# Patient Record
Sex: Male | Born: 1960 | Race: Black or African American | Hispanic: No | Marital: Single | Smoking: Current every day smoker
Health system: Southern US, Community
[De-identification: ages and names within clinical notes are randomized; demographics above are authoritative.]

## PROBLEM LIST (undated history)

## (undated) ENCOUNTER — Emergency Department (HOSPITAL_COMMUNITY): Discharge status: Absent without leave | Payer: Medicare Other

## (undated) DIAGNOSIS — S32009K Unspecified fracture of unspecified lumbar vertebra, subsequent encounter for fracture with nonunion: Secondary | ICD-10-CM

## (undated) DIAGNOSIS — E041 Nontoxic single thyroid nodule: Secondary | ICD-10-CM

## (undated) DIAGNOSIS — Z9889 Other specified postprocedural states: Secondary | ICD-10-CM

## (undated) DIAGNOSIS — M1990 Unspecified osteoarthritis, unspecified site: Secondary | ICD-10-CM

## (undated) DIAGNOSIS — E0590 Thyrotoxicosis, unspecified without thyrotoxic crisis or storm: Secondary | ICD-10-CM

## (undated) DIAGNOSIS — R112 Nausea with vomiting, unspecified: Secondary | ICD-10-CM

## (undated) DIAGNOSIS — M5412 Radiculopathy, cervical region: Secondary | ICD-10-CM

## (undated) DIAGNOSIS — F1911 Other psychoactive substance abuse, in remission: Secondary | ICD-10-CM

## (undated) DIAGNOSIS — M5416 Radiculopathy, lumbar region: Secondary | ICD-10-CM

## (undated) DIAGNOSIS — M4316 Spondylolisthesis, lumbar region: Secondary | ICD-10-CM

## (undated) DIAGNOSIS — H4050X0 Glaucoma secondary to other eye disorders, unspecified eye, stage unspecified: Secondary | ICD-10-CM

## (undated) DIAGNOSIS — F32A Depression, unspecified: Secondary | ICD-10-CM

## (undated) DIAGNOSIS — N410 Acute prostatitis: Secondary | ICD-10-CM

## (undated) DIAGNOSIS — I639 Cerebral infarction, unspecified: Secondary | ICD-10-CM

## (undated) DIAGNOSIS — F4321 Adjustment disorder with depressed mood: Secondary | ICD-10-CM

## (undated) DIAGNOSIS — I959 Hypotension, unspecified: Secondary | ICD-10-CM

## (undated) DIAGNOSIS — M169 Osteoarthritis of hip, unspecified: Secondary | ICD-10-CM

## (undated) DIAGNOSIS — C61 Malignant neoplasm of prostate: Secondary | ICD-10-CM

## (undated) DIAGNOSIS — M4802 Spinal stenosis, cervical region: Secondary | ICD-10-CM

## (undated) DIAGNOSIS — H548 Legal blindness, as defined in USA: Secondary | ICD-10-CM

## (undated) DIAGNOSIS — Z973 Presence of spectacles and contact lenses: Secondary | ICD-10-CM

## (undated) DIAGNOSIS — M1710 Unilateral primary osteoarthritis, unspecified knee: Secondary | ICD-10-CM

## (undated) DIAGNOSIS — H269 Unspecified cataract: Secondary | ICD-10-CM

## (undated) DIAGNOSIS — E785 Hyperlipidemia, unspecified: Secondary | ICD-10-CM

## (undated) DIAGNOSIS — I10 Essential (primary) hypertension: Secondary | ICD-10-CM

## (undated) DIAGNOSIS — H409 Unspecified glaucoma: Secondary | ICD-10-CM

## (undated) DIAGNOSIS — K219 Gastro-esophageal reflux disease without esophagitis: Secondary | ICD-10-CM

## (undated) DIAGNOSIS — I700 Atherosclerosis of aorta: Secondary | ICD-10-CM

## (undated) DIAGNOSIS — G8929 Other chronic pain: Secondary | ICD-10-CM

## (undated) DIAGNOSIS — Z972 Presence of dental prosthetic device (complete) (partial): Secondary | ICD-10-CM

## (undated) DIAGNOSIS — J302 Other seasonal allergic rhinitis: Secondary | ICD-10-CM

## (undated) HISTORY — DX: Spinal stenosis, cervical region: M48.02

## (undated) HISTORY — DX: Cerebral infarction, unspecified: I63.9

## (undated) HISTORY — DX: Osteoarthritis of hip, unspecified: M16.9

## (undated) HISTORY — DX: Nontoxic single thyroid nodule: E04.1

## (undated) HISTORY — DX: Hypotension, unspecified: I95.9

## (undated) HISTORY — DX: Unspecified fracture of unspecified lumbar vertebra, subsequent encounter for fracture with nonunion: S32.009K

## (undated) HISTORY — DX: Thyrotoxicosis, unspecified without thyrotoxic crisis or storm: E05.90

## (undated) HISTORY — DX: Hyperlipidemia, unspecified: E78.5

## (undated) HISTORY — DX: Adjustment disorder with depressed mood: F43.21

## (undated) HISTORY — DX: Unilateral primary osteoarthritis, unspecified knee: M17.10

## (undated) HISTORY — DX: Radiculopathy, cervical region: M54.12

## (undated) HISTORY — DX: Essential (primary) hypertension: I10

## (undated) HISTORY — PX: HAND SURGERY: SHX662

## (undated) HISTORY — DX: Unspecified cataract: H26.9

## (undated) HISTORY — DX: Acute prostatitis: N41.0

## (undated) HISTORY — PX: POSTERIOR LUMBAR FUSION: SHX6036

## (undated) HISTORY — DX: Glaucoma secondary to other eye disorders, unspecified eye, stage unspecified: H40.50X0

## (undated) HISTORY — PX: COLONOSCOPY: SHX174

## (undated) HISTORY — DX: Other psychoactive substance abuse, in remission: F19.11

---

## 1999-06-19 ENCOUNTER — Emergency Department (HOSPITAL_COMMUNITY): Admission: EM | Admit: 1999-06-19 | Discharge: 1999-06-19 | Payer: Self-pay | Admitting: Emergency Medicine

## 1999-11-23 ENCOUNTER — Emergency Department (HOSPITAL_COMMUNITY): Admission: EM | Admit: 1999-11-23 | Discharge: 1999-11-23 | Payer: Self-pay | Admitting: Emergency Medicine

## 1999-11-23 ENCOUNTER — Encounter: Payer: Self-pay | Admitting: Emergency Medicine

## 2000-08-16 ENCOUNTER — Emergency Department (HOSPITAL_COMMUNITY): Admission: EM | Admit: 2000-08-16 | Discharge: 2000-08-16 | Payer: Self-pay | Admitting: Emergency Medicine

## 2000-08-19 ENCOUNTER — Emergency Department (HOSPITAL_COMMUNITY): Admission: EM | Admit: 2000-08-19 | Discharge: 2000-08-19 | Payer: Self-pay | Admitting: Emergency Medicine

## 2003-07-03 ENCOUNTER — Encounter: Payer: Self-pay | Admitting: Emergency Medicine

## 2003-07-03 ENCOUNTER — Emergency Department (HOSPITAL_COMMUNITY): Admission: EM | Admit: 2003-07-03 | Discharge: 2003-07-03 | Payer: Self-pay | Admitting: Emergency Medicine

## 2003-07-05 ENCOUNTER — Encounter: Payer: Self-pay | Admitting: Otolaryngology

## 2003-07-05 ENCOUNTER — Inpatient Hospital Stay (HOSPITAL_COMMUNITY): Admission: AD | Admit: 2003-07-05 | Discharge: 2003-07-11 | Payer: Self-pay | Admitting: Otolaryngology

## 2003-07-07 ENCOUNTER — Encounter: Payer: Self-pay | Admitting: Otolaryngology

## 2003-07-08 ENCOUNTER — Encounter (INDEPENDENT_AMBULATORY_CARE_PROVIDER_SITE_OTHER): Payer: Self-pay | Admitting: Specialist

## 2003-07-08 ENCOUNTER — Encounter: Payer: Self-pay | Admitting: Otolaryngology

## 2003-07-11 ENCOUNTER — Encounter: Payer: Self-pay | Admitting: Otolaryngology

## 2006-09-13 HISTORY — PX: OTHER SURGICAL HISTORY: SHX169

## 2006-10-10 ENCOUNTER — Ambulatory Visit: Payer: Self-pay | Admitting: Internal Medicine

## 2006-10-10 ENCOUNTER — Inpatient Hospital Stay (HOSPITAL_COMMUNITY): Admission: EM | Admit: 2006-10-10 | Discharge: 2006-10-14 | Payer: Self-pay | Admitting: Pediatrics

## 2006-12-18 ENCOUNTER — Emergency Department (HOSPITAL_COMMUNITY): Admission: EM | Admit: 2006-12-18 | Discharge: 2006-12-18 | Payer: Self-pay | Admitting: Emergency Medicine

## 2007-08-03 ENCOUNTER — Ambulatory Visit: Payer: Self-pay | Admitting: Internal Medicine

## 2007-08-20 ENCOUNTER — Ambulatory Visit: Payer: Self-pay | Admitting: Internal Medicine

## 2007-08-20 ENCOUNTER — Encounter (INDEPENDENT_AMBULATORY_CARE_PROVIDER_SITE_OTHER): Payer: Self-pay | Admitting: Nurse Practitioner

## 2007-08-20 ENCOUNTER — Encounter: Payer: Self-pay | Admitting: Internal Medicine

## 2007-08-20 LAB — CONVERTED CEMR LAB
ALT: 9 units/L (ref 0–53)
AST: 12 units/L (ref 0–37)
BUN: 12 mg/dL (ref 6–23)
Basophils Absolute: 0 10*3/uL (ref 0.0–0.1)
Basophils Relative: 0 % (ref 0–1)
Calcium: 9.7 mg/dL (ref 8.4–10.5)
Creatinine, Ser: 0.99 mg/dL (ref 0.40–1.50)
Eosinophils Relative: 4 % (ref 0–5)
HCT: 47.5 % (ref 39.0–52.0)
Hemoglobin: 15.4 g/dL (ref 13.0–17.0)
MCHC: 32.4 g/dL (ref 30.0–36.0)
MCV: 97.9 fL (ref 78.0–100.0)
Monocytes Absolute: 0.6 10*3/uL (ref 0.2–0.7)
Monocytes Relative: 11 % (ref 3–11)
RBC: 4.85 M/uL (ref 4.22–5.81)
RDW: 12.9 % (ref 11.5–14.0)
TSH: 0.279 microintl units/mL — ABNORMAL LOW (ref 0.350–5.50)
Total Bilirubin: 0.5 mg/dL (ref 0.3–1.2)

## 2007-08-21 ENCOUNTER — Ambulatory Visit: Payer: Self-pay | Admitting: *Deleted

## 2007-08-21 ENCOUNTER — Ambulatory Visit (HOSPITAL_COMMUNITY): Admission: RE | Admit: 2007-08-21 | Discharge: 2007-08-21 | Payer: Self-pay | Admitting: Nurse Practitioner

## 2007-08-21 ENCOUNTER — Encounter: Payer: Self-pay | Admitting: Internal Medicine

## 2007-08-24 ENCOUNTER — Encounter (INDEPENDENT_AMBULATORY_CARE_PROVIDER_SITE_OTHER): Payer: Self-pay | Admitting: Nurse Practitioner

## 2007-08-24 LAB — CONVERTED CEMR LAB
Free Thyroxine Index: 2.7 (ref 1.0–3.9)
T3 Uptake Ratio: 49.5 % — ABNORMAL HIGH (ref 22.5–37.0)
T4, Total: 5.5 ug/dL (ref 5.0–12.5)

## 2007-09-14 DIAGNOSIS — F4321 Adjustment disorder with depressed mood: Secondary | ICD-10-CM

## 2007-09-14 HISTORY — DX: Adjustment disorder with depressed mood: F43.21

## 2007-11-12 ENCOUNTER — Encounter: Payer: Self-pay | Admitting: Internal Medicine

## 2007-11-12 ENCOUNTER — Ambulatory Visit: Payer: Self-pay | Admitting: Family Medicine

## 2007-11-12 ENCOUNTER — Encounter (INDEPENDENT_AMBULATORY_CARE_PROVIDER_SITE_OTHER): Payer: Self-pay | Admitting: Nurse Practitioner

## 2008-01-12 ENCOUNTER — Encounter (INDEPENDENT_AMBULATORY_CARE_PROVIDER_SITE_OTHER): Payer: Self-pay | Admitting: Nurse Practitioner

## 2008-01-12 ENCOUNTER — Encounter: Payer: Self-pay | Admitting: Internal Medicine

## 2008-01-12 ENCOUNTER — Ambulatory Visit: Payer: Self-pay | Admitting: Family Medicine

## 2008-01-12 LAB — CONVERTED CEMR LAB
ALT: 14 units/L (ref 0–53)
AST: 15 units/L (ref 0–37)
Albumin: 4.5 g/dL (ref 3.5–5.2)
Alkaline Phosphatase: 102 units/L (ref 39–117)
BUN: 13 mg/dL (ref 6–23)
Basophils Absolute: 0 10*3/uL (ref 0.0–0.1)
Basophils Relative: 0 % (ref 0–1)
Calcium: 9.1 mg/dL (ref 8.4–10.5)
Chloride: 104 meq/L (ref 96–112)
Eosinophils Absolute: 0.4 10*3/uL (ref 0.0–0.7)
MCHC: 32.8 g/dL (ref 30.0–36.0)
MCV: 96.6 fL (ref 78.0–100.0)
Monocytes Relative: 11 % (ref 3–12)
Neutro Abs: 3.5 10*3/uL (ref 1.7–7.7)
Neutrophils Relative %: 55 % (ref 43–77)
Platelets: 302 10*3/uL (ref 150–400)
Potassium: 5 meq/L (ref 3.5–5.3)
RBC: 4.76 M/uL (ref 4.22–5.81)
Sodium: 142 meq/L (ref 135–145)
Total Protein: 7.6 g/dL (ref 6.0–8.3)
WBC: 6.3 10*3/uL (ref 4.0–10.5)

## 2008-03-15 ENCOUNTER — Encounter (INDEPENDENT_AMBULATORY_CARE_PROVIDER_SITE_OTHER): Payer: Self-pay | Admitting: Nurse Practitioner

## 2008-03-15 ENCOUNTER — Ambulatory Visit: Payer: Self-pay | Admitting: Internal Medicine

## 2008-05-26 ENCOUNTER — Ambulatory Visit: Payer: Self-pay | Admitting: Internal Medicine

## 2008-08-30 ENCOUNTER — Ambulatory Visit: Payer: Self-pay | Admitting: Internal Medicine

## 2008-08-30 DIAGNOSIS — F4321 Adjustment disorder with depressed mood: Secondary | ICD-10-CM | POA: Insufficient documentation

## 2008-08-30 DIAGNOSIS — M5412 Radiculopathy, cervical region: Secondary | ICD-10-CM | POA: Insufficient documentation

## 2008-08-30 DIAGNOSIS — H4050X0 Glaucoma secondary to other eye disorders, unspecified eye, stage unspecified: Secondary | ICD-10-CM

## 2008-08-30 DIAGNOSIS — I10 Essential (primary) hypertension: Secondary | ICD-10-CM

## 2008-08-30 HISTORY — DX: Radiculopathy, cervical region: M54.12

## 2008-08-30 HISTORY — DX: Essential (primary) hypertension: I10

## 2008-08-30 HISTORY — DX: Glaucoma secondary to other eye disorders, unspecified eye, stage unspecified: H40.50X0

## 2008-08-30 LAB — CONVERTED CEMR LAB
Alkaline Phosphatase: 91 units/L (ref 39–117)
Basophils Absolute: 0 10*3/uL (ref 0.0–0.1)
Bilirubin Urine: NEGATIVE
Bilirubin, Direct: 0.1 mg/dL (ref 0.0–0.3)
CO2: 32 meq/L (ref 19–32)
Calcium: 9.3 mg/dL (ref 8.4–10.5)
GFR calc Af Amer: 103 mL/min
HDL: 44.3 mg/dL (ref >39.0)
Hemoglobin, Urine: NEGATIVE
Hemoglobin: 14.2 g/dL (ref 13.0–17.0)
Ketones, ur: NEGATIVE mg/dL
LDL Cholesterol: 97 mg/dL (ref 0–99)
Leukocytes, UA: NEGATIVE
Lymphocytes Relative: 37.1 % (ref 12.0–46.0)
MCHC: 33.4 g/dL (ref 30.0–36.0)
Monocytes Absolute: 0.6 10*3/uL (ref 0.1–1.0)
Neutro Abs: 2.3 10*3/uL (ref 1.4–7.7)
Nitrite: NEGATIVE
Platelets: 304 10*3/uL (ref 150–400)
Potassium: 4.3 meq/L (ref 3.5–5.1)
RDW: 14.1 % (ref 11.5–14.6)
Sodium: 141 meq/L (ref 135–145)
TSH: 0.55 microintl units/mL (ref 0.35–5.50)
Total Bilirubin: 0.5 mg/dL (ref 0.3–1.2)
Total CHOL/HDL Ratio: 3.6
Triglycerides: 84 mg/dL (ref 0–149)
VLDL: 17 mg/dL (ref 0–40)
pH: 5.5 (ref 5.0–8.0)

## 2008-10-14 DIAGNOSIS — Z87438 Personal history of other diseases of male genital organs: Secondary | ICD-10-CM

## 2008-10-14 HISTORY — DX: Personal history of other diseases of male genital organs: Z87.438

## 2009-01-30 ENCOUNTER — Ambulatory Visit: Payer: Self-pay | Admitting: Internal Medicine

## 2009-01-30 DIAGNOSIS — M25569 Pain in unspecified knee: Secondary | ICD-10-CM | POA: Insufficient documentation

## 2009-01-30 DIAGNOSIS — K1370 Unspecified lesions of oral mucosa: Secondary | ICD-10-CM

## 2009-05-07 ENCOUNTER — Emergency Department (HOSPITAL_COMMUNITY): Admission: EM | Admit: 2009-05-07 | Discharge: 2009-05-07 | Payer: Self-pay | Admitting: Emergency Medicine

## 2009-05-09 ENCOUNTER — Ambulatory Visit: Payer: Self-pay | Admitting: Internal Medicine

## 2009-05-09 DIAGNOSIS — IMO0002 Reserved for concepts with insufficient information to code with codable children: Secondary | ICD-10-CM | POA: Insufficient documentation

## 2009-05-09 DIAGNOSIS — M25559 Pain in unspecified hip: Secondary | ICD-10-CM

## 2009-05-12 ENCOUNTER — Telehealth: Payer: Self-pay | Admitting: Internal Medicine

## 2009-05-15 ENCOUNTER — Encounter: Admission: RE | Admit: 2009-05-15 | Discharge: 2009-05-15 | Payer: Self-pay | Admitting: Internal Medicine

## 2009-05-16 ENCOUNTER — Telehealth: Payer: Self-pay | Admitting: Internal Medicine

## 2009-05-17 ENCOUNTER — Telehealth: Payer: Self-pay | Admitting: Internal Medicine

## 2009-05-23 ENCOUNTER — Telehealth: Payer: Self-pay | Admitting: Internal Medicine

## 2009-05-23 ENCOUNTER — Ambulatory Visit: Payer: Self-pay | Admitting: Internal Medicine

## 2009-05-23 DIAGNOSIS — N410 Acute prostatitis: Secondary | ICD-10-CM

## 2009-05-23 HISTORY — DX: Acute prostatitis: N41.0

## 2009-05-23 LAB — CONVERTED CEMR LAB
Nitrite: NEGATIVE
Specific Gravity, Urine: 1.01 (ref 1.000–1.030)
pH: 8 (ref 5.0–8.0)

## 2009-05-24 ENCOUNTER — Encounter: Payer: Self-pay | Admitting: Internal Medicine

## 2009-06-07 ENCOUNTER — Encounter: Admission: RE | Admit: 2009-06-07 | Discharge: 2009-09-05 | Payer: Self-pay | Admitting: Orthopaedic Surgery

## 2009-07-31 ENCOUNTER — Ambulatory Visit: Payer: Self-pay | Admitting: Internal Medicine

## 2009-07-31 DIAGNOSIS — M549 Dorsalgia, unspecified: Secondary | ICD-10-CM | POA: Insufficient documentation

## 2009-08-01 ENCOUNTER — Encounter (INDEPENDENT_AMBULATORY_CARE_PROVIDER_SITE_OTHER): Payer: Self-pay | Admitting: *Deleted

## 2009-08-01 ENCOUNTER — Telehealth: Payer: Self-pay | Admitting: Internal Medicine

## 2009-08-04 ENCOUNTER — Encounter (INDEPENDENT_AMBULATORY_CARE_PROVIDER_SITE_OTHER): Payer: Self-pay | Admitting: *Deleted

## 2009-08-07 ENCOUNTER — Encounter: Admission: RE | Admit: 2009-08-07 | Discharge: 2009-08-07 | Payer: Self-pay | Admitting: Internal Medicine

## 2009-08-08 ENCOUNTER — Encounter: Payer: Self-pay | Admitting: Internal Medicine

## 2009-08-08 DIAGNOSIS — M4802 Spinal stenosis, cervical region: Secondary | ICD-10-CM

## 2009-08-08 HISTORY — DX: Spinal stenosis, cervical region: M48.02

## 2009-10-14 DIAGNOSIS — Z8639 Personal history of other endocrine, nutritional and metabolic disease: Secondary | ICD-10-CM

## 2009-10-14 HISTORY — DX: Personal history of other endocrine, nutritional and metabolic disease: Z86.39

## 2009-10-31 ENCOUNTER — Ambulatory Visit: Payer: Self-pay | Admitting: Internal Medicine

## 2009-10-31 DIAGNOSIS — I959 Hypotension, unspecified: Secondary | ICD-10-CM

## 2009-10-31 HISTORY — DX: Hypotension, unspecified: I95.9

## 2009-11-01 ENCOUNTER — Telehealth: Payer: Self-pay | Admitting: Internal Medicine

## 2009-11-01 LAB — CONVERTED CEMR LAB
Basophils Relative: 0.2 % (ref 0.0–3.0)
CO2: 31 meq/L (ref 19–32)
Calcium: 9.7 mg/dL (ref 8.4–10.5)
Creatinine, Ser: 1.4 mg/dL (ref 0.4–1.5)
Eosinophils Absolute: 0.3 10*3/uL (ref 0.0–0.7)
GFR calc non Af Amer: 69.06 mL/min (ref >60)
Lymphocytes Relative: 32.7 % (ref 12.0–46.0)
MCHC: 32.7 g/dL (ref 30.0–36.0)
Neutrophils Relative %: 53.8 % (ref 43.0–77.0)
RBC: 4.41 M/uL (ref 4.22–5.81)
WBC: 6.7 10*3/uL (ref 4.5–10.5)

## 2009-11-16 ENCOUNTER — Telehealth: Payer: Self-pay | Admitting: Internal Medicine

## 2009-11-17 ENCOUNTER — Telehealth: Payer: Self-pay | Admitting: Internal Medicine

## 2009-11-21 ENCOUNTER — Telehealth (INDEPENDENT_AMBULATORY_CARE_PROVIDER_SITE_OTHER): Payer: Self-pay | Admitting: *Deleted

## 2010-01-23 ENCOUNTER — Encounter: Payer: Self-pay | Admitting: Internal Medicine

## 2010-02-09 ENCOUNTER — Telehealth: Payer: Self-pay | Admitting: Internal Medicine

## 2010-02-09 ENCOUNTER — Encounter (INDEPENDENT_AMBULATORY_CARE_PROVIDER_SITE_OTHER): Payer: Self-pay | Admitting: *Deleted

## 2010-02-09 ENCOUNTER — Ambulatory Visit: Payer: Self-pay | Admitting: Internal Medicine

## 2010-02-09 LAB — CONVERTED CEMR LAB
ALT: 11 units/L (ref 0–53)
Albumin: 3.7 g/dL (ref 3.5–5.2)
BUN: 10 mg/dL (ref 6–23)
Basophils Relative: 0.6 % (ref 0.0–3.0)
CO2: 31 meq/L (ref 19–32)
Chloride: 103 meq/L (ref 96–112)
Cholesterol: 128 mg/dL (ref 0–200)
Creatinine, Ser: 1 mg/dL (ref 0.4–1.5)
Eosinophils Absolute: 0.3 10*3/uL (ref 0.0–0.7)
Glucose, Bld: 82 mg/dL (ref 70–99)
Hemoglobin, Urine: NEGATIVE
Hemoglobin: 13.4 g/dL (ref 13.0–17.0)
Lymphocytes Relative: 22.1 % (ref 12.0–46.0)
MCHC: 32.9 g/dL (ref 30.0–36.0)
Monocytes Relative: 11 % (ref 3.0–12.0)
Neutro Abs: 3.6 10*3/uL (ref 1.4–7.7)
Nitrite: NEGATIVE
RBC: 4.38 M/uL (ref 4.22–5.81)
Total Protein, Urine: NEGATIVE mg/dL
Total Protein: 7.1 g/dL (ref 6.0–8.3)
Triglycerides: 59 mg/dL (ref 0.0–149.0)
pH: 7 (ref 5.0–8.0)

## 2010-02-13 ENCOUNTER — Ambulatory Visit: Payer: Self-pay | Admitting: Internal Medicine

## 2010-02-13 ENCOUNTER — Encounter (INDEPENDENT_AMBULATORY_CARE_PROVIDER_SITE_OTHER): Payer: Self-pay | Admitting: *Deleted

## 2010-02-13 DIAGNOSIS — E0590 Thyrotoxicosis, unspecified without thyrotoxic crisis or storm: Secondary | ICD-10-CM | POA: Insufficient documentation

## 2010-02-13 HISTORY — DX: Thyrotoxicosis, unspecified without thyrotoxic crisis or storm: E05.90

## 2010-02-20 ENCOUNTER — Telehealth (INDEPENDENT_AMBULATORY_CARE_PROVIDER_SITE_OTHER): Payer: Self-pay | Admitting: *Deleted

## 2010-02-26 ENCOUNTER — Encounter (INDEPENDENT_AMBULATORY_CARE_PROVIDER_SITE_OTHER): Payer: Self-pay | Admitting: *Deleted

## 2010-03-01 ENCOUNTER — Encounter (INDEPENDENT_AMBULATORY_CARE_PROVIDER_SITE_OTHER): Payer: Self-pay | Admitting: *Deleted

## 2010-03-02 ENCOUNTER — Ambulatory Visit: Payer: Self-pay | Admitting: Endocrinology

## 2010-03-02 DIAGNOSIS — E041 Nontoxic single thyroid nodule: Secondary | ICD-10-CM

## 2010-03-02 HISTORY — DX: Nontoxic single thyroid nodule: E04.1

## 2010-03-05 ENCOUNTER — Ambulatory Visit: Payer: Self-pay | Admitting: Gastroenterology

## 2010-03-14 ENCOUNTER — Ambulatory Visit: Payer: Self-pay | Admitting: Gastroenterology

## 2010-03-15 ENCOUNTER — Encounter: Payer: Self-pay | Admitting: Gastroenterology

## 2010-05-21 ENCOUNTER — Telehealth (INDEPENDENT_AMBULATORY_CARE_PROVIDER_SITE_OTHER): Payer: Self-pay | Admitting: *Deleted

## 2010-05-21 ENCOUNTER — Ambulatory Visit: Payer: Self-pay | Admitting: Internal Medicine

## 2010-05-30 ENCOUNTER — Encounter (HOSPITAL_COMMUNITY): Admission: RE | Admit: 2010-05-30 | Discharge: 2010-07-11 | Payer: Self-pay | Admitting: Endocrinology

## 2010-06-20 ENCOUNTER — Telehealth: Payer: Self-pay | Admitting: Endocrinology

## 2010-06-20 ENCOUNTER — Encounter (INDEPENDENT_AMBULATORY_CARE_PROVIDER_SITE_OTHER): Payer: Self-pay | Admitting: *Deleted

## 2010-06-20 ENCOUNTER — Ambulatory Visit (HOSPITAL_COMMUNITY): Admission: RE | Admit: 2010-06-20 | Discharge: 2010-06-20 | Payer: Self-pay | Admitting: Endocrinology

## 2010-07-03 ENCOUNTER — Ambulatory Visit: Payer: Self-pay | Admitting: Endocrinology

## 2010-07-03 DIAGNOSIS — R109 Unspecified abdominal pain: Secondary | ICD-10-CM | POA: Insufficient documentation

## 2010-07-03 LAB — CONVERTED CEMR LAB
ALT: 11 units/L (ref 0–53)
Amylase: 99 units/L (ref 27–131)
BUN: 16 mg/dL (ref 6–23)
Basophils Absolute: 0 10*3/uL (ref 0.0–0.1)
Bilirubin, Direct: 0.1 mg/dL (ref 0.0–0.3)
Chloride: 98 meq/L (ref 96–112)
Creatinine, Ser: 1.1 mg/dL (ref 0.4–1.5)
Eosinophils Absolute: 0.4 10*3/uL (ref 0.0–0.7)
Eosinophils Relative: 5.8 % — ABNORMAL HIGH (ref 0.0–5.0)
Glucose, Bld: 82 mg/dL (ref 70–99)
MCV: 91.7 fL (ref 78.0–100.0)
Monocytes Absolute: 0.9 10*3/uL (ref 0.1–1.0)
Neutrophils Relative %: 57.2 % (ref 43.0–77.0)
Platelets: 417 10*3/uL — ABNORMAL HIGH (ref 150.0–400.0)
RDW: 14.9 % — ABNORMAL HIGH (ref 11.5–14.6)
TSH: 0.1 microintl units/mL — ABNORMAL LOW (ref 0.35–5.50)
Total Bilirubin: 0.4 mg/dL (ref 0.3–1.2)
WBC: 6.9 10*3/uL (ref 4.5–10.5)

## 2010-07-17 ENCOUNTER — Telehealth: Payer: Self-pay | Admitting: Internal Medicine

## 2010-08-02 ENCOUNTER — Telehealth: Payer: Self-pay | Admitting: Endocrinology

## 2010-08-02 ENCOUNTER — Ambulatory Visit: Payer: Self-pay | Admitting: Endocrinology

## 2010-08-02 LAB — CONVERTED CEMR LAB
Free T4: 1.25 ng/dL (ref 0.60–1.60)
TSH: 0.04 microintl units/mL — ABNORMAL LOW (ref 0.35–5.50)

## 2010-08-03 ENCOUNTER — Encounter (INDEPENDENT_AMBULATORY_CARE_PROVIDER_SITE_OTHER): Payer: Self-pay | Admitting: *Deleted

## 2010-08-03 ENCOUNTER — Telehealth: Payer: Self-pay | Admitting: Endocrinology

## 2010-10-11 ENCOUNTER — Telehealth: Payer: Self-pay | Admitting: Internal Medicine

## 2010-10-18 ENCOUNTER — Ambulatory Visit: Admit: 2010-10-18 | Payer: Self-pay | Admitting: Endocrinology

## 2010-10-26 ENCOUNTER — Telehealth: Payer: Self-pay | Admitting: Endocrinology

## 2010-11-04 ENCOUNTER — Encounter: Payer: Self-pay | Admitting: Internal Medicine

## 2010-11-15 NOTE — Progress Notes (Signed)
Summary: Records request from [facility]  Request for records received from [facility]. Request forwarded to [facility]. Mark Dunlap  November 21, 2009 12:14 PM

## 2010-11-15 NOTE — Letter (Signed)
Summary: Med Hx forms/[facility]  Med Hx forms/[facility]   Imported By: Sherian Rein 01/29/2010 08:17:35  _____________________________________________________________________  External Attachment:    Type:   Image     Comment:   External Document

## 2010-11-15 NOTE — Progress Notes (Signed)
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 Initial call taken by: Margaret Pyle, CMA,  February 09, 2010 10:00 AM

## 2010-11-15 NOTE — Progress Notes (Signed)
Summary: med ?  Phone Note From Other Clinic Call back at [phone]   Caller: Dr. Chinita Greenland Psychiatrist Summary of Call: Dr. Chinita Greenland wants to know if she can increase pt's rx for Methimazole to 20mg  three times a day. Per her lab results, pt's TSH is 0.104 and T4 is 1.34 Initial call taken by: Brenton Grills CMA Duncan Dull),  October 26, 2010 11:51 AM  Follow-up for Phone Call        pt is due for f/u appt here.  can pt come here? Follow-up by: Minus Breeding MD,  October 26, 2010 12:14 PM  Additional Follow-up for Phone Call Additional follow up Details #1::        per mother, pt is in a psychiatric hosp. Additional Follow-up by: Brenton Grills CMA Duncan Dull),  October 31, 2010 11:55 AM    Additional Follow-up for Phone Call Additional follow up Details #2::    please increase.  f/u here after discharge Follow-up by: Minus Breeding MD,  October 31, 2010 12:21 PM  Additional Follow-up for Phone Call Additional follow up Details #3:: Details for Additional Follow-up Action Taken: pt's psychiatrist informed via detailed voicemail Additional Follow-up by: Brenton Grills CMA Duncan Dull),  November 01, 2010 8:26 AM

## 2010-11-15 NOTE — Letter (Signed)
Summary: Out of Work  [facility]  [address] [place], [place] [address]   Phone: [phone]  Fax: [phone]    June 20, 2010   Employee:  Mark Dunlap    To Whom It May Concern:   For Medical reasons, please excuse the above named employee from work for the following dates:  Start:   Wednesday Sept 7th 2011  End:   Sunday Sept 11th 2011 - To return Sept 12th 2011  If you need additional information, please feel free to contact our office.         Sincerely,    ______________________ Romero Belling MD

## 2010-11-15 NOTE — Progress Notes (Signed)
----   Converted from flag ---- ---- 02/19/2010 11:31 AM, Ivar Bury wrote: Gave pt appt:  03/02/10 @ 4p w/Dr Allie Dimmer  ---- 02/13/2010 11:20 AM, Dagoberto Reef wrote: Please schedule with Dr Everardo All.   Thanks ------------------------------

## 2010-11-15 NOTE — Assessment & Plan Note (Signed)
Summary: EC3/6WK FOLLOW UP/JSS   Vital Signs:  Patient profile:   50 year old male Height:      73.5 inches (186.69 cm) Weight:      160.25 pounds (72.84 kg) BMI:     20.93 O2 Sat:      99 % on Room air Temp:     97.4 degrees F (36.33 degrees C) oral Pulse rate:   82 / minute BP sitting:   114 / 78  (left arm) Cuff size:   regular  Vitals Entered By: Brenton Grills MA (July 03, 2010 4:30 PM)  O2 Flow:  Room air CC: 6 week F/U/pt c/o nausea and vomiting before and after radiation treatment for thyroid/aj   Primary Provider:  Corwin Levins MD  CC:  6 week F/U/pt c/o nausea and vomiting before and after radiation treatment for thyroid/aj.  History of Present Illness: pt is now 2 weeks s/p i-131 rx for hyperthyroidism, due to a multinodular goiter.  he says the thyroid is less prominent now.   pt states few weeks of slight "sensation," at the epigastric area, and assoc nausea.    Current Medications (verified): 1)  Lisinopril-Hydrochlorothiazide 20-25 Mg Tabs (Lisinopril-Hydrochlorothiazide) .[name].. 1 By Mouth Once Daily 2)  Naproxen 500 Mg Tabs (Naproxen) .[name].. 1 By Mouth Two Times A Day As Needed Pain 3)  Adult Aspirin Ec Low Strength 81 Mg Tbec (Aspirin) .[name].. 1 By Mouth Once Daily 4)  Hydrocodone-Acetaminophen 10-325 Mg Tabs (Hydrocodone-Acetaminophen) .[name].. 1 By Mouth Four Times Per Day As Needed - To Fill May 15, 2010 5)  Cetirizine Hcl 10 Mg Tabs (Cetirizine Hcl) .[name].. 1po Once Daily As Needed Allergies 6)  Fluticasone Propionate 50 Mcg/act Susp (Fluticasone Propionate) .... 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    Physical Exam  General:  normal appearance.   Neck:  no change in large right thyroid mass Abdomen:  abdomen is soft, nontender.  no hepatosplenomegaly.   not distended.  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 1 tab once daily  Other Orders: TLB-TSH (Thyroid Stimulating Hormone) (84443-TSH) TLB-T4 (Thyrox), Free [phone]) TLB-BMP (Basic Metabolic Panel-BMET) (80048-METABOL) TLB-CBC Platelet - w/Differential (85025-CBCD) TLB-Hepatic/Liver Function Pnl (80076-HEPATIC) TLB-Amylase (82150-AMYL) Est. Patient Level IV (40981)  Patient Instructions: 1)  blood tests are being ordered for you today.  please call [phone] to hear your test results. 2)  Please schedule a follow-up appointment in 1 month. 3)  try to minimize naproxen 4)  here are some samples of dexilant 60 mg once daily.   5)  please see dr Jonny Ruiz soon. 6)  (update: i left message on phone-tree:  rx as we discussed)

## 2010-11-15 NOTE — Procedures (Signed)
Summary: Colonoscopy  Patient: [name] Note: All result statuses are Final unless otherwise noted.  Tests: (1) Colonoscopy (COL)   COL Colonoscopy           DONE     [facility]     [address]     [place], [place]  [address]           COLONOSCOPY PROCEDURE REPORT           PATIENT:  Mark, Dunlap  MR#:  [id number]     BIRTHDATE:  [birth year], 49 yrs. old  GENDER:  male     ENDOSCOPIST:  Judie Petit T. Russella Dar, MD, [facility]     Referred by:  Oliver Barre, M.D.     PROCEDURE DATE:  03/14/2010     PROCEDURE:  Colonoscopy with snare polypectomy     ASA CLASS:  Class II     INDICATIONS:  1) Routine Risk Screening     MEDICATIONS:   Fentanyl 100 mcg IV, Versed 8 mg IV     DESCRIPTION OF PROCEDURE:   After the risks benefits and     alternatives of the procedure were thoroughly explained, informed     consent was obtained.  Digital rectal exam was performed and     revealed no abnormalities.   The LB [id number]-[id number] endoscope     was introduced through the anus and advanced to the cecum, which     was identified by both the appendix and ileocecal valve, without     limitations.  The quality of the prep was excellent, using     MoviPrep.  The instrument was then slowly withdrawn as the colon     was fully examined.     <<PROCEDUREIMAGES>>     FINDINGS:  A pedunculated polyp was found in the sigmoid colon. It     was 8 mm in size. Polyp was snared, then cauterized with monopolar     cautery. Retrieval was successful. snare polyp  A normal appearing     cecum, ileocecal valve, and appendiceal orifice were identified.     The ascending, hepatic flexure, transverse, splenic flexure,     descending colon, and rectum appeared unremarkable. Retroflexed     views in the rectum revealed no abnormalities.    The time to     cecum =  3  minutes. The scope was then withdrawn (time =  12.67     min) from the patient and the procedure completed.           COMPLICATIONS:  None             ENDOSCOPIC IMPRESSION:     1) 8 mm pedunculated polyp in the sigmoid colon           RECOMMENDATIONS:     1) No aspirin or NSAID's for 2 weeks     2) Await pathology results     3) If the polyp removed today is adenomatous (pre-cancerous),     you will need a repeat colonoscopy in 3-5 years. Otherwise you     should continue to follow colorectal cancer screening guidelines     for "routine risk" patients with colonoscopy in 10 years.     Venita Lick. Russella Dar, MD, [facility]           n.     eSIGNED:   Venita Lick. Yachet Mattson at 03/14/2010 10:27 AM           Janace Litten, [id number]  Note: An exclamation mark (!) indicates a result that was not dispersed into the flowsheet. Document Creation Date: 03/14/2010 10:28 AM _______________________________________________________________________  (1) Order result status: Final Collection or observation date-time: 03/14/2010 10:22 Requested date-time:  Receipt date-time:  Reported date-time:  Referring Physician:   Ordering Physician: Claudette Head [phone]) Specimen Source:  Source: Launa Grill Order Number: [id number] Lab site:   Appended Document: Colonoscopy     Procedures Next Due Date:    Colonoscopy: 03/2015

## 2010-11-15 NOTE — Miscellaneous (Signed)
Summary: screening colon/previsit/rm  Clinical Lists Changes  Medications: Added new medication of MOVIPREP 100 GM  SOLR (PEG-KCL-NACL-NASULF-NA ASC-C) As per prep instructions. - Signed Rx of MOVIPREP 100 GM  SOLR (PEG-KCL-NACL-NASULF-NA ASC-C) As per prep instructions.;  #1 x 0;  Signed;  Entered by: Sherren Kerns RN;  Authorized by: Meryl Dare MD [facility];  Method used: [facility]  [address] [phone]*, [address] [place], [place], [place]  [address], Ph: [phone] or [phone], Fax: [phone] Observations: Added new observation of ALLERGY REV: Done (03/05/2010 15:43)    Prescriptions: MOVIPREP 100 GM  SOLR (PEG-KCL-NACL-NASULF-NA ASC-C) As per prep instructions.  #1 x 0   Entered by:   Sherren Kerns RN   Authorized by:   Meryl Dare MD [facility]   Signed by:   Sherren Kerns RN on 03/05/2010   Method used:   [facility]        [facility] [address] [facility] [address] [phone]* (retail)       [address]       [place], [place]  [address]       Ph: [phone] or [phone]       Fax: [phone]   RxID:   [id number]

## 2010-11-15 NOTE — Letter (Signed)
Summary: Patient Notice- Polyp Results  [facility]  [address] [place], [place] [address]   Phone: [phone]  Fax: [phone]        March 15, 2010 MRN: [id number]    Mark Dunlap [address] [place], [place]  [address]    Dear Mr. Castrogiovanni,  I am pleased to inform you that the colon polyp(s) removed during your recent colonoscopy was (were) found to be benign (no cancer detected) upon pathologic examination.  I recommend you have a repeat colonoscopy examination in 5 years to look for recurrent polyps, as having colon polyps increases your risk for having recurrent polyps or even colon cancer in the future.  Should you develop new or worsening symptoms of abdominal pain, bowel habit changes or bleeding from the rectum or bowels, please schedule an evaluation with either your primary care physician or with me.  Continue treatment plan as outlined the day of your exam.  Please call us if you are having persistent problems or have questions about your condition that have not been fully answered at this time.  Sincerely,  Meryl Dare MD [facility]  This letter has been electronically signed by your physician.  Appended Document: Patient Notice- Polyp Results letter mailed.

## 2010-11-15 NOTE — Progress Notes (Signed)
  Phone Note Call from Patient Call back at Home Phone [phone]   Caller: [phone] Call For: Corwin Levins MD Summary of Call: Pt requesting sample of meds for nausea, to prevent from vomiting. Please advise. Initial call taken by: Verdell Face,  October 11, 2010 3:43 PM  Follow-up for Phone Call        sorry, there are no samples of phenergan or zofran here  I can do rx -   Pt should consider OV if not improved, or has fever, vomiting, abd pain , or any type of blood loss Follow-up by: Corwin Levins MD,  October 11, 2010 4:47 PM  Additional Follow-up for Phone Call Additional follow up Details #1::        Pt advised  Additional Follow-up by: Margaret Pyle, CMA,  October 11, 2010 5:01 PM    New/Updated Medications: PROMETHAZINE HCL 25 MG TABS (PROMETHAZINE HCL) 1 by mouth q 6 hrs as needed nausea Prescriptions: PROMETHAZINE HCL 25 MG TABS (PROMETHAZINE HCL) 1 by mouth q 6 hrs as needed nausea  #40 x 1   Entered and Authorized by:   Corwin Levins MD   Signed by:   Corwin Levins MD on 10/11/2010   Method used:   [facility]        [facility] [address] [phone]* (retail)       [address]       [place], [place]  [address]       Ph: [phone] or [phone]       Fax: [phone]   RxID:   [id number]

## 2010-11-15 NOTE — Letter (Signed)
Summary: Out of Work  [facility]  [address] [place], [place] [address]   Phone: [phone]  Fax: [phone]    Mar 02, 2010   Employee:  Mark Dunlap    To Whom It May Concern:   For Medical reasons, please excuse the above named employee from work for the following dates:  03/02/10, from 3 pm.     Sincerely,    Minus Breeding MD

## 2010-11-15 NOTE — Progress Notes (Signed)
  Phone Note Call from Patient Call back at Home Phone [phone]   Caller: Patient Summary of Call: Pt is requesting something to help him sleep throughout the night-please advise Initial call taken by: Brenton Grills MA,  August 02, 2010 4:48 PM  Follow-up for Phone Call        i have adressed this at [facility] today. Follow-up by: Minus Breeding MD,  August 02, 2010 5:11 PM

## 2010-11-15 NOTE — Assessment & Plan Note (Signed)
Summary: low bp/dr Jonny Ruiz pt/cd   Vital Signs:  Patient profile:   50 year old male Height:      73.5 inches (186.69 cm) Weight:      161.0 pounds (73.18 kg) O2 Sat:      98 % on Room air Temp:     98.3 degrees F (36.83 degrees C) oral Pulse rate:   102 / minute BP sitting:   88 / 58  (left arm) Cuff size:   regular  Vitals Entered By: Orlan Leavens RMA (May 21, 2010 1:53 PM)  O2 Flow:  Room air CC: Low BP Is Patient Diabetic? No Pain Assessment Patient in pain? no      Comments Pt states begin to feel dizzy at work. They checked BP 74/50 @ 11:50. Then check @ 12:30 it was 77/64. Pt also complaining of no appetite   Primary Care Provider:  Corwin Levins MD  CC:  Low BP.  History of Present Illness:  here today with complaint of low blood pressure readings onset of symptoms was this AM (<6 hours ago). course has been sudden onset and now occurs in persisitng pattern. symptom characterized as dizzy feeling every since waking up - problem associated with feeling light heded but not associated with syncope, HA, or CP. symptoms improved by sitting and resting. symptoms worsened with activity such as standing. + prior hx of same symptoms - seen by me for same 10/2009.  + taking narcs at this time - also daily NSAIDs for OA denies dehydration such as vomitting or diarrhea  - no black or liquid stools +"head cold" symptoms x 48h - drank whole bottle of robitussin in last 24h   Clinical Review Panels:  Lipid Management   Cholesterol:  128 (02/09/2010)   LDL (bad choesterol):  81 (02/09/2010)   HDL (good cholesterol):  34.90 (02/09/2010)  CBC   WBC:  6.0 (02/09/2010)   RBC:  4.38 (02/09/2010)   Hgb:  13.4 (02/09/2010)   Hct:  40.7 (02/09/2010)   Platelets:  363.0 (02/09/2010)   MCV  92.9 (02/09/2010)   MCHC  32.9 (02/09/2010)   RDW  13.9 (02/09/2010)   PMN:  60.7 (02/09/2010)   Lymphs:  22.1 (02/09/2010)   Monos:  11.0 (02/09/2010)   Eosinophils:  5.6 (02/09/2010)   Basophil:  0.6 (02/09/2010)  Complete Metabolic Panel   Glucose:  82 (02/09/2010)   Sodium:  139 (02/09/2010)   Potassium:  4.8 (02/09/2010)   Chloride:  103 (02/09/2010)   CO2:  31 (02/09/2010)   BUN:  10 (02/09/2010)   Creatinine:  1.0 (02/09/2010)   Albumin:  3.7 (02/09/2010)   Total Protein:  7.1 (02/09/2010)   Calcium:  9.5 (02/09/2010)   Total Bili:  0.8 (02/09/2010)   Alk Phos:  95 (02/09/2010)   SGPT (ALT):  11 (02/09/2010)   SGOT (AST):  15 (02/09/2010)   Current Medications (verified): 1)  Lisinopril-Hydrochlorothiazide 20-25 Mg Tabs (Lisinopril-Hydrochlorothiazide) .[name].. 1 By Mouth Once Daily 2)  Naproxen 500 Mg Tabs (Naproxen) .[name].. 1 By Mouth Two Times A Day As Needed Pain 3)  Adult Aspirin Ec Low Strength 81 Mg Tbec (Aspirin) .[name].. 1 By Mouth Once Daily 4)  Hydrocodone-Acetaminophen 10-325 Mg Tabs (Hydrocodone-Acetaminophen) .[name].. 1 By Mouth Four Times Per Day As Needed - To Fill May 15, 2010 5)  Cetirizine Hcl 10 Mg Tabs (Cetirizine Hcl) .[name].. 1po Once Daily As Needed Allergies 6)  Fluticasone Propionate 50 Mcg/act Susp (Fluticasone Propionate) .... 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    Physical Exam  General:  alert and well-developed but thin - nontoxic Lungs:  normal respiratory effort and normal breath sounds.   Heart:  normal rate and regular rhythm.  no edema Abdomen:  soft, non-tender, and normal bowel sounds.   Psych:  not depressed appearing and mildly anxious.     Impression & Recommendations:  Problem # 1:  HYPOTENSION (ICD-458.9) hx same - suspect overtx HTN in setting of URI - no CP, abd  pain, N/V or toxic findings on exam- no anemia on recent labs, denies melena or "red flags" on hx rec holding BP med x 72 h - aggressive oral hydration - also to use less pain pills as tol - narcs may also contrib to his hypotension to call if symptoms worse, sooner if probs - ok return to work in AM if dizzy symptoms improved -  Problem # 2:  HYPERTENSION (ICD-401.9)  His updated medication list for this problem includes:    Lisinopril-hydrochlorothiazide 20-25 Mg Tabs (Lisinopril-hydrochlorothiazide) .[name]... 1 by mouth once daily  BP today: 88/58 Prior BP: 136/90 (03/02/2010)  Labs Reviewed: K+: 4.8 (02/09/2010) Creat: : 1.0 (02/09/2010)   Chol: 128 (02/09/2010)   HDL: 34.90 (02/09/2010)   LDL: 81 (02/09/2010)   TG: 59.0 (02/09/2010) Time spent with patient 25 minutes, more than 50% of this time was spent counseling patient on hydration and effects of rx+otc meds as related to BP control  Complete Medication List: 1)  Lisinopril-hydrochlorothiazide 20-25 Mg Tabs (Lisinopril-hydrochlorothiazide) .[name].. 1 by mouth once daily 2)  Naproxen 500 Mg Tabs (Naproxen) .[name].. 1 by mouth two times a day as needed pain 3)  Adult Aspirin Ec Low Strength 81 Mg Tbec (Aspirin) .[name].. 1 by mouth once daily 4)  Hydrocodone-acetaminophen 10-325 Mg Tabs (Hydrocodone-acetaminophen) .[name].. 1 by mouth four times per day as needed - to fill May 15, 2010 5)  Cetirizine Hcl 10 Mg Tabs (Cetirizine hcl) .[name].. 1po once daily as needed allergies 6)  Fluticasone Propionate 50 Mcg/act Susp (Fluticasone propionate) .... 2 spray/side once daily  Patient Instructions: 1)  stop your blood pressure medicine for 3 days - may start again on Friday AM if you are feeling normal - 2)  ok to go back to work tomorrow if you are feeling better 3)  drink fluids tonight such as water or juice as discussed, no alcohol or caffiene 4)  use only 1/2 tab of your pain pill as needed - too much pain medication can lower your blood pressure 5)  if  continued symptoms of dizziness, call for re-evaluation with dr. Jonny Ruiz

## 2010-11-15 NOTE — Progress Notes (Signed)
Summary: work note  Phone Note Call from Patient   Caller: Patient---[phone] Call For: Dr Everardo All Summary of Call: Pt needs a note for work to say he was here to see Dr Everardo All. Pt was here at 10/20 @3 :45pm,. note needs to say that he came for appt this time. Initial call taken by: Verdell Face,  August 03, 2010 10:26 AM  Follow-up for Phone Call        pt informed work note in cabinet ready for pickup Follow-up by: Brenton Grills MA,  August 03, 2010 10:57 AM

## 2010-11-15 NOTE — Assessment & Plan Note (Signed)
Summary: 1 MO ROV /NWS  #   Vital Signs:  Patient profile:   50 year old male Height:      73.5 inches (186.69 cm) Weight:      155.50 pounds (70.68 kg) BMI:     20.31 O2 Sat:      98 % on Room air Temp:     98.5 degrees F (36.94 degrees C) oral Pulse rate:   85 / minute BP sitting:   122 / 78  (left arm) Cuff size:   regular  Vitals Entered By: Brenton Grills MA (August 02, 2010 4:48 PM)  O2 Flow:  Room air CC: 1 month F/U/aj Is Patient Diabetic? No   Primary Provider:  Corwin Levins MD  CC:  1 month F/U/aj.  History of Present Illness: pt is now 6 weeks s/p i-131 rx for hyperthyroidism, due to a multinodular goiter.  he is frustrated by the slow progress of the response to the i-131 rx. he says weight loss persists, and assoc insomomnia, but no tremor of the hands.   Current Medications (verified): 1)  Lisinopril-Hydrochlorothiazide 20-25 Mg Tabs (Lisinopril-Hydrochlorothiazide) .[name].. 1 By Mouth Once Daily 2)  Naproxen 500 Mg Tabs (Naproxen) .[name].. 1 By Mouth Two Times A Day As Needed Pain 3)  Adult Aspirin Ec Low Strength 81 Mg Tbec (Aspirin) .[name].. 1 By Mouth Once Daily 4)  Hydrocodone-Acetaminophen 10-325 Mg Tabs (Hydrocodone-Acetaminophen) .[name].. 1 By Mouth Four Times Per Day As Needed - To Fill May 15, 2010 5)  Cetirizine Hcl 10 Mg Tabs (Cetirizine Hcl) .[name].. 1po Once Daily As Needed Allergies 6)  Fluticasone Propionate 50 Mcg/act Susp (Fluticasone Propionate) .... 2 Spray/side Once Daily 7)  Dexilant 60 Mg Cpdr (Dexlansoprazole) .[name].. 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 Single no children work Administrator, sports for  the blind Current Smoker Alcohol use-no Drug use-no  Review of Systems  The patient denies fever.         denies n/v.    Physical Exam  General:  normal appearance.   Neck:  no change in large right thyroid mass   Impression & Recommendations:  Problem # 1:  weight loss prob due to #1  Problem # 2:  HYPERTHYROIDISM (ICD-242.90) i explained to pt the time factor of i-131 therapy.   Problem # 3:  insomnia prob due to or exac by #2  Medications Added to Medication List This Visit: 1)  Trazodone Hcl 150 Mg Tabs (Trazodone hcl) .[name].. 1 taqb at bedtime as needed for sleep 2)  Methimazole 10 Mg Tabs (Methimazole) .... 2 tabs two times a day  Other Orders: TLB-TSH (Thyroid Stimulating Hormone) (84443-TSH) TLB-T4 (Thyrox), Free [phone]) Est. Patient Level IV (40981)  Patient Instructions: 1)  blood tests are being ordered for you today.  please call [phone] to hear your test results. 2)  take trazodone 150 mg at bedtime as needed for sleep.   3)  Please schedule a follow-up appointment in 1 month. 4)  take methimazole 2x10 mg two times a day.  this is a faster-acting thyroid pill, and will work faster than the radioactive iodine pill.   5)  if ever you have fever while taking this medication, stop it and call us, because of the risk of a rare side-effect 6)  here are some samples of "nexium" (similar to dexilant--take once daily). Prescriptions: METHIMAZOLE 10 MG TABS (METHIMAZOLE) 2 tabs two times a day  #120 x 1   Entered and Authorized by:   Minus Breeding MD   Signed by:   Minus Breeding MD on 08/02/2010   Method used:   [facility]        [facility] [address] [phone]* (retail)       [address]       [place], [place]  [address]       Ph: [phone] or [phone]       Fax: [phone]   RxID:   [id number] TRAZODONE HCL 150 MG TABS (TRAZODONE HCL) 1 taqb at bedtime as needed for sleep  #30 x 2   Entered and Authorized by:    Minus Breeding MD   Signed by:   Minus Breeding MD on 08/02/2010   Method used:   [facility]        [facility]  [address] [phone]* (retail)       [address]       [place], [place]  [address]       Ph: [phone] or [phone]       Fax: [phone]   RxID:   [id number]    Orders Added: 1)  TLB-TSH (Thyroid Stimulating Hormone) [84443-TSH] 2)  TLB-T4 (Thyrox), Free [37628-BT5V] 3)  Est. Patient Level IV [76160]

## 2010-11-15 NOTE — Progress Notes (Signed)
  Phone Note Call from Patient   Caller: Patient [phone] cell Summary of Call: Pt called stating that he has been having trouble slepping and bought Advil PM. Pt wants to know if this is okay to take with HTN, thyroid disfunction and glucoma? Initial call taken by: Margaret Pyle, CMA,  July 17, 2010 9:35 AM  Follow-up for Phone Call        should be ok to use off and on, but not every night as this can in a few persons affect the kidney funciton adn possibly the HTN as well, besides the risk of stomach irritaiton as well  Follow-up by: Corwin Levins MD,  July 17, 2010 9:43 AM  Additional Follow-up for Phone Call Additional follow up Details #1::        Pt advised and states he will only use medication 3 qwk Additional Follow-up by: Margaret Pyle, CMA,  July 17, 2010 10:34 AM

## 2010-11-15 NOTE — Letter (Signed)
Summary: Moviprep Instructions  [facility]  [address]   [place], [place] [address]   Phone: [phone]  Fax: [phone]       Mark Dunlap    [birth year]    MRN: [id number]        Procedure Day Dorna Bloom: Wednesday, 03-14-10     Arrival Time: 8:30 a.m.      Procedure Time: 9:30 a.m.     Location of Procedure:                    x   [facility] (4th Floor)   PREPARATION FOR COLONOSCOPY WITH MOVIPREP   Starting 5 days prior to your procedure 03-09-10 do not eat nuts, seeds, popcorn, corn, beans, peas,  salads, or any raw vegetables.  Do not take any fiber supplements (e.g. Metamucil, Citrucel, and Benefiber).  THE DAY BEFORE YOUR PROCEDURE         DATE:  03-13-10   DAY: Tuesday  1.  Drink clear liquids the entire day-NO SOLID FOOD  2.  Do not drink anything colored red or purple.  Avoid juices with pulp.  No orange juice.  3.  Drink at least 64 oz. (8 glasses) of fluid/clear liquids during the day to prevent dehydration and help the prep work efficiently.  CLEAR LIQUIDS INCLUDE: Water Jello Ice Popsicles Tea (sugar ok, no milk/cream) Powdered fruit flavored drinks Coffee (sugar ok, no milk/cream) Gatorade Juice: apple, white grape, white cranberry  Lemonade Clear bullion, consomm, broth Carbonated beverages (any kind) Strained chicken noodle soup Hard Candy                             4.  In the morning, mix first dose of MoviPrep solution:    Empty 1 Pouch A and 1 Pouch B into the disposable container    Add lukewarm drinking water to the top line of the container. Mix to dissolve    Refrigerate (mixed solution should be used within 24 hrs)  5.  Begin drinking the prep at 5:00 p.m. The MoviPrep container is divided by 4 marks.   Every 15 minutes drink the solution down to the next mark (approximately 8 oz) until the full liter is complete.   6.  Follow completed prep with 16 oz of clear liquid of your choice (Nothing red or  purple).  Continue to drink clear liquids until bedtime.  7.  Before going to bed, mix second dose of MoviPrep solution:    Empty 1 Pouch A and 1 Pouch B into the disposable container    Add lukewarm drinking water to the top line of the container. Mix to dissolve    Refrigerate  THE DAY OF YOUR PROCEDURE      DATE: 03-14-10   DAY: Wednesday  Beginning at 4:30 a.m. (5 hours before procedure):         1. Every 15 minutes, drink the solution down to the next mark (approx 8 oz) until the full liter is complete.  2. Follow completed prep with 16 oz. of clear liquid of your choice.    3. You may drink clear liquids until 7:30 a.m.  (2 HOURS BEFORE PROCEDURE).   MEDICATION INSTRUCTIONS  Unless otherwise instructed, you should take regular prescription medications with a small sip of water   as early as possible the morning of your procedure.    Additional medication instructions:  hold Blood pressure pill morning of  procedure         OTHER INSTRUCTIONS  You will need a responsible adult at least 51 years of age to accompany you and drive you home.   This person must remain in the waiting room during your procedure.  Wear loose fitting clothing that is easily removed.  Leave jewelry and other valuables at home.  However, you may wish to bring a book to read or  an iPod/MP3 player to listen to music as you wait for your procedure to start.  Remove all body piercing jewelry and leave at home.  Total time from sign-in until discharge is approximately 2-3 hours.  You should go home directly after your procedure and rest.  You can resume normal activities the  day after your procedure.  The day of your procedure you should not:   Drive   Make legal decisions   Operate machinery   Drink alcohol   Return to work  You will receive specific instructions about eating, activities and medications before you leave.    The above instructions have been reviewed and  explained to me by   Sherren Kerns RN  Mar 05, 2010 4:24 PM    I fully understand and can verbalize these instructions _____________________________ Date _________

## 2010-11-15 NOTE — Letter (Signed)
Summary: [facility]  [address] [place], [place] [address]   Phone: [phone]  Fax: [phone]    Today's Date: February 09, 2010  Name of Patient: Mark Dunlap  The above named patient had a medical visit today at:  am / pm.  Please take this into consideration when reviewing the time away from work/school.    Special Instructions:  [ X ] None  [  ] To be off the remainder of today, returning to the normal work / school schedule tomorrow.  [  ] To be off until the next scheduled appointment on ______________________.  [  ] Other ________________________________________________________________ ________________________________________________________________________   Sincerely yours,   Margaret Pyle, CMA

## 2010-11-15 NOTE — Assessment & Plan Note (Signed)
Summary: CPX/ NWS  #   Vital Signs:  Patient profile:   50 year old male Height:      73.5 inches Weight:      157.50 pounds BMI:     20.57 O2 Sat:      97 % on Room air Temp:     98.2 degrees F oral Pulse rate:   82 / minute BP sitting:   102 / 66  (left arm) Cuff size:   regular  Vitals Entered ByZella Ball Ewing (Feb 13, 2010 10:28 AM)  O2 Flow:  Room air  CC: Adult Physical/RE   Primary Care Provider:  Corwin Levins MD  CC:  Adult Physical/RE.  History of Present Illness: wt down from 169 pk wt and not sure why,  no obvious hyperthyroid symtpoms.  Pt denies CP, sob, doe, wheezing, orthopnea, pnd, worsening LE edema, palps, dizziness or syncope  Pt denies new neuro symptoms such as headache, facial or extremity weakness  .  Pain overall controlled, no new complaints.    Preventive Screening-Counseling & Management      Drug Use:  no.    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 1 By Mouth Once Daily 2)  Naproxen 500 Mg Tabs (Naproxen) .[name].. 1 By Mouth Two Times A Day 3)  Adult Aspirin Ec Low Strength 81 Mg Tbec (Aspirin) .[name].. 1 By Mouth Once Daily 4)  Hydrocodone-Acetaminophen 10-325 Mg Tabs (Hydrocodone-Acetaminophen) .[name].. 1 By Mouth Four Times Per Day As  Needed  Current Medications (verified): 1)  Lisinopril-Hydrochlorothiazide 20-25 Mg Tabs (Lisinopril-Hydrochlorothiazide) .[name].. 1 By Mouth Once Daily 2)  Naproxen 500 Mg Tabs (Naproxen) .[name].. 1 By Mouth Two Times A Day As Needed Pain 3)  Adult Aspirin Ec Low Strength 81 Mg Tbec (Aspirin) .[name].. 1 By Mouth Once Daily 4)  Hydrocodone-Acetaminophen 10-325 Mg Tabs (Hydrocodone-Acetaminophen) .[name].. 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  hx of ETOH/crack cocaine - none since 11/08 per pt/Does Not Drive due to this hx of adjustment disorder 12/08 DJD left hip and left knee  Past Surgical History: Last updated: 08/30/2008 s/p right hand I&D due to abscess 12/07 - dr Amanda Pea  Family History: Last updated: 08/30/2008 father with ETOH, stroke, heart disease, dialysis mother with arthritis, elevated cholesterol sister with HTN, DM brother with DM aunt with heart disease brother with ETOH cousin with ETOH  Social History: Last updated: 02/13/2010 Single no children work - [employer] pl[employer] for [employer] Current Smoker Alcohol use-no Drug use-no  Risk Factors: Smoking Status: current (08/30/2008)  Family History: Reviewed history from 08/30/2008 and no changes required. father with ETOH, stroke, heart disease, dialysis mother with arthritis, elevated cholesterol sister  with HTN, DM brother with DM aunt with heart disease brother with ETOH cousin with ETOH  Social History: Reviewed history from 08/30/2008 and no changes required. 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         all otherwise negative per pt -    Physical Exam  General:  alert and well-developed but thin Head:  normocephalic and atraumatic.   Eyes:  vision grossly intact, pupils equal, and pupils round.   Ears:  R ear normal and L ear normal.   Nose:  no external deformity and no nasal discharge.   Mouth:  no gingival abnormalities and pharynx pink and moist.   Neck:  supple and no masses.   Lungs:  normal respiratory effort and normal breath sounds.   Heart:  normal rate and regular rhythm.   Abdomen:  soft, non-tender, and normal bowel sounds.   Msk:  no joint tenderness and no joint swelling.   Extremities:  no edema, no erythema  Neurologic:  cranial nerves II-XII intact and strength normal in all extremities.   Skin:  color normal and no rashes.   Psych:  not depressed appearing and moderately anxious.     Impression & Recommendations:  Problem # 1:  Preventive Health Care (ICD-V70.0) Overall doing well, age appropriate education and counseling updated and referral for appropriate preventive services done unless declined, immunizations up to date or declined, diet counseling done if overweight, urged to quit smoking if smokes , most recent labs reviewed and current ordered if appropriate, ecg reviewed or declined (interpretation per ECG scanned in the EMR if done); information regarding Medicare Prevention requirements given if appropriate   Orders: EKG w/ Interpretation (93000) Gastroenterology Referral (GI)  Problem # 2:  HYPERTHYROIDISM (ICD-242.90) ? old - was on a three times a day med at [facility] prior to coming here;  Continue all previous medications as before this visit for now, to refer  endo  Orders: Endocrinology Referral (Endocrine)  Problem # 3:  KNEE PAIN, LEFT, ACUTE (ICD-719.46)  His updated medication list for this problem includes:    Naproxen 500 Mg Tabs (Naproxen) .[name]... 1 by mouth two times a day as needed pain    Adult Aspirin Ec Low Strength 81 Mg Tbec (Aspirin) .[name]... 1 by mouth once daily    Hydrocodone-acetaminophen 10-325 Mg Tabs (Hydrocodone-acetaminophen) .[name]... 1 by mouth four times per day as needed with chronic pain syndrome - meds refilled today, to use sparingly  Problem # 4:  ADJUSTMENT DISORDER WITH DEPRESSED MOOD (ICD-309.0) stable overall by hx and exam, ok to continue meds/tx as is   Problem # 5:  HYPERTENSION (ICD-401.9)  His updated medication list for this problem includes:    Lisinopril-hydrochlorothiazide 20-25 Mg Tabs (Lisinopril-hydrochlorothiazide) .[name]... 1 by mouth once daily stable overall by hx and exam, ok to continue meds/tx as is   BP today: 102/66 Prior BP: 108/72 (10/31/2009)  Labs Reviewed: K+: 4.8 (02/09/2010) Creat: : 1.0 (02/09/2010)   Chol: 128 (02/09/2010)   HDL: 34.90 (02/09/2010)   LDL: 81 (02/09/2010)   TG: 59.0 (02/09/2010)  Complete Medication List:  1)  Lisinopril-hydrochlorothiazide 20-25 Mg Tabs (Lisinopril-hydrochlorothiazide) .[name].. 1 by mouth once daily 2)  Naproxen 500 Mg Tabs (Naproxen) .[name].. 1 by mouth two times a day as needed pain 3)  Adult Aspirin Ec Low Strength 81 Mg Tbec (Aspirin) .[name].. 1 by mouth once daily 4)  Hydrocodone-acetaminophen 10-325 Mg Tabs (Hydrocodone-acetaminophen) .[name].. 1 by mouth four times per day as needed  Patient Instructions: 1)  You will be contacted about the referral(s) to: colonoscopy and Dr Ellison/thyroid doctor 2)  Continue all previous medications as before this visit ;  your medications were sent to the pharmacy on the computer, except for the hydrocodone 3)  Your EKG was OK today 4)  Please schedule a follow-up appointment in 1 year or soone if  needed Prescriptions: NAPROXEN 500 MG TABS (NAPROXEN) 1 by mouth two times a day as needed pain  #60 x 11   Entered and Authorized by:   Corwin Levins MD   Signed by:   Corwin Levins MD on 02/13/2010   Method used:   Electronically to        [facility] [address] [phone]* (retail)       [address]       [place], [place]  [address]       Ph: [phone]       Fax: [phone]   RxID:   [id number] LISINOPRIL-HYDROCHLOROTHIAZIDE 20-25 MG TABS (LISINOPRIL-HYDROCHLOROTHIAZIDE) 1 by mouth once daily  #90 x 3   Entered and Authorized by:   Corwin Levins MD   Signed by:   Corwin Levins MD on 02/13/2010   Method used:   Electronically to        [facility] [address] [phone]* (retail)       [address]       [place], [place]  [address]       Ph: [phone]       Fax: [phone]   RxID:   [id number] HYDROCODONE-ACETAMINOPHEN 10-325 MG TABS (HYDROCODONE-ACETAMINOPHEN) 1 by mouth four times per day as needed  #60 x 2   Entered and Authorized by:   Corwin Levins MD   Signed by:   Corwin Levins MD on 02/13/2010   Method used:   Print then Give to Patient   RxID:   [id number] NAPROXEN 500 MG TABS (NAPROXEN) 1 by mouth two times a day as needed pain  #60 x 11   Entered and Authorized by:   Corwin Levins MD   Signed by:   Corwin Levins MD on 02/13/2010   Method used:   [facility] [address] [phone]* (retail)       [address]       [place], [place]  [address]       Ph: [phone] or [phone]       Fax: [phone]   RxID:   [id number] LISINOPRIL-HYDROCHLOROTHIAZIDE 20-25 MG TABS (LISINOPRIL-HYDROCHLOROTHIAZIDE) 1 by mouth once daily  #90 x 3   Entered and Authorized by:   Corwin Levins MD   Signed by:   Corwin Levins MD on 02/13/2010   Method used:   [facility]ronically to        [facility] [address] [phone]* (retail)       [address]       [place], [place]  [address]  Ph: [phone] or  [phone]       Fax: [phone]   RxID:   [id number]

## 2010-11-15 NOTE — Letter (Signed)
Summary: Generic Letter  [facility]  [address] [place], [place] [address]   Phone: [phone]  Fax: [phone]    08/03/2010  Mark Dunlap [address] [place], [place]  [address]  Dear Mr. MEDFORD, STAHELI was seen in the office yesterday on 08/02/2010. He had an appointment with Dr. Everardo All at 3:45pm. If there are any other questions, please feel free to contact our office.        Sincerely,   Dr. Romero Belling

## 2010-11-15 NOTE — Progress Notes (Signed)
  Phone Note Call from Patient   Caller: Patient Reason for Call: Referral Summary of Call: Pt states never heard anything concerning his thyroid scan. Per EMR was schedule for 04/02/10 @ 10:45 by Stanton Kidney. she left msg for pt to give her a call back which he never did. Can scan be re-ordered? Initial call taken by: Orlan Leavens RMA,  May 21, 2010 2:02 PM  Follow-up for Phone Call        to pcc;  do i need to reorder scan? Follow-up by: Minus Breeding MD,  May 21, 2010 2:51 PM  Additional Follow-up for Phone Call Additional follow up Details #1::        Appt Re scheduled for Aug 17-aug 18,2011 @1 :00   _ [facility] pt informed spoke with pt Shelbie Proctor  May 21, 2010 3:09 PM

## 2010-11-15 NOTE — Progress Notes (Signed)
Summary: WORK NOTE  Phone Note Call from Patient   Caller: Patient Summary of Call: PT WILL NEED DRS NOTE FOR BEING OUT OF WORK 9/7-9/12 Initial call taken by: Migdalia Dk,  June 20, 2010 10:58 AM  Follow-up for Phone Call        MD okay for work note, forwarded to pt. Follow-up by: Margaret Pyle, CMA,  June 20, 2010 11:10 AM

## 2010-11-15 NOTE — Progress Notes (Signed)
Summary: Patient needs RX for sinus infection.  Phone Note Call from Patient   Caller: Patient Summary of Call: Patient called and stated that he left a message on the triage vm yesterday for the doctor to give him a RX for a  sinus infection. Patient called back to see if his request has been processed. Initial call taken by: Daphane Shepherd,  November 16, 2009 2:21 PM  Follow-up for Phone Call        pt left a message on triage this morning requesting ABX and was informed that MD can not Rx ABX with out OV due to office policy Follow-up by: Margaret Pyle, CMA,  November 16, 2009 2:44 PM  Additional Follow-up for Phone Call Additional follow up Details #1::        pt's mother informed of above as well. she will call back and schedule appt for pt  Additional Follow-up by: Margaret Pyle, CMA,  November 17, 2009 8:16 AM

## 2010-11-15 NOTE — Progress Notes (Signed)
Summary: pt?  Phone Note Call from Patient Call back at Home Phone [phone]   Caller: Patient Summary of Call: pt called stating that he was instructed to take a 81mg  coated aspirin but he had been taking 325mg  instead. pt is concerned that this could be the cause of his low BP. Pt is requesting MD review labs and advise. Initial call taken by: Margaret Pyle, CMA,  November 01, 2009 9:34 AM  Follow-up for Phone Call        no ASA 325 is not cause of his low blood pressure - his labs are normal from yesterday's OV - please let him know there is no anemia or kidney problems - he is to cont plan as outlined as OV yesterday (hold lisinoprilHCT until Sat, hydrate) - thanks and it is ok to take 325 aspirin once daily as he is doing (med list changed) Follow-up by: Newt Lukes MD,  November 01, 2009 10:15 AM  Additional Follow-up for Phone Call Additional follow up Details #1::        called pt, phone rang x 15, no answer, no VM. called pt, phone rang x 15, no answer, no VM. Additional Follow-up by: Margaret Pyle, CMA,  November 01, 2009 3:54 PM    Additional Follow-up for Phone Call Additional follow up Details #2::    called pt, phone rang x 15, no answer, no VM Follow-up by: Margaret Pyle, CMA,  November 02, 2009 8:04 AM  Additional Follow-up for Phone Call Additional follow up Details #3:: Details for Additional Follow-up Action Taken: pt informed. per pt he is more comfortable taking 81mg  aspirin. I will change in EMR Additional Follow-up by: Margaret Pyle, CMA,  November 02, 2009 2:47 PM  New/Updated Medications: ASPIRIN 325 MG TABS (ASPIRIN) 1 by mouth once daily ADULT ASPIRIN EC LOW STRENGTH 81 MG TBEC (ASPIRIN) 1 by mouth once daily

## 2010-11-15 NOTE — Letter (Signed)
Summary: Previsit letter  [facility]  [address] [place], [place] [address]   Phone: [phone]  Fax: [phone]       02/26/2010 MRN: [id number]  Mark Dunlap [address] [place], [place]  [address]  Dear [name],  Welcome to the Gastroenterology Division at [facility].    You are scheduled to see a nurse for your pre-procedure visit on 03-05-10 at 5:30p.m. on the 3rd floor at [facility], [address]  We ask that you try to arrive at our office 15 minutes prior to your appointment time to allow for check-in.  Your nurse visit will consist of discussing your medical and surgical history, your immediate family medical history, and your medications.    Please bring a complete list of all your medications or, if you prefer, bring the medication bottles and we will list them.  We will need to be aware of both prescribed and over the counter drugs.  We will need to know exact dosage information as well.  If you are on blood thinners (Coumadin, Plavix, Aggrenox, Ticlid, etc.) please call our office today/prior to your appointment, as we need to consult with your physician about holding your medication.   Please be prepared to read and sign documents such as consent forms, a financial agreement, and acknowledgement forms.  If necessary, and with your consent, a friend or relative is welcome to sit-in on the nurse visit with you.  Please bring your insurance card so that we may make a copy of it.  If your insurance requires a referral to see a specialist, please bring your referral form from your primary care physician.  No co-pay is required for this nurse visit.     If you cannot keep your appointment, please call [phone] to cancel or reschedule prior to your appointment date.  This allows [place] the opportunity to schedule an appointment for another patient in need of care.    Thank you for choosing [facility] for your medical  needs.  We appreciate the opportunity to care for you.  Please visit [place] at our website  to learn more about our practice.                     Sincerely.                                                                                                                   The Gastroenterology Division

## 2010-11-15 NOTE — Letter (Signed)
Summary: Out of Work  [facility]  [address] [place], [place] [address]   Phone: [phone]  Fax: [phone]    Feb 13, 2010   Employee:  Mark Dunlap    To Whom It May Concern:   For Medical reasons, please excuse the above named employee from work for the following dates:  Start:   02/13/2010  End:   02/13/2010  If you need additional information, please feel free to contact our office.         Sincerely,    Dr. Oliver Barre

## 2010-11-15 NOTE — Progress Notes (Signed)
Summary: APPT REFUSED  Phone Note Call from Patient Call back at Home Phone [phone]   Caller: Patient Summary of Call: FYI: PT RETURNED OUR CALL ABOUT MAKING AN APPT.  STATES HE DOES NOT HAVE THE MONEY TO COME IN.  HE REFUSED AN APPT.  PT SAID HE KNEW WHAT HE NEEDED (ANTIOBIOTICS) AND DIDN'T NEED AN APPT.  HE JUST WANTED SOMETHING CALLED IN.  I EXPLAINED THAT HE WOULD NEED AN APPT FOR THAT.  HE WILL GO TO A DRUG STORE AND TRY OTC MEDS.  Initial call taken by: Hilarie Fredrickson,  November 17, 2009 8:49 AM  Follow-up for Phone Call        noted Follow-up by: Corwin Levins MD,  November 17, 2009 10:09 AM

## 2010-11-15 NOTE — Assessment & Plan Note (Signed)
Summary: NEW ENDO PT-PER FLAG/MP-HYPERTHY-PHONE  STC   Vital Signs:  Patient profile:   50 year old male Height:      73.5 inches (186.69 cm) Weight:      161 pounds (73.18 kg) O2 Sat:      98 % on Room air Temp:     97.6 degrees F (36.44 degrees C) oral Pulse rate:   87 / minute BP sitting:   136 / 90  (left arm) Cuff size:   regular  Vitals Entered By: Josph Macho RMA (Mar 02, 2010 3:54 PM)  O2 Flow:  Room air CC: New Endo: Hyperthyroid/ CF   Primary Provider:  Corwin Levins MD  CC:  New Endo: Hyperthyroid/ CF.  History of Present Illness: pt has been noted to have a few years of a slightly suppressed tsh.  he took a thyroid pill for a brief time,  but none for the past few years.  symptomatically, he has slight numbness of the feet, and associated polyuria.   Current Medications (verified): 1)  Lisinopril-Hydrochlorothiazide 20-25 Mg Tabs (Lisinopril-Hydrochlorothiazide) .[name].. 1 By Mouth Once Daily 2)  Naproxen 500 Mg Tabs (Naproxen) .[name].. 1 By Mouth Two Times A Day As Needed Pain 3)  Adult Aspirin Ec Low Strength 81 Mg Tbec (Aspirin) .[name].. 1 By Mouth Once Daily 4)  Hydrocodone-Acetaminophen 10-325 Mg Tabs (Hydrocodone-Acetaminophen) .[name].. 1 By Mouth Four Times Per Day As Needed 5)  Cetirizine Hcl 10 Mg Tabs (Cetirizine Hcl) .[name].. 1po Once Daily As Needed Allergies 6)  Fluticasone Propionate 50 Mcg/act Susp (Fluticasone Propionate) .... 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 father with ETOH, stroke, heart disease, dialysis mother with arthritis, elevated cholesterol sister with HTN, DM brother with  DM aunt with heart disease brother with ETOH cousin with ETOH mother and sister had resection of benign goiter.  Social History: Reviewed history from 02/13/2010 and no changes required. Single no children work Administrator, sports for [employer] Current Smoker Alcohol use-no Drug use-no  Review of Systems       The patient complains of weight loss and weight gain.         denies headache, hoarseness, palpitations, sob, diarrhea, excessive diaphoresis, tremor, anxiety, erectile dysfunction, easy bruising, and rhinorrhea.  he has arthralgias.   Physical Exam  General:  normal appearance.   Head:  head: no deformity eyes: no periorbital swelling, no proptosis external nose and ears are normal mouth: no lesion seen Neck:  ? of large right thyroid mass Lungs:  Clear to auscultation bilaterally. Normal respiratory effort.  Heart:  Regular rate and rhythm without murmurs or gallops noted. Normal S1,S2.   Msk:  muscle bulk and strength are grossly normal.  no obvious joint swelling.  gait is normal and steady  Pulses:  dorsalis pedis intact bilat.   Extremities:  no deformity.  no ulcer on the feet.  feet are of normal color and temp.  no edema  Neurologic:  cn 2-12 grossly intact.   readily moves all 4's.   sensation is intact to touch on the feet  Skin:  normal texture and temp.  no rash.  not diaphoretic  Cervical Nodes:  No significant adenopathy.  Psych:  Alert and cooperative; normal mood and affect; normal attention span and concentration.   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  2)  check a "scan" (a special but easy type of thyroid x ray), as well as an ultrasound (also easy).  you will be called with a day and time for an appointment 3)  based on these results, i would probably recommend a 1-time radioactive iodine pill to improve your blood test, and to shrink the goiter.  the pill is gone from your body in a few days, but the effect takes a few months. 4)  please plan to return approx 6 weeks after the treatment for an appointment here.

## 2010-11-15 NOTE — Assessment & Plan Note (Signed)
Summary: LOW BLOOD PRESSURE/#/john/cd   Vital Signs:  Patient profile:   50 year old male Height:      73.5 inches (186.69 cm) Weight:      159.4 pounds (72.45 kg) O2 Sat:      97 % on Room air Temp:     97.0 degrees F (36.11 degrees C) oral Pulse rate:   95 / minute BP sitting:   108 / 72  (left arm) Cuff size:   regular  Vitals Entered By: Orlan Leavens (October 31, 2009 1:54 PM)  O2 Flow:  Room air CC: Low BP/ Pt states this am @ 9:00 bp was 100/68, recheck after lunch bp 87/56 Is Patient Diabetic? No Pain Assessment Patient in pain? no        Primary Care Provider:  Corwin Levins MD  CC:  Low BP/ Pt states this am @ 9:00 bp was 100/68 and recheck after lunch bp 87/56.  History of Present Illness: here today with complaint of low blod pressure reading. onset of symptoms was  this AM (<6 hours ago). course has been sudden onset and now occurs in persisitng pattern. symptom characterized as dizzy feeling every since waking up problem associated with feeling light heded but not associated with syncope, HA, or CP. symptoms improved by sitting and resting. symptoms worsened with activity such as standing. no prior hx of same symptoms.  not taking narcs at this time - daily NSAIDs for OA denies dehydration such as vomitting or diarrhea  - no black or liquid stools  Current Medications (verified): 1)  Lisinopril-Hydrochlorothiazide 20-25 Mg Tabs (Lisinopril-Hydrochlorothiazide) .[name].. 1 By Mouth Once Daily 2)  Naproxen 500 Mg Tabs (Naproxen) .[name].. 1 By Mouth Two Times A Day 3)  Adult Aspirin Ec Low Strength 81 Mg Tbec (Aspirin) .[name].. 1po Once Daily 4)  Hydrocodone-Acetaminophen 10-325 Mg Tabs (Hydrocodone-Acetaminophen) .[name].. 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    Physical Exam  General:  alert, well-developed, well-nourished, and cooperative to examination.    Lungs:  normal respiratory effort, no intercostal retractions or use of accessory muscles; normal breath sounds bilaterally - no crackles and no wheezes.    Heart:  normal rate, regular rhythm, no murmur, and no rub. BLE without edema.    Impression & Recommendations:  Problem # 1:  HYPERTENSION (ICD-401.9) over treated t this time -  exam benign but symptoms of over tx (dizzy) stop BP med x 72hours - then resume if no symptoms  check labs r/o dehydration or anemia (takes daily ASA and naproxen) - see next  His updated medication list for this problem includes:    Lisinopril-hydrochlorothiazide 20-25 Mg Tabs (Lisinopril-hydrochlorothiazide) .[name]... 1 by mouth once daily  BP today: 108/72 Prior BP: 130/82 (07/31/2009)  Labs Reviewed: K+: 4.3 (08/30/2008) Creat: : 1.0 (08/30/2008)   Chol: 158 (08/30/2008)   HDL: 44.3 (08/30/2008)   LDL: 97 (08/30/2008)   TG: 84 (08/30/2008)  Problem # 2:  HYPOTENSION (ICD-458.9)  see above  Orders: TLB-BMP (Basic Metabolic Panel-BMET) (80048-METABOL) TLB-CBC Platelet - w/Differential (85025-CBCD)  Complete Medication List: 1)  Lisinopril-hydrochlorothiazide 20-25 Mg  Tabs (Lisinopril-hydrochlorothiazide) .[name].. 1 by mouth once daily 2)  Naproxen 500 Mg Tabs (Naproxen) .[name].. 1 by mouth two times a day 3)  Adult Aspirin Ec Low Strength 81 Mg Tbec (Aspirin) .[name].. 1po once daily 4)  Hydrocodone-acetaminophen 10-325 Mg Tabs (Hydrocodone-acetaminophen) .[name].. 1 by mouth four times per day as needed  Patient Instructions: 1)  stop your blood pressure medicine for 3 days - may start again on Saturday AM if you are feeling normal - 2)  test(s) ordered today - your results will be posted  on the phone tree for review in 48-72 hours from the time of test completion; if any changes need to be made or there are abnormal results, you will be contacted directly.  3)  drink fluids tonight such as water or juice as discussed, no alcohol or caffiene 4)  if continued symptoms of dizziness, call for re-evaluation with dr. Jonny Ruiz

## 2010-11-15 NOTE — Letter (Signed)
Summary: [facility]  [address] [place], [place] [address]   Phone: [phone]  Fax: [phone]    Today's Date: February 09, 2010  Name of Patient: Mark Dunlap  The above named patient had a medical visit today at:  am / pm.  Please take this into consideration when reviewing the time away from work/school.    Special Instructions:  [  ] None  [  ] To be off the remainder of today, returning to the normal work / school schedule tomorrow.  [  ] To be off until the next scheduled appointment on ______________________.  [  ] Other ________________________________________________________________ ________________________________________________________________________   Sincerely yours,   Margaret Pyle, CMA

## 2011-01-03 ENCOUNTER — Other Ambulatory Visit: Payer: Self-pay | Admitting: Endocrinology

## 2011-01-24 ENCOUNTER — Other Ambulatory Visit: Payer: Self-pay | Admitting: Endocrinology

## 2011-03-01 NOTE — Op Note (Signed)
NAMECAID, RADIN NO.:  [id number]   MEDICAL RECORD NO.:  [id number]          PATIENT TYPE:  INP   LOCATION:  5022                         FACILITY:  [facility]   PHYSICIAN:  Dionne Ano. Gramig III, M.D.DATE OF BIRTH:  04-Jul-1961   DATE OF PROCEDURE:  10/12/2006  DATE OF DISCHARGE:                               OPERATIVE REPORT   PREOPERATIVE DIAGNOSIS:  Large deep abscess, right hand, midpalmar space  and second web space, status post I&D, October 10, 2006.   POSTOPERATIVE DIAGNOSIS:  Large deep abscess, right hand, midpalmar  space and second web space, status post I&D, October 10, 2006.   PROCEDURE:  Repeat I&D, skin, subcutaneous tissue, muscle, tendon,  tendon sheath, midpalmar space and second web space, right hand.   SURGEON:  Dionne Ano. Amanda Pea, M.D.   ASSISTANT:  None.   COMPLICATIONS:  None.   ANESTHESIA:  General.   TOURNIQUET TIME:  Less than 30 minutes.   INDICATIONS FOR THE PROCEDURE:  This patient is a 50 year old man who  presents for the above-mentioned diagnosis.  I have counseled him  regarding the risks and benefits of surgery including risks of  infection, bleeding, anesthesia, damage to normal structures and failure  of surgery to accomplish intended goals of relieving symptoms and  restoring function.  With this in mind, he desires to proceed.  All  questions have been encouraged and answered preoperatively.   OPERATIVE PROCEDURE:  The patient was seen by myself and anesthesia.  He  was taken to the operative suite and underwent a smooth induction of  general anesthesia.  Permit was signed.  Arm was marked and he was  thoroughly consented and aware of the risks and benefits of surgery.  Following this, under general anesthetic, he had the dressing removed.  He had no significant ascending erythema or cellulitis.  He still had  marked swelling, which was not particularly surprising.  He underwent an  exposure of the flexor tendon  apparatus about the index finger and  middle finger.  Following this the radial and ulnar digital arteries and  common digital nerves and arteries were identified in the second web  space.  These were traced out, protected, and following this I removed  some devitalized pre-necrotic tissue.  There was no large fluid re-  accumulation in the hand.   At this time I then performed I&D and irrigation with 3 L of saline.  He  tolerated this well.  This was lavaged to the wound aggressively.  Following this, I then placed a Penrose drain in the wound.  The Penrose  drain was through and through from volar to dorsal.  I carefully  identified the midpalmar space.  The muscle looked excellent and  healthy.  I also identified the second web space, which was thoroughly  I&D'd.  He tolerated this well and there were no complicated features.   Following this, I then packed it with gauze, placed neosporin over the  fingers and hand, forearm as well as wrist and hand as it was fairly  dry.  I then placed him in a Comfort compressive  wrap.  He tolerated  this well and there were no complicating features.   Following this he was extubated, taken to the recovery room.  He will  continue on IV antibiotics.  We will continue to watch for final  bacterial growth in terms of his cultures, etc.  I have discussed  __________, etc.   We specifically discussed change of lifestyle and the pre- and  postoperative changes as well as recommendations.  I have discussed  these issues with him at length and he understands this as does his  family.           ______________________________  Dionne Ano. Everlene Other, M.D.     Nash Mantis  D:  10/12/2006  T:  10/12/2006  Job:  [id number]

## 2011-03-01 NOTE — Discharge Summary (Signed)
NAMEANDERSON, MIDDLEBROOKS NO.:  [id number]   MEDICAL RECORD NO.:  [id number]          PATIENT TYPE:  INP   LOCATION:  5022                         FACILITY:  [facility]   PHYSICIAN:  Dionne Ano. Gramig, M.D.DATE OF BIRTH:  Feb 28, 1961   DATE OF ADMISSION:  10/10/2006  DATE OF DISCHARGE:  10/14/2006                               DISCHARGE SUMMARY   ADMISSION DIAGNOSES:  1. Deep abscess right hand.  2. History of glaucoma blind in left eye per patient.   DISCHARGE DIAGNOSES:  1. Improved.  2. Hypertension.  3. History of crack cocaine and alcohol abuse.  4. Inpatient findings of right pleural effusion.   SURGEON:  Dionne Ano. Amanda Pea, M.D.   CONSULTATIONS:  Internal medicine teaching services.   PROCEDURE:  1. On December 28, date of admit palmar space, second web space and      exploration of the flexor sheath secondary to deep abscess of the      right hand.  2. On October 12, 2006, repeat I&D of the right hand in subcutaneous      tissue, second web space and palmar space.   BRIEF HISTORY OF PRESENT ILLNESS:  Mr. Dorris is a 50 year old  gentleman who presented to the [facility] Emergency Room on October 10, 2006, with complaints of pain, soft tissue swelling, and a questionable  foreign body on the palm of his hand per his description secondary to a  broken piece of glass from a bottle.  He states he had tried Epsom salts  soaks and peroxide cleaning, but despite this he had increased amount of  pain and soft tissue swelling.  Given this, he presented to the  emergency room setting on October 10, 2006, with noted right hand volar  and dorsal edema with a focal abscess at the second web space with noted  warmth and pain.  Attempts of flexion and extension of the index and  middle finger, no digital edema was present.  Sensation refill was  intact.  He had early Kanavel signs; however, this was difficult to  assess given the patient's high presentation of  discomfort during the  exam.  The thenar, hypothenar eminence appeared soft.  Radiograph showed  no acute bony abnormalities.  He was afebrile.  Blood pressure was  159/108, respirations 20, pulse 87.  His WBC was noted to be 13.9, H&H  15.1 and 44.7, ASR was 9.  C-MET was within normal limits.  Given the  size of increasing pain, swelling, and focal abscess, the decision was  made to admit the patient and proceed to the operative suite.   HOSPITAL COURSE:  Mr. Granville was admitted and underwent the above  initial procedure on October 10, 2006, without difficulties.  Intraoperative cultures were obtained.  The patient was admitted  thereafter and empirically started on vancomycin for antibiotic  prophylaxis given his preoperative findings and intraoperative findings.  Typical postoperative pain management was employed along with elevation  and diligent wound care.  The patient underwent admission over the  following 4 days and required a repeat I&D on October 12, 2006.  The  patient continued to be hypertensive and thus an internal medicine  teaching services were consulted and followed the patient diligently  throughout his hospital course.  During his hospital course, the patient  did admit having a crack pipe break in his hand and attributed this to  the onset of his problems.  He remains stable to the upper extremity.  His blood pressure was better controlled with implementation of  pharmacological agents including hydrochlorothiazide and Norvasc.   A preoperative chest x-ray did show findings suspicious for a pleural  effusion with a CT scan performed while inpatient.  It was noted per CT  scan he had a right pleural effusion, scattered gram glass opacities,  possibly significant cocaine abuse.   A thoracocentesis was suggested initially, however, the patient was  noted to be somewhat agitated intermittently throughout his hospital  stay and belligerent at times in terms of his  physical examination and  attempts at evaluation per internal medicine teaching services.  His T-  max was 101.3 on December 29 and throughout his hospital course this  began to stabilize.  His systolic blood pressures ranged between the  130s and 170s and the diastolic was between the 60s and 90s.  He remains  stable in terms of his upper extremity and gram-positive cocci were  noted on his Gram stain.  Final cultures were pending.  The patient  throughout his stay, at one point in time expressed thoughts of suicide  and therefore, psychiatric consult was performed.  Per his evaluation,  he was noted not to be suicidal and had no thoughts of harming others.  He needed no psychotropic's per Dr. Jeanie Sewer.   The patient was noted to leave the unit on different occasions to smoke.  Despite attempts to discourage this, the patient threatened leaving if  he could not smoke.  He continued to improve in terms of his upper  extremity's blood pressure, issues stabilized.  The decision was made to  have him followup as an outpatient in regards to the possible pleural  effusion as he was stable from a pulmonary standpoint without  significant hypoxia or dyspnea.  The patient continued his inpatient  stay for hydrotherapy and once his wound overall was stabilized and once  he was cleared per medicine and final cultures revealed Staph aureus,  sensitive to tetracycline, the decision was made to discharge him home.  On his discharge date, he was afebrile.  He was alert and oriented.  WBC  was 7.5, H&H was 13.8 and 41 respectively.  Bumet was within normal  limits.  His wound was stable overall without signs of reaccumulating  infection.  It was discussed with him to followup in an outpatient  setting for his medical issues per the internal medicine teaching  services.   CONDITION ON DISCHARGE:  Improved.   DIET:  Regular.   ACTIVITY:  He will keep his upper extremity clean, dry, and intact.   We have discussed with him daily dressing changes and demonstrated this to  him at bedside.  He will work diligent on range of motion to the upper  extremity.   DISCHARGE MEDICATIONS:  Will include:  1. Doxycycline 100 mg one p.o. b.i.d.  2. Vicodin 5/500 one to two p.o. q.4-6 p.r.n. pain.   FOLLOWUP:  He will followup with Dr. Amanda Pea in 2 to 4 days and call [phone] for an appointment, questions, or concerns.  Medical followup per  internal medicine teaching services.  Karie Chimera, P.A.-C.    ______________________________  Dionne Ano. Amanda Pea, M.D.    BB/MEDQ  D:  01/01/2007  T:  01/02/2007  Job:  [id number]

## 2011-03-01 NOTE — Consult Note (Signed)
NAMERAMAL, ECKHARDT NO.:  [id number]   MEDICAL RECORD NO.:  [id number]          PATIENT TYPE:  OUT   LOCATION:  XRAY                         FACILITY:  [facility]   PHYSICIAN:  Antonietta Breach, M.D.  DATE OF BIRTH:  01-16-1961   DATE OF CONSULTATION:  DATE OF DISCHARGE:  08/21/2007                                 CONSULTATION   REQUESTING PHYSICIAN:  Dionne Ano. Amanda Pea, M.D.   REASON FOR CONSULTATION:  Suicidal thoughts.   HISTORY OF PRESENT ILLNESS:  Mark Dunlap is a 50 year old male  who was admitted to the [facility] on October 10, 2006 with  swelling and pain in his right hand.  He underwent a treatment by the  orthopedic service.  In the past day, the patient expressed thoughts of  suicide.  The undersigned was called to evaluate him.   The patient denies any depressed mood.  He has no thoughts of harming  himself or others.  He has constructive future goals and plans.  He has  no hallucinations or delusions.  He is socially appropriate and  cooperative.  His expression was a figure of speech.   PAST PSYCHIATRIC HISTORY:  The patient denies.   FAMILY PSYCHIATRIC HISTORY:  None.   SOCIAL HISTORY:  Single.   PAST MEDICAL HISTORY:  Right hand abscess.   MEDICATIONS:  The patient does not take any regular psychotropic  medicines.   MENTAL STATUS EXAM:  Please see the history of present illness.  Mr.  Dunlap is alert and oriented.  Thought process is logical, coherent,  and goal-directed.  No looseness of associations.  Thought content:  Please see the history of present illness.  Concentration within normal  limits.  He is oriented to all spheres.  Memory within normal limits.  Insight good. Judgment is intact.   ASSESSMENT:   AXIS I:  Adjustment disorder with mixed disturbance of emotions and  conduct, now resolved.   AXIS II:  Deferred.   AXIS III:  See above.   AXIS IV:  General medical.   AXIS V:  1.   Mark Dunlap is  not at risk to harm himself or others.  He agrees to call  emergency services immediately for any thoughts of harming himself,  thoughts of harming others, or distress.   RECOMMENDATIONS:  Would have him follow up with psychiatry if any  symptoms return.      Antonietta Breach, M.D.  Electronically Signed     JW/MEDQ  D:  10/10/2007  T:  10/11/2007  Job:  [id number]

## 2011-03-01 NOTE — Op Note (Signed)
Mark Dunlap, ANTONELLI NO.:  [id number]   MEDICAL RECORD NO.:  [id number]          PATIENT TYPE:  INP   LOCATION:  1828                         FACILITY:  [facility]   PHYSICIAN:  Dionne Ano. Gramig III, M.D.DATE OF BIRTH:  June 09, 1961   DATE OF PROCEDURE:  10/10/2006  DATE OF DISCHARGE:                               OPERATIVE REPORT   PREOPERATIVE DIAGNOSES:  Large deep abscess, right hand, status post  glass injury to the hand greater than a week ago, with a large festering  wound, inability to move the hand and significant pain, as well as  abscess formation noted on my clinical exam.   POSTOPERATIVE DIAGNOSES:  Large deep abscess, right hand, status post  glass injury to the hand greater than a week ago, with a large festering  wound, inability to move the hand and significant pain, as well as  abscess formation noted on my clinical exam.   PROCEDURE:  Incision and debridement of right hand midpalmar space,  collar button abscess, second web, exploration of flexor sheath and  neurovascular bundles in the second web space.  The large abscess was  evacuated and decompressed.   SURGEON:  Dionne Ano. Amanda Pea, M.D.   ASSISTANT:  None.   COMPLICATIONS:  None.   ANESTHESIA:  General.   TOURNIQUET TIME:  Less than an hour.   DRAINS:  One large Penrose drain was placed.   INDICATIONS FOR PROCEDURE:  This patient is a 50 year old, unemployed,  black male, who has a history of crack cocaine abuse.  He admits to  using a crack pipe in the area of his hand, and likely sustained the  laceration from the crack pipe, and subsequent crack pipe burn.  The  patient presented to the emergency room with the above-mentioned  degrees.  I counseled him in regard to the risks and benefits of surgery  and the upper extremity predicament.  The risks of bleeding, infection,  anesthesia,damage to normal structures, and failure of the surgery to  accomplish its intended goals of  alleviating symptoms and restoring  function were discussed with the patient at length.  With this in mind,  he desired to proceed.  He has a white count in the 13,000s, has  significant pain and inability to move the hand, and he desperately  needs medical care and attention.  I have given him no guarantees, and  certainly encouraged him to try to change his lifestyle dramatically.   OPERATION IN DETAIL:  The patient was seen by myself and Anesthesia and  counseled.  The extremity to be operated on was marked.  He was laid  supine, appropriately padded and prepped and draped in the usual sterile  fashion in the operative suite, after general anesthesia was secured.  Once this was done, the arm was elevated, the tourniquet was insufflated  and an incision was made, modified Loletha Carrow in nature, over the second web  space.  A large amount of purulent tissue and material was removed, and  this was sent for aerobic and anaerobic culture.  Following this, the  patient then underwent a deeper dissection.  The neurovascular bundle  about the second web space, both the common and proper digital nerve and  artery were identified and protected.  Following this, I then I&D'd the  midpalmar space, as well as the second web space.  The patient had a  collar button abscess, as well as portions of the midpalmar space  purulent.  These were both evacuated and I&D'd aggressively.  Following  this, I explored both the index and middle finger flexor sheath.  These  were found to have intact tendinous structures and no advanced  purulence.  Following this, greater than 3 L of saline was placed in the  wound.  The patient tolerated this well.  I then placed a Penrose drain,  left the wound open, and packed the area with gauze.  He had excellent  refill with the tourniquet down.  Hemostasis was secured and the patient  was then transferred to the recovery room after a proximal splint was  applied.  He will be  admitted for IV antibiotics, observation, general  postop precautions, etc.  I have discussed this with him, etc., and all  questions have been encouraged and answered.  The patient understands  that we will need to perform aggressive wound care and intervention in  the form of debridements, IV antibiotics, and await cultures.  All  questions have been addressed.  He was given 1 g of vancomycin at the  conclusion of the case.           ______________________________  Dionne Ano. Everlene Other, M.D.     Nash Mantis  D:  10/10/2006  T:  10/11/2006  Job:  [id number]

## 2011-03-01 NOTE — Discharge Summary (Signed)
   NAME:  Mark Dunlap, Mark Dunlap                       ACCOUNT NO.:  [id number]   MEDICAL RECORD NO.:  [id number]                   PATIENT TYPE:  INP   LOCATION:  5706                                 FACILITY:  [facility]   PHYSICIAN:  Lucky Cowboy, M.D.                    DATE OF BIRTH:  06-24-1961   DATE OF ADMISSION:  07/05/2003  DATE OF DISCHARGE:  07/11/2003                                 DISCHARGE SUMMARY   DISCHARGE DIAGNOSIS:  Right neck abscess.   HOSPITAL COURSE:  The patient was admitted after outpatient treatment with  oral antibiotics without significant improvement.  After admission, CT scan  was performed which revealed a small phlegmon.  He was treated with IV  Unasyn and reassessed.  CT scan three days later showed ongoing phlegmon.  A  fine-needle aspiration was performed which revealed mixed lymphoid  infiltrate with admixed neutrophils consistent with ongoing abscess.  The  patient was questioned about HIV and TB status which he indicated that [facility]  [facility].  He did undergo laboratory testing  which revealed a normal white blood cell count of 9.4 and normal coagulation  studies with a hemoglobin of 12.5.  During the hospital, initially his  temperature was 99.6 and he was completely afebrile during the remainder of  his hospitalization.  He received five and a half days of IV Unasyn with  significant reduction in the neck swelling.  He was discharged to home with  improved but not completely resolved neck abscess.   DISCHARGE MEDICATIONS:  Discharge medications include home Augmentin, which  he has.   FOLLOWUP:  He is to follow up with Dr. Lucky Cowboy; he is to call the office  for followup later on today.                                                Lucky Cowboy, M.D.    SJ/MEDQ  D:  08/11/2003  T:  08/11/2003  Job:  [id number]

## 2011-03-05 ENCOUNTER — Other Ambulatory Visit: Payer: Self-pay | Admitting: Internal Medicine

## 2011-03-07 ENCOUNTER — Encounter: Payer: Self-pay | Admitting: Endocrinology

## 2011-03-07 ENCOUNTER — Ambulatory Visit (INDEPENDENT_AMBULATORY_CARE_PROVIDER_SITE_OTHER): Payer: BC Managed Care – PPO | Admitting: Endocrinology

## 2011-03-07 ENCOUNTER — Other Ambulatory Visit (INDEPENDENT_AMBULATORY_CARE_PROVIDER_SITE_OTHER): Payer: BC Managed Care – PPO

## 2011-03-07 VITALS — BP 110/78 | HR 78 | Temp 97.8°F | Ht 73.0 in | Wt 165.4 lb

## 2011-03-07 DIAGNOSIS — E0590 Thyrotoxicosis, unspecified without thyrotoxic crisis or storm: Secondary | ICD-10-CM

## 2011-03-07 LAB — TSH: TSH: 28.65 u[IU]/mL — ABNORMAL HIGH (ref 0.35–5.50)

## 2011-03-07 LAB — T4, FREE: Free T4: 0.38 ng/dL — ABNORMAL LOW (ref 0.60–1.60)

## 2011-03-07 MED ORDER — TRAZODONE HCL 150 MG PO TABS: 150.0000 mg | ORAL_TABLET | Freq: Every day | ORAL | Status: DC

## 2011-03-07 NOTE — Patient Instructions (Addendum)
blood tests are being ordered for you today.  please call [phone] to hear your test results.  You will be prompted to enter the 9-digit "MRN" number that appears at the top left of this page, followed by #.  Then you will hear the message. Stop the methimazole Please make a follow-up appointment in 1 month (update: i left message on phone-tree:  rx as we discussed)

## 2011-03-07 NOTE — Progress Notes (Signed)
Subjective:    Patient ID: Mark Dunlap, male    DOB: 1961-07-26, 50 y.o.   MRN: [id number]  HPI Pt is now 6 1/2 mos s/p i-131 rx for hyperthyroidism, due to a multinodular goiter.  His weight increaed to 173 lbs, before it went down again, due to dental procedure.  He is still on the tapazole.   He does not notice the right thyroid mass.  He denies depression. He says the desyrel helps, and he wants to continue. Past Medical History  Diagnosis Date  . THYROID NODULE, RIGHT 03/02/2010  . HYPERTHYROIDISM 02/13/2010  . Adjustment disorder with depressed mood 09/2007  . 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 HYPERTENSION 08/30/2008  . HYPOTENSION 10/31/2009  . Acute prostatitis 05/23/2009  . SPINAL STENOSIS, CERVICAL 08/08/2009  . CERVICAL RADICULOPATHY, LEFT 08/30/2008  . H/O: substance abuse     hx of ETOH/Crack cocaine-none since 11/08 per pt/Does not Drive due to this  . DJD (degenerative joint disease) of knee     left knee  . DJD (degenerative joint disease) of hip     Past Surgical History  Procedure Date  . Right hand i&d 12/07    s/p rdue to abscess-Dr. Amanda Pea    History   Social History  . Marital Status: Single    Spouse Name: N/A    Number of Children: 0  . Years of Education: N/A   Occupational History  . 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 Smoking status: Current Everyday Smoker  . Smokeless tobacco: Not on file  . Alcohol Use: No  . Drug Use: No  . Sexually Active: Not on file   Other Topics Concern  . Not on file   Social History Narrative  . No narrative on file    Current Outpatient Prescriptions on File Prior to Visit  Medication Sig Dispense Refill  . aspirin 81 MG tablet Take 81 mg by mouth daily.        . fluticasone (FLONASE) 50 MCG/ACT nasal spray USE 2 SPRAY/SIDE ONCE DAILY  16 g  1  . lisinopril-hydrochlorothiazide (PRINZIDE,ZESTORETIC) 20-25 MG per tablet Take  1 tablet by mouth daily.        . naproxen (NAPROSYN) 500 MG tablet Take 500 mg by mouth 2 (two) times daily as needed. For pain       . promethazine (PHENERGAN) 25 MG tablet Take 25 mg by mouth every 6 (six) hours as needed. For nausea       . cetirizine (ZYRTEC) 10 MG tablet Take 10 mg by mouth daily.          No Known Allergies  Family History  Problem Relation Age of Onset  . Arthritis Mother   . Hyperlipidemia Mother   . Goiter Mother     resection of benign goiter  . Alcohol abuse Father   . Stroke Father   . Heart disease Father   . Hypertension Sister   . Diabetes Sister   . Goiter Sister     resection of benign goiter  . Diabetes Brother   . Alcohol abuse Brother   . Alcohol abuse Cousin   . Heart disease Other     Aunt    BP 110/78  Pulse 78  Temp(Src) 97.8 F (36.6 C) (Oral)  Ht 6\' 1"  (1.854 m)  Wt 165 lb 6.4 oz (75.025 kg)  BMI 21.82 kg/m2  SpO2 98%    Review of Systems Denies fever and anxiety.    Objective:   Physical Exam GENERAL: no distress Neck - No masses or thyromegaly.  i do not appreciate the thyroid nodule.    Lab Results  Component Value Date   TSH 28.65* 03/07/2011   T4TOTAL 5.5 08/24/2007     Assessment & Plan:  Hyperthyroidism, overcontrolled.  It is unclear if this is due to the tapazole, or to the effect of the i-131 rx Insomnia, well-controlled. Thyroid mass, much better.

## 2011-03-27 ENCOUNTER — Other Ambulatory Visit: Payer: Self-pay | Admitting: Endocrinology

## 2011-03-31 ENCOUNTER — Other Ambulatory Visit: Payer: Self-pay | Admitting: Endocrinology

## 2011-04-04 ENCOUNTER — Other Ambulatory Visit (INDEPENDENT_AMBULATORY_CARE_PROVIDER_SITE_OTHER): Payer: BC Managed Care – PPO

## 2011-04-04 ENCOUNTER — Encounter: Payer: Self-pay | Admitting: Endocrinology

## 2011-04-04 ENCOUNTER — Ambulatory Visit (INDEPENDENT_AMBULATORY_CARE_PROVIDER_SITE_OTHER): Payer: BC Managed Care – PPO | Admitting: Endocrinology

## 2011-04-04 VITALS — BP 122/78 | HR 79 | Temp 97.8°F | Ht 73.5 in | Wt 161.8 lb

## 2011-04-04 DIAGNOSIS — E041 Nontoxic single thyroid nodule: Secondary | ICD-10-CM

## 2011-04-04 NOTE — Progress Notes (Signed)
Subjective:    Patient ID: Mark Dunlap, male    DOB: 1961-04-26, 50 y.o.   MRN: [id number]  HPI Pt is now 9 1/2 mos s/p i-131 rx for hyperthyroidism, due to a multinodular goiter. He is off the tapazole.  He had lost a few more lbs, due to a series of dental procedures.  He does not notice the goiter.   Past Medical History  Diagnosis Date  . THYROID NODULE, RIGHT 03/02/2010  . HYPERTHYROIDISM 02/13/2010  . Adjustment disorder with depressed mood 09/2007  . 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 HYPERTENSION 08/30/2008  . HYPOTENSION 10/31/2009  . Acute prostatitis 05/23/2009  . SPINAL STENOSIS, CERVICAL 08/08/2009  . CERVICAL RADICULOPATHY, LEFT 08/30/2008  . H/O: substance abuse     hx of ETOH/Crack cocaine-none since 11/08 per pt/Does not Drive due to this  . DJD (degenerative joint disease) of knee     left knee  . DJD (degenerative joint disease) of hip     Past Surgical History  Procedure Date  . Right hand i&d 12/07    s/p rdue to abscess-Dr. Amanda Pea    History   Social History  . Marital Status: Single    Spouse Name: N/A    Number of Children: 0  . Years of Education: N/A   Occupational History  . 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 Smoking status: Current Everyday Smoker  . Smokeless tobacco: Not on file  . Alcohol Use: No  . Drug Use: No  . Sexually Active: Not on file   Other Topics Concern  . Not on file   Social History Narrative  . No narrative on file    Current Outpatient Prescriptions on File Prior to Visit  Medication Sig Dispense Refill  . aspirin 81 MG tablet Take 81 mg by mouth daily.        . fluticasone (FLONASE) 50 MCG/ACT nasal spray USE 2 SPRAY/SIDE ONCE DAILY  16 g  1  . lisinopril-hydrochlorothiazide (PRINZIDE,ZESTORETIC) 20-25 MG per tablet Take 1 tablet by mouth daily.        . traZODone (DESYREL) 150 MG tablet Take 1 tablet (150 mg total) by mouth at  bedtime.  30 tablet  11  . DISCONTD: cetirizine (ZYRTEC) 10 MG tablet Take 10 mg by mouth daily.        [name] DISCONTD: naproxen (NAPROSYN) 500 MG tablet Take 500 mg by mouth 2 (two) times daily as needed. For pain       . DISCONTD: promethazine (PHENERGAN) 25 MG tablet Take 25 mg by mouth every 6 (six) hours as needed. For nausea       . DISCONTD: traZODone (DESYREL) 150 MG tablet TAKE 1 TABLET AT BEDTIME AS NEEDED FOR SLEEP  30 tablet  2    No Known Allergies  Family History  Problem Relation Age of Onset  . Arthritis Mother   . Hyperlipidemia Mother   . Goiter Mother     resection of benign goiter  . Alcohol abuse Father   . Stroke Father   . Heart disease Father   . Hypertension Sister   . Diabetes Sister   . Goiter Sister     resection of benign goiter  . Diabetes Brother   . Alcohol abuse Brother   . Alcohol abuse Cousin   . Heart disease Other     Aunt    BP 122/78  Pulse 79  Temp(Src) 97.8 F (36.6 C) (Oral)  Ht 6' 1.5" (1.867 m)  Wt 161 lb 12.8 oz (73.392 kg)  BMI 21.06 kg/m2  SpO2 97% Review of Systems Denies sob    Objective:   Physical Exam GENERAL: no distress Neck: i do not appreciate the goiter.  No palpable nodules.    Assessment & Plan:  Multinodular goiter, much better hypothyroidiism (was due to tapazole vs i-131 rx)--? Improved off tapazole

## 2011-04-04 NOTE — Patient Instructions (Addendum)
blood tests are being ordered for you today.  please call [phone] to hear your test results.  You will be prompted to enter the 9-digit "MRN" number that appears at the top left of this page, followed by #.  Then you will hear the message. Please make a follow-up appointment in 6 weeks. (addendum: pt wants to be called with results)

## 2011-04-24 ENCOUNTER — Other Ambulatory Visit: Payer: Self-pay | Admitting: Internal Medicine

## 2011-04-24 DIAGNOSIS — Z0389 Encounter for observation for other suspected diseases and conditions ruled out: Secondary | ICD-10-CM

## 2011-04-24 DIAGNOSIS — Z1289 Encounter for screening for malignant neoplasm of other sites: Secondary | ICD-10-CM

## 2011-04-24 DIAGNOSIS — Z0000 Encounter for general adult medical examination without abnormal findings: Secondary | ICD-10-CM

## 2011-04-25 ENCOUNTER — Other Ambulatory Visit (INDEPENDENT_AMBULATORY_CARE_PROVIDER_SITE_OTHER): Payer: BC Managed Care – PPO

## 2011-04-25 DIAGNOSIS — Z1289 Encounter for screening for malignant neoplasm of other sites: Secondary | ICD-10-CM

## 2011-04-25 DIAGNOSIS — Z0000 Encounter for general adult medical examination without abnormal findings: Secondary | ICD-10-CM

## 2011-04-25 LAB — BASIC METABOLIC PANEL
CO2: 31 mEq/L (ref 19–32)
Calcium: 9.4 mg/dL (ref 8.4–10.5)
Chloride: 97 mEq/L (ref 96–112)
Glucose, Bld: 88 mg/dL (ref 70–99)
Sodium: 139 mEq/L (ref 135–145)

## 2011-04-25 LAB — CBC WITH DIFFERENTIAL/PLATELET
Basophils Relative: 1.6 % (ref 0.0–3.0)
Eosinophils Relative: 5.8 % — ABNORMAL HIGH (ref 0.0–5.0)
HCT: 36.1 % — ABNORMAL LOW (ref 39.0–52.0)
Hemoglobin: 12.1 g/dL — ABNORMAL LOW (ref 13.0–17.0)
Lymphocytes Relative: 24.3 % (ref 12.0–46.0)
Lymphs Abs: 1.5 10*3/uL (ref 0.7–4.0)
Monocytes Relative: 12.9 % — ABNORMAL HIGH (ref 3.0–12.0)
Neutro Abs: 3.5 10*3/uL (ref 1.4–7.7)
RBC: 3.87 Mil/uL — ABNORMAL LOW (ref 4.22–5.81)
RDW: 13.8 % (ref 11.5–14.6)

## 2011-04-25 LAB — HEPATIC FUNCTION PANEL
Albumin: 3.8 g/dL (ref 3.5–5.2)
Alkaline Phosphatase: 101 U/L (ref 39–117)
Total Protein: 7.5 g/dL (ref 6.0–8.3)

## 2011-04-25 LAB — URINALYSIS
Bilirubin Urine: NEGATIVE
Hgb urine dipstick: NEGATIVE
Ketones, ur: NEGATIVE
Leukocytes, UA: NEGATIVE
Nitrite: NEGATIVE
Urobilinogen, UA: 1 (ref 0.0–1.0)
pH: 6 (ref 5.0–8.0)

## 2011-04-25 LAB — LIPID PANEL
HDL: 44.9 mg/dL (ref >39.00)
LDL Cholesterol: 79 mg/dL (ref 0–99)
Total CHOL/HDL Ratio: 3
Triglycerides: 109 mg/dL (ref 0.0–149.0)
VLDL: 21.8 mg/dL (ref 0.0–40.0)

## 2011-04-25 LAB — TSH: TSH: 1.01 u[IU]/mL (ref 0.35–5.50)

## 2011-04-30 ENCOUNTER — Encounter: Payer: Self-pay | Admitting: Internal Medicine

## 2011-04-30 DIAGNOSIS — Z0001 Encounter for general adult medical examination with abnormal findings: Secondary | ICD-10-CM | POA: Insufficient documentation

## 2011-04-30 DIAGNOSIS — Z0000 Encounter for general adult medical examination without abnormal findings: Secondary | ICD-10-CM

## 2011-05-01 ENCOUNTER — Encounter: Payer: Self-pay | Admitting: Internal Medicine

## 2011-05-02 ENCOUNTER — Encounter: Payer: Self-pay | Admitting: Internal Medicine

## 2011-05-02 ENCOUNTER — Ambulatory Visit (INDEPENDENT_AMBULATORY_CARE_PROVIDER_SITE_OTHER): Payer: BC Managed Care – PPO | Admitting: Internal Medicine

## 2011-05-02 ENCOUNTER — Other Ambulatory Visit: Payer: Self-pay | Admitting: Internal Medicine

## 2011-05-02 VITALS — BP 110/80 | HR 67 | Temp 97.9°F | Ht 73.0 in | Wt 157.2 lb

## 2011-05-02 DIAGNOSIS — Z0000 Encounter for general adult medical examination without abnormal findings: Secondary | ICD-10-CM

## 2011-05-02 MED ORDER — FLUTICASONE PROPIONATE 50 MCG/ACT NA SUSP: 2.0000 | Freq: Every day | NASAL | Status: DC

## 2011-05-02 MED ORDER — TRAZODONE HCL 150 MG PO TABS: 150.0000 mg | ORAL_TABLET | Freq: Every day | ORAL | Status: DC

## 2011-05-02 MED ORDER — LISINOPRIL-HYDROCHLOROTHIAZIDE 20-25 MG PO TABS: 1.0000 | ORAL_TABLET | Freq: Every day | ORAL | Status: DC

## 2011-05-02 NOTE — Progress Notes (Signed)
Subjective:    Patient ID: Mark Dunlap, male    DOB: 23-Apr-1961, 50 y.o.   MRN: [id number]  HPI  Here for wellness and f/u;  Overall doing ok;  Pt denies CP, worsening SOB, DOE, wheezing, orthopnea, PND, worsening LE edema, palpitations, dizziness or syncope.  Pt denies neurological change such as new Headache, facial or extremity weakness.  Pt denies polydipsia, polyuria, or low sugar symptoms. Pt states overall good compliance with treatment and medications, good tolerability, and trying to follow lower cholesterol diet.  Pt denies worsening depressive symptoms, suicidal ideation or panic. No fever, wt loss, night sweats, loss of appetite, or other constitutional symptoms.  Pt states good ability with ADL's, low fall risk, home safety reviewed and adequate, no significant changes in hearing or vision, and occasionally active with exercise.  Has lost a few lbs recently after mult teeth out with oral surgury, plans to take ensure. Does have several wks ongoing nasal allergy symptoms with clear congestion, itch and sneeze, without fever, pain, ST, cough or wheezing. Does have ongoing discomfort to the left hip and knee, and had some sort of falling out with ortho at [facility], doing "ok" with es tylenol for now Past Medical History  Diagnosis Date  . THYROID NODULE, RIGHT 03/02/2010  . HYPERTHYROIDISM 02/13/2010  . Adjustment disorder with depressed mood 09/2007  . 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 HYPERTENSION 08/30/2008  . HYPOTENSION 10/31/2009  . Acute prostatitis 05/23/2009  . SPINAL STENOSIS, CERVICAL 08/08/2009  . CERVICAL RADICULOPATHY, LEFT 08/30/2008  . H/O: substance abuse     hx of ETOH/Crack cocaine-none since 11/08 per pt/Does not Drive due to this  . DJD (degenerative joint disease) of knee     left knee  . DJD (degenerative joint disease) of hip    Past Surgical History  Procedure Date  . Right hand i&d 12/07    s/p  rdue to abscess-Dr. Amanda Pea    reports that he has been smoking.  He does not have any smokeless tobacco history on file. He reports that he does not drink alcohol or use illicit drugs. family history includes Alcohol abuse in his brother, cousin, and father; Arthritis in his mother; Diabetes in his brother and sister; Goiter in his mother and sister; Heart disease in his father and other; Hyperlipidemia in his mother; Hypertension in his sister; and Stroke in his father. No Known Allergies Current Outpatient Prescriptions on File Prior to Visit  Medication Sig Dispense Refill  . aspirin 81 MG tablet Take 81 mg by mouth daily.        . fluticasone (FLONASE) 50 MCG/ACT nasal spray USE 2 SPRAY/SIDE ONCE DAILY  16 g  1  . lisinopril-hydrochlorothiazide (PRINZIDE,ZESTORETIC) 20-25 MG per tablet Take 1 tablet by mouth daily.        . traZODone (DESYREL) 150 MG tablet Take 1 tablet (150 mg total) by mouth at bedtime.  30 tablet  11   Review of Systems Review of Systems  Constitutional: Negative for diaphoresis, activity change, appetite change and unexpected weight change.  HENT: Negative for hearing loss, ear pain, facial swelling, mouth sores and neck stiffness.   Eyes: Negative for pain, redness and visual disturbance.  Respiratory: Negative for shortness of breath and wheezing.   Cardiovascular: Negative for chest pain and palpitations.  Gastrointestinal: Negative for diarrhea, blood in stool, abdominal distention and rectal pain.  Genitourinary: Negative for hematuria, flank pain and  decreased urine volume.  Musculoskeletal: Negative for myalgias and joint swelling.  Skin: Negative for color change and wound.  Neurological: Negative for syncope and numbness.  Hematological: Negative for adenopathy.  Psychiatric/Behavioral: Negative for hallucinations, self-injury, decreased concentration and agitation.      Objective:   Physical Exam BP 110/80  Pulse 67  Temp(Src) 97.9 F (36.6 C)  (Oral)  Ht 6\' 1"  (1.854 m)  Wt 157 lb 4 oz (71.328 kg)  BMI 20.75 kg/m2  SpO2 98% Physical Exam  VS noted Constitutional: Pt is oriented to person, place, and time. Appears well-developed and well-nourished.  HENT:  Head: Normocephalic and atraumatic.  Right Ear: External ear normal.  Left Ear: External ear normal.  Nose: Nose normal.  Mouth/Throat: Oropharynx is clear and moist.  Eyes: Conjunctivae and EOM are normal. Pupils are equal, round, and reactive to light.  Neck: Normal range of motion. Neck supple. No JVD present. No tracheal deviation present.  Cardiovascular: Normal rate, regular rhythm, normal heart sounds and intact distal pulses.   Pulmonary/Chest: Effort normal and breath sounds normal.  Abdominal: Soft. Bowel sounds are normal. There is no tenderness.  Musculoskeletal: Normal range of motion. Exhibits no edema.  Lymphadenopathy:  Has no cervical adenopathy.  Neurological: Pt is alert and oriented to person, place, and time. Pt has normal reflexes. No cranial nerve deficit.  Skin: Skin is warm and dry. No rash noted.  Psychiatric:  Has  normal mood and affect. Behavior is normal.  Left hip and knee with marked crepitus and reduced ROM       Assessment & Plan:

## 2011-05-02 NOTE — Assessment & Plan Note (Signed)

## 2011-05-02 NOTE — Patient Instructions (Addendum)
You are given the work note for being here today Continue all other medications as before You can also take OTC zyrtec as needed for allergies Your medications were sent to the pharmacy Please stop smoking Please return in 6 months, or sooner if needed, if your left hip and knee get worse

## 2011-05-13 ENCOUNTER — Other Ambulatory Visit: Payer: Self-pay

## 2011-05-13 MED ORDER — PROMETHAZINE HCL 25 MG PO TABS: 25.0000 mg | ORAL_TABLET | Freq: Four times a day (QID) | ORAL | Status: DC | PRN

## 2011-05-13 NOTE — Telephone Encounter (Signed)
Pt called requesting refill of medication, please advise  

## 2011-05-13 NOTE — Telephone Encounter (Signed)
Pt advised of Rx and pharmacy 

## 2011-05-16 ENCOUNTER — Ambulatory Visit: Payer: BC Managed Care – PPO | Admitting: Endocrinology

## 2011-07-28 ENCOUNTER — Other Ambulatory Visit: Payer: Self-pay | Admitting: Internal Medicine

## 2011-08-27 ENCOUNTER — Encounter: Payer: Self-pay | Admitting: Internal Medicine

## 2011-08-27 ENCOUNTER — Ambulatory Visit (INDEPENDENT_AMBULATORY_CARE_PROVIDER_SITE_OTHER): Payer: 59 | Admitting: Internal Medicine

## 2011-08-27 VITALS — BP 104/80 | HR 93 | Temp 97.9°F | Ht 73.5 in | Wt 152.8 lb

## 2011-08-27 DIAGNOSIS — M1612 Unilateral primary osteoarthritis, left hip: Secondary | ICD-10-CM | POA: Insufficient documentation

## 2011-08-27 DIAGNOSIS — IMO0002 Reserved for concepts with insufficient information to code with codable children: Secondary | ICD-10-CM

## 2011-08-27 DIAGNOSIS — M169 Osteoarthritis of hip, unspecified: Secondary | ICD-10-CM

## 2011-08-27 DIAGNOSIS — G8929 Other chronic pain: Secondary | ICD-10-CM | POA: Insufficient documentation

## 2011-08-27 DIAGNOSIS — M5412 Radiculopathy, cervical region: Secondary | ICD-10-CM | POA: Insufficient documentation

## 2011-08-27 DIAGNOSIS — I10 Essential (primary) hypertension: Secondary | ICD-10-CM

## 2011-08-27 DIAGNOSIS — M542 Cervicalgia: Secondary | ICD-10-CM | POA: Insufficient documentation

## 2011-08-27 DIAGNOSIS — Z0000 Encounter for general adult medical examination without abnormal findings: Secondary | ICD-10-CM

## 2011-08-27 DIAGNOSIS — M4802 Spinal stenosis, cervical region: Secondary | ICD-10-CM | POA: Insufficient documentation

## 2011-08-27 DIAGNOSIS — M25559 Pain in unspecified hip: Secondary | ICD-10-CM

## 2011-08-27 DIAGNOSIS — M25552 Pain in left hip: Secondary | ICD-10-CM

## 2011-08-27 MED ORDER — TRAMADOL HCL 50 MG PO TABS: 50.0000 mg | ORAL_TABLET | Freq: Four times a day (QID) | ORAL | Status: AC | PRN

## 2011-08-27 NOTE — Assessment & Plan Note (Signed)
stable overall by hx and exam, most recent data reviewed with pt, and pt to continue medical treatment as before  BP Readings from Last 3 Encounters:  08/27/11 104/80  05/02/11 110/80  04/04/11 122/78

## 2011-08-27 NOTE — Progress Notes (Signed)
Subjective:    Patient ID: Mark Dunlap, male    DOB: 1960/12/09, 50 y.o.   MRN: [id number]  HPI  Here to f/u, c/o 1 mo worsening now severe left hip pain, worse to walk, radiates to the groin, ongoing for several yrs but now very difficult to cont to put up with, and still working standing all shift.  No recent missed work, but tylenol not working anymore.  Pt denies chest pain, increased sob or doe, wheezing, orthopnea, PND, increased LE swelling, palpitations, dizziness or syncope.  Pt denies new neurological symptoms such as new headache, or facial or extremity weakness or numbness   Pt denies polydipsia, polyuria, or low sugar symptoms such as weakness or confusion improved with po intake.  Pt states overall good compliance with meds, trying to follow lower cholesterol, diabetic diet, wt overall stable but little exercise however.    Pt continues to have recurring neck and left upper back/periscapular pain for several yrs as well with midl worsening severity adn frequency but without bowel or bladder change, fever, wt loss,  worsening LE pain/numbness/weakness, gait change or falls.   Pt denies fever, wt loss, night sweats, loss of appetite, or other constitutional symptoms  Echart review confirms MRI left hip mod advanced DJD 2010, as well as normal t-spine MRI (but with incidental c-spine prob lower levels stenosis) Past Medical History  Diagnosis Date  . THYROID NODULE, RIGHT 03/02/2010  . HYPERTHYROIDISM 02/13/2010  . Adjustment disorder with depressed mood 09/2007  . 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 HYPERTENSION 08/30/2008  . HYPOTENSION 10/31/2009  . Acute prostatitis 05/23/2009  . SPINAL STENOSIS, CERVICAL 08/08/2009  . CERVICAL RADICULOPATHY, LEFT 08/30/2008  . H/O: substance abuse     hx of ETOH/Crack cocaine-none since 11/08 per pt/Does not Drive due to this  . DJD (degenerative joint disease) of knee     left knee  . DJD  (degenerative joint disease) of hip    Past Surgical History  Procedure Date  . Right hand i&d 12/07    s/p rdue to abscess-Dr. Amanda Pea    reports that he has been smoking.  He does not have any smokeless tobacco history on file. He reports that he does not drink alcohol or use illicit drugs. family history includes Alcohol abuse in his brother, cousin, and father; Arthritis in his mother; Diabetes in his brother and sister; Goiter in his mother and sister; Heart disease in his father and other; Hyperlipidemia in his mother; Hypertension in his sister; and Stroke in his father. No Known Allergies Current Outpatient Prescriptions on File Prior to Visit  Medication Sig Dispense Refill  . aspirin 81 MG tablet Take 81 mg by mouth daily.        . fluticasone (FLONASE) 50 MCG/ACT nasal spray Place 2 sprays into the nose daily.  16 g  5  . lisinopril-hydrochlorothiazide (PRINZIDE,ZESTORETIC) 20-25 MG per tablet TAKE ONE TABLET BY MOUTH EVERY DAY  90 tablet  3  . promethazine (PHENERGAN) 25 MG tablet TAKE ONE TABLET BY MOUTH EVERY 6 HOURS AS NEEDED FOR NAUSEA  30 tablet  3  . traZODone (DESYREL) 150 MG tablet Take 1 tablet (150 mg total) by mouth at bedtime.  90 tablet  3   Review of Systems Review of Systems  Constitutional: Negative for diaphoresis and unexpected weight change.  HENT: Negative for drooling and tinnitus.   Eyes: Negative for photophobia and visual disturbance.  Respiratory: Negative for choking and stridor.   Gastrointestinal: Negative for vomiting and blood in stool.  Genitourinary: Negative for hematuria and decreased urine volume.     Objective:   Physical Exam BP 104/80  Pulse 93  Temp(Src) 97.9 F (36.6 C) (Oral)  Ht 6' 1.5" (1.867 m)  Wt 152 lb 12 oz (69.287 kg)  BMI 19.88 kg/m2  SpO2 98% Physical Exam  VS noted Constitutional: Pt appears well-developed and well-nourished.  HENT: Head: Normocephalic.  Right Ear: External ear normal.  Left Ear: External ear  normal.  Eyes: Conjunctivae and EOM are normal. Pupils are equal, round, and reactive to light.  Neck: Normal range of motion. Neck supple.  Cardiovascular: Normal rate and regular rhythm.   Pulmonary/Chest: Effort normal and breath sounds normal.  Abd:  Soft, NT, non-distended, + BS Neurological: Pt is alert. No cranial nerve deficit. motor/sens/dtr intact to LE;s Skin: Skin is warm. No erythema.  Left hip with pain on passive ROM flexion/int/ext rotation c-spine with mild tender low cervical tender and left paravertebral area tender approx t1-2 levels Psychiatric: Pt behavior is normal. Thought content normal. 1+ nervous    Assessment & Plan:

## 2011-08-27 NOTE — Assessment & Plan Note (Signed)
Ongoing pain with persistnet left upper back pain, I suspect related to c-spine dz, for MRI c-spine and refer orthopedic,  to f/u any worsening symptoms or concerns

## 2011-08-27 NOTE — Patient Instructions (Addendum)
Take all new medications as prescribed Continue all other medications as before You will be contacted regarding the referral for: MRI for the neck, as well as orthopedic referral ([facility])

## 2011-08-27 NOTE — Assessment & Plan Note (Signed)
Suspect flare of left hip DJD pain, for pain control, also refer to ortho for this as well,  to f/u any worsening symptoms or concerns

## 2011-09-02 ENCOUNTER — Ambulatory Visit
Admission: RE | Admit: 2011-09-02 | Discharge: 2011-09-02 | Disposition: A | Discharge status: Home / Self Care | Payer: 59 | Source: Ambulatory Visit | Attending: Internal Medicine | Admitting: Internal Medicine

## 2011-09-02 DIAGNOSIS — M4802 Spinal stenosis, cervical region: Secondary | ICD-10-CM

## 2011-09-02 DIAGNOSIS — M5412 Radiculopathy, cervical region: Secondary | ICD-10-CM

## 2011-09-02 DIAGNOSIS — M542 Cervicalgia: Secondary | ICD-10-CM

## 2011-09-12 ENCOUNTER — Other Ambulatory Visit: Payer: Self-pay | Admitting: Orthopedic Surgery

## 2011-09-12 MED ORDER — CHLORHEXIDINE GLUCONATE 4 % EX LIQD: 60.0000 mL | Freq: Once | CUTANEOUS | Status: DC

## 2011-09-12 MED ORDER — DEXTROSE 5 % IV SOLN: 1.0000 g | INTRAVENOUS | Status: DC

## 2011-09-17 ENCOUNTER — Telehealth: Payer: Self-pay

## 2011-09-17 DIAGNOSIS — M542 Cervicalgia: Secondary | ICD-10-CM

## 2011-09-17 NOTE — Telephone Encounter (Signed)
Patient saw Ortho on 08/30/11. Patient states they only treated him for his hip. He has surgery scheduled for 10/29/2011. The ortho did nothing for his neck, states they told him the referral was only for his hip not hip and neck, Please advise

## 2011-09-17 NOTE — Telephone Encounter (Signed)
Apparently I will need to re-refer for neck pain as well, will do now, as his MRI was signficant for even pain related to the spinal cord

## 2011-09-18 NOTE — Telephone Encounter (Signed)
Patient informed. 

## 2011-09-18 NOTE — Telephone Encounter (Signed)
Called the patient left message to call back 

## 2011-09-24 ENCOUNTER — Encounter (HOSPITAL_COMMUNITY): Payer: Self-pay | Admitting: Pharmacy Technician

## 2011-09-26 ENCOUNTER — Other Ambulatory Visit: Payer: Self-pay

## 2011-09-26 MED ORDER — HYDROCODONE-ACETAMINOPHEN 7.5-500 MG PO TABS: 1.0000 | ORAL_TABLET | Freq: Four times a day (QID) | ORAL | Status: AC | PRN

## 2011-09-26 NOTE — Telephone Encounter (Signed)
The patient is needing something called in for pain. But no codeine as it makes him itch. He is having surgery soon and is in a lot of pain. [facility] [address] Call back number is [phone]

## 2011-09-26 NOTE — Telephone Encounter (Signed)
Done hardcopy to robin  Remember this is hydrocodone, not codeine, so should be ok

## 2011-09-27 NOTE — Telephone Encounter (Signed)
Called informed the patient's mother hardcopy faxed to pharmacy

## 2011-10-03 ENCOUNTER — Encounter (HOSPITAL_COMMUNITY)
Admission: RE | Admit: 2011-10-03 | Discharge: 2011-10-03 | Disposition: A | Discharge status: Home / Self Care | Payer: Medicare Other | Source: Ambulatory Visit | Attending: Neurological Surgery | Admitting: Neurological Surgery

## 2011-10-03 ENCOUNTER — Encounter (HOSPITAL_COMMUNITY): Payer: Self-pay

## 2011-10-03 LAB — SURGICAL PCR SCREEN
MRSA, PCR: NEGATIVE
Staphylococcus aureus: NEGATIVE

## 2011-10-03 LAB — BASIC METABOLIC PANEL
Calcium: 10.3 mg/dL (ref 8.4–10.5)
GFR calc Af Amer: >90 mL/min (ref >90)
GFR calc non Af Amer: 82 mL/min — ABNORMAL LOW (ref >90)
Glucose, Bld: 88 mg/dL (ref 70–99)
Potassium: 4.6 mEq/L (ref 3.5–5.1)
Sodium: 136 mEq/L (ref 135–145)

## 2011-10-03 LAB — CBC
Hemoglobin: 12.1 g/dL — ABNORMAL LOW (ref 13.0–17.0)
MCH: 28.9 pg (ref 26.0–34.0)
Platelets: 449 10*3/uL — ABNORMAL HIGH (ref 150–400)
RBC: 4.18 MIL/uL — ABNORMAL LOW (ref 4.22–5.81)
WBC: 7.2 10*3/uL (ref 4.0–10.5)

## 2011-10-03 MED ORDER — CEFAZOLIN SODIUM 1-5 GM-% IV SOLN: 1.0000 g | INTRAVENOUS | Status: DC

## 2011-10-03 NOTE — Pre-Procedure Instructions (Signed)
20 Mark Dunlap  10/03/2011   Your procedure is scheduled on:  10/11/2011  Report to [name] at 0630 AM.  Call this number if you have problems the morning of surgery: [phone]   Remember:   Do not eat food:After Midnight.  May have clear liquids: up to 4 Hours before arrival.  Clear liquids include soda, tea, black coffee, apple or grape juice, broth.  Take these medicines the morning of surgery with A SIP OF WATER:  none   Do not wear jewelry, make-up or nail polish.  Do not wear lotions, powders, or perfumes. You may wear deodorant.  Do not shave 48 hours prior to surgery.  Do not bring valuables to the hospital.  Contacts, dentures or bridgework may not be worn into surgery.  Leave suitcase in the car. After surgery it may be brought to your room.  For patients admitted to the hospital, checkout time is 11:00 AM the day of discharge.   Patients discharged the day of surgery will not be allowed to drive home.  Name and phone number of your driver: family  Special Instructions: CHG Shower Use Special Wash: 1/2 bottle night before surgery and 1/2 bottle morning of surgery.   Please read over the following fact sheets that you were given: Pain Booklet, Coughing and Deep Breathing, Lab Information, MRSA Information and Surgical Site Infection Prevention

## 2011-10-10 MED ORDER — CEFAZOLIN SODIUM 1-5 GM-% IV SOLN
1.0000 g | INTRAVENOUS | Status: AC
  Administered 2011-10-11: 1 g via INTRAVENOUS
  Filled 2011-10-10: qty 50

## 2011-10-11 ENCOUNTER — Inpatient Hospital Stay (HOSPITAL_COMMUNITY): Payer: Medicare Other

## 2011-10-11 ENCOUNTER — Encounter (HOSPITAL_COMMUNITY): Payer: Self-pay | Admitting: Anesthesiology

## 2011-10-11 ENCOUNTER — Inpatient Hospital Stay (HOSPITAL_COMMUNITY)
Admission: RE | Admit: 2011-10-11 | Discharge: 2011-10-13 | DRG: 473 | Disposition: A | Discharge status: Home / Self Care | Payer: Medicare Other | Source: Ambulatory Visit | Attending: Neurological Surgery | Admitting: Neurological Surgery

## 2011-10-11 ENCOUNTER — Encounter (HOSPITAL_COMMUNITY): Payer: Self-pay | Admitting: *Deleted

## 2011-10-11 ENCOUNTER — Encounter (HOSPITAL_COMMUNITY)
Admission: RE | Disposition: A | Discharge status: Home / Self Care | Payer: Self-pay | Source: Ambulatory Visit | Attending: Neurological Surgery

## 2011-10-11 ENCOUNTER — Inpatient Hospital Stay (HOSPITAL_COMMUNITY): Payer: Medicare Other | Admitting: Anesthesiology

## 2011-10-11 DIAGNOSIS — F17200 Nicotine dependence, unspecified, uncomplicated: Secondary | ICD-10-CM | POA: Diagnosis present

## 2011-10-11 DIAGNOSIS — M4802 Spinal stenosis, cervical region: Secondary | ICD-10-CM | POA: Diagnosis present

## 2011-10-11 DIAGNOSIS — I10 Essential (primary) hypertension: Secondary | ICD-10-CM | POA: Diagnosis present

## 2011-10-11 DIAGNOSIS — E0590 Thyrotoxicosis, unspecified without thyrotoxic crisis or storm: Secondary | ICD-10-CM | POA: Diagnosis present

## 2011-10-11 DIAGNOSIS — M4712 Other spondylosis with myelopathy, cervical region: Principal | ICD-10-CM | POA: Diagnosis present

## 2011-10-11 HISTORY — PX: ANTERIOR CERVICAL DECOMP/DISCECTOMY FUSION: SHX1161

## 2011-10-11 LAB — URINE MICROSCOPIC-ADD ON

## 2011-10-11 LAB — URINALYSIS, ROUTINE W REFLEX MICROSCOPIC
Hgb urine dipstick: NEGATIVE
Specific Gravity, Urine: 1.023 (ref 1.005–1.030)
Urobilinogen, UA: 1 mg/dL (ref 0.0–1.0)

## 2011-10-11 LAB — ABO/RH: ABO/RH(D): B POS

## 2011-10-11 LAB — TYPE AND SCREEN: Antibody Screen: NEGATIVE

## 2011-10-11 SURGERY — ANTERIOR CERVICAL DECOMPRESSION/DISCECTOMY FUSION 3 LEVELS
Anesthesia: General | Wound class: Clean

## 2011-10-11 MED ORDER — HYDROMORPHONE HCL PF 1 MG/ML IJ SOLN
0.2500 mg | INTRAMUSCULAR | Status: DC | PRN
  Administered 2011-10-11 (×4): 0.5 mg via INTRAVENOUS

## 2011-10-11 MED ORDER — LISINOPRIL-HYDROCHLOROTHIAZIDE 20-25 MG PO TABS: 1.0000 | ORAL_TABLET | Freq: Every day | ORAL | Status: DC

## 2011-10-11 MED ORDER — VECURONIUM BROMIDE 10 MG IV SOLR
INTRAVENOUS | Status: DC | PRN
  Administered 2011-10-11 (×3): 1 mg via INTRAVENOUS

## 2011-10-11 MED ORDER — METHOCARBAMOL 100 MG/ML IJ SOLN
500.0000 mg | Freq: Four times a day (QID) | INTRAVENOUS | Status: DC | PRN
  Administered 2011-10-11: 500 mg via INTRAVENOUS
  Filled 2011-10-11 (×2): qty 5

## 2011-10-11 MED ORDER — PROMETHAZINE HCL 25 MG/ML IJ SOLN: 6.2500 mg | INTRAMUSCULAR | Status: DC | PRN

## 2011-10-11 MED ORDER — DEXAMETHASONE SODIUM PHOSPHATE 10 MG/ML IJ SOLN: 10.0000 mg | Freq: Once | INTRAMUSCULAR | Status: DC

## 2011-10-11 MED ORDER — ZOLPIDEM TARTRATE 10 MG PO TABS: 10.0000 mg | ORAL_TABLET | Freq: Every evening | ORAL | Status: DC | PRN

## 2011-10-11 MED ORDER — ROCURONIUM BROMIDE 100 MG/10ML IV SOLN
INTRAVENOUS | Status: DC | PRN
  Administered 2011-10-11: 50 mg via INTRAVENOUS

## 2011-10-11 MED ORDER — PROPOFOL 10 MG/ML IV EMUL
INTRAVENOUS | Status: DC | PRN
  Administered 2011-10-11: 120 mg via INTRAVENOUS

## 2011-10-11 MED ORDER — SODIUM CHLORIDE 0.9 % IR SOLN
Status: DC | PRN
  Administered 2011-10-11: 07:00:00

## 2011-10-11 MED ORDER — PHENYLEPHRINE HCL 10 MG/ML IJ SOLN
INTRAMUSCULAR | Status: DC | PRN
  Administered 2011-10-11 (×3): 40 ug via INTRAVENOUS
  Administered 2011-10-11: 5 ug via INTRAVENOUS
  Administered 2011-10-11 (×2): 40 ug via INTRAVENOUS
  Administered 2011-10-11: 5 ug via INTRAVENOUS
  Administered 2011-10-11: 40 ug via INTRAVENOUS
  Administered 2011-10-11: 80 ug via INTRAVENOUS
  Administered 2011-10-11 (×4): 40 ug via INTRAVENOUS

## 2011-10-11 MED ORDER — ONDANSETRON HCL 4 MG/2ML IJ SOLN: 4.0000 mg | INTRAMUSCULAR | Status: DC | PRN

## 2011-10-11 MED ORDER — LISINOPRIL 20 MG PO TABS
20.0000 mg | ORAL_TABLET | Freq: Every day | ORAL | Status: DC
  Administered 2011-10-11 – 2011-10-12 (×2): 20 mg via ORAL
  Filled 2011-10-11 (×3): qty 1

## 2011-10-11 MED ORDER — ACETAMINOPHEN 650 MG RE SUPP: 650.0000 mg | RECTAL | Status: DC | PRN

## 2011-10-11 MED ORDER — SODIUM CHLORIDE 0.9 % IJ SOLN
3.0000 mL | INTRAMUSCULAR | Status: DC | PRN
  Administered 2011-10-11: 3 mL via INTRAVENOUS

## 2011-10-11 MED ORDER — MEPERIDINE HCL 25 MG/ML IJ SOLN: 6.2500 mg | INTRAMUSCULAR | Status: DC | PRN

## 2011-10-11 MED ORDER — TRAZODONE HCL 150 MG PO TABS
150.0000 mg | ORAL_TABLET | Freq: Every day | ORAL | Status: DC
  Administered 2011-10-12: 150 mg via ORAL
  Filled 2011-10-11 (×4): qty 1

## 2011-10-11 MED ORDER — LACTATED RINGERS IV SOLN
INTRAVENOUS | Status: DC | PRN
  Administered 2011-10-11 (×2): via INTRAVENOUS

## 2011-10-11 MED ORDER — HEMOSTATIC AGENTS (NO CHARGE) OPTIME
TOPICAL | Status: DC | PRN
  Administered 2011-10-11: 1 via TOPICAL

## 2011-10-11 MED ORDER — DEXAMETHASONE SODIUM PHOSPHATE 10 MG/ML IJ SOLN
INTRAMUSCULAR | Status: DC | PRN
  Administered 2011-10-11: 10 mg via INTRAVENOUS

## 2011-10-11 MED ORDER — ACETAMINOPHEN 325 MG PO TABS: 650.0000 mg | ORAL_TABLET | ORAL | Status: DC | PRN

## 2011-10-11 MED ORDER — SODIUM CHLORIDE 0.9 % IV SOLN
INTRAVENOUS | Status: AC
  Filled 2011-10-11: qty 500

## 2011-10-11 MED ORDER — SUFENTANIL CITRATE 50 MCG/ML IV SOLN
INTRAVENOUS | Status: DC | PRN
  Administered 2011-10-11 (×3): 5 ug via INTRAVENOUS
  Administered 2011-10-11: 25 ug via INTRAVENOUS

## 2011-10-11 MED ORDER — BUPIVACAINE HCL (PF) 0.25 % IJ SOLN
INTRAMUSCULAR | Status: DC | PRN
  Administered 2011-10-11: 5 mL

## 2011-10-11 MED ORDER — NEOSTIGMINE METHYLSULFATE 1 MG/ML IJ SOLN
INTRAMUSCULAR | Status: DC | PRN
  Administered 2011-10-11: 5 mg via INTRAVENOUS

## 2011-10-11 MED ORDER — CEFAZOLIN SODIUM 1-5 GM-% IV SOLN
1.0000 g | Freq: Three times a day (TID) | INTRAVENOUS | Status: AC
  Administered 2011-10-11 (×2): 1 g via INTRAVENOUS
  Filled 2011-10-11 (×3): qty 50

## 2011-10-11 MED ORDER — GLYCOPYRROLATE 0.2 MG/ML IJ SOLN
INTRAMUSCULAR | Status: DC | PRN
  Administered 2011-10-11: .6 mg via INTRAVENOUS

## 2011-10-11 MED ORDER — SENNA 8.6 MG PO TABS
1.0000 | ORAL_TABLET | Freq: Two times a day (BID) | ORAL | Status: DC
  Administered 2011-10-11 – 2011-10-12 (×3): 8.6 mg via ORAL
  Filled 2011-10-11 (×7): qty 1

## 2011-10-11 MED ORDER — THROMBIN 5000 UNITS EX KIT
PACK | CUTANEOUS | Status: DC | PRN
  Administered 2011-10-11: 5000 [IU] via TOPICAL

## 2011-10-11 MED ORDER — THROMBIN 20000 UNITS EX KIT
PACK | CUTANEOUS | Status: DC | PRN
  Administered 2011-10-11: 20000 [IU] via TOPICAL

## 2011-10-11 MED ORDER — PHENOL 1.4 % MT LIQD
1.0000 | OROMUCOSAL | Status: DC | PRN
  Administered 2011-10-13: 1 via OROMUCOSAL
  Filled 2011-10-11: qty 177

## 2011-10-11 MED ORDER — MORPHINE SULFATE 4 MG/ML IJ SOLN
1.0000 mg | INTRAMUSCULAR | Status: DC | PRN
  Administered 2011-10-11 – 2011-10-12 (×2): 4 mg via INTRAVENOUS
  Filled 2011-10-11 (×2): qty 1

## 2011-10-11 MED ORDER — POTASSIUM CHLORIDE IN NACL 20-0.9 MEQ/L-% IV SOLN
INTRAVENOUS | Status: DC
  Administered 2011-10-11 – 2011-10-12 (×2): via INTRAVENOUS
  Filled 2011-10-11 (×5): qty 1000

## 2011-10-11 MED ORDER — OXYCODONE-ACETAMINOPHEN 5-325 MG PO TABS
1.0000 | ORAL_TABLET | ORAL | Status: DC | PRN
  Administered 2011-10-11 – 2011-10-13 (×8): 2 via ORAL
  Filled 2011-10-11 (×8): qty 2

## 2011-10-11 MED ORDER — HYDROMORPHONE HCL PF 1 MG/ML IJ SOLN
INTRAMUSCULAR | Status: AC
  Administered 2011-10-11: 0.5 mg via INTRAVENOUS
  Filled 2011-10-11: qty 1

## 2011-10-11 MED ORDER — MENTHOL 3 MG MT LOZG
1.0000 | LOZENGE | OROMUCOSAL | Status: DC | PRN
  Filled 2011-10-11 (×2): qty 9

## 2011-10-11 MED ORDER — SODIUM CHLORIDE 0.9 % IV SOLN: 250.0000 mL | INTRAVENOUS | Status: DC

## 2011-10-11 MED ORDER — METHOCARBAMOL 500 MG PO TABS
500.0000 mg | ORAL_TABLET | Freq: Four times a day (QID) | ORAL | Status: DC | PRN
  Administered 2011-10-11 – 2011-10-13 (×5): 500 mg via ORAL
  Filled 2011-10-11 (×6): qty 1

## 2011-10-11 MED ORDER — HYDROCHLOROTHIAZIDE 25 MG PO TABS
25.0000 mg | ORAL_TABLET | Freq: Every day | ORAL | Status: DC
  Administered 2011-10-11 – 2011-10-12 (×2): 25 mg via ORAL
  Filled 2011-10-11 (×3): qty 1

## 2011-10-11 MED ORDER — FLUTICASONE PROPIONATE 50 MCG/ACT NA SUSP
2.0000 | Freq: Every day | NASAL | Status: DC
  Administered 2011-10-11: 2 via NASAL
  Filled 2011-10-11: qty 16

## 2011-10-11 MED ORDER — DEXAMETHASONE SODIUM PHOSPHATE 10 MG/ML IJ SOLN
INTRAMUSCULAR | Status: AC
  Filled 2011-10-11: qty 1

## 2011-10-11 MED ORDER — SODIUM CHLORIDE 0.9 % IJ SOLN
3.0000 mL | Freq: Two times a day (BID) | INTRAMUSCULAR | Status: DC
  Administered 2011-10-11 – 2011-10-12 (×2): 3 mL via INTRAVENOUS

## 2011-10-11 MED ORDER — CHLORHEXIDINE GLUCONATE 4 % EX LIQD: 60.0000 mL | Freq: Once | CUTANEOUS | Status: DC

## 2011-10-11 MED ORDER — MIDAZOLAM HCL 5 MG/5ML IJ SOLN
INTRAMUSCULAR | Status: DC | PRN
  Administered 2011-10-11: 2 mg via INTRAVENOUS

## 2011-10-11 MED ORDER — ONDANSETRON HCL 4 MG/2ML IJ SOLN
INTRAMUSCULAR | Status: DC | PRN
  Administered 2011-10-11: 4 mg via INTRAVENOUS

## 2011-10-11 MED ORDER — 0.9 % SODIUM CHLORIDE (POUR BTL) OPTIME
TOPICAL | Status: DC | PRN
  Administered 2011-10-11: 1000 mL

## 2011-10-11 MED ORDER — BACITRACIN 50000 UNITS IM SOLR
INTRAMUSCULAR | Status: AC
  Filled 2011-10-11: qty 50000

## 2011-10-11 SURGICAL SUPPLY — 55 items
BAG DECANTER FOR FLEXI CONT (MISCELLANEOUS) ×2 IMPLANT
BENZOIN TINCTURE PRP APPL 2/3 (GAUZE/BANDAGES/DRESSINGS) ×2 IMPLANT
BUR MATCHSTICK NEURO 3.0 LAGG (BURR) ×2 IMPLANT
CANISTER SUCTION 2500CC (MISCELLANEOUS) ×2 IMPLANT
CLOSURE STERI STRIP 1/2 X4 (GAUZE/BANDAGES/DRESSINGS) ×2 IMPLANT
CLOTH BEACON ORANGE TIMEOUT ST (SAFETY) ×2 IMPLANT
CONT SPEC 4OZ CLIKSEAL STRL BL (MISCELLANEOUS) ×2 IMPLANT
DRAIN CHANNEL 7F 3/4 FLAT (WOUND CARE) ×2 IMPLANT
DRAPE C-ARM 42X72 X-RAY (DRAPES) ×4 IMPLANT
DRAPE LAPAROTOMY 100X72 PEDS (DRAPES) ×2 IMPLANT
DRAPE MICROSCOPE ZEISS OPMI (DRAPES) ×2 IMPLANT
DRAPE POUCH INSTRU U-SHP 10X18 (DRAPES) ×2 IMPLANT
DRESSING TELFA 8X3 (GAUZE/BANDAGES/DRESSINGS) ×2 IMPLANT
DRILL BIT HELIX 13MM (BIT) ×2 IMPLANT
DRSG OPSITE 4X5.5 SM (GAUZE/BANDAGES/DRESSINGS) ×2 IMPLANT
DURAPREP 6ML APPLICATOR 50/CS (WOUND CARE) ×2 IMPLANT
ELECT COATED BLADE 2.86 ST (ELECTRODE) ×2 IMPLANT
ELECT REM PT RETURN 9FT ADLT (ELECTROSURGICAL) ×2
ELECTRODE REM PT RTRN 9FT ADLT (ELECTROSURGICAL) ×1 IMPLANT
EVACUATOR SILICONE 100CC (DRAIN) ×2 IMPLANT
GAUZE SPONGE 4X4 16PLY XRAY LF (GAUZE/BANDAGES/DRESSINGS) IMPLANT
GLOVE BIO SURGEON STRL SZ8 (GLOVE) ×2 IMPLANT
GLOVE BIOGEL PI IND STRL 7.0 (GLOVE) ×1 IMPLANT
GLOVE BIOGEL PI INDICATOR 7.0 (GLOVE) ×1
GLOVE INDICATOR 7.0 STRL GRN (GLOVE) ×2 IMPLANT
GLOVE SS BIOGEL STRL SZ 7 (GLOVE) ×1 IMPLANT
GLOVE SUPERSENSE BIOGEL SZ 7 (GLOVE) ×1
GOWN BRE IMP SLV AUR LG STRL (GOWN DISPOSABLE) ×2 IMPLANT
GOWN BRE IMP SLV AUR XL STRL (GOWN DISPOSABLE) ×2 IMPLANT
GOWN STRL REIN 2XL LVL4 (GOWN DISPOSABLE) ×2 IMPLANT
HEAD HALTER (SOFTGOODS) IMPLANT
HEMOSTAT POWDER KIT SURGIFOAM (HEMOSTASIS) ×2 IMPLANT
IMPLT CONSTRUX LORDO 15X12X7MM (Orthopedic Implant) ×2 IMPLANT
IMPLT CONSTRUX LORDO 15X12X8MM (Orthopedic Implant) ×4 IMPLANT
KIT BASIN OR (CUSTOM PROCEDURE TRAY) ×2 IMPLANT
KIT ROOM TURNOVER OR (KITS) ×2 IMPLANT
NEEDLE HYPO 25X1 1.5 SAFETY (NEEDLE) ×2 IMPLANT
NEEDLE SPNL 20GX3.5 QUINCKE YW (NEEDLE) ×2 IMPLANT
NS IRRIG 1000ML POUR BTL (IV SOLUTION) ×2 IMPLANT
PACK LAMINECTOMY NEURO (CUSTOM PROCEDURE TRAY) ×2 IMPLANT
PAD ARMBOARD 7.5X6 YLW CONV (MISCELLANEOUS) ×2 IMPLANT
PLATE HELIX T 56 (Plate) ×1 IMPLANT
PLATE HELIX T 56MM (Plate) ×2 IMPLANT
PUTTY 2.5ML ACTIFUSE ABX (Putty) ×2 IMPLANT
RUBBERBAND STERILE (MISCELLANEOUS) ×4 IMPLANT
SCREW FIXED SELF TAP 4X15 (Screw) ×8 IMPLANT
SPONGE INTESTINAL PEANUT (DISPOSABLE) ×2 IMPLANT
STRIP CLOSURE SKIN 1/2X4 (GAUZE/BANDAGES/DRESSINGS) ×2 IMPLANT
SUT VIC AB 3-0 SH 8-18 (SUTURE) ×2 IMPLANT
SYR 20ML ECCENTRIC (SYRINGE) IMPLANT
TOWEL OR 17X24 6PK STRL BLUE (TOWEL DISPOSABLE) ×2 IMPLANT
TOWEL OR 17X26 10 PK STRL BLUE (TOWEL DISPOSABLE) ×2 IMPLANT
TRAP SPECIMEN MUCOUS 40CC (MISCELLANEOUS) IMPLANT
WATER STERILE IRR 1000ML POUR (IV SOLUTION) ×2 IMPLANT
variable screws 4.0x15 (Screw) ×8 IMPLANT

## 2011-10-11 NOTE — Transfer of Care (Signed)
Immediate Anesthesia Transfer of Care Note  Patient: Mark Dunlap  Procedure(s) Performed:  ANTERIOR CERVICAL DECOMPRESSION/DISCECTOMY FUSION 3 LEVELS - Cervical three-four ,cervical four five cervical five six Anterior Cervical Decompression Fusion with peek + plate Nuvasive translational plate Orthofix peek (2 1/2 hours) Rm # [id number]  Patient Location: PACU  Anesthesia Type: General  Level of Consciousness: awake  Airway & Oxygen Therapy: Patient Spontanous Breathing and Patient connected to nasal cannula oxygen  Post-op Assessment: Report given to PACU RN and Post -op Vital signs reviewed and stable  Post vital signs: stable  Complications: No apparent anesthesia complications

## 2011-10-11 NOTE — Op Note (Signed)
10/11/2011  11:15 AM  PATIENT:  Mark Dunlap  50 y.o. 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 Decompressive anterior cervical discectomy C3-4 C4-5 C5-6 for central canal decompression 2. anterior cervical arthrodesis C3-4 C4-5 C5-6 utilizing peek interbody cages packed with local autograft and morcellized allograft, 3. Posterior cervical plating C3-C6 utilizing the Nuvasive translational plate  SURGEON:  Marikay Alar, MD  ASSISTANTS: None  ANESTHESIA:   General  EBL: 50 ml  Total I/O In: 1500 [I.V.:1500] Out: 75 [Blood:75]  BLOOD ADMINISTERED:none  DRAINS: J-P   SPECIMEN:  No Specimen  INDICATION FOR PROCEDURE: This gentleman presented with neck and left arm pain. MRI showed severe spondylosis with severe stenosis with signal change in the spinal cord. I recommended a 3 level anterior cervical discectomy and fusion with plating.  Patient understood the risks, benefits, and alternatives and potential outcomes and wished to proceed.  PROCEDURE DETAILS: Patient was brought to the operating room placed under general endotracheal anesthesia. Patient was placed in the supine position on the operating room table. The neck was prepped with Duraprep and draped in a sterile fashion.   Three cc of local anesthesia was injected and a transverse incision was made on the right side of the neck.  Dissection was carried down thru the subcutaneous tissue and the platysma was  elevated, opened, and undermined with Metzenbaum scissors.  Dissection was then carried out thru an avascular plane leaving the sternocleidomastoid carotid artery and jugular vein laterally and the trachea and esophagus medially. The ventral aspect of the vertebral column was identified and a localizing x-ray was taken. The C3-4 C4-5 C5-6 level was identified. The longus colli muscles were then  elevated and the retractor was placed. The annulus was incised and the disc space entered. Discectomy was performed with micro-curettes and pituitary rongeurs. I then used the high-speed drill to drill the endplates down to the level of the posterior longitudinal ligament. The drill shavings were saved in a mucous trap for later arthrodesis. The operating microscope was draped and brought into the field provided additional magnification, illumination and visualization. Discectomy was continued posteriorly thru the disc space at each disc. The exact same discectomy and decompression was done at each of the 3 levels. Posterior longitudinal ligament was opened with a nerve hook, and then removed along with disc herniation and osteophytes, decompressing the spinal canal and thecal sac. We then continued to remove osteophytic overgrowth and disc material decompressing the neural foramina and exiting nerve roots bilaterally. The scope was angled up and down to help decompress and undercut the vertebral bodies. Once the decompression was completed we could pass a nerve hook circumferentially to assure adequate decompression in the midline and in the neural foramina. So by both visualization and palpation we felt we had an adequate decompression of the neural elements. We then measured the height of the intravertebral disc space and selected a 8 millimeter Peek interbody cage packed with autograft and morcellized allograft. It was then gently positioned in the intravertebral disc space and countersunk at each level. I then used a translational plate and placed fixed angle screws into the vertebral bodies and locked them into position. The wound was irrigated with bacitracin solution, checked for hemostasis which was established and confirmed. A 7 flat JP drain was placed. Once meticulous hemostasis was achieved, we then proceeded with closure. The platysma was closed with interrupted 3-0  undyed Vicryl suture, the  subcuticular layer was closed with interrupted 3-0 undyed Vicryl suture. The skin edges were approximated with steristrips. The drapes were removed. A sterile dressing was applied. The patient was then awakened from general anesthesia and transferred to the recovery room in stable condition. At the end of the procedure all sponge, needle and instrument counts were correct.   PLAN OF CARE: Admit to inpatient   PATIENT DISPOSITION:  PACU - hemodynamically stable.   Delay start of Pharmacological VTE agent (>24hrs) due to surgical blood loss or risk of bleeding:  yes
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
## 2011-10-11 NOTE — Progress Notes (Signed)
Patient ID: Mark Dunlap, male   DOB: 12/06/1960, 50 y.o.   MRN: [id number]   Looks great post-op. Appropriate neck soreness. Hands feel better. No arm pain. Denies N/T/W. Grips strong. MAE x4.

## 2011-10-11 NOTE — Anesthesia Postprocedure Evaluation (Signed)
  Anesthesia Post-op Note  Patient: Mark Dunlap  Procedure(s) Performed:  ANTERIOR CERVICAL DECOMPRESSION/DISCECTOMY FUSION 3 LEVELS - Cervical three-four ,cervical four five cervical five six Anterior Cervical Decompression Fusion with peek + plate Nuvasive translational plate Orthofix peek (2 1/2 hours) Rm # [id number]  Patient Location: PACU  Anesthesia Type: General  Level of Consciousness: awake  Airway and Oxygen Therapy: Patient Spontanous Breathing and Patient connected to nasal cannula oxygen  Post-op Pain: mild  Post-op Assessment: Post-op Vital signs reviewed, Patient's Cardiovascular Status Stable, Respiratory Function Stable, Patent Airway and No signs of Nausea or vomiting  Post-op Vital Signs: Reviewed and stable  Complications: No apparent anesthesia complications

## 2011-10-11 NOTE — Anesthesia Preprocedure Evaluation (Addendum)
Anesthesia Evaluation  Patient identified by MRN, date of birth, ID band Patient awake    Reviewed: Allergy & Precautions, H&P , NPO status , Patient's Chart, lab work & pertinent test results  Airway Mallampati: II TM Distance: >3 FB Neck ROM: Full    Dental No notable dental hx. (+) Edentulous Upper   Pulmonary neg pulmonary ROS,  clear to auscultation  Pulmonary exam normal       Cardiovascular hypertension, On Medications Regular Normal    Neuro/Psych PSYCHIATRIC DISORDERS  Neuromuscular disease    GI/Hepatic negative GI ROS, Neg liver ROS,   Endo/Other  Negative Endocrine ROSHyperthyroidism   Renal/GU negative Renal ROS  Genitourinary negative   Musculoskeletal   Abdominal   Peds  Hematology negative hematology ROS (+)   Anesthesia Other Findings   Reproductive/Obstetrics negative OB ROS                           Anesthesia Physical Anesthesia Plan  ASA: II  Anesthesia Plan: General   Post-op Pain Management:    Induction: Intravenous  Airway Management Planned: Oral ETT  Additional Equipment:   Intra-op Plan:   Post-operative Plan: Extubation in OR  Informed Consent: I have reviewed the patients History and Physical, chart, labs and discussed the procedure including the risks, benefits and alternatives for the proposed anesthesia with the patient or authorized representative who has indicated his/her understanding and acceptance.     Plan Discussed with: CRNA  Anesthesia Plan Comments:         Anesthesia Quick Evaluation

## 2011-10-11 NOTE — Preoperative (Signed)
Beta Blockers   Reason not to administer Beta Blockers:Not Applicable 

## 2011-10-11 NOTE — Anesthesia Procedure Notes (Signed)
Procedure Name: Intubation Date/Time: 10/11/2011 7:38 AM Performed by: Romie Minus Pre-anesthesia Checklist: Patient identified, Emergency Drugs available, Suction available and Patient being monitored Patient Re-evaluated:Patient Re-evaluated prior to inductionOxygen Delivery Method: Circle System Utilized Preoxygenation: Pre-oxygenation with 100% oxygen Intubation Type: IV induction Ventilation: Mask ventilation without difficulty Grade View: Grade I Tube type: Oral Tube size: 7.5 mm Number of attempts: 1 Airway Equipment and Method: video-laryngoscopy Placement Confirmation: positive ETCO2 and breath sounds checked- equal and bilateral Secured at: 23 cm Tube secured with: Tape Dental Injury: Teeth and Oropharynx as per pre-operative assessment  Comments: Head and neck maintained in neutral position during mask and intubation.

## 2011-10-12 MED ORDER — DIPHENHYDRAMINE HCL 25 MG PO CAPS: 25.0000 mg | ORAL_CAPSULE | Freq: Four times a day (QID) | ORAL | Status: DC | PRN

## 2011-10-12 MED ORDER — HYDROMORPHONE HCL PF 1 MG/ML IJ SOLN: 1.0000 mg | INTRAMUSCULAR | Status: DC | PRN

## 2011-10-12 NOTE — Progress Notes (Signed)
Patient given morphine IV per request related to pain.  Patient starting scratching around IV site and up arm.  No redness noted, no respiratory distress noted.  Decreased with cold compress.  MD notified.  New orders provided see MAR.  Will continue to monitor.  Osvaldo Angst, RN

## 2011-10-12 NOTE — Progress Notes (Signed)
Patient ID: Mark Dunlap, male   DOB: August 04, 1961, 50 y.o.   MRN: [id number]   Subjective: Patient reports appropriate neck pain, no arm pain, No N/T/W  Objective: Vital signs in last 24 hours: Temp:  [97.5 F (36.4 C)-98.6 F (37 C)] 98.1 F (36.7 C) (12/29 0500) Pulse Rate:  [68-90] 75  (12/29 0500) Resp:  [10-20] 17  (12/29 0500) BP: (110-129)/(56-88) 116/56 mmHg (12/29 0500) SpO2:  [95 %-100 %] 95 % (12/29 0500) Weight:  [97.523 kg (215 lb)] 215 lb (97.523 kg) (12/28 1631)  Intake/Output from previous day: 12/28 0701 - 12/29 0700 In: 2822.5 [I.V.:2722.5; IV Piggyback:100] Out: 3570 [Urine:3370; Drains:125; Blood:75] Intake/Output this shift:    Neurologic: Grossly normal Incision CDI  Lab Results: Lab Results  Component Value Date   WBC 7.2 10/03/2011   HGB 12.1* 10/03/2011   HCT 37.0* 10/03/2011   MCV 88.5 10/03/2011   PLT 449* 10/03/2011   Lab Results  Component Value Date   INR 1.10 10/11/2011   BMET Lab Results  Component Value Date   NA 136 10/03/2011   K 4.6 10/03/2011   CL 99 10/03/2011   CO2 27 10/03/2011   GLUCOSE 88 10/03/2011   BUN 12 10/03/2011   CREATININE 1.03 10/03/2011   CALCIUM 10.3 10/03/2011    Studies/Results: Dg Cervical Spine 1 View  10/11/2011  *RADIOLOGY REPORT*  Clinical Data: C3-C6 ACDF  DG SPINE PORTABLE - 1 VIEW  Comparison: MRI 11/19  Findings: Initial lateral view shows a needle marking the anterior disc space at C3-4.  Second film shows ACDF at C3-4, C4-5 and C5-6. Interbody fusion material is in place.  There is an anterior plate with screw fixation.  IMPRESSION: ACDF C3-C6  Original Report Authenticated By: Thomasenia Sales, M.D.   Dg C-arm Gt 120 Min  10/11/2011  CLINICAL DATA: anterior cervical 3-6   C-ARM GT 120 MIN  Fluoroscopy was utilized by the requesting physician.  No radiographic  interpretation.      Assessment/Plan: Doing well, likely home tomorrow   LOS: 1 day    Chariah Bailey S 10/12/2011, 8:48  AM

## 2011-10-13 MED ORDER — ONDANSETRON HCL 4 MG PO TABS: 4.0000 mg | ORAL_TABLET | ORAL | Status: DC | PRN

## 2011-10-13 MED ORDER — METHOCARBAMOL 500 MG PO TABS: 500.0000 mg | ORAL_TABLET | Freq: Four times a day (QID) | ORAL | Status: AC | PRN

## 2011-10-13 MED ORDER — OXYCODONE-ACETAMINOPHEN 5-325 MG PO TABS: 1.0000 | ORAL_TABLET | ORAL | Status: AC | PRN

## 2011-10-13 NOTE — Progress Notes (Signed)
Patient discharged home with instructions and prescriptions.  Patient will call for follow up appointment.  No questions at this time.  Patient left unit in wheelchair in stable condition with dentures glasses and personal belongings in stable condition.  Osvaldo Angst, RN

## 2011-10-13 NOTE — Discharge Summary (Signed)
Physician Discharge Summary  Patient ID: Mark Dunlap MRN: [id number] DOB/AGE: 51-Mar-1962 51 y.o.  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 *    Discharged Condition: good  Hospital Course: admitted day of surgeruy - underwent procedure above - no complications - pt had drain removed on 2nd day - less arm pain doing well - voice good, swalloing well  Consults: none  Significant Diagnostic Studies: none  Treatments: surgery: PROCEDURE: 1. Decompressive anterior cervical discectomy C3-4 C4-5 C5-6 for central canal decompression 2. anterior cervical arthrodesis C3-4 C4-5 C5-6 utilizing peek interbody cages packed with local autograft and morcellized allograft, 3. Posterior cervical plating C3-C6 utilizing the Nuvasive translational plate   Discharge Exam: Blood pressure 169/88, pulse 96, temperature 97.8 F (36.6 C), temperature source Oral, resp. rate 20, height 5\' 7"  (1.702 m), weight 97.523 kg (215 lb), SpO2 96.00%. Wound: c/d/i  Disposition: home   Current Discharge Medication List    START taking these medications   Details  methocarbamol (ROBAXIN) 500 MG tablet Take 1 tablet (500 mg total) by mouth every 6 (six) hours as needed. Qty: 50 tablet, Refills: 2    oxyCODONE-acetaminophen (PERCOCET) 5-325 MG per tablet Take 1-2 tablets by mouth every 4 (four) hours as needed for pain. Qty: 41 tablet, Refills: 0      CONTINUE these medications which have NOT CHANGED   Details  aspirin 81 MG tablet Take 81 mg by mouth daily.      fluticasone (FLONASE) 50 MCG/ACT nasal spray Place 2 sprays into the nose daily. Qty: 16 g, Refills: 5    lisinopril-hydrochlorothiazide (PRINZIDE,ZESTORETIC) 20-25 MG  per tablet Take 1 tablet by mouth daily.      promethazine (PHENERGAN) 25 MG tablet Take 25 mg by mouth every 6 (six) hours as needed. For nausea     traZODone (DESYREL) 150 MG tablet Take 150 mg by mouth at bedtime.           Signed: Clydene Fake, MD 10/13/2011, 10:15 AM

## 2011-10-16 ENCOUNTER — Encounter (HOSPITAL_COMMUNITY): Payer: Self-pay | Admitting: Neurological Surgery

## 2011-10-22 ENCOUNTER — Other Ambulatory Visit (HOSPITAL_COMMUNITY): Payer: 59

## 2011-10-29 ENCOUNTER — Inpatient Hospital Stay: Admit: 2011-10-29 | Payer: Self-pay | Admitting: Orthopedic Surgery

## 2011-10-29 SURGERY — ARTHROPLASTY, HIP, TOTAL,POSTERIOR APPROACH
Anesthesia: General | Laterality: Left

## 2011-11-12 ENCOUNTER — Ambulatory Visit
Admission: RE | Admit: 2011-11-12 | Discharge: 2011-11-12 | Disposition: A | Discharge status: Home / Self Care | Payer: Medicare Other | Source: Ambulatory Visit | Attending: Neurological Surgery | Admitting: Neurological Surgery

## 2011-11-12 ENCOUNTER — Other Ambulatory Visit: Payer: Self-pay | Admitting: Neurological Surgery

## 2011-11-12 DIAGNOSIS — M542 Cervicalgia: Secondary | ICD-10-CM

## 2011-11-22 ENCOUNTER — Other Ambulatory Visit: Payer: Self-pay | Admitting: Orthopedic Surgery

## 2011-12-06 ENCOUNTER — Encounter (HOSPITAL_COMMUNITY): Payer: Self-pay

## 2011-12-06 ENCOUNTER — Encounter (HOSPITAL_COMMUNITY)
Admission: RE | Admit: 2011-12-06 | Discharge: 2011-12-06 | Disposition: A | Discharge status: Home / Self Care | Payer: Medicare Other | Source: Ambulatory Visit | Attending: Orthopedic Surgery | Admitting: Orthopedic Surgery

## 2011-12-06 ENCOUNTER — Other Ambulatory Visit: Payer: Self-pay

## 2011-12-06 ENCOUNTER — Ambulatory Visit (HOSPITAL_COMMUNITY)
Admission: RE | Admit: 2011-12-06 | Discharge: 2011-12-06 | Disposition: A | Discharge status: Home / Self Care | Payer: Medicare Other | Source: Ambulatory Visit | Attending: Orthopedic Surgery | Admitting: Orthopedic Surgery

## 2011-12-06 DIAGNOSIS — I10 Essential (primary) hypertension: Secondary | ICD-10-CM | POA: Insufficient documentation

## 2011-12-06 DIAGNOSIS — Z01810 Encounter for preprocedural cardiovascular examination: Secondary | ICD-10-CM | POA: Insufficient documentation

## 2011-12-06 DIAGNOSIS — Z01818 Encounter for other preprocedural examination: Secondary | ICD-10-CM | POA: Insufficient documentation

## 2011-12-06 DIAGNOSIS — Z01812 Encounter for preprocedural laboratory examination: Secondary | ICD-10-CM | POA: Insufficient documentation

## 2011-12-06 LAB — CBC
MCH: 28.2 pg (ref 26.0–34.0)
MCHC: 31.9 g/dL (ref 30.0–36.0)
MCV: 88.5 fL (ref 78.0–100.0)
Platelets: 448 10*3/uL — ABNORMAL HIGH (ref 150–400)
RBC: 3.83 MIL/uL — ABNORMAL LOW (ref 4.22–5.81)
RDW: 14.9 % (ref 11.5–15.5)

## 2011-12-06 LAB — COMPREHENSIVE METABOLIC PANEL
ALT: 14 U/L (ref 0–53)
AST: 16 U/L (ref 0–37)
Alkaline Phosphatase: 110 U/L (ref 39–117)
CO2: 28 mEq/L (ref 19–32)
Calcium: 10.1 mg/dL (ref 8.4–10.5)
Chloride: 101 mEq/L (ref 96–112)
GFR calc Af Amer: >90 mL/min (ref >90)
GFR calc non Af Amer: >90 mL/min (ref >90)
Glucose, Bld: 107 mg/dL — ABNORMAL HIGH (ref 70–99)
Potassium: 4.3 mEq/L (ref 3.5–5.1)
Sodium: 137 mEq/L (ref 135–145)
Total Bilirubin: 0.2 mg/dL — ABNORMAL LOW (ref 0.3–1.2)

## 2011-12-06 LAB — PROTIME-INR: Prothrombin Time: 13.2 seconds (ref 11.6–15.2)

## 2011-12-06 LAB — SURGICAL PCR SCREEN: Staphylococcus aureus: NEGATIVE

## 2011-12-06 LAB — TYPE AND SCREEN: Antibody Screen: NEGATIVE

## 2011-12-06 NOTE — Pre-Procedure Instructions (Signed)
20 BARNABY RIPPEON  12/06/2011   Your procedure is scheduled on:  Tuesday December 17, 2011  Report to [name] at 0530 AM.  Call this number if you have problems the morning of surgery: [phone]   Remember:   Do not eat food:After Midnight.  May have clear liquids: up to 4 Hours before arrival.  Clear liquids include soda, tea, black coffee, apple or grape juice, broth.  Take these medicines the morning of surgery with A SIP OF WATER: flonase, oxycodone, methocarbamol   Do not wear jewelry, make-up or nail polish.  Do not wear lotions, powders, or perfumes. You may wear deodorant.  Do not shave 48 hours prior to surgery.  Do not bring valuables to the hospital.  Contacts, dentures or bridgework may not be worn into surgery.  Leave suitcase in the car. After surgery it may be brought to your room.  For patients admitted to the hospital, checkout time is 11:00 AM the day of discharge.   Patients discharged the day of surgery will not be allowed to drive home.  Name and phone number of your driver: [facility]  Special Instructions: Incentive Spirometry - Practice and bring it with you on the day of surgery. and CHG Shower Use Special Wash: 1/2 bottle night before surgery and 1/2 bottle morning of surgery.   Please read over the following fact sheets that you were given: Pain Booklet, Coughing and Deep Breathing, Blood Transfusion Information, Total Joint Packet, MRSA Information and Surgical Site Infection Prevention

## 2011-12-10 ENCOUNTER — Telehealth: Payer: Self-pay | Admitting: *Deleted

## 2011-12-10 NOTE — Telephone Encounter (Signed)
error 

## 2011-12-16 MED ORDER — CEFAZOLIN SODIUM 1-5 GM-% IV SOLN
1.0000 g | INTRAVENOUS | Status: AC
  Administered 2011-12-17: 1 g via INTRAVENOUS
  Filled 2011-12-16: qty 50

## 2011-12-17 ENCOUNTER — Inpatient Hospital Stay (HOSPITAL_COMMUNITY)
Admission: RE | Admit: 2011-12-17 | Discharge: 2011-12-20 | DRG: 470 | Disposition: A | Discharge status: Skilled Nursing Facility | Payer: Medicare Other | Source: Ambulatory Visit | Attending: Orthopedic Surgery | Admitting: Orthopedic Surgery

## 2011-12-17 ENCOUNTER — Ambulatory Visit (HOSPITAL_COMMUNITY): Payer: Medicare Other

## 2011-12-17 ENCOUNTER — Ambulatory Visit (HOSPITAL_COMMUNITY): Payer: Medicare Other | Admitting: Anesthesiology

## 2011-12-17 ENCOUNTER — Encounter (HOSPITAL_COMMUNITY): Payer: Self-pay | Admitting: Anesthesiology

## 2011-12-17 ENCOUNTER — Encounter (HOSPITAL_COMMUNITY)
Admission: RE | Disposition: A | Discharge status: Skilled Nursing Facility | Payer: Self-pay | Source: Ambulatory Visit | Attending: Orthopedic Surgery

## 2011-12-17 DIAGNOSIS — E0590 Thyrotoxicosis, unspecified without thyrotoxic crisis or storm: Secondary | ICD-10-CM | POA: Diagnosis present

## 2011-12-17 DIAGNOSIS — Z7982 Long term (current) use of aspirin: Secondary | ICD-10-CM

## 2011-12-17 DIAGNOSIS — M1710 Unilateral primary osteoarthritis, unspecified knee: Principal | ICD-10-CM | POA: Diagnosis present

## 2011-12-17 DIAGNOSIS — Z7901 Long term (current) use of anticoagulants: Secondary | ICD-10-CM

## 2011-12-17 DIAGNOSIS — Z01812 Encounter for preprocedural laboratory examination: Secondary | ICD-10-CM

## 2011-12-17 DIAGNOSIS — Z886 Allergy status to analgesic agent status: Secondary | ICD-10-CM

## 2011-12-17 DIAGNOSIS — Z833 Family history of diabetes mellitus: Secondary | ICD-10-CM

## 2011-12-17 DIAGNOSIS — D62 Acute posthemorrhagic anemia: Secondary | ICD-10-CM | POA: Diagnosis not present

## 2011-12-17 DIAGNOSIS — F17200 Nicotine dependence, unspecified, uncomplicated: Secondary | ICD-10-CM | POA: Diagnosis present

## 2011-12-17 DIAGNOSIS — I10 Essential (primary) hypertension: Secondary | ICD-10-CM | POA: Diagnosis present

## 2011-12-17 DIAGNOSIS — M1612 Unilateral primary osteoarthritis, left hip: Secondary | ICD-10-CM

## 2011-12-17 DIAGNOSIS — E871 Hypo-osmolality and hyponatremia: Secondary | ICD-10-CM | POA: Diagnosis present

## 2011-12-17 DIAGNOSIS — Z23 Encounter for immunization: Secondary | ICD-10-CM

## 2011-12-17 DIAGNOSIS — Z8249 Family history of ischemic heart disease and other diseases of the circulatory system: Secondary | ICD-10-CM

## 2011-12-17 HISTORY — PX: TOTAL HIP ARTHROPLASTY: SHX124

## 2011-12-17 SURGERY — ARTHROPLASTY, HIP, TOTAL,POSTERIOR APPROACH
Anesthesia: General | Site: Hip | Laterality: Left | Wound class: Clean

## 2011-12-17 MED ORDER — MENTHOL 3 MG MT LOZG: 1.0000 | LOZENGE | OROMUCOSAL | Status: DC | PRN

## 2011-12-17 MED ORDER — LISINOPRIL 20 MG PO TABS
20.0000 mg | ORAL_TABLET | Freq: Every day | ORAL | Status: DC
  Administered 2011-12-17 – 2011-12-20 (×4): 20 mg via ORAL
  Filled 2011-12-17 (×4): qty 1

## 2011-12-17 MED ORDER — ENOXAPARIN SODIUM 40 MG/0.4ML ~~LOC~~ SOLN
40.0000 mg | SUBCUTANEOUS | Status: DC
  Administered 2011-12-17 – 2011-12-19 (×3): 40 mg via SUBCUTANEOUS
  Filled 2011-12-17 (×4): qty 0.4

## 2011-12-17 MED ORDER — PHENOL 1.4 % MT LIQD: 1.0000 | OROMUCOSAL | Status: DC | PRN

## 2011-12-17 MED ORDER — LISINOPRIL-HYDROCHLOROTHIAZIDE 20-25 MG PO TABS: 1.0000 | ORAL_TABLET | Freq: Every day | ORAL | Status: DC

## 2011-12-17 MED ORDER — NEOSTIGMINE METHYLSULFATE 1 MG/ML IJ SOLN
INTRAMUSCULAR | Status: DC | PRN
  Administered 2011-12-17: 4 mg via INTRAVENOUS

## 2011-12-17 MED ORDER — BIMATOPROST 0.01 % OP SOLN
1.0000 [drp] | Freq: Every day | OPHTHALMIC | Status: DC
  Filled 2011-12-17: qty 2.5

## 2011-12-17 MED ORDER — GLYCOPYRROLATE 0.2 MG/ML IJ SOLN
INTRAMUSCULAR | Status: DC | PRN
  Administered 2011-12-17: 0.6 mg via INTRAVENOUS

## 2011-12-17 MED ORDER — ZOLPIDEM TARTRATE 5 MG PO TABS: 5.0000 mg | ORAL_TABLET | Freq: Every evening | ORAL | Status: DC | PRN

## 2011-12-17 MED ORDER — HYDROCHLOROTHIAZIDE 25 MG PO TABS
25.0000 mg | ORAL_TABLET | Freq: Every day | ORAL | Status: DC
  Administered 2011-12-17 – 2011-12-20 (×4): 25 mg via ORAL
  Filled 2011-12-17 (×4): qty 1

## 2011-12-17 MED ORDER — ROCURONIUM BROMIDE 100 MG/10ML IV SOLN
INTRAVENOUS | Status: DC | PRN
  Administered 2011-12-17: 50 mg via INTRAVENOUS

## 2011-12-17 MED ORDER — WARFARIN SODIUM 5 MG PO TABS: 5.0000 mg | ORAL_TABLET | Freq: Every day | ORAL | Status: DC

## 2011-12-17 MED ORDER — TRAZODONE HCL 150 MG PO TABS
150.0000 mg | ORAL_TABLET | Freq: Every day | ORAL | Status: DC
  Administered 2011-12-17 – 2011-12-19 (×3): 150 mg via ORAL
  Filled 2011-12-17 (×4): qty 1

## 2011-12-17 MED ORDER — POTASSIUM CHLORIDE IN NACL 20-0.45 MEQ/L-% IV SOLN
INTRAVENOUS | Status: DC
  Administered 2011-12-17 – 2011-12-18 (×2): via INTRAVENOUS
  Administered 2011-12-18: 20 mL via INTRAVENOUS
  Filled 2011-12-17 (×9): qty 1000

## 2011-12-17 MED ORDER — HYDROMORPHONE HCL PF 1 MG/ML IJ SOLN
0.5000 mg | INTRAMUSCULAR | Status: DC | PRN
  Administered 2011-12-17 – 2011-12-19 (×10): 1 mg via INTRAVENOUS
  Filled 2011-12-17 (×9): qty 1

## 2011-12-17 MED ORDER — ACETAMINOPHEN 325 MG PO TABS: 650.0000 mg | ORAL_TABLET | Freq: Four times a day (QID) | ORAL | Status: DC | PRN

## 2011-12-17 MED ORDER — DIPHENHYDRAMINE HCL 12.5 MG/5ML PO ELIX
12.5000 mg | ORAL_SOLUTION | ORAL | Status: DC | PRN
  Administered 2011-12-17 – 2011-12-20 (×11): 25 mg via ORAL
  Filled 2011-12-17: qty 10
  Filled 2011-12-17 (×2): qty 5
  Filled 2011-12-17: qty 10
  Filled 2011-12-17: qty 5
  Filled 2011-12-17 (×2): qty 10
  Filled 2011-12-17: qty 5
  Filled 2011-12-17: qty 10
  Filled 2011-12-17 (×2): qty 5

## 2011-12-17 MED ORDER — SENNA 8.6 MG PO TABS
1.0000 | ORAL_TABLET | Freq: Two times a day (BID) | ORAL | Status: DC
  Administered 2011-12-17 – 2011-12-20 (×7): 8.6 mg via ORAL
  Filled 2011-12-17 (×9): qty 1

## 2011-12-17 MED ORDER — ASPIRIN 81 MG PO TABS: 81.0000 mg | ORAL_TABLET | Freq: Every day | ORAL | Status: DC

## 2011-12-17 MED ORDER — DOCUSATE SODIUM 100 MG PO CAPS
100.0000 mg | ORAL_CAPSULE | Freq: Two times a day (BID) | ORAL | Status: DC
  Administered 2011-12-17 – 2011-12-20 (×7): 100 mg via ORAL
  Filled 2011-12-17 (×7): qty 1

## 2011-12-17 MED ORDER — CEFAZOLIN SODIUM 1-5 GM-% IV SOLN
1.0000 g | Freq: Four times a day (QID) | INTRAVENOUS | Status: AC
  Administered 2011-12-17 (×2): 1 g via INTRAVENOUS
  Filled 2011-12-17 (×5): qty 50

## 2011-12-17 MED ORDER — ALUM & MAG HYDROXIDE-SIMETH 200-200-20 MG/5ML PO SUSP
30.0000 mL | ORAL | Status: DC | PRN
  Administered 2011-12-17 – 2011-12-19 (×3): 30 mL via ORAL
  Filled 2011-12-17 (×4): qty 30

## 2011-12-17 MED ORDER — METHOCARBAMOL 500 MG PO TABS: 500.0000 mg | ORAL_TABLET | Freq: Four times a day (QID) | ORAL | Status: AC

## 2011-12-17 MED ORDER — METHOCARBAMOL 100 MG/ML IJ SOLN
500.0000 mg | Freq: Four times a day (QID) | INTRAVENOUS | Status: DC | PRN
  Filled 2011-12-17: qty 5

## 2011-12-17 MED ORDER — HYDROCODONE-ACETAMINOPHEN 10-325 MG PO TABS
1.0000 | ORAL_TABLET | ORAL | Status: DC | PRN
  Administered 2011-12-17 – 2011-12-20 (×6): 2 via ORAL
  Filled 2011-12-17 (×6): qty 2

## 2011-12-17 MED ORDER — ASPIRIN EC 81 MG PO TBEC
81.0000 mg | DELAYED_RELEASE_TABLET | Freq: Every day | ORAL | Status: DC
  Administered 2011-12-17 – 2011-12-20 (×4): 81 mg via ORAL
  Filled 2011-12-17 (×4): qty 1

## 2011-12-17 MED ORDER — ENOXAPARIN SODIUM 40 MG/0.4ML ~~LOC~~ SOLN: 40.0000 mg | SUBCUTANEOUS | Status: DC

## 2011-12-17 MED ORDER — HYDROMORPHONE HCL PF 1 MG/ML IJ SOLN
INTRAMUSCULAR | Status: AC
  Filled 2011-12-17: qty 1

## 2011-12-17 MED ORDER — METOCLOPRAMIDE HCL 5 MG/ML IJ SOLN
5.0000 mg | Freq: Three times a day (TID) | INTRAMUSCULAR | Status: DC | PRN
  Administered 2011-12-18: 10 mg via INTRAVENOUS
  Filled 2011-12-17: qty 2

## 2011-12-17 MED ORDER — BUPIVACAINE HCL (PF) 0.25 % IJ SOLN
INTRAMUSCULAR | Status: DC | PRN
  Administered 2011-12-17: 10 mL

## 2011-12-17 MED ORDER — ONDANSETRON HCL 4 MG PO TABS: 4.0000 mg | ORAL_TABLET | Freq: Four times a day (QID) | ORAL | Status: DC | PRN

## 2011-12-17 MED ORDER — PROPOFOL 10 MG/ML IV EMUL
INTRAVENOUS | Status: DC | PRN
  Administered 2011-12-17: 200 mg via INTRAVENOUS

## 2011-12-17 MED ORDER — WARFARIN VIDEO: Freq: Once | Status: DC

## 2011-12-17 MED ORDER — SODIUM CHLORIDE 0.9 % IR SOLN
Status: DC | PRN
  Administered 2011-12-17: 1

## 2011-12-17 MED ORDER — HYDROCODONE-ACETAMINOPHEN 10-325 MG PO TABS: 1.0000 | ORAL_TABLET | Freq: Four times a day (QID) | ORAL | Status: AC | PRN

## 2011-12-17 MED ORDER — METOCLOPRAMIDE HCL 10 MG PO TABS: 5.0000 mg | ORAL_TABLET | Freq: Three times a day (TID) | ORAL | Status: DC | PRN

## 2011-12-17 MED ORDER — ADULT MULTIVITAMIN W/MINERALS CH
1.0000 | ORAL_TABLET | Freq: Every day | ORAL | Status: DC
  Administered 2011-12-17 – 2011-12-20 (×4): 1 via ORAL
  Filled 2011-12-17 (×4): qty 1

## 2011-12-17 MED ORDER — FLUTICASONE PROPIONATE 50 MCG/ACT NA SUSP
2.0000 | Freq: Every day | NASAL | Status: DC
  Administered 2011-12-17 – 2011-12-20 (×3): 2 via NASAL
  Filled 2011-12-17: qty 16

## 2011-12-17 MED ORDER — COUMADIN BOOK
Freq: Once | Status: AC
  Administered 2011-12-17: 14:00:00
  Filled 2011-12-17: qty 1

## 2011-12-17 MED ORDER — ONDANSETRON HCL 4 MG/2ML IJ SOLN: 4.0000 mg | Freq: Once | INTRAMUSCULAR | Status: DC | PRN

## 2011-12-17 MED ORDER — PNEUMOCOCCAL VAC POLYVALENT 25 MCG/0.5ML IJ INJ
0.5000 mL | INJECTION | INTRAMUSCULAR | Status: AC
  Filled 2011-12-17: qty 0.5

## 2011-12-17 MED ORDER — FENTANYL CITRATE 0.05 MG/ML IJ SOLN
INTRAMUSCULAR | Status: DC | PRN
  Administered 2011-12-17 (×2): 50 ug via INTRAVENOUS
  Administered 2011-12-17: 100 ug via INTRAVENOUS
  Administered 2011-12-17: 50 ug via INTRAVENOUS

## 2011-12-17 MED ORDER — ONDANSETRON HCL 4 MG/2ML IJ SOLN
INTRAMUSCULAR | Status: DC | PRN
  Administered 2011-12-17: 4 mg via INTRAVENOUS

## 2011-12-17 MED ORDER — METHOCARBAMOL 500 MG PO TABS
500.0000 mg | ORAL_TABLET | Freq: Four times a day (QID) | ORAL | Status: DC | PRN
  Administered 2011-12-19 – 2011-12-20 (×2): 500 mg via ORAL
  Filled 2011-12-17 (×2): qty 1

## 2011-12-17 MED ORDER — ONDANSETRON HCL 4 MG/2ML IJ SOLN
4.0000 mg | Freq: Four times a day (QID) | INTRAMUSCULAR | Status: DC | PRN
  Administered 2011-12-18: 4 mg via INTRAVENOUS
  Filled 2011-12-17: qty 2

## 2011-12-17 MED ORDER — ACETAMINOPHEN 650 MG RE SUPP: 650.0000 mg | Freq: Four times a day (QID) | RECTAL | Status: DC | PRN

## 2011-12-17 MED ORDER — POLYETHYLENE GLYCOL 3350 17 G PO PACK: 17.0000 g | PACK | Freq: Every day | ORAL | Status: DC | PRN

## 2011-12-17 MED ORDER — METHOCARBAMOL 100 MG/ML IJ SOLN
500.0000 mg | INTRAVENOUS | Status: AC
  Administered 2011-12-17: 500 mg via INTRAVENOUS
  Filled 2011-12-17: qty 5

## 2011-12-17 MED ORDER — PROMETHAZINE HCL 25 MG PO TABS: 25.0000 mg | ORAL_TABLET | Freq: Four times a day (QID) | ORAL | Status: DC | PRN

## 2011-12-17 MED ORDER — HYDROMORPHONE HCL PF 1 MG/ML IJ SOLN
0.2500 mg | INTRAMUSCULAR | Status: DC | PRN
  Administered 2011-12-17 (×4): 0.5 mg via INTRAVENOUS

## 2011-12-17 MED ORDER — WARFARIN SODIUM 6 MG PO TABS
6.0000 mg | ORAL_TABLET | Freq: Once | ORAL | Status: AC
  Administered 2011-12-17: 6 mg via ORAL
  Filled 2011-12-17: qty 1

## 2011-12-17 MED ORDER — WARFARIN - PHARMACIST DOSING INPATIENT
Freq: Every day | Status: DC
  Filled 2011-12-17 (×4): qty 1

## 2011-12-17 MED ORDER — LACTATED RINGERS IV SOLN
INTRAVENOUS | Status: DC | PRN
  Administered 2011-12-17 (×2): via INTRAVENOUS

## 2011-12-17 MED ORDER — MIDAZOLAM HCL 5 MG/5ML IJ SOLN
INTRAMUSCULAR | Status: DC | PRN
  Administered 2011-12-17: 2 mg via INTRAVENOUS

## 2011-12-17 SURGICAL SUPPLY — 65 items
BENZOIN TINCTURE PRP APPL 2/3 (GAUZE/BANDAGES/DRESSINGS) ×2 IMPLANT
BLADE SAW SAG 73X25 THK (BLADE) ×1
BLADE SAW SGTL 73X25 THK (BLADE) ×1 IMPLANT
BRUSH FEMORAL CANAL (MISCELLANEOUS) IMPLANT
CLOTH BEACON ORANGE TIMEOUT ST (SAFETY) ×2 IMPLANT
COVER BACK TABLE 24X17X13 BIG (DRAPES) IMPLANT
COVER SURGICAL LIGHT HANDLE (MISCELLANEOUS) ×2 IMPLANT
DRAPE INCISE IOBAN 66X45 STRL (DRAPES) IMPLANT
DRAPE ORTHO SPLIT 77X108 STRL (DRAPES) ×2
DRAPE PROXIMA HALF (DRAPES) ×2 IMPLANT
DRAPE SURG ORHT 6 SPLT 77X108 (DRAPES) ×2 IMPLANT
DRAPE U-SHAPE 47X51 STRL (DRAPES) ×2 IMPLANT
DRILL BIT 5/64 (BIT) ×2 IMPLANT
DRSG MEPILEX BORDER 4X12 (GAUZE/BANDAGES/DRESSINGS) IMPLANT
DRSG MEPILEX BORDER 4X8 (GAUZE/BANDAGES/DRESSINGS) ×2 IMPLANT
DRSG PAD ABDOMINAL 8X10 ST (GAUZE/BANDAGES/DRESSINGS) IMPLANT
DURAPREP 26ML APPLICATOR (WOUND CARE) ×2 IMPLANT
ELECT CAUTERY BLADE 6.4 (BLADE) ×2 IMPLANT
ELECT REM PT RETURN 9FT ADLT (ELECTROSURGICAL) ×2
ELECTRODE REM PT RTRN 9FT ADLT (ELECTROSURGICAL) ×1 IMPLANT
EVACUATOR 1/8 PVC DRAIN (DRAIN) IMPLANT
GLOVE BIOGEL PI IND STRL 7.0 (GLOVE) ×1 IMPLANT
GLOVE BIOGEL PI IND STRL 8 (GLOVE) ×3 IMPLANT
GLOVE BIOGEL PI INDICATOR 7.0 (GLOVE) ×1
GLOVE BIOGEL PI INDICATOR 8 (GLOVE) ×3
GLOVE ORTHO TXT STRL SZ7.5 (GLOVE) ×2 IMPLANT
GLOVE SURG ORTHO 8.0 STRL STRW (GLOVE) ×4 IMPLANT
GLOVE SURG SS PI 6.5 STRL IVOR (GLOVE) ×2 IMPLANT
GLOVE SURG SS PI 7.5 STRL IVOR (GLOVE) ×2 IMPLANT
GOWN PREVENTION PLUS XLARGE (GOWN DISPOSABLE) ×2 IMPLANT
GOWN PREVENTION PLUS XXLARGE (GOWN DISPOSABLE) ×2 IMPLANT
GOWN STRL NON-REIN LRG LVL3 (GOWN DISPOSABLE) ×6 IMPLANT
HANDPIECE INTERPULSE COAX TIP (DISPOSABLE)
HOOD PEEL AWAY FACE SHEILD DIS (HOOD) ×4 IMPLANT
KIT BASIN OR (CUSTOM PROCEDURE TRAY) ×2 IMPLANT
KIT ROOM TURNOVER OR (KITS) ×2 IMPLANT
MANIFOLD NEPTUNE II (INSTRUMENTS) ×2 IMPLANT
NEEDLE HYPO 25GX1X1/2 BEV (NEEDLE) ×2 IMPLANT
NS IRRIG 1000ML POUR BTL (IV SOLUTION) ×2 IMPLANT
PACK TOTAL JOINT (CUSTOM PROCEDURE TRAY) ×2 IMPLANT
PAD ARMBOARD 7.5X6 YLW CONV (MISCELLANEOUS) ×4 IMPLANT
PILLOW ABDUCTION HIP (SOFTGOODS) ×2 IMPLANT
PRESSURIZER FEMORAL UNIV (MISCELLANEOUS) IMPLANT
RETRIEVER SUT HEWSON (MISCELLANEOUS) ×2 IMPLANT
SET HNDPC FAN SPRY TIP SCT (DISPOSABLE) IMPLANT
SPONGE GAUZE 4X4 12PLY (GAUZE/BANDAGES/DRESSINGS) IMPLANT
SPONGE LAP 4X18 X RAY DECT (DISPOSABLE) IMPLANT
STRIP CLOSURE SKIN 1/2X4 (GAUZE/BANDAGES/DRESSINGS) ×2 IMPLANT
SUCTION FRAZIER TIP 10 FR DISP (SUCTIONS) ×2 IMPLANT
SUT FIBERWIRE #2 38 REV NDL BL (SUTURE) ×6
SUT FIBERWIRE #2 38 T-5 BLUE (SUTURE)
SUT MNCRL AB 4-0 PS2 18 (SUTURE) IMPLANT
SUT VIC AB 0 CT1 27 (SUTURE) ×2
SUT VIC AB 0 CT1 27XBRD ANBCTR (SUTURE) ×2 IMPLANT
SUT VIC AB 2-0 CT1 27 (SUTURE) ×2
SUT VIC AB 2-0 CT1 TAPERPNT 27 (SUTURE) ×2 IMPLANT
SUT VIC AB 3-0 SH 18 (SUTURE) ×2 IMPLANT
SUTURE FIBERWR #2 38 T-5 BLUE (SUTURE) IMPLANT
SUTURE FIBERWR#2 38 REV NDL BL (SUTURE) ×3 IMPLANT
SYR CONTROL 10ML LL (SYRINGE) ×2 IMPLANT
TOWEL OR 17X24 6PK STRL BLUE (TOWEL DISPOSABLE) ×2 IMPLANT
TOWEL OR 17X26 10 PK STRL BLUE (TOWEL DISPOSABLE) ×2 IMPLANT
TOWER CARTRIDGE SMART MIX (DISPOSABLE) IMPLANT
TRAY FOLEY CATH 14FR (SET/KITS/TRAYS/PACK) ×2 IMPLANT
WATER STERILE IRR 1000ML POUR (IV SOLUTION) ×4 IMPLANT

## 2011-12-17 NOTE — Discharge Instructions (Signed)
Total Hip Replacement   In total hip replacement, the damaged hip is replaced with an artificial hip joint (prosthesis). The purpose of this surgery is to reduce pain and improve your range of motion. It is one of the most successful joint replacement surgeries.   LET YOUR CAREGIVER KNOW ABOUT:   Allergies.   Medicines taken, including herbs, eyedrops, over-the-counter medicines, and creams.   Use of steroids (by mouth or creams).   Previous problems with anesthetics.   Family history of anesthetic problems.   Possibility of pregnancy, if this applies.   History of blood clots (thrombophlebitis).   History of bleeding or blood problems.   Previous surgery.   Other health problems.   BEFORE THE PROCEDURE   Do not eat or drink anything for as long as directed by your caregiver before surgery.   You should be present 60 minutes before your procedure or as directed by your caregiver.   PROCEDURE   An intravenous (IV) line for giving fluids will be started. You will be given medicines and gas to make you sleep (anesthetic), or you will be given medicines through a needle in your back to make you numb from the waist down. Your surgeon will take out any damaged cartilage and bone. He or she will then put in new metal, plastic, or ceramic joint surfaces to restore alignment and function to your hip.   AFTER THE PROCEDURE   After the procedure, you will be taken to the recovery area where a nurse will watch and check your progress. You may have a long, narrow tube (catheter) in your bladder after surgery. The catheter helps you empty your bladder (pass your urine). Once you are awake, stable, and taking fluids well, you will be returned to your room. You will receive physical therapy until you are doing well and your caregiver feels it is safe for you to go home. If you do not have help at home, you may need to go to an extended care facility for 5 to 14 days after the procedure.   Document Released: 01/06/2001 Document  Revised: 09/19/2011 Document Reviewed: 08/02/2009   [employer] Patient Information 2012 [employer]

## 2011-12-17 NOTE — Progress Notes (Signed)
Report given to maryann rn as caregiver 

## 2011-12-17 NOTE — H&P (Signed)
PREOPERATIVE H&P  Chief Complaint: DJD LEFT HIP  HPI: Mark Dunlap is a 51 y.o. male who presents for preoperative history and physical with a diagnosis of DJD LEFT HIP. Symptoms are rated as moderate to severe, and have been worsening.  This is significantly impairing activities of daily living.  He has elected for surgical management.   Past Medical History  Diagnosis Date  . THYROID NODULE, RIGHT 03/02/2010  . Adjustment disorder with depressed mood 09/2007  . 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 HYPOTENSION 10/31/2009  . Acute prostatitis 05/23/2009  . SPINAL STENOSIS, CERVICAL 08/08/2009  . H/O: substance abuse     hx of ETOH/Crack cocaine-none since 11/08 per pt/Does not Drive due to this  . DJD (degenerative joint disease) of knee     left knee  . DJD (degenerative joint disease) of hip   . HYPERTENSION 08/30/2008  . HYPERTHYROIDISM 02/13/2010    pt was told by  Dr Jonny Ruiz  that thyroid was now back to normal .. 2012 ...   . CERVICAL RADICULOPATHY, LEFT 08/30/2008   Past Surgical History  Procedure Date  . Right hand i&d 12/07    s/p rdue to abscess-Dr. Amanda Pea  . Anterior cervical decomp/discectomy fusion 10/11/2011    Procedure: ANTERIOR CERVICAL DECOMPRESSION/DISCECTOMY FUSION 3 LEVELS;  Surgeon: Tia Alert;  Location: [facility];  Service: Neurosurgery;  Laterality: N/A;  Cervical three-four ,cervical four five cervical five six Anterior Cervical Decompression Fusion with peek + plate Nuvasive translational plate Orthofix peek (2 1/2 hours) Rm # 32   History   Social History  . Marital Status: Single    Spouse Name: N/A    Number of Children: 0  . Years of Education: N/A   Occupational History  . 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 Smoking status: Current Everyday Smoker -- 1.0 packs/day for 38 years  . Smokeless tobacco: Not on file  . Alcohol Use: Yes     hx of alcohol abuse    . Drug Use: Yes    Special: "Crack" cocaine     history of, " clean for 13 months"  . Sexually Active: Not on file   Other Topics Concern  . Not on file   Social History Narrative  . No narrative on file   Family History  Problem Relation Age of Onset  . Arthritis Mother   . Hyperlipidemia Mother   . Goiter Mother     resection of benign goiter  . Alcohol abuse Father   . Stroke Father   . Heart disease Father   . Hypertension Sister   . Diabetes Sister   . Goiter Sister     resection of benign goiter  . Diabetes Brother   . Alcohol abuse Brother   . Alcohol abuse Cousin   . Heart disease Other     Aunt   Allergies  Allergen Reactions  . Morphine And Related Itching  . Tramadol Itching   Prior to Admission medications   Medication Sig Start Date End Date Taking? Authorizing Provider  acetaminophen (TYLENOL) 500 MG tablet Take 500 mg by mouth every 6 (six) hours as needed.   Yes Historical Provider, MD  aspirin 81 MG tablet Take 81 mg by mouth daily.     Yes Historical Provider, MD  bimatoprost (LUMIGAN) 0.01 % SOLN Place 1 drop into both eyes at bedtime.   Yes  Historical Provider, MD  fluticasone (FLONASE) 50 MCG/ACT nasal spray Place 2 sprays into the nose daily. 05/02/11  Yes Oliver Barre, MD  lisinopril-hydrochlorothiazide (PRINZIDE,ZESTORETIC) 20-25 MG per tablet Take 1 tablet by mouth daily.     Yes Historical Provider, MD  methocarbamol (ROBAXIN) 500 MG tablet Take 500 mg by mouth 4 (four) times daily.   Yes Historical Provider, MD  Multiple Vitamin (MULITIVITAMIN WITH MINERALS) TABS Take 1 tablet by mouth daily.   Yes Historical Provider, MD  oxyCODONE-acetaminophen (PERCOCET) 5-325 MG per tablet Take 1 tablet by mouth every 8 (eight) hours as needed. For pain   Yes Historical Provider, MD  promethazine (PHENERGAN) 25 MG tablet Take 25 mg by mouth every 6 (six) hours as needed. For nausea    Yes Historical Provider, MD  traZODone (DESYREL) 150 MG tablet Take 150 mg  by mouth at bedtime.   05/02/11  Yes Oliver Barre, MD  HYDROcodone-acetaminophen (LORTAB) 7.5-500 MG per tablet Take 1 tablet by mouth every 6 (six) hours as needed.    Historical Provider, MD     Positive ROS: All other systems have been reviewed and were otherwise negative with the exception of those mentioned in the HPI and as above.  Physical Exam: General: Alert, no acute distress Cardiovascular: No pedal edema Respiratory: No cyanosis, no use of accessory musculature GI: No organomegaly, abdomen is soft and non-tender Skin: No lesions in the area of chief complaint Neurologic: Sensation intact distally Psychiatric: Patient is competent for consent with normal mood and affect Lymphatic: No axillary or cervical lymphadenopathy  MUSCULOSKELETAL: Left hip has severe pain with passive motion, 0-80 at most. He has no internal or external rotation.  Assessment: DJD LEFT HIP  Plan: Plan for Procedure(s): TOTAL HIP ARTHROPLASTY  The risks benefits and alternatives were discussed with the patient including but not limited to the risks of nonoperative treatment, versus surgical intervention including infection, bleeding, nerve injury, periprosthetic fracture, the need for revision surgery, dislocation, leg length discrepancy, blood clots, cardiopulmonary complications, morbidity, mortality, among others, and they were willing to proceed.  Predicted outcome is good, although there will be at least a six to nine month expected recovery.    Eulas Post, MD 12/17/2011 7:30 AM

## 2011-12-17 NOTE — Progress Notes (Signed)
12/17/2011 1940  Pt with c/o of not being able to fully empty bladder.  Foley catheter removed at 1240 today. Per day shift report pt was bladder scanned at approximately 1630 with a result of >532ml. RN continued to monitor. Pt attempted to stand with my assistance and was only able to void approximately 500cc urine. Pt was I&O cathed with a result of clear, yellow urine. Pt reports relief. Will continue to monitor.  Hively, Avie Echevaria, RN

## 2011-12-17 NOTE — Plan of Care (Signed)
Problem: Consults Goal: Diagnosis- Total Joint Replacement Primary Total Hip     

## 2011-12-17 NOTE — Progress Notes (Signed)
12/17/2011 2145 Pt continues to have urgency/frequency with urination. Voiding approximately 77ml-100ml each time. Post void residual bladder scan done with showing in bladder after voiding. Pt reports relief since I&O cath done earlier. Will continue to monitor. Hively, Avie Echevaria, RN

## 2011-12-17 NOTE — Progress Notes (Signed)
ANTICOAGULATION CONSULT NOTE - Initial Consult  Pharmacy Consult for Coumadin Indication: VTE prophylaxis  Allergies  Allergen Reactions  . Morphine And Related Itching  . Tramadol Itching    Patient Measurements: Height: 6' 0.5" (184.2 cm) Weight: 146 lb 9.7 oz (66.5 kg) (from preadmit record) IBW/kg (Calculated) : 78.75  Heparin Dosing Weight: 66.5 kg  Vital Signs: Temp: 97 F (36.1 C) (03/05 1112) Temp src: Oral (03/05 0646) BP: 135/121 mmHg (03/05 1104) Pulse Rate: 79  (03/05 1112)  Labs: No results found for this basename: HGB:2,HCT:3,PLT:3,APTT:3,LABPROT:3,INR:3,HEPARINUNFRC:3,CREATININE:3,CKTOTAL:3,CKMB:3,TROPONINI:3 in the last 72 hours Estimated Creatinine Clearance: 87.4 ml/min (by C-G formula based on Cr of 0.94).  Medical History: Past Medical History  Diagnosis Date  . THYROID NODULE, RIGHT 03/02/2010  . Adjustment disorder with depressed mood 09/2007  . 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 HYPOTENSION 10/31/2009  . Acute prostatitis 05/23/2009  . SPINAL STENOSIS, CERVICAL 08/08/2009  . H/O: substance abuse     hx of ETOH/Crack cocaine-none since 11/08 per pt/Does not Drive due to this  . DJD (degenerative joint disease) of knee     left knee  . DJD (degenerative joint disease) of hip   . HYPERTENSION 08/30/2008  . HYPERTHYROIDISM 02/13/2010    pt was told by  Dr Jonny Ruiz  that thyroid was now back to normal .. 2012 ...   . CERVICAL RADICULOPATHY, LEFT 08/30/2008    Medications:  Prescriptions prior to admission  Medication Sig Dispense Refill  . aspirin 81 MG tablet Take 81 mg by mouth daily.        . bimatoprost (LUMIGAN) 0.01 % SOLN Place 1 drop into both eyes at bedtime.      . fluticasone (FLONASE) 50 MCG/ACT nasal spray Place 2 sprays into the nose daily.  16 g  5  . lisinopril-hydrochlorothiazide (PRINZIDE,ZESTORETIC) 20-25 MG per tablet Take 1 tablet by mouth daily.        . Multiple Vitamin  (MULITIVITAMIN WITH MINERALS) TABS Take 1 tablet by mouth daily.      . traZODone (DESYREL) 150 MG tablet Take 150 mg by mouth at bedtime.        [name] DISCONTD: acetaminophen (TYLENOL) 500 MG tablet Take 500 mg by mouth every 6 (six) hours as needed.      [name] DISCONTD: methocarbamol (ROBAXIN) 500 MG tablet Take 500 mg by mouth 4 (four) times daily.      [name] DISCONTD: oxyCODONE-acetaminophen (PERCOCET) 5-325 MG per tablet Take 1 tablet by mouth every 8 (eight) hours as needed. For pain      . DISCONTD: promethazine (PHENERGAN) 25 MG tablet Take 25 mg by mouth every 6 (six) hours as needed. For nausea       . DISCONTD: HYDROcodone-acetaminophen (LORTAB) 7.5-500 MG per tablet Take 1 tablet by mouth every 6 (six) hours as needed.        Assessment: L THA for OA  Anticoag: for VTE prophylaxis s/p L THA. Baseline INR 0.98 on 12/06/11. Baseline Hgb only 10.8.  Cards: HTN on lisinopril/HCTZ  Endo: h/o thyroid nodule and hyperthyroid. No current meds.  Neuro: DJD L knee, cervical spinal stenosis s/p back surgery 12/12.   Goal of Therapy:  INR 2-3   Plan:  Lovenox 40mg /day until INR>2. Coumadin 6mg  po x 1 today Initiate education with book/video Daily PT/INR  Mark Dunlap, [employer] 12/17/2011,1:13 PM

## 2011-12-17 NOTE — Progress Notes (Signed)
Xray done as ordered.

## 2011-12-17 NOTE — Op Note (Signed)
12/17/2011  9:23 AM  PATIENT:  Mark Dunlap   MRN: [id number]  PRE-OPERATIVE DIAGNOSIS:  DJD LEFT HIP  POST-OPERATIVE DIAGNOSIS:  DJD LEFT HIP  PROCEDURE:  Procedure(s): TOTAL HIP ARTHROPLASTY  PREOPERATIVE INDICATIONS:    Mark Dunlap is an 51 y.o. male who has a diagnosis of Osteoarthritis of left hip and elected for surgical management after failing conservative treatment.  The risks benefits and alternatives were discussed with the patient including but not limited to the risks of nonoperative treatment, versus surgical intervention including infection, bleeding, nerve injury, periprosthetic fracture, the need for revision surgery, dislocation, leg length discrepancy, blood clots, cardiopulmonary complications, morbidity, mortality, among others, and they were willing to proceed.  Predicted outcome is good, although there will be at least a six to nine month expected recovery.    OPERATIVE REPORT     SURGEON:  Teryl Lucy, MD    ASSISTANT:  Janace Litten, OPA-C  (Present throughout the entire procedure,  necessary for completion of procedure in a timely manner, assisting with retraction, instrumentation, and closure)     ANESTHESIA:  General    COMPLICATIONS:  None.     COMPONENTS:  [employer] fit femur size 5 high offset with a 36 mm head ball and a pinnacle acetabular shell size 54 with a 10 degree lipped polyethylene liner    PROCEDURE IN DETAIL:   The patient was met in the holding area and  identified.  The appropriate hip was identified and marked at the operative site.  The patient was then transported to the OR  and  placed under general l anesthesia.  At that point, the patient was  placed in the lateral decubitus position with the operative side up and  secured to the operating room table and all bony prominences padded.     The operative lower extremity was prepped from the iliac crest to the distal leg.  Sterile draping was performed.  Time out was  performed prior to incision.      A routine posterolateral approach was utilized via sharp dissection  carried down to the subcutaneous tissue.  Gross bleeders were Bovie coagulated.  The iliotibial band was identified and incised along the length of the skin incision.  Self-retaining retractors were  inserted.  With the hip internally rotated, the short external rotators  were identified. The piriformis and capsule was tagged with FiberWire, and the hip capsule released in a T-type fashion.  The femoral neck was exposed, and I resected the femoral neck using the appropriate jig. This was performed at approximately a thumb's breadth above the lesser trochanter.    I then exposed the deep acetabulum, cleared out any tissue including the ligamentum teres.  A wing retractor was placed.  After adequate visualization, I excised the labrum, and then sequentially reamed.  I placed the trial acetabulum, which seated nicely, and then impacted the real cup into place.  Appropriate version and inclination was confirmed clinically matching their bony anatomy, and also with the use of the jig.  A trial polyethylene liner was placed and the wing retractor removed.    I then prepared the proximal femur using the cookie-cutter, the lateralizing reamer, and then sequentially reamed and broached.  A trial broach, neck, and head was utilized, and I reduced the hip and it was found to have excellent stability with functional range of motion. The +5 high offset had the best stability with restoration of leg lengths. The trial components were then removed,  and the real polyethylene liner was placed with the lip directed posteriorly.  I then impacted the real femoral prosthesis into place into the appropriate version, slightly anteverted to the normal anatomy, and I impacted the real head ball into place. The hip was then reduced and taken through functional range of motion and found to have excellent stability. Leg lengths  were restored.  I then used a 2 mm drill bits to pass the FiberWire suture from the capsule and puriform is through the greater trochanter, and secured this. Excellent posterior capsular repair was achieved. I also closed the T in the capsule.  I then irrigated the hip copiously again with pulse lavage, and repaired the fascia with Vicryl, followed by Vicryl for the subcutaneous tissue, Monocryl for the skin, Steri-Strips and sterile gauze. The wounds were injected. The patient was then awakened and returned to PACU in stable and satisfactory condition. No complications.  Teryl Lucy, MD Orthopedic Surgeon [phone]   12/17/2011 9:23 AM

## 2011-12-17 NOTE — Preoperative (Signed)
Beta Blockers   Reason not to administer Beta Blockers:Not Applicable 

## 2011-12-17 NOTE — Anesthesia Preprocedure Evaluation (Signed)
Anesthesia Evaluation  Patient identified by MRN, date of birth, ID band Patient awake    Reviewed: Allergy & Precautions  Airway Mallampati: I TM Distance: >3 FB Neck ROM: full    Dental  (+) Teeth Intact and Poor Dentition   Pulmonary          Cardiovascular hypertension, Rhythm:regular Rate:Normal     Neuro/Psych PSYCHIATRIC DISORDERS  Neuromuscular disease    GI/Hepatic   Endo/Other  Hyperthyroidism   Renal/GU      Musculoskeletal   Abdominal   Peds  Hematology   Anesthesia Other Findings   Reproductive/Obstetrics                           Anesthesia Physical Anesthesia Plan  ASA: II  Anesthesia Plan: General ETT   Post-op Pain Management:    Induction: Intravenous  Airway Management Planned: Oral ETT  Additional Equipment:   Intra-op Plan:   Post-operative Plan: Extubation in OR  Informed Consent: I have reviewed the patients History and Physical, chart, labs and discussed the procedure including the risks, benefits and alternatives for the proposed anesthesia with the patient or authorized representative who has indicated his/her understanding and acceptance.     Plan Discussed with: Anesthesiologist, CRNA and Surgeon  Anesthesia Plan Comments:         Anesthesia Quick Evaluation

## 2011-12-17 NOTE — Transfer of Care (Signed)
Immediate Anesthesia Transfer of Care Note  Patient: Mark Dunlap  Procedure(s) Performed: Procedure(s) (LRB): TOTAL HIP ARTHROPLASTY (Left)  Patient Location: PACU  Anesthesia Type: General  Level of Consciousness: awake, alert , oriented and sedated  Airway & Oxygen Therapy: Patient Spontanous Breathing and Patient connected to nasal cannula oxygen  Post-op Assessment: Report given to PACU RN, Post -op Vital signs reviewed and stable and Patient moving all extremities  Post vital signs: Reviewed  Complications: No apparent anesthesia complications

## 2011-12-17 NOTE — Anesthesia Postprocedure Evaluation (Signed)
  Anesthesia Post-op Note  Patient: Mark Dunlap  Procedure(s) Performed: Procedure(s) (LRB): TOTAL HIP ARTHROPLASTY (Left)  Patient Location: PACU  Anesthesia Type: General  Level of Consciousness: awake, alert  and patient cooperative  Airway and Oxygen Therapy: Patient Spontanous Breathing and Patient connected to nasal cannula oxygen  Post-op Pain: mild  Post-op Assessment: Post-op Vital signs reviewed, Patient's Cardiovascular Status Stable, Respiratory Function Stable, Patent Airway, No signs of Nausea or vomiting and Pain level controlled  Post-op Vital Signs: stable  Complications: No apparent anesthesia complications

## 2011-12-18 ENCOUNTER — Encounter (HOSPITAL_COMMUNITY): Payer: Self-pay | Admitting: Orthopedic Surgery

## 2011-12-18 DIAGNOSIS — E871 Hypo-osmolality and hyponatremia: Secondary | ICD-10-CM

## 2011-12-18 DIAGNOSIS — D62 Acute posthemorrhagic anemia: Secondary | ICD-10-CM | POA: Diagnosis not present

## 2011-12-18 LAB — BASIC METABOLIC PANEL
BUN: 10 mg/dL (ref 6–23)
CO2: 28 mEq/L (ref 19–32)
Chloride: 94 mEq/L — ABNORMAL LOW (ref 96–112)
GFR calc Af Amer: 77 mL/min — ABNORMAL LOW (ref >90)
Potassium: 4.1 mEq/L (ref 3.5–5.1)

## 2011-12-18 LAB — CBC
HCT: 27.7 % — ABNORMAL LOW (ref 39.0–52.0)
RBC: 3.17 MIL/uL — ABNORMAL LOW (ref 4.22–5.81)
RDW: 14.3 % (ref 11.5–15.5)
WBC: 11.6 10*3/uL — ABNORMAL HIGH (ref 4.0–10.5)

## 2011-12-18 LAB — PROTIME-INR: INR: 1.15 (ref 0.00–1.49)

## 2011-12-18 MED ORDER — WARFARIN SODIUM 7.5 MG PO TABS
7.5000 mg | ORAL_TABLET | Freq: Once | ORAL | Status: AC
  Administered 2011-12-18: 7.5 mg via ORAL
  Filled 2011-12-18: qty 1

## 2011-12-18 NOTE — Progress Notes (Signed)
Clinical Social Work-CSW completed full assessment (in shadow chart). Pt would like to d/c to [facility] when stable-CSW initiated FL2 (also in shadow chart) and will f/u with bed offers. Jodean Lima, [phone]

## 2011-12-18 NOTE — Progress Notes (Signed)
CARE MANAGEMENT NOTE 12/18/2011   Date Initiated:  12/18/2011  Documentation initiated by:  Vance Peper  Subjective/Objective Assessment:   51 yr old male s/p left total hipi arthtroplasty.     Action/Plan:   Discharge planning. patient is for shortterm rehab at [facility].   Anticipated DC Date:  12/20/2011   Anticipated DC Plan:  SKILLED NURSING FACILITY  In-house referral  Clinical Social Worker      DC Planning Services  CM consult      [facility] Choice  NA   Choice offered to / List presented to:  NA   DME arranged  NA      DME agency  NA     HH arranged  NA      HH agency  NA   Status of service:  Completed, signed off   Discharge Disposition:  SKILLED NURSING FACILITY

## 2011-12-18 NOTE — Progress Notes (Signed)
Physical Therapy Evaluation  Past Medical History  Diagnosis Date  . THYROID NODULE, RIGHT 03/02/2010  . Adjustment disorder with depressed mood 09/2007  . 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 HYPOTENSION 10/31/2009  . Acute prostatitis 05/23/2009  . SPINAL STENOSIS, CERVICAL 08/08/2009  . H/O: substance abuse     hx of ETOH/Crack cocaine-none since 11/08 per pt/Does not Drive due to this  . DJD (degenerative joint disease) of knee     left knee  . DJD (degenerative joint disease) of hip   . HYPERTENSION 08/30/2008  . HYPERTHYROIDISM 02/13/2010    pt was told by  Dr Jonny Ruiz  that thyroid was now back to normal .. 2012 ...   . CERVICAL RADICULOPATHY, LEFT 08/30/2008   Past Surgical History  Procedure Date  . Right hand i&d 12/07    s/p rdue to abscess-Dr. Amanda Pea  . Anterior cervical decomp/discectomy fusion 10/11/2011    Procedure: ANTERIOR CERVICAL DECOMPRESSION/DISCECTOMY FUSION 3 LEVELS;  Surgeon: Tia Alert;  Location: [facility];  Service: Neurosurgery;  Laterality: N/A;  Cervical three-four ,cervical four five cervical five six Anterior Cervical Decompression Fusion with peek + plate Nuvasive translational plate Orthofix peek (2 1/2 hours) Rm # [id number]  . Total hip arthroplasty 12/17/2011    Procedure: TOTAL HIP ARTHROPLASTY;  Surgeon: Eulas Post, MD;  Location: [facility] OR;  Service: Orthopedics;  Laterality: Left;    12/18/11 0800  PT Visit Information  Last PT Received On 12/18/11  Patient Stated Goals  Goal #1 Walk  Precautions  Precautions Posterior Hip  Precaution Booklet Issued Yes (comment)  Restrictions  Weight Bearing Restrictions Yes  Home Living  Lives With (Mother)  Receives Help From (Mother)  Type of Home House  Additional Comments pt easily distracted, but notes plan is for SNF at D/C  Prior Function  Level of Independence Independent with basic ADLs;Independent with homemaking with ambulation;Independent  with gait;Independent with transfers;Requires assistive device for independence  Vocation Requirements Works at [employer] for the [employer] Level Oriented X4  Bed Mobility  Bed Mobility No  Transfers  Transfers Yes  Sit to Stand 3: Mod assist;With upper extremity assist;From bed  Sit to Stand Details (indicate cue type and reason) cues for UE use, LE positioning, anterior wt shift  Stand to Sit 4: Min assist;With upper extremity assist;With armrests;To chair/3-in-1  Stand to Sit Details cues for UE use, control descent, LE positioning  Ambulation/Gait  Ambulation/Gait Yes  Ambulation/Gait Assistance 4: Min assist  Ambulation/Gait Assistance Details (indicate cue type and reason) cues for RW use, sequencing, WBing, Hip Precautions  Ambulation Distance (Feet) 15 Feet  Assistive device Rolling walker  Gait Pattern Step-to pattern;Decreased stride length;Shuffle;Trunk flexed  Stairs No  Wheelchair Mobility  Wheelchair Mobility No  Posture/Postural Control  Posture/Postural Control No significant limitations  Balance  Balance Assessed No  RLE Assessment  RLE Assessment WFL  LLE Assessment  LLE Assessment X  LLE Strength  LLE Overall Strength Comments Strength grossly <3/5 secondary to pain  Exercises  Exercises Total Joint  Total Joint Exercises  Ankle Circles/Pumps AROM;Both;10 reps  Quad Sets AROM;Both;10 reps  PT - End of Session  Equipment Utilized During Treatment Gait belt  Activity Tolerance Patient limited by pain;Patient limited by fatigue  Patient left in chair;with call bell in reach  Nurse Communication Mobility status for transfers;Mobility status for ambulation  General  Behavior During Session [facility] for tasks  performed  Cognition [facility] for tasks performed  PT Assessment  Clinical Impression Statement pt presents s/p THA.  pt painful and fatigues quickly.  pt needs frequent cues to re-direct pt as he is easily off topic.  pt concerned about if  he D/C'd to home with his 73 y/o mother vs D/C to SNF for pt and his mother's safety.    PT Recommendation/Assessment Patient will need skilled PT in the acute care venue  PT Problem List Decreased strength;Decreased activity tolerance;Decreased balance;Decreased mobility;Decreased knowledge of use of DME;Decreased knowledge of precautions;Pain  Barriers to Discharge None  PT Therapy Diagnosis  Abnormality of gait;Acute pain  PT Plan  PT Frequency 7X/week  PT Treatment/Interventions DME instruction;Gait training;Stair training;Functional mobility training;Therapeutic activities;Therapeutic exercise;Balance training;Patient/family education  PT Recommendation  Follow Up Recommendations Skilled nursing facility  Equipment Recommended Defer to next venue  Individuals Consulted  Consulted and Agree with Results and Recommendations Patient  Acute Rehab PT Goals  PT Goal Formulation With patient  Time For Goal Achievement 2 weeks  Pt will go Supine/Side to Sit with modified independence  PT Goal: Supine/Side to Sit - Progress Goal set today  Pt will go Sit to Stand with modified independence  PT Goal: Sit to Stand - Progress Goal set today  Pt will Ambulate >150 feet;with modified independence;with rolling walker  PT Goal: Ambulate - Progress Goal set today  Pt will Go Up / Down Stairs 3-5 stairs;with min assist;with least restrictive assistive device  PT Goal: Up/Down Stairs - Progress Goal set today  Pt will Perform Home Exercise Program Independently  PT Goal: Perform Home Exercise Program - Progress Goal set today    Mack Hook, PT [phone]

## 2011-12-18 NOTE — Progress Notes (Addendum)
ANTICOAGULATION CONSULT NOTE - Follow Up Consult  Pharmacy Consult for warfarin Indication: VTE pxl  Allergies  Allergen Reactions  . Morphine And Related Itching  . Tramadol Itching    Patient Measurements: Height: 6' 0.5" (184.2 cm) Weight: 146 lb 9.7 oz (66.5 kg) (from preadmit record) IBW/kg (Calculated) : 78.75  Heparin Dosing Weight:   Vital Signs: Temp: 98.7 F (37.1 C) (03/06 0636) BP: 91/52 mmHg (03/06 0636) Pulse Rate: 104  (03/06 0636)  Labs:  Basename 12/18/11 0557  HGB 8.9*  HCT 27.7*  PLT 377  APTT --  LABPROT 14.9  INR 1.15  HEPARINUNFRC --  CREATININE 1.23  CKTOTAL --  CKMB --  TROPONINI --   Estimated Creatinine Clearance: 66.8 ml/min (by C-G formula based on Cr of 1.23).   Medications:  Scheduled:    . aspirin EC  81 mg Oral Daily  . bimatoprost  1 drop Both Eyes QHS  .  ceFAZolin (ANCEF) IV  1 g Intravenous Q6H  . coumadin book   Does not apply Once  . docusate sodium  100 mg Oral BID  . enoxaparin  40 mg Subcutaneous Q24H  . fluticasone  2 spray Each Nare Daily  . hydrochlorothiazide  25 mg Oral Daily  . HYDROmorphone      . lisinopril  20 mg Oral Daily  . methocarbamol(ROBAXIN) IV  500 mg Intravenous To PACU  . mulitivitamin with minerals  1 tablet Oral Daily  . pneumococcal 23 valent vaccine  0.5 mL Intramuscular Tomorrow-1000  . senna  1 tablet Oral BID  . traZODone  150 mg Oral QHS  . warfarin  6 mg Oral ONCE-1800  . warfarin   Does not apply Once  . Warfarin - Pharmacist Dosing Inpatient   Does not apply q1800  . DISCONTD: aspirin  81 mg Oral Daily  . DISCONTD: lisinopril-hydrochlorothiazide  1 tablet Oral Daily    Assessment: 51 yo M on warfarin for VTE pxl s/p L THA. INR less than goal.  No complications noted.   Goal of Therapy:  INR 2-3   Plan:  Warfarin 7.5 mg po x1 Daily INR Continue lovenox until INR at goal   Yael Coppess L. Illene Bolus, PharmD, BCPS Clinical Pharmacist Pager: [phone] 12/18/2011 9:18 AM

## 2011-12-18 NOTE — Progress Notes (Signed)
UR COMPLETED  

## 2011-12-18 NOTE — Progress Notes (Signed)
1 Day Post-Op   Subjective:  Patient reports pain as moderate to severe. He is sitting up in bed eating breakfast and feels relatively well.  Objective:   VITALS:   Filed Vitals:   12/17/11 1112 12/17/11 1329 12/17/11 2110 12/18/11 0636  BP:  116/76 134/71 91/52  Pulse: 79 80 88 104  Temp: 97 F (36.1 C) 97.4 F (36.3 C) 98.8 F (37.1 C) 98.7 F (37.1 C)  TempSrc:  Oral Oral   Resp: 15 18 18 20   Height: 6' 0.5" (1.842 m)     Weight: 66.5 kg (146 lb 9.7 oz)     SpO2: 100% 98% 97% 95%    EHL and FHL are firing. His surgical wounds have dressings, which are clean.  LABS Lab Results  Component Value Date   HGB 8.9* 12/18/2011   HGB 10.8* 12/06/2011   HGB 12.1* 10/03/2011   CBC    Component Value Date/Time   WBC 11.6* 12/18/2011 0557   RBC 3.17* 12/18/2011 0557   HGB 8.9* 12/18/2011 0557   HCT 27.7* 12/18/2011 0557   PLT 377 12/18/2011 0557   MCV 87.4 12/18/2011 0557   MCH 28.1 12/18/2011 0557   MCHC 32.1 12/18/2011 0557   RDW 14.3 12/18/2011 0557   LYMPHSABS 1.5 04/25/2011 1624   MONOABS 0.8 04/25/2011 1624   EOSABS 0.4 04/25/2011 1624   BASOSABS 0.1 04/25/2011 1624    Lab Results  Component Value Date   INR 1.15 12/18/2011   Lab Results  Component Value Date   NA 129* 12/18/2011   K 4.1 12/18/2011   CL 94* 12/18/2011   CO2 28 12/18/2011   BUN 10 12/18/2011   CREATININE 1.23 12/18/2011   GLUCOSE 110* 12/18/2011   Dg Chest 2 View  12/06/2011  *RADIOLOGY REPORT*  Clinical Data: Preoperative evaluation.  Smoking history of one pack per day.  No known COPD are heart disease.  Hypertension.  No current chest complaints  CHEST - 2 VIEW  Comparison: 10/10/2006 and 10/03/2011  Findings: Overall decreased lung volume is noted associated with the right hemithorax.  Heart size and mediastinal contours are within normal limits.  A veiling density persists over the mid and lower portions of the right hemithorax with associated chronic scarring changes laterally in the lower lung zone and centrally  in the midlung zone. Persistent lateral pleural density is noted. This could be related to either chronic pleural thickening or chronic loculated fluid given the appearance on the lateral view. These findings are all stable in comparison with the most recent exam from 09/2011.  No new pulmonary findings are seen.  Bony structures appear intact.  IMPRESSION: Stable chronic changes at the right hemithorax as noted above.  No new focal or acute abnormalities identified.  Original Report Authenticated By: Bertha Stakes, M.D.   Dg Pelvis Portable  12/17/2011  *RADIOLOGY REPORT*  Clinical Data: Postop left hip replacement.  PORTABLE PELVIS  Comparison: None.  Findings: Changes of left hip replacement.  No hardware or bony complicating feature.  Soft tissue and joint space gas noted.  IMPRESSION: Left hip replacement.  No complicating feature.  Original Report Authenticated By: Cyndie Chime, M.D.   Dg Hip Portable 1 View Left  12/17/2011  *RADIOLOGY REPORT*  Clinical Data: Postop left hip replacement.  PORTABLE LEFT HIP - 1 VIEW  Comparison: Pelvic image earlier today.  Findings: Changes of left hip replacement.  Normal alignment.  No hardware or bony complicating feature.  IMPRESSION: Left  hip replacement without complicating feature.  Original Report Authenticated By: Cyndie Chime, M.D.    Assessment/Plan: Principal Problem:  *Osteoarthritis of left hip  Acute blood loss anemia, continue to observe. Hyponatremia, we will institute fluid restriction.  Into physical therapy for her recovery from his hip replacement. He is overall doing reasonably well. He is likely to need skilled nursing facility placement, based on lack of family support.   Jalene Lacko P 12/18/2011, 8:12 AM

## 2011-12-19 LAB — CBC
Hemoglobin: 9.8 g/dL — ABNORMAL LOW (ref 13.0–17.0)
Platelets: 444 10*3/uL — ABNORMAL HIGH (ref 150–400)
RBC: 3.44 MIL/uL — ABNORMAL LOW (ref 4.22–5.81)
WBC: 12.5 10*3/uL — ABNORMAL HIGH (ref 4.0–10.5)

## 2011-12-19 LAB — BASIC METABOLIC PANEL
CO2: 28 mEq/L (ref 19–32)
Chloride: 94 mEq/L — ABNORMAL LOW (ref 96–112)
Sodium: 131 mEq/L — ABNORMAL LOW (ref 135–145)

## 2011-12-19 LAB — PROTIME-INR: Prothrombin Time: 16.6 seconds — ABNORMAL HIGH (ref 11.6–15.2)

## 2011-12-19 MED ORDER — WARFARIN SODIUM 7.5 MG PO TABS
7.5000 mg | ORAL_TABLET | Freq: Once | ORAL | Status: AC
  Administered 2011-12-19: 7.5 mg via ORAL
  Filled 2011-12-19: qty 1

## 2011-12-19 NOTE — Progress Notes (Signed)
Physical Therapy Note   12/19/11 1100  PT Visit Information  Last PT Received On 12/19/11  Precautions  Precautions Posterior Hip  Precaution Comments Reviewed Hip Precautions.    Restrictions  Weight Bearing Restrictions Yes  LLE Weight Bearing WBAT  Bed Mobility  Bed Mobility Yes  Supine to Sit 4: Min assist  Supine to Sit Details (indicate cue type and reason) cues for hip precautions and A with L LE  Sitting - Scoot to Edge of Bed 5: Supervision  Sitting - Scoot to Edge of Bed Details (indicate cue type and reason) cues for hip precautions  Transfers  Transfers Yes  Sit to Stand 3: Mod assist;With upper extremity assist;From bed  Sit to Stand Details (indicate cue type and reason) cues for UE use, hip precautions  Stand to Sit 4: Min assist;With upper extremity assist;With armrests;To chair/3-in-1  Stand to Sit Details cues for getting closer to recliner, positioning LEs, use of UEs to control descent.    Ambulation/Gait  Ambulation/Gait Yes  Ambulation/Gait Assistance 4: Min assist  Ambulation/Gait Assistance Details (indicate cue type and reason) cues for sequencing, use of RW, upright posture, encouragement, hip precautions.  pt noted to easily pick up L LE and advance it to take step at times and other times pt yelling out and seeming as if he cannot move L LE at all.  Some inconsistency.    Ambulation Distance (Feet) 90 Feet  Assistive device Rolling walker  Gait Pattern Step-to pattern;Decreased stride length;Shuffle;Trunk flexed  Stairs No  Wheelchair Mobility  Wheelchair Mobility No  Posture/Postural Control  Posture/Postural Control No significant limitations  Balance  Balance Assessed No  PT - End of Session  Equipment Utilized During Treatment Gait belt  Activity Tolerance Patient limited by pain;Patient limited by fatigue  Patient left in chair;with call bell in reach  Nurse Communication Mobility status for transfers;Mobility status for ambulation  General    Behavior During Session [facility] for tasks performed  Cognition [facility] for tasks performed  PT - Assessment/Plan  Comments on Treatment Session pt presents s/p THA.  pt needs cues during all mobilty for hip precautions.  pt still easily distracted and needing cues to re-direct.  pt wanting to stay in bed this am, but strong education about improtance of being OOB.    PT Plan Discharge plan remains appropriate;Frequency remains appropriate  PT Frequency 7X/week  Follow Up Recommendations Skilled nursing facility  Equipment Recommended Defer to next venue  Acute Rehab PT Goals  PT Goal: Supine/Side to Sit - Progress Progressing toward goal  PT Goal: Sit to Stand - Progress Progressing toward goal  PT Goal: Ambulate - Progress Progressing toward goal  PT Goal: Perform Home Exercise Program - Progress Progressing toward goal    Mack Hook, PT [phone]

## 2011-12-19 NOTE — Progress Notes (Signed)
ANTICOAGULATION CONSULT NOTE - Follow Up Consult  Pharmacy Consult for warfarin Indication: VTE pxl  Allergies  Allergen Reactions  . Morphine And Related Itching  . Tramadol Itching    Patient Measurements: Height: 6' 0.5" (184.2 cm) Weight: 146 lb 9.7 oz (66.5 kg) (from preadmit record) IBW/kg (Calculated) : 78.75  Heparin Dosing Weight:   Vital Signs: Temp: 99.4 F (37.4 C) (03/07 0512) BP: 107/57 mmHg (03/07 0512) Pulse Rate: 100  (03/07 0512)  Labs:  Basename 12/19/11 0610 12/18/11 0557  HGB 9.8* 8.9*  HCT 30.6* 27.7*  PLT 444* 377  APTT -- --  LABPROT 16.6* 14.9  INR 1.32 1.15  HEPARINUNFRC -- --  CREATININE 1.07 1.23  CKTOTAL -- --  CKMB -- --  TROPONINI -- --   Estimated Creatinine Clearance: 76.8 ml/min (by C-G formula based on Cr of 1.07).   Medications:  Scheduled:     . aspirin EC  81 mg Oral Daily  . bimatoprost  1 drop Both Eyes QHS  .  ceFAZolin (ANCEF) IV  1 g Intravenous Q6H  . docusate sodium  100 mg Oral BID  . enoxaparin  40 mg Subcutaneous Q24H  . fluticasone  2 spray Each Nare Daily  . hydrochlorothiazide  25 mg Oral Daily  . lisinopril  20 mg Oral Daily  . mulitivitamin with minerals  1 tablet Oral Daily  . pneumococcal 23 valent vaccine  0.5 mL Intramuscular Tomorrow-1000  . senna  1 tablet Oral BID  . traZODone  150 mg Oral QHS  . warfarin  7.5 mg Oral ONCE-1800  . warfarin   Does not apply Once  . Warfarin - Pharmacist Dosing Inpatient   Does not apply q1800    Assessment: 51 yo M on warfarin for VTE pxl s/p L THA. INR less than goal.  No complications noted.   Goal of Therapy:  INR 2-3   Plan:  Warfarin 7.5 mg po x1 Daily INR Continue lovenox until INR at goal   Santasia Rew L. Illene Bolus, PharmD, BCPS Clinical Pharmacist Pager: [phone] 12/19/2011 9:37 AM

## 2011-12-19 NOTE — Progress Notes (Signed)
Clinical Social Worker spoke with Duwayne Heck at [facility] to inquire if facility would be able to offer a bed for a 3/8 dc.  Danielle to phone CSW back with answer.  CSW to continue to follow and assist as needed.  Angelia Mould, MSW, [place] [phone]

## 2011-12-19 NOTE — Progress Notes (Signed)
Subjective: 2 Days Post-Op Procedure(s) (LRB): TOTAL HIP ARTHROPLASTY (Left) Patient reports pain as mild.   He states that he is doing very well his biggest complaint is that his thigh is swollen  Objective: Vital signs in last 24 hours: Temp:  [98.3 F (36.8 C)-99.4 F (37.4 C)] 99.4 F (37.4 C) (03/07 0512) Pulse Rate:  [99-104] 100  (03/07 0512) Resp:  [18-20] 18  (03/07 0512) BP: (107-131)/(54-63) 107/57 mmHg (03/07 0512) SpO2:  [93 %-98 %] 98 % (03/07 0512)  Intake/Output from previous day: 03/06 0701 - 03/07 0700 In: 2477.3 [P.O.:240; I.V.:2237.3] Out: 2685 [Urine:2685] Intake/Output this shift:     Basename 12/18/11 0557  HGB 8.9*    Basename 12/18/11 0557  WBC 11.6*  RBC 3.17*  HCT 27.7*  PLT 377    Basename 12/18/11 0557  NA 129*  K 4.1  CL 94*  CO2 28  BUN 10  CREATININE 1.23  GLUCOSE 110*  CALCIUM 8.9    Basename 12/18/11 0557  LABPT --  INR 1.15    Sensation intact distally Dorsiflexion/Plantar flexion intact Incision: dressing C/D/I and scant drainage  Assessment/Plan: 2 Days Post-Op Procedure(s) (LRB): TOTAL HIP ARTHROPLASTY (Left) ABLA Advance diet Up with therapy Plan for discharge tomorrow  Haskel Khan 12/19/2011, 7:31 AM

## 2011-12-19 NOTE — Progress Notes (Signed)
OT NOTE:  OT consult received and appreciated. Noted pt. With plans to D/C to SNF when medically able and will defer OT needs to SNF. Will sign off acutely. Thanks!  Cassandria Anger, OTR/L Pager: [phone] 12/19/2011 .

## 2011-12-20 LAB — PROTIME-INR
INR: 1.52 — ABNORMAL HIGH (ref 0.00–1.49)
Prothrombin Time: 18.6 seconds — ABNORMAL HIGH (ref 11.6–15.2)

## 2011-12-20 LAB — CBC
Hemoglobin: 8.1 g/dL — ABNORMAL LOW (ref 13.0–17.0)
RBC: 2.87 MIL/uL — ABNORMAL LOW (ref 4.22–5.81)
WBC: 9.1 10*3/uL (ref 4.0–10.5)

## 2011-12-20 MED ORDER — WARFARIN SODIUM 7.5 MG PO TABS
7.5000 mg | ORAL_TABLET | Freq: Once | ORAL | Status: DC
  Filled 2011-12-20: qty 1

## 2011-12-20 NOTE — Progress Notes (Signed)
.  Clinical social worker assisted with patient discharge to skilled nursing facility. .Patient transportation provided by [employer] [facility] with patient chart copy. .No further Clinical Social Work needs, signing off.   Catha Gosselin, Theresia Majors  [phone] .12/20/2011 13:56pm

## 2011-12-20 NOTE — Progress Notes (Signed)
Pt was planning on discharging to [facility] for short term rehab. However facility resended bed offer. Pt plans to dc to [facility] as pt second choice. Pt dc pending insurance authorization from [place]. Pt anticipated to dc today to [facility].   .Clinical social worker continuing to follow pt to assist with pt dc plans and further csw needs.   Catha Gosselin, [place]  [phone] .12/20/2011 11:49am  Assisted Bonnye Fava [phone]

## 2011-12-20 NOTE — Progress Notes (Signed)
ANTICOAGULATION CONSULT NOTE - Follow Up Consult  Pharmacy Consult for warfarin Indication: VTE pxl  Allergies  Allergen Reactions  . Morphine And Related Itching  . Tramadol Itching    Patient Measurements: Height: 6' 0.5" (184.2 cm) Weight: 146 lb 9.7 oz (66.5 kg) (from preadmit record) IBW/kg (Calculated) : 78.75  Heparin Dosing Weight:   Vital Signs: Temp: 98.2 F (36.8 C) (03/08 0533) Temp src: Oral (03/08 0533) BP: 113/50 mmHg (03/08 0533) Pulse Rate: 87  (03/08 0533)  Labs:  Basename 12/20/11 0512 12/19/11 0610 12/18/11 0557  HGB 8.1* 9.8* --  HCT 25.0* 30.6* 27.7*  PLT 378 444* 377  APTT -- -- --  LABPROT 18.6* 16.6* 14.9  INR 1.52* 1.32 1.15  HEPARINUNFRC -- -- --  CREATININE -- 1.07 1.23  CKTOTAL -- -- --  CKMB -- -- --  TROPONINI -- -- --   Estimated Creatinine Clearance: 76.8 ml/min (by C-G formula based on Cr of 1.07).   Medications:  Scheduled:     . aspirin EC  81 mg Oral Daily  . bimatoprost  1 drop Both Eyes QHS  . docusate sodium  100 mg Oral BID  . enoxaparin  40 mg Subcutaneous Q24H  . fluticasone  2 spray Each Nare Daily  . hydrochlorothiazide  25 mg Oral Daily  . lisinopril  20 mg Oral Daily  . mulitivitamin with minerals  1 tablet Oral Daily  . pneumococcal 23 valent vaccine  0.5 mL Intramuscular Tomorrow-1000  . senna  1 tablet Oral BID  . traZODone  150 mg Oral QHS  . warfarin  7.5 mg Oral ONCE-1800  . warfarin   Does not apply Once  . Warfarin - Pharmacist Dosing Inpatient   Does not apply q1800    Assessment: 51 yo M on warfarin for VTE px s/p L THA. Baseline INR 0.98 on 12/06/11. Baseline Hgb only 10.8.  Coumadin: INR up to 1.52. Also on Lovenox 40mg /d until INR >2.  Cards: HTN on lisinopril/HCTZ. VSS  Endo: h/o thyroid nodule and hyperthyroid. No current meds.  Neuro: DJD L knee, cervical spinal stenosis s/p back surgery 12/12.  Goal of Therapy:  INR 2-3  Plan:  Lovenox 40mg /day until INR>2. Coumadin 7.5 po x 1  again today.  Misty Stanley, PharmD 12/20/11, [phone]

## 2011-12-20 NOTE — Progress Notes (Signed)
Physical Therapy Note   12/20/11 0800  PT Visit Information  Last PT Received On 12/20/11  Precautions  Precautions Posterior Hip  Precaution Booklet Issued Yes (comment)  Precaution Comments Reviewed Hip Precautions.    Restrictions  Weight Bearing Restrictions Yes  LLE Weight Bearing WBAT  Bed Mobility  Bed Mobility Yes  Supine to Sit 4: Min assist  Supine to Sit Details (indicate cue type and reason) A for L LE only.    Sitting - Scoot to Edge of Bed 6: Modified independent (Device/Increase time)  Sitting - Scoot to Edge of Bed Details (indicate cue type and reason) increased time  Transfers  Transfers Yes  Sit to Stand 5: Supervision;With upper extremity assist;From bed  Sit to Stand Details (indicate cue type and reason) cues for hip precautions  Stand to Sit 5: Supervision;With upper extremity assist;With armrests;To chair/3-in-1  Stand to Sit Details cues for hip precautions, control descent  Ambulation/Gait  Ambulation/Gait Yes  Ambulation/Gait Assistance 4: Min assist  Ambulation/Gait Assistance Details (indicate cue type and reason) cues for sequencing, hip precautions, incresaed BOS.    Ambulation Distance (Feet) 110 Feet  Assistive device Rolling walker  Gait Pattern Step-to pattern;Decreased stride length;Shuffle;Trunk flexed  Stairs No  Wheelchair Mobility  Wheelchair Mobility No  Posture/Postural Control  Posture/Postural Control No significant limitations  Balance  Balance Assessed No  PT - End of Session  Equipment Utilized During Treatment Gait belt  Activity Tolerance Patient limited by pain;Patient limited by fatigue  Patient left in chair;with call bell in reach  Nurse Communication Mobility status for transfers;Mobility status for ambulation  General  Behavior During Session [facility] for tasks performed  Cognition [facility] for tasks performed  PT - Assessment/Plan  Comments on Treatment Session pt rpesents s/p THA.  pt notes ready to D/C to SNF and anxious  about disability paperwork.  Spoke with Case Manager who is contacting SW to A with pt's disability papers.    PT Plan Discharge plan remains appropriate;Frequency remains appropriate  PT Frequency 7X/week  Follow Up Recommendations Skilled nursing facility  Equipment Recommended Defer to next venue  Acute Rehab PT Goals  PT Goal: Supine/Side to Sit - Progress Progressing toward goal  PT Goal: Sit to Stand - Progress Progressing toward goal  PT Goal: Ambulate - Progress Progressing toward goal    Mack Hook, PT [phone]

## 2011-12-20 NOTE — Progress Notes (Signed)
PT Cancellation Note  Treatment cancelled today due to pt OOF for smoke.  Will try later.    Sunny Schlein, [place] [phone] 12/20/2011, 8:49 AM

## 2011-12-20 NOTE — Progress Notes (Signed)
3 Days Post-Op   Subjective:  Patient reports pain as moderate. He denies any lightheadedness or dizziness.  Objective:   VITALS:   Filed Vitals:   12/19/11 0957 12/19/11 1431 12/19/11 2059 12/20/11 0533  BP: 120/65 107/54 109/58 113/50  Pulse:  95 99 87  Temp:  98.5 F (36.9 C) 99 F (37.2 C) 98.2 F (36.8 C)  TempSrc:   Oral Oral  Resp:  20 20 18   Height:      Weight:      SpO2:  100% 100% 100%   Surgical wounds with a dressing intact, that is clean and dry.  LABS Lab Results  Component Value Date   HGB 8.1* 12/20/2011   HGB 9.8* 12/19/2011   HGB 8.9* 12/18/2011   CBC    Component Value Date/Time   WBC 9.1 12/20/2011 0512   RBC 2.87* 12/20/2011 0512   HGB 8.1* 12/20/2011 0512   HCT 25.0* 12/20/2011 0512   PLT 378 12/20/2011 0512   MCV 87.1 12/20/2011 0512   MCH 28.2 12/20/2011 0512   MCHC 32.4 12/20/2011 0512   RDW 14.5 12/20/2011 0512   LYMPHSABS 1.5 04/25/2011 1624   MONOABS 0.8 04/25/2011 1624   EOSABS 0.4 04/25/2011 1624   BASOSABS 0.1 04/25/2011 1624    Lab Results  Component Value Date   INR 1.52* 12/20/2011   Lab Results  Component Value Date   NA 131* 12/19/2011   K 3.9 12/19/2011   CL 94* 12/19/2011   CO2 28 12/19/2011   BUN 10 12/19/2011   CREATININE 1.07 12/19/2011   GLUCOSE 103* 12/19/2011   Dg Chest 2 View  12/06/2011  *RADIOLOGY REPORT*  Clinical Data: Preoperative evaluation.  Smoking history of one pack per day.  No known COPD are heart disease.  Hypertension.  No current chest complaints  CHEST - 2 VIEW  Comparison: 10/10/2006 and 10/03/2011  Findings: Overall decreased lung volume is noted associated with the right hemithorax.  Heart size and mediastinal contours are within normal limits.  A veiling density persists over the mid and lower portions of the right hemithorax with associated chronic scarring changes laterally in the lower lung zone and centrally in the midlung zone. Persistent lateral pleural density is noted. This could be related to either chronic  pleural thickening or chronic loculated fluid given the appearance on the lateral view. These findings are all stable in comparison with the most recent exam from 09/2011.  No new pulmonary findings are seen.  Bony structures appear intact.  IMPRESSION: Stable chronic changes at the right hemithorax as noted above.  No new focal or acute abnormalities identified.  Original Report Authenticated By: Bertha Stakes, M.D.   Dg Pelvis Portable  12/17/2011  *RADIOLOGY REPORT*  Clinical Data: Postop left hip replacement.  PORTABLE PELVIS  Comparison: None.  Findings: Changes of left hip replacement.  No hardware or bony complicating feature.  Soft tissue and joint space gas noted.  IMPRESSION: Left hip replacement.  No complicating feature.  Original Report Authenticated By: Cyndie Chime, M.D.   Dg Hip Portable 1 View Left  12/17/2011  *RADIOLOGY REPORT*  Clinical Data: Postop left hip replacement.  PORTABLE LEFT HIP - 1 VIEW  Comparison: Pelvic image earlier today.  Findings: Changes of left hip replacement.  Normal alignment.  No hardware or bony complicating feature.  IMPRESSION: Left hip replacement without complicating feature.  Original Report Authenticated By: Cyndie Chime, M.D.    Assessment/Plan: Principal Problem:  *Osteoarthritis  of left hip Active Problems:  Anemia associated with acute blood loss  Hyponatremia   Discharge to SNF   Shimshon Narula P 12/20/2011, 6:59 AM

## 2011-12-20 NOTE — Discharge Summary (Signed)
Physician Discharge Summary  Patient ID: Mark Dunlap MRN: [id number] DOB/AGE: 1961/08/28 51 y.o.  Admit date: 12/17/2011 Discharge date: 12/20/2011  Admission Diagnoses:  Osteoarthritis of left hip  Discharge Diagnoses:  Principal Problem:  *Osteoarthritis of left hip Active Problems:  Anemia associated with acute blood loss  Hyponatremia   Past Medical History  Diagnosis Date  . THYROID NODULE, RIGHT 03/02/2010  . Adjustment disorder with depressed mood 09/2007  . 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 HYPOTENSION 10/31/2009  . Acute prostatitis 05/23/2009  . SPINAL STENOSIS, CERVICAL 08/08/2009  . H/O: substance abuse     hx of ETOH/Crack cocaine-none since 11/08 per pt/Does not Drive due to this  . DJD (degenerative joint disease) of knee     left knee  . DJD (degenerative joint disease) of hip   . HYPERTENSION 08/30/2008  . HYPERTHYROIDISM 02/13/2010    pt was told by  Dr Jonny Ruiz  that thyroid was now back to normal .. 2012 ...   . CERVICAL RADICULOPATHY, LEFT 08/30/2008    Surgeries: Procedure(s): TOTAL HIP ARTHROPLASTY on 12/17/2011   Consultants (if any):    Discharged Condition: Improved  Hospital Course: Mark Dunlap is an 51 y.o. male who was admitted 12/17/2011 with a diagnosis of Osteoarthritis of left hip and went to the operating room on 12/17/2011 and underwent the above named procedures.    He was given perioperative antibiotics:  Anti-infectives     Start     Dose/Rate Route Frequency Ordered Stop   12/17/11 1330   ceFAZolin (ANCEF) IVPB 1 g/50 mL premix        1 g 100 mL/hr over 30 Minutes Intravenous Every 6 hours 12/17/11 1208 12/18/11 1029   12/17/11 0000   ceFAZolin (ANCEF) IVPB 1 g/50 mL premix        1 g 100 mL/hr over 30 Minutes Intravenous 60 min pre-op 12/16/11 1431 12/17/11 0745        .  He was given sequential compression devices, early ambulation, and chemoprophylaxis for DVT  prophylaxis.  He benefited maximally from the hospital stay and there were no complications.    Recent vital signs:  Filed Vitals:   12/20/11 0533  BP: 113/50  Pulse: 87  Temp: 98.2 F (36.8 C)  Resp: 18    Recent laboratory studies:  Lab Results  Component Value Date   HGB 8.1* 12/20/2011   HGB 9.8* 12/19/2011   HGB 8.9* 12/18/2011   Lab Results  Component Value Date   WBC 9.1 12/20/2011   PLT 378 12/20/2011   Lab Results  Component Value Date   INR 1.52* 12/20/2011   Lab Results  Component Value Date   NA 131* 12/19/2011   K 3.9 12/19/2011   CL 94* 12/19/2011   CO2 28 12/19/2011   BUN 10 12/19/2011   CREATININE 1.07 12/19/2011   GLUCOSE 103* 12/19/2011    Discharge Medications:   Medication List  As of 12/20/2011  6:57 AM   STOP taking these medications         acetaminophen 500 MG tablet      HYDROcodone-acetaminophen 7.5-500 MG per tablet      oxyCODONE-acetaminophen 5-325 MG per tablet         TAKE these medications         aspirin 81 MG tablet   Take 81 mg by mouth daily.      bimatoprost 0.01 % Soln  Commonly known as: LUMIGAN   Place 1 drop into both eyes at bedtime.      enoxaparin 40 MG/0.4ML Soln   Commonly known as: LOVENOX   Inject 0.4 mLs (40 mg total) into the skin daily.      fluticasone 50 MCG/ACT nasal spray   Commonly known as: FLONASE   Place 2 sprays into the nose daily.      HYDROcodone-acetaminophen 10-325 MG per tablet   Commonly known as: NORCO   Take 1 tablet by mouth every 6 (six) hours as needed for pain.      lisinopril-hydrochlorothiazide 20-25 MG per tablet   Commonly known as: PRINZIDE,ZESTORETIC   Take 1 tablet by mouth daily.      methocarbamol 500 MG tablet   Commonly known as: ROBAXIN   Take 1 tablet (500 mg total) by mouth 4 (four) times daily.      mulitivitamin with minerals Tabs   Take 1 tablet by mouth daily.      promethazine 25 MG tablet   Commonly known as: PHENERGAN   Take 1 tablet (25 mg total) by mouth every 6  (six) hours as needed for nausea.      traZODone 150 MG tablet   Commonly known as: DESYREL   Take 150 mg by mouth at bedtime.      warfarin 5 MG tablet   Commonly known as: COUMADIN   Take 1 tablet (5 mg total) by mouth daily.            Diagnostic Studies: Dg Chest 2 View  12/06/2011  *RADIOLOGY REPORT*  Clinical Data: Preoperative evaluation.  Smoking history of one pack per day.  No known COPD are heart disease.  Hypertension.  No current chest complaints  CHEST - 2 VIEW  Comparison: 10/10/2006 and 10/03/2011  Findings: Overall decreased lung volume is noted associated with the right hemithorax.  Heart size and mediastinal contours are within normal limits.  A veiling density persists over the mid and lower portions of the right hemithorax with associated chronic scarring changes laterally in the lower lung zone and centrally in the midlung zone. Persistent lateral pleural density is noted. This could be related to either chronic pleural thickening or chronic loculated fluid given the appearance on the lateral view. These findings are all stable in comparison with the most recent exam from 09/2011.  No new pulmonary findings are seen.  Bony structures appear intact.  IMPRESSION: Stable chronic changes at the right hemithorax as noted above.  No new focal or acute abnormalities identified.  Original Report Authenticated By: Bertha Stakes, M.D.   Dg Pelvis Portable  12/17/2011  *RADIOLOGY REPORT*  Clinical Data: Postop left hip replacement.  PORTABLE PELVIS  Comparison: None.  Findings: Changes of left hip replacement.  No hardware or bony complicating feature.  Soft tissue and joint space gas noted.  IMPRESSION: Left hip replacement.  No complicating feature.  Original Report Authenticated By: Cyndie Chime, M.D.   Dg Hip Portable 1 View Left  12/17/2011  *RADIOLOGY REPORT*  Clinical Data: Postop left hip replacement.  PORTABLE LEFT HIP - 1 VIEW  Comparison: Pelvic image earlier today.   Findings: Changes of left hip replacement.  Normal alignment.  No hardware or bony complicating feature.  IMPRESSION: Left hip replacement without complicating feature.  Original Report Authenticated By: Cyndie Chime, M.D.    Disposition: 01-Home or Self Care  Discharge Orders    Future Orders Please Complete By Expires   Diet  general      Call MD / Call 911      Comments:   If you experience chest pain or shortness of breath, CALL 911 and be transported to the hospital emergency room.  If you develope a fever above 101 F, pus (white drainage) or increased drainage or redness at the wound, or calf pain, call your surgeon's office.   Constipation Prevention      Comments:   Drink plenty of fluids.  Prune juice may be helpful.  You may use a stool softener, such as Colace (over the counter) 100 mg twice a day.  Use MiraLax (over the counter) for constipation as needed.   Increase activity slowly as tolerated      Weight Bearing as taught in Physical Therapy      Comments:   Use a walker or crutches as instructed.   Discharge wound care:      Comments:   If you have a hip bandage, keep it clean and dry.  Change your bandage as instructed by your health care providers.  If your bandage has been discontinued, keep your incision clean and dry.  Pat dry after bathing.  DO NOT put lotion or powder on your incision.   Follow the hip precautions as taught in Physical Therapy      Change dressing      Comments:   You may change your dressing in 3 days, then change the dressing daily with sterile 4 x 4 inch gauze dressing and paper tape.  You may clean the incision with alcohol prior to redressing   TED hose      Comments:   Use stockings (TED hose) for 2 weeks on both leg(s).  You may remove them at night for sleeping.      Follow-up Information    Follow up with Miabella Shannahan P, MD in 2 weeks.   Contact information:   [facility] [address] [place] [address] [phone]           Signed: Eulas Post 12/20/2011, 6:57 AM

## 2012-01-03 ENCOUNTER — Other Ambulatory Visit (INDEPENDENT_AMBULATORY_CARE_PROVIDER_SITE_OTHER): Payer: Medicare Other

## 2012-01-03 ENCOUNTER — Encounter: Payer: Self-pay | Admitting: Internal Medicine

## 2012-01-03 ENCOUNTER — Ambulatory Visit (INDEPENDENT_AMBULATORY_CARE_PROVIDER_SITE_OTHER): Payer: Medicare Other | Admitting: Internal Medicine

## 2012-01-03 VITALS — BP 100/62 | HR 100 | Temp 98.1°F | Ht 73.5 in | Wt 140.0 lb

## 2012-01-03 DIAGNOSIS — D62 Acute posthemorrhagic anemia: Secondary | ICD-10-CM

## 2012-01-03 DIAGNOSIS — E871 Hypo-osmolality and hyponatremia: Secondary | ICD-10-CM

## 2012-01-03 DIAGNOSIS — R634 Abnormal weight loss: Secondary | ICD-10-CM

## 2012-01-03 DIAGNOSIS — Z0000 Encounter for general adult medical examination without abnormal findings: Secondary | ICD-10-CM

## 2012-01-03 LAB — BASIC METABOLIC PANEL
CO2: 29 mEq/L (ref 19–32)
Chloride: 97 mEq/L (ref 96–112)
GFR: 112.56 mL/min (ref >60.00)
Glucose, Bld: 75 mg/dL (ref 70–99)
Potassium: 4.2 mEq/L (ref 3.5–5.1)
Sodium: 134 mEq/L — ABNORMAL LOW (ref 135–145)

## 2012-01-03 LAB — CBC WITH DIFFERENTIAL/PLATELET
Basophils Absolute: 0.1 10*3/uL (ref 0.0–0.1)
HCT: 32.1 % — ABNORMAL LOW (ref 39.0–52.0)
Hemoglobin: 10.3 g/dL — ABNORMAL LOW (ref 13.0–17.0)
Lymphs Abs: 1.8 10*3/uL (ref 0.7–4.0)
MCHC: 32.1 g/dL (ref 30.0–36.0)
Monocytes Relative: 8.7 % (ref 3.0–12.0)
Neutro Abs: 4.7 10*3/uL (ref 1.4–7.7)
RDW: 15.5 % — ABNORMAL HIGH (ref 11.5–14.6)

## 2012-01-03 NOTE — Assessment & Plan Note (Signed)
Unclear etiology, suspect depression? But pt denies, for tsh

## 2012-01-03 NOTE — Progress Notes (Signed)
Subjective:    Patient ID: Mark Dunlap, male    DOB: 1960-12-23, 51 y.o.   MRN: [id number]  HPI  Here to f/u s/p left hip THA, also s/o c-spine surgury dc 2012, walking with walker today but overall pain controlled, no recent falls.  Does have sense of ongoing fatigue, but denies signficant hypersomnolence.  No overt bleedingor bruising.  Pt denies chest pain, increased sob or doe, wheezing, orthopnea, PND, increased LE swelling, palpitations, dizziness or syncope.  Pt denies new neurological symptoms such as new headache, or facial or extremity weakness or numbness   Pt denies polydipsia, polyuria.   Pt denies fever, wt loss, night sweats, loss of appetite, or other constitutional symptoms. No recent periop DVT.  Does mentions wt loss recent postop several times and is very concerned.  Denies worsening depressive symptoms, suicidal ideation, or panic, though has ongoing anxiety.  No recent TSH. Appetite just now starting to improve it seems.  Last hgb 8.1 postop, na 130 Past Medical History  Diagnosis Date  . THYROID NODULE, RIGHT 03/02/2010  . Adjustment disorder with depressed mood 09/2007  . 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 HYPOTENSION 10/31/2009  . Acute prostatitis 05/23/2009  . SPINAL STENOSIS, CERVICAL 08/08/2009  . H/O: substance abuse     hx of ETOH/Crack cocaine-none since 11/08 per pt/Does not Drive due to this  . DJD (degenerative joint disease) of knee     left knee  . DJD (degenerative joint disease) of hip   . HYPERTENSION 08/30/2008  . HYPERTHYROIDISM 02/13/2010    pt was told by  Dr Mark Dunlap  that thyroid was now back to normal .. 2012 ...   . CERVICAL RADICULOPATHY, LEFT 08/30/2008   Past Surgical History  Procedure Date  . Right hand i&d 12/07    s/p rdue to abscess-Dr. Amanda Dunlap  . Anterior cervical decomp/discectomy fusion 10/11/2011    Procedure: ANTERIOR CERVICAL DECOMPRESSION/DISCECTOMY FUSION 3 LEVELS;  Surgeon:  Mark Dunlap;  Location: [facility];  Service: Neurosurgery;  Laterality: N/A;  Cervical three-four ,cervical four five cervical five six Anterior Cervical Decompression Fusion with peek + plate Nuvasive translational plate Orthofix peek (2 1/2 hours) Rm # 32  . Total hip arthroplasty 12/17/2011    Procedure: TOTAL HIP ARTHROPLASTY;  Surgeon: Mark Post, MD;  Location: [facility];  Service: Orthopedics;  Laterality: Left;    reports that he has been smoking.  He does not have any smokeless tobacco history on file. He reports that he drinks alcohol. He reports that he uses illicit drugs ("Crack" cocaine). family history includes Alcohol abuse in his brother, cousin, and father; Arthritis in his mother; Diabetes in his brother and sister; Goiter in his mother and sister; Heart disease in his father and other; Hyperlipidemia in his mother; Hypertension in his sister; and Stroke in his father. Allergies  Allergen Reactions  . Morphine And Related Itching  . Tramadol Itching   Current Outpatient Prescriptions on File Prior to Visit  Medication Sig Dispense Refill  . aspirin 81 MG tablet Take 81 mg by mouth daily.        . bimatoprost (LUMIGAN) 0.01 % SOLN Place 1 drop into both eyes at bedtime.      . enoxaparin (LOVENOX) 40 MG/0.4ML SOLN Inject 0.4 mLs (40 mg total) into the skin daily.  5 Syringe  0  . fluticasone (FLONASE) 50 MCG/ACT nasal spray Place 2 sprays into the  nose daily.  16 g  5  . lisinopril-hydrochlorothiazide (PRINZIDE,ZESTORETIC) 20-25 MG per tablet Take 1 tablet by mouth daily.        . Multiple Vitamin (MULITIVITAMIN WITH MINERALS) TABS Take 1 tablet by mouth daily.      . traZODone (DESYREL) 150 MG tablet Take 150 mg by mouth at bedtime.        [name] warfarin (COUMADIN) 5 MG tablet Take 1 tablet (5 mg total) by mouth daily.  30 tablet  1   Review of Systems Review of Systems  Constitutional: Negative for diaphoresis and unexpected weight change.  HENT: Negative for drooling  and tinnitus.   Eyes: Negative for photophobia and visual disturbance.  Respiratory: Negative for choking and stridor.   Gastrointestinal: Negative for vomiting and blood in stool.  Genitourinary: Negative for hematuria and decreased urine volume.  Musculoskeletal: Negative for gait problem.  Skin: Negative for color change and wound.  Neurological: Negative for tremors and numbness.  Psychiatric/Behavioral: Negative for decreased concentration. The patient is not hyperactive.       Objective:   Physical Exam BP 100/62  Pulse 100  Temp(Src) 98.1 F (36.7 C) (Oral)  Ht 6' 1.5" (1.867 m)  Wt 140 lb (63.504 kg)  BMI 18.22 kg/m2  SpO2 97% Physical Exam  VS noted Constitutional: Pt appears well-developed and well-nourished. Mark Dunlap HENT: Head: Normocephalic.  Right Ear: External ear normal.  Left Ear: External ear normal.  Eyes: Conjunctivae and EOM are normal. Pupils are equal, round, and reactive to light.  Neck: Normal range of motion. Neck supple.  Cardiovascular: Normal rate and regular rhythm.   Pulmonary/Chest: Effort normal and breath sounds normal.  Abd:  Soft, NT, non-distended, + BS Neurological: Pt is Dunlap. No cranial nerve deficit. motor intact Skin: Skin is warm. No erythema.  Psychiatric: Pt behavior is normal. Thought content normal.     Assessment & Plan:

## 2012-01-03 NOTE — Assessment & Plan Note (Signed)
stable overall by hx and exam, most recent data reviewed with pt, and pt to check hgb f/u

## 2012-01-03 NOTE — Patient Instructions (Signed)
Continue all other medications as before Please keep your appointments with your specialists as you have planned Please go to LAB in the Basement for the blood and/or urine tests to be done today You will be contacted by phone if any changes need to be made immediately.  Otherwise, you will receive a letter about your results with an explanation.

## 2012-01-03 NOTE — Assessment & Plan Note (Signed)
For f/u sodium,  to f/u any worsening symptoms or concerns

## 2012-01-06 ENCOUNTER — Ambulatory Visit (INDEPENDENT_AMBULATORY_CARE_PROVIDER_SITE_OTHER): Payer: Medicare Other | Admitting: Internal Medicine

## 2012-01-06 ENCOUNTER — Encounter: Payer: Self-pay | Admitting: Internal Medicine

## 2012-01-06 DIAGNOSIS — I959 Hypotension, unspecified: Secondary | ICD-10-CM | POA: Insufficient documentation

## 2012-01-06 DIAGNOSIS — D62 Acute posthemorrhagic anemia: Secondary | ICD-10-CM

## 2012-01-06 DIAGNOSIS — R634 Abnormal weight loss: Secondary | ICD-10-CM

## 2012-01-06 DIAGNOSIS — E871 Hypo-osmolality and hyponatremia: Secondary | ICD-10-CM

## 2012-01-06 NOTE — Patient Instructions (Addendum)
Please do not take the Lisiopril-Hydrochlorothiazide for now Continue all other medications as before Please keep your appointments with your specialists as you have planned - home health who checks your coumadin later today

## 2012-01-06 NOTE — Assessment & Plan Note (Signed)
stable overall by hx and exam, most recent data reviewed with pt, and pt to continue medical treatment as before  Lab Results  Component Value Date   TSH 0.61 01/03/2012

## 2012-01-06 NOTE — Progress Notes (Signed)
Subjective:    Patient ID: Mark Dunlap, male    DOB: 10/17/1960, 51 y.o.   MRN: [id number]  HPI  Here to f/u, after noted to have SBP 88-90 2 days ago.  Pt asympt at the time, but has some mild dizziness since then, but no frank syncope or other change in ambulation, cont's to work on using the walker s/p hip surgury, a bit frustrated about the whole process taking so long.  Pt denies chest pain, increased sob or doe, wheezing, orthopnea, PND, increased LE swelling, palpitations.  Pt denies new neurological symptoms such as new headache, or facial or extremity weakness or numbness   Pt denies polydipsia, polyuria.  Denies hyper or hypo thyroid symptoms such as voice, skin or hair change. Past Medical History  Diagnosis Date  . THYROID NODULE, RIGHT 03/02/2010  . Adjustment disorder with depressed mood 09/2007  . 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 HYPOTENSION 10/31/2009  . Acute prostatitis 05/23/2009  . SPINAL STENOSIS, CERVICAL 08/08/2009  . H/O: substance abuse     hx of ETOH/Crack cocaine-none since 11/08 per pt/Does not Drive due to this  . DJD (degenerative joint disease) of knee     left knee  . DJD (degenerative joint disease) of hip   . HYPERTENSION 08/30/2008  . HYPERTHYROIDISM 02/13/2010    pt was told by  Dr Jonny Ruiz  that thyroid was now back to normal .. 2012 ...   . CERVICAL RADICULOPATHY, LEFT 08/30/2008   Past Surgical History  Procedure Date  . Right hand i&d 12/07    s/p rdue to abscess-Dr. Amanda Pea  . Anterior cervical decomp/discectomy fusion 10/11/2011    Procedure: ANTERIOR CERVICAL DECOMPRESSION/DISCECTOMY FUSION 3 LEVELS;  Surgeon: Tia Alert;  Location: [facility];  Service: Neurosurgery;  Laterality: N/A;  Cervical three-four ,cervical four five cervical five six Anterior Cervical Decompression Fusion with peek + plate Nuvasive translational plate Orthofix peek (2 1/2 hours) Rm # 32  . Total hip  arthroplasty 12/17/2011    Procedure: TOTAL HIP ARTHROPLASTY;  Surgeon: Eulas Post, MD;  Location: [facility];  Service: Orthopedics;  Laterality: Left;    reports that he has been smoking.  He does not have any smokeless tobacco history on file. He reports that he drinks alcohol. He reports that he uses illicit drugs ("Crack" cocaine). family history includes Alcohol abuse in his brother, cousin, and father; Arthritis in his mother; Diabetes in his brother and sister; Goiter in his mother and sister; Heart disease in his father and other; Hyperlipidemia in his mother; Hypertension in his sister; and Stroke in his father. Allergies  Allergen Reactions  . Morphine And Related Itching  . Tramadol Itching   Current Outpatient Prescriptions on File Prior to Visit  Medication Sig Dispense Refill  . aspirin 81 MG tablet Take 81 mg by mouth daily.        . bimatoprost (LUMIGAN) 0.01 % SOLN Place 1 drop into both eyes at bedtime.      . enoxaparin (LOVENOX) 40 MG/0.4ML SOLN Inject 0.4 mLs (40 mg total) into the skin daily.  5 Syringe  0  . fluticasone (FLONASE) 50 MCG/ACT nasal spray Place 2 sprays into the nose daily.  16 g  5  . lisinopril-hydrochlorothiazide (PRINZIDE,ZESTORETIC) 20-25 MG per tablet Take 1 tablet by mouth daily.        . Multiple Vitamin (MULITIVITAMIN WITH MINERALS) TABS Take 1 tablet by mouth  daily.      . traZODone (DESYREL) 150 MG tablet Take 150 mg by mouth at bedtime.        [name] warfarin (COUMADIN) 5 MG tablet Take 1 tablet (5 mg total) by mouth daily.  30 tablet  1   Review of Systems Review of Systems  Constitutional: Negative for diaphoresis and unexpected weight change.  HENT: Negative for drooling and tinnitus.   Eyes: Negative for photophobia and visual disturbance.  Respiratory: Negative for choking and stridor.   Gastrointestinal: Negative for vomiting and blood in stool.  Genitourinary: Negative for hematuria and decreased urine volume.     Objective:   Physical  Exam BP 102/58  Pulse 109  Temp(Src) 97.7 F (36.5 C) (Oral)  Ht 6' 1.5" (1.867 m)  Wt 141 lb (63.957 kg)  BMI 18.35 kg/m2  SpO2 99% Physical Exam  VS noted Constitutional: Pt appears well-developed and well-nourished.  HENT: Head: Normocephalic.  Right Ear: External ear normal.  Left Ear: External ear normal.  Eyes: Conjunctivae and EOM are normal. Pupils are equal, round, and reactive to light.  Neck: Normal range of motion. Neck supple.  Cardiovascular: Normal rate and regular rhythm.   Pulmonary/Chest: Effort normal and breath sounds normal.  Neurological: Pt is alert. No cranial nerve deficit. motor intact, gait- walks steady with walker Skin: Skin is warm. No erythema. No LE edema    Assessment & Plan:

## 2012-01-06 NOTE — Assessment & Plan Note (Signed)
Also improved last visit  -  Lab Results  Component Value Date   WBC 7.4 01/03/2012   HGB 10.3* 01/03/2012   HCT 32.1* 01/03/2012   MCV 89.5 01/03/2012   PLT 830.0* 01/03/2012

## 2012-01-06 NOTE — Assessment & Plan Note (Signed)
Improved, Continue all other medications as before Lab Results  Component Value Date   WBC 7.4 01/03/2012   HGB 10.3* 01/03/2012   HCT 32.1* 01/03/2012   PLT 830.0* 01/03/2012   GLUCOSE 75 01/03/2012   CHOL 146 04/25/2011   TRIG 109.0 04/25/2011   HDL 44.90 04/25/2011   LDLCALC 79 04/25/2011   ALT 14 12/06/2011   AST 16 12/06/2011   NA 134* 01/03/2012   K 4.2 01/03/2012   CL 97 01/03/2012   CREATININE 0.9 01/03/2012   BUN 16 01/03/2012   CO2 29 01/03/2012   TSH 0.61 01/03/2012   PSA 1.46 04/25/2011   INR 1.52* 12/20/2011

## 2012-01-06 NOTE — Assessment & Plan Note (Signed)
On comadin, no evidecne for overt bleeding or bruising and o/w stable, but appears to be overcontrolled on BP with current med post op; ok to hold off BP med for now with f/u 3 wks

## 2012-01-28 ENCOUNTER — Other Ambulatory Visit (INDEPENDENT_AMBULATORY_CARE_PROVIDER_SITE_OTHER): Payer: Medicare Other

## 2012-01-28 ENCOUNTER — Ambulatory Visit (INDEPENDENT_AMBULATORY_CARE_PROVIDER_SITE_OTHER): Payer: Medicare Other | Admitting: Internal Medicine

## 2012-01-28 ENCOUNTER — Encounter: Payer: Self-pay | Admitting: Internal Medicine

## 2012-01-28 VITALS — BP 122/70 | HR 96 | Temp 97.0°F | Ht 73.5 in | Wt 142.4 lb

## 2012-01-28 DIAGNOSIS — I10 Essential (primary) hypertension: Secondary | ICD-10-CM

## 2012-01-28 DIAGNOSIS — F4321 Adjustment disorder with depressed mood: Secondary | ICD-10-CM

## 2012-01-28 DIAGNOSIS — Z0000 Encounter for general adult medical examination without abnormal findings: Secondary | ICD-10-CM

## 2012-01-28 DIAGNOSIS — D62 Acute posthemorrhagic anemia: Secondary | ICD-10-CM

## 2012-01-28 DIAGNOSIS — Z125 Encounter for screening for malignant neoplasm of prostate: Secondary | ICD-10-CM

## 2012-01-28 DIAGNOSIS — E871 Hypo-osmolality and hyponatremia: Secondary | ICD-10-CM

## 2012-01-28 NOTE — Patient Instructions (Signed)
OK to stay off the blood pressure medication for now Continue all other medications as before Please go to LAB in the Basement for the blood and/or urine tests to be done today - just the PSA which was not able to be done last visit You will be contacted by phone if any changes need to be made immediately.  Otherwise, you will receive a letter about your results with an explanation. Please keep your appointments with your specialists as you have planned - orthopedic and neurosurgury Please return in 6 months, or sooner if needed

## 2012-01-28 NOTE — Progress Notes (Signed)
Subjective:    Patient ID: Mark Dunlap, male    DOB: November 18, 1960, 51 y.o.   MRN: [id number]  HPI  Here to f/u; overall improving ambulatory function s/p joint surgury,  Off coumadin, off BP med as per last visit, not requiring pain med, wants to get back to work but not yet released  - to f/u may 2013, and f/u June 2013 with NS for c-spine.  Pt denies chest pain, increased sob or doe, wheezing, orthopnea, PND, increased LE swelling, palpitations, dizziness or syncope.  Pt denies new neurological symptoms such as new headache, or facial or extremity weakness or numbness   Pt denies polydipsia, polyuria.  No overt bleeding or bruising.  For some reason PSA not done with last labs  Denies worsening depressive symptoms, suicidal ideation, or panic.   Pt denies fever, wt loss, night sweats, loss of appetite, or other constitutional symptoms Past Medical History  Diagnosis Date  . THYROID NODULE, RIGHT 03/02/2010  . Adjustment disorder with depressed mood 09/2007  . 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 HYPOTENSION 10/31/2009  . Acute prostatitis 05/23/2009  . SPINAL STENOSIS, CERVICAL 08/08/2009  . H/O: substance abuse     hx of ETOH/Crack cocaine-none since 11/08 per pt/Does not Drive due to this  . DJD (degenerative joint disease) of knee     left knee  . DJD (degenerative joint disease) of hip   . HYPERTENSION 08/30/2008  . HYPERTHYROIDISM 02/13/2010    pt was told by  Dr Jonny Ruiz  that thyroid was now back to normal .. 2012 ...   . CERVICAL RADICULOPATHY, LEFT 08/30/2008   Past Surgical History  Procedure Date  . Right hand i&d 12/07    s/p rdue to abscess-Dr. Amanda Pea  . Anterior cervical decomp/discectomy fusion 10/11/2011    Procedure: ANTERIOR CERVICAL DECOMPRESSION/DISCECTOMY FUSION 3 LEVELS;  Surgeon: Tia Alert;  Location: [facility];  Service: Neurosurgery;  Laterality: N/A;  Cervical three-four ,cervical four five cervical five  six Anterior Cervical Decompression Fusion with peek + plate Nuvasive translational plate Orthofix peek (2 1/2 hours) Rm # 32  . Total hip arthroplasty 12/17/2011    Procedure: TOTAL HIP ARTHROPLASTY;  Surgeon: Eulas Post, MD;  Location: [facility];  Service: Orthopedics;  Laterality: Left;    reports that he has been smoking.  He does not have any smokeless tobacco history on file. He reports that he drinks alcohol. He reports that he uses illicit drugs ("Crack" cocaine). family history includes Alcohol abuse in his brother, cousin, and father; Arthritis in his mother; Diabetes in his brother and sister; Goiter in his mother and sister; Heart disease in his father and other; Hyperlipidemia in his mother; Hypertension in his sister; and Stroke in his father. Allergies  Allergen Reactions  . Morphine And Related Itching  . Tramadol Itching   Current Outpatient Prescriptions on File Prior to Visit  Medication Sig Dispense Refill  . aspirin 81 MG tablet Take 81 mg by mouth daily.        . bimatoprost (LUMIGAN) 0.01 % SOLN Place 1 drop into both eyes at bedtime.      . traZODone (DESYREL) 150 MG tablet Take 150 mg by mouth at bedtime.        . fluticasone (FLONASE) 50 MCG/ACT nasal spray Place 2 sprays into the nose daily.  16 g  5  . promethazine (PHENERGAN) 25 MG tablet Take 1 tablet (25  mg total) by mouth every 6 (six) hours as needed for nausea.  30 tablet  0   Review of Systems Review of Systems  Constitutional: Negative for diaphoresis and unexpected weight change.  Respiratory: Negative for choking and stridor.   Gastrointestinal: Negative for vomiting and blood in stool.  Genitourinary: Negative for hematuria and decreased urine volume.  Musculoskeletal: walking with walker Skin: Negative for color change and wound.  Neurological: Negative for tremors and numbness.     Objective:   Physical Exam BP 122/70  Pulse 96  Temp(Src) 97 F (36.1 C) (Oral)  Ht 6' 1.5" (1.867 m)  Wt  142 lb 6 oz (64.581 kg)  BMI 18.53 kg/m2  SpO2 99% Physical Exam  VS noted Constitutional: Pt appears well-developed and well-nourished.  HENT: Head: Normocephalic.  Right Ear: External ear normal.  Left Ear: External ear normal.  Eyes: Conjunctivae and EOM are normal. Pupils are equal, round, and reactive to light.  Neck: Normal range of motion. Neck supple.  Cardiovascular: Normal rate and regular rhythm.   Pulmonary/Chest: Effort normal and breath sounds normal.  Neurological: Pt is alert. No cranial nerve deficit.  Skin: Skin is warm. No erythema.  Psychiatric: Pt behavior is normal. Thought content normal. not depressed affect    Assessment & Plan:

## 2012-02-02 ENCOUNTER — Encounter: Payer: Self-pay | Admitting: Internal Medicine

## 2012-02-02 NOTE — Assessment & Plan Note (Signed)
Lab Results  Component Value Date   WBC 7.4 01/03/2012   HGB 10.3* 01/03/2012   HCT 32.1* 01/03/2012   PLT 830.0* 01/03/2012   GLUCOSE 75 01/03/2012   CHOL 146 04/25/2011   TRIG 109.0 04/25/2011   HDL 44.90 04/25/2011   LDLCALC 79 04/25/2011   ALT 14 12/06/2011   AST 16 12/06/2011   NA 134* 01/03/2012   K 4.2 01/03/2012   CL 97 01/03/2012   CREATININE 0.9 01/03/2012   BUN 16 01/03/2012   CO2 29 01/03/2012   TSH 0.61 01/03/2012   PSA 1.91 01/28/2012   INR 1.52* 12/20/2011   Improved,  to f/u any worsening symptoms or concerns

## 2012-02-02 NOTE — Assessment & Plan Note (Signed)
stable overall by hx and exam, and pt to continue medical treatment as before 

## 2012-02-02 NOTE — Assessment & Plan Note (Signed)
Improved,  to f/u any worsening symptoms or concerns Lab Results  Component Value Date   WBC 7.4 01/03/2012   HGB 10.3* 01/03/2012   HCT 32.1* 01/03/2012   MCV 89.5 01/03/2012   PLT 830.0* 01/03/2012

## 2012-02-02 NOTE — Assessment & Plan Note (Signed)
stable overall by hx and exam, most recent data reviewed with pt, and pt to continue medical treatment as before - currently not on BP med at this time  BP Readings from Last 3 Encounters:  01/28/12 122/70  01/06/12 102/58  01/03/12 100/62

## 2012-02-26 ENCOUNTER — Other Ambulatory Visit: Payer: Self-pay | Admitting: Internal Medicine

## 2012-02-26 MED ORDER — HYDROCODONE-ACETAMINOPHEN 10-325 MG PO TABS: 1.0000 | ORAL_TABLET | Freq: Four times a day (QID) | ORAL | Status: AC | PRN

## 2012-02-26 NOTE — Telephone Encounter (Signed)
Done hardcopy to robin  

## 2012-02-26 NOTE — Telephone Encounter (Signed)
Addended by: Corwin Levins on: 02/26/2012 01:51 PM   Modules accepted: Orders

## 2012-02-26 NOTE — Telephone Encounter (Signed)
Patient picked up hardcopy at the front desk at our office today 02/26/12.

## 2012-02-26 NOTE — Telephone Encounter (Signed)
Patient is requesting refill on hydrocodone 10/325.  Please advise as the patient is waiting at the front as has no transportation to come back

## 2012-02-26 NOTE — Telephone Encounter (Signed)
Note done in error.

## 2012-03-10 ENCOUNTER — Telehealth: Payer: Self-pay | Admitting: Internal Medicine

## 2012-03-10 NOTE — Telephone Encounter (Signed)
PT IS REQUESTING AN RX FOR A BLOOD PRESSURE MACHINE.  COULD IT BE CALLED INTO [facility] OR WOULD HE NEED TO PICK IT UP? CALL HIM ON HIS CELL PHONE.

## 2012-03-10 NOTE — Telephone Encounter (Signed)
BP machines are not usually covered by insurance, unless he knows specifically that his insurance does;  Let me know if this is the case

## 2012-03-11 NOTE — Telephone Encounter (Signed)
Patient informed. 

## 2012-03-26 ENCOUNTER — Other Ambulatory Visit: Payer: Self-pay | Admitting: Internal Medicine

## 2012-03-27 ENCOUNTER — Other Ambulatory Visit: Payer: Self-pay | Admitting: Internal Medicine

## 2012-03-27 NOTE — Telephone Encounter (Signed)
Done hardcopy to robin  

## 2012-03-27 NOTE — Telephone Encounter (Signed)
Faxed hardcopy to pharmacy. 

## 2012-04-29 ENCOUNTER — Other Ambulatory Visit: Payer: Self-pay | Admitting: Internal Medicine

## 2012-04-29 NOTE — Telephone Encounter (Signed)
Done hardcopy to robin  

## 2012-04-30 NOTE — Telephone Encounter (Signed)
Faxed hardcopy to pharmacy. 

## 2012-05-04 ENCOUNTER — Ambulatory Visit: Payer: Medicare Other | Admitting: Internal Medicine

## 2012-05-15 ENCOUNTER — Telehealth: Payer: Self-pay | Admitting: *Deleted

## 2012-05-15 NOTE — Telephone Encounter (Signed)
Patient states had been off blood pressure meds for couple months but now BP has gone back up when checked at [facility] and he started back on it himself and was wanting a 90 day refill . Informed patient will let Dr. Jonny Ruiz know of this and will call in medication if he wants you to go back on it. CB# for patient is home #[phone] and may leave message with his mother. He will be at work.

## 2012-05-16 NOTE — Telephone Encounter (Signed)
Ok , but please verify the exact med and dose he is taking and document under med list, and ask pt to make sure he f/u with next visit appt - oct 2013  Please also call with BP in 2 wks

## 2012-05-18 MED ORDER — TRAZODONE HCL 150 MG PO TABS: 150.0000 mg | ORAL_TABLET | Freq: Every day | ORAL | Status: DC

## 2012-05-18 MED ORDER — FLUTICASONE PROPIONATE 50 MCG/ACT NA SUSP: 2.0000 | Freq: Every day | NASAL | Status: DC

## 2012-05-18 MED ORDER — LISINOPRIL-HYDROCHLOROTHIAZIDE 20-25 MG PO TABS: 1.0000 | ORAL_TABLET | Freq: Every day | ORAL | Status: DC

## 2012-05-18 NOTE — Telephone Encounter (Signed)
Called the patient left message to call back 

## 2012-05-18 NOTE — Telephone Encounter (Signed)
Done erx 

## 2012-05-18 NOTE — Telephone Encounter (Signed)
The patient returned call informed he is taking Lisinopril HCTZ 20/25 qd.  Also would like a refill on flonase and trazodone sent to [facility] please advise. Informed to call back in 2 wks and f/u OV in October.

## 2012-05-19 NOTE — Telephone Encounter (Signed)
Patient informed prescriptions sent to pharmacy.

## 2012-06-29 ENCOUNTER — Other Ambulatory Visit: Payer: Self-pay | Admitting: Internal Medicine

## 2012-06-30 NOTE — Telephone Encounter (Signed)
Done hardcopy to robin  

## 2012-06-30 NOTE — Telephone Encounter (Signed)
Faxed hardcopy to pharmacy. 

## 2012-07-10 ENCOUNTER — Other Ambulatory Visit: Payer: Self-pay | Admitting: Neurological Surgery

## 2012-07-10 DIAGNOSIS — M542 Cervicalgia: Secondary | ICD-10-CM

## 2012-07-13 ENCOUNTER — Ambulatory Visit
Admission: RE | Admit: 2012-07-13 | Discharge: 2012-07-13 | Disposition: A | Discharge status: Home / Self Care | Payer: Medicare Other | Source: Ambulatory Visit | Attending: Neurological Surgery | Admitting: Neurological Surgery

## 2012-07-13 DIAGNOSIS — M542 Cervicalgia: Secondary | ICD-10-CM

## 2012-07-29 ENCOUNTER — Ambulatory Visit: Payer: Medicare Other | Admitting: Internal Medicine

## 2012-08-11 ENCOUNTER — Ambulatory Visit: Payer: Medicare Other | Admitting: Internal Medicine

## 2012-08-20 ENCOUNTER — Encounter: Payer: Self-pay | Admitting: Internal Medicine

## 2012-08-20 ENCOUNTER — Ambulatory Visit (INDEPENDENT_AMBULATORY_CARE_PROVIDER_SITE_OTHER): Payer: Medicare Other | Admitting: Internal Medicine

## 2012-08-20 VITALS — BP 110/70 | HR 85 | Temp 98.3°F | Ht 73.5 in | Wt 143.5 lb

## 2012-08-20 DIAGNOSIS — F4321 Adjustment disorder with depressed mood: Secondary | ICD-10-CM

## 2012-08-20 DIAGNOSIS — G8929 Other chronic pain: Secondary | ICD-10-CM

## 2012-08-20 DIAGNOSIS — I10 Essential (primary) hypertension: Secondary | ICD-10-CM

## 2012-08-20 DIAGNOSIS — Z23 Encounter for immunization: Secondary | ICD-10-CM

## 2012-08-20 MED ORDER — PROMETHAZINE HCL 25 MG PO TABS: 25.0000 mg | ORAL_TABLET | Freq: Four times a day (QID) | ORAL | Status: DC | PRN

## 2012-08-20 MED ORDER — HYDROCODONE-ACETAMINOPHEN 10-325 MG PO TABS: ORAL_TABLET | ORAL | Status: DC

## 2012-08-20 NOTE — Patient Instructions (Addendum)
Your Blood Pressure was excellent today Continue all other medications as before OK to stop the oxycodone OK to change to the hydrocodone as you have had in the past Please keep your appointments with your specialists as you have planned - Neurosurgury in Jan 2014 You are given the Blood Pressure monitor prescription today You are given the work note today You had the flu shot today

## 2012-08-22 ENCOUNTER — Encounter: Payer: Self-pay | Admitting: Internal Medicine

## 2012-08-22 DIAGNOSIS — G8929 Other chronic pain: Secondary | ICD-10-CM | POA: Insufficient documentation

## 2012-08-22 NOTE — Assessment & Plan Note (Signed)
stable overall by hx and exam, most recent data reviewed with pt, and pt to continue medical treatment as before BP Readings from Last 3 Encounters:  08/20/12 110/70  01/28/12 122/70  01/06/12 102/58

## 2012-08-22 NOTE — Assessment & Plan Note (Signed)
Ok for change oxycodone to hydrocodone asd, f/u NS as planned jan 2014

## 2012-08-22 NOTE — Assessment & Plan Note (Signed)
stable overall by hx and exam, most recent data reviewed with pt, and pt to continue medical treatment as before Lab Results  Component Value Date   WBC 7.4 01/03/2012   HGB 10.3* 01/03/2012   HCT 32.1* 01/03/2012   PLT 830.0* 01/03/2012   GLUCOSE 75 01/03/2012   CHOL 146 04/25/2011   TRIG 109.0 04/25/2011   HDL 44.90 04/25/2011   LDLCALC 79 04/25/2011   ALT 14 12/06/2011   AST 16 12/06/2011   NA 134* 01/03/2012   K 4.2 01/03/2012   CL 97 01/03/2012   CREATININE 0.9 01/03/2012   BUN 16 01/03/2012   CO2 29 01/03/2012   TSH 0.61 01/03/2012   PSA 1.91 01/28/2012   INR 1.52* 12/20/2011

## 2012-08-22 NOTE — Progress Notes (Signed)
Subjective:    Patient ID: Mark Dunlap, male    DOB: [birth year], 51 y.o.   MRN: [id number]  HPI  Here to f/u; overall doing ok,  Pt denies chest pain, increased sob or doe, wheezing, orthopnea, PND, increased LE swelling, palpitations, dizziness or syncope.  Pt denies new neurological symptoms such as new headache, or facial or extremity weakness or numbness   Pt denies polydipsia, polyuria, or low sugar symptoms such as weakness or confusion improved with po intake.  Pt states overall good compliance with meds, trying to follow lower cholesterol, diabetic diet, wt overall stable but little exercise however.  Overall chronic pain stable but pt asks for oxycodone change to hydrocodone as this is better toleratd.  Has f/u with Neurosurgury in jan 2014.   Denies worsening depressive symptoms, suicidal ideation, or panic, though has ongoing anxiety. Asks for BP monitor rx today to see if would be covered by his insurance. Past Medical History  Diagnosis Date  . THYROID NODULE, RIGHT 03/02/2010  . Adjustment disorder with depressed mood 09/2007  . 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 HYPOTENSION 10/31/2009  . Acute prostatitis 05/23/2009  . SPINAL STENOSIS, CERVICAL 08/08/2009  . H/O: substance abuse     hx of ETOH/Crack cocaine-none since 11/08 per pt/Does not Drive due to this  . DJD (degenerative joint disease) of knee     left knee  . DJD (degenerative joint disease) of hip   . HYPERTENSION 08/30/2008  . HYPERTHYROIDISM 02/13/2010    pt was told by  Dr Jonny Ruiz  that thyroid was now back to normal .. 2012 ...   . CERVICAL RADICULOPATHY, LEFT 08/30/2008   Past Surgical History  Procedure Date  . Right hand i&d 12/07    s/p rdue to abscess-Dr. Amanda Pea  . Anterior cervical decomp/discectomy fusion 10/11/2011    Procedure: ANTERIOR CERVICAL DECOMPRESSION/DISCECTOMY FUSION 3 LEVELS;  Surgeon: Tia Alert;  Location: [facility];  Service:  Neurosurgery;  Laterality: N/A;  Cervical three-four ,cervical four five cervical five six Anterior Cervical Decompression Fusion with peek + plate Nuvasive translational plate Orthofix peek (2 1/2 hours) Rm # 32  . Total hip arthroplasty 12/17/2011    Procedure: TOTAL HIP ARTHROPLASTY;  Surgeon: Eulas Post, MD;  Location: [facility];  Service: Orthopedics;  Laterality: Left;    reports that he has been smoking.  He does not have any smokeless tobacco history on file. He reports that he drinks alcohol. He reports that he uses illicit drugs ("Crack" cocaine). family history includes Alcohol abuse in his brother, cousin, and father; Arthritis in his mother; Diabetes in his brother and sister; Goiter in his mother and sister; Heart disease in his father and other; Hyperlipidemia in his mother; Hypertension in his sister; and Stroke in his father. Allergies  Allergen Reactions  . Morphine And Related Itching  . Tramadol Itching   Review of Systems  Constitutional: Negative for diaphoresis and unexpected weight change.  HENT: Negative for tinnitus.   Eyes: Negative for photophobia and visual disturbance.  Respiratory: Negative for choking and stridor.   Gastrointestinal: Negative for vomiting and blood in stool.  Genitourinary: Negative for hematuria and decreased urine volume.  Musculoskeletal: Negative for gait problem.  Skin: Negative for color change and wound.  Neurological: Negative for tremors and numbness.  Psychiatric/Behavioral: Negative for decreased concentration. The patient is not hyperactive.       Objective:   Physical  Exam BP 110/70  Pulse 85  Temp 98.3 F (36.8 C) (Oral)  Ht 6' 1.5" (1.867 m)  Wt 143 lb 8 oz (65.091 kg)  BMI 18.68 kg/m2  SpO2 99% Physical Exam  VS noted Constitutional: Pt appears well-developed and well-nourished.  HENT: Head: Normocephalic.  Right Ear: External ear normal.  Left Ear: External ear normal.  Eyes: Conjunctivae and EOM are normal.  Pupils are equal, round, and reactive to light.  Neck: Normal range of motion. Neck supple.  Cardiovascular: Normal rate and regular rhythm.   Pulmonary/Chest: Effort normal and breath sounds normal.  Abd:  Soft, NT, non-distended, + BS Neurological: Pt is alert. Not confused  Skin: Skin is warm. No erythema.  Psychiatric: Pt behavior is normal. Thought content normal. 1+ nervous    Assessment & Plan:

## 2012-11-10 ENCOUNTER — Other Ambulatory Visit: Payer: Self-pay | Admitting: Neurological Surgery

## 2012-11-10 DIAGNOSIS — M542 Cervicalgia: Secondary | ICD-10-CM

## 2012-11-20 ENCOUNTER — Ambulatory Visit
Admission: RE | Admit: 2012-11-20 | Discharge: 2012-11-20 | Disposition: A | Discharge status: Home / Self Care | Payer: Medicare Other | Source: Ambulatory Visit | Attending: Neurological Surgery | Admitting: Neurological Surgery

## 2012-11-20 ENCOUNTER — Other Ambulatory Visit: Payer: Self-pay | Admitting: Neurological Surgery

## 2012-11-20 VITALS — BP 155/107 | HR 72

## 2012-11-20 DIAGNOSIS — M545 Low back pain: Secondary | ICD-10-CM

## 2012-11-20 DIAGNOSIS — M542 Cervicalgia: Secondary | ICD-10-CM

## 2012-11-20 DIAGNOSIS — M4802 Spinal stenosis, cervical region: Secondary | ICD-10-CM

## 2012-11-20 DIAGNOSIS — M5412 Radiculopathy, cervical region: Secondary | ICD-10-CM

## 2012-11-20 DIAGNOSIS — G8929 Other chronic pain: Secondary | ICD-10-CM

## 2012-11-20 DIAGNOSIS — M549 Dorsalgia, unspecified: Secondary | ICD-10-CM

## 2012-11-20 MED ORDER — DIPHENHYDRAMINE HCL 50 MG PO CAPS: 50.0000 mg | ORAL_CAPSULE | Freq: Once | ORAL | Status: DC

## 2012-11-20 MED ORDER — MEPERIDINE HCL 100 MG/ML IJ SOLN
75.0000 mg | Freq: Once | INTRAMUSCULAR | Status: AC
  Administered 2012-11-20: 75 mg via INTRAMUSCULAR

## 2012-11-20 MED ORDER — ONDANSETRON HCL 4 MG/2ML IJ SOLN: 4.0000 mg | Freq: Four times a day (QID) | INTRAMUSCULAR | Status: DC | PRN

## 2012-11-20 MED ORDER — IOHEXOL 300 MG/ML  SOLN
10.0000 mL | Freq: Once | INTRAMUSCULAR | Status: AC | PRN
  Administered 2012-11-20: 10 mL via INTRATHECAL

## 2012-11-20 MED ORDER — ONDANSETRON HCL 4 MG/2ML IJ SOLN
4.0000 mg | Freq: Once | INTRAMUSCULAR | Status: AC
  Administered 2012-11-20: 4 mg via INTRAMUSCULAR

## 2012-11-20 NOTE — Progress Notes (Signed)
Pt c/o itching post pain meds and benadryl given by mouth.  Pt states that many of the pain meds cause him to itch, no hives or rash noted.

## 2012-11-20 NOTE — Progress Notes (Signed)
Pt states he has been off of trazodone and phenergan for the past 2 days.

## 2012-12-28 ENCOUNTER — Other Ambulatory Visit: Payer: Self-pay

## 2012-12-28 ENCOUNTER — Other Ambulatory Visit: Payer: Self-pay | Admitting: Internal Medicine

## 2012-12-28 MED ORDER — HYDROCODONE-ACETAMINOPHEN 10-325 MG PO TABS: ORAL_TABLET | ORAL | Status: DC

## 2012-12-28 NOTE — Telephone Encounter (Signed)
Faxed hardcopy to pharmacy and patient was informed as well.

## 2012-12-28 NOTE — Telephone Encounter (Signed)
Done hardcopy to robin  

## 2013-02-24 IMAGING — CT CT CERVICAL SPINE W/ CM
2 of 18 series · 6 of 33 positions shown, 7 images · non-contrast
Comparison: none

CLINICAL DATA: Prior cervical fusion.  Low back pain.
TECHNIQUE: Contiguous axial images were obtained through the
Cervical spine without infusion. Coronal and sagittal
reconstructions were obtained of the axial image sets.
TECHNIQUE: Contiguous axial images were obtained through the lumbar
spine without infusion. Coronal, sagittal, and disc space

[Series 103: sag l-sp · sagittal · 0.47mm/px · 3 of 47 slices shown]
[im 12/47  bone]
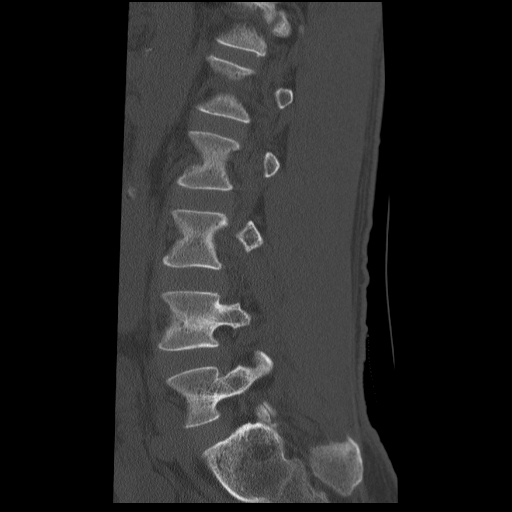
[im 24/47  bone]
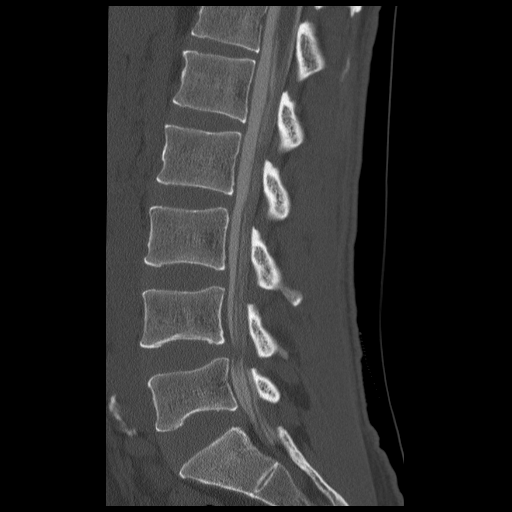
[im 35/47  bone]
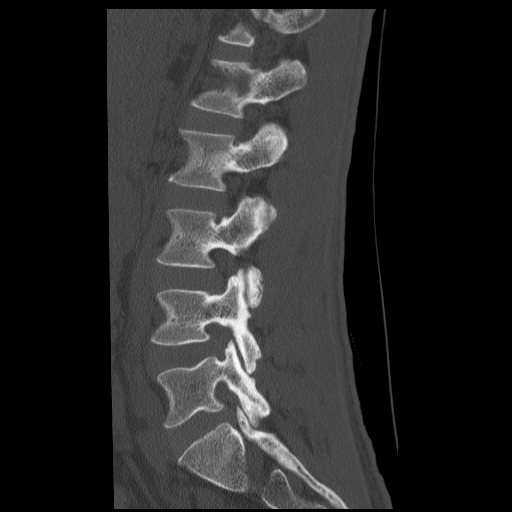

[Series 106: l spine bone · axial · 0.27mm/px · z∈[-306,-58]mm · 3 of 165 slices shown, 4 images]
[im 1/165  soft-tissue]
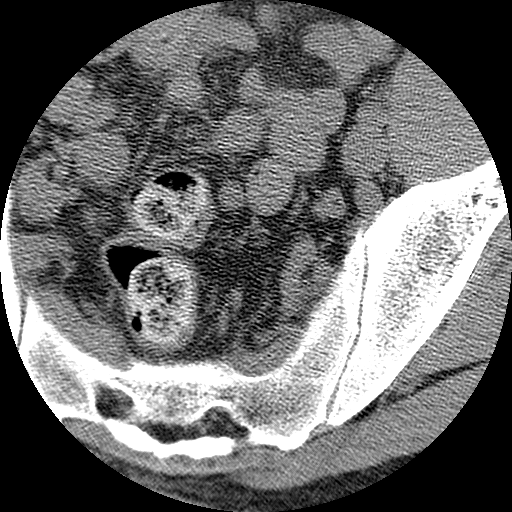
[im 1/165  bone]
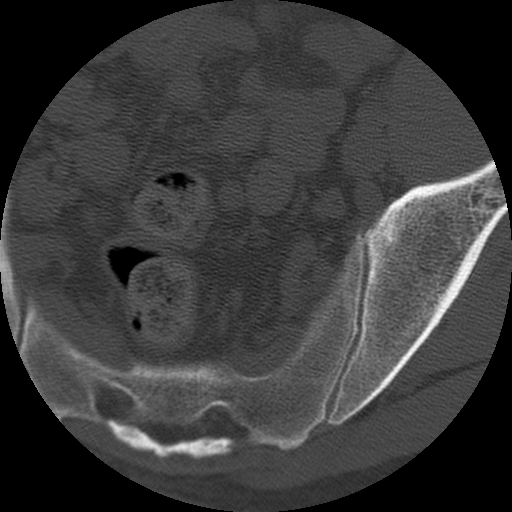
[im 83/165  bone]
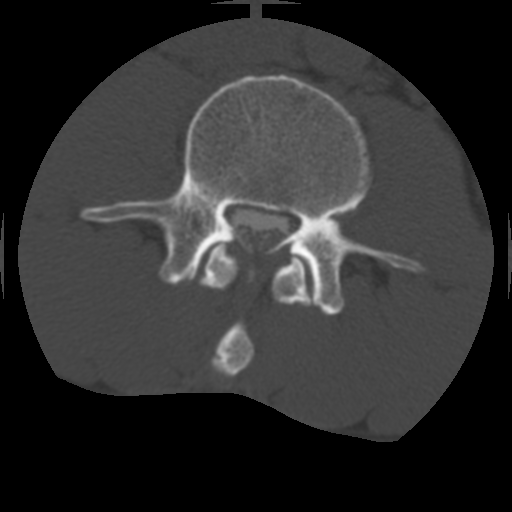
[im 165/165  bone]
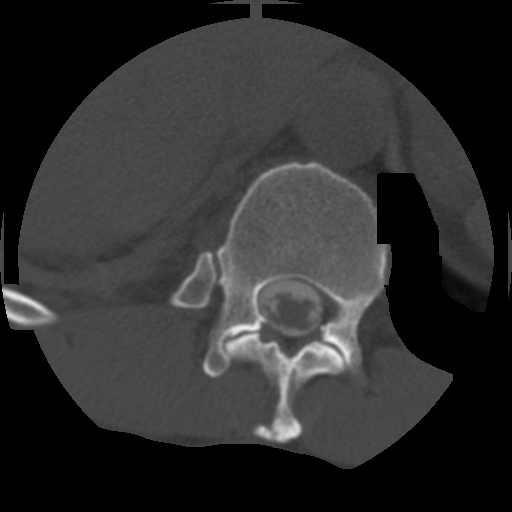

[6 of 33 positions shown; findings below may reference images not displayed]

LUMBAR PUNCTURE FOR CERVICAL AND LUMBAR MYELOGRAM
CERVICAL AND LUMBAR MYELOGRAM
CT CERVICAL MYELOGRAM
CT LUMBAR MYELOGRAM

Procedure: After thorough discussion of risks and benefits of the
procedure including bleeding, infection, injury to nerves, blood
vessels, adjacent structures as well as headache and CSF leak,
written and oral informed consent was obtained.   Consent was
obtained by Dr.Fauzee.

Patient was positioned prone on the fluoroscopy table. Local
anesthesia was provided with 1% lidocaine without epinephrine after
prepped and draped in the usual sterile fashion. Puncture was
performed at L3-HVusing a 3-1/2 inches pencil point 22-gauge spinal
needle via right paramedian approach.  Using a single pass through
the dura, the needle was placed within the thecal sac, with return
of clear CSF. 8 mL of Umnipaque-[id number] was injected into the thecal
sac, with normal opacification of the nerve roots and cauda equina
consistent with free flow within the subarachnoid space.

Fluoroscopy time:  1 minute 40 seconds

I personally performed the lumbar puncture and administered the
intrathecal contrast. I also personally supervised acquisition of
the myelogram images.

CERVICAL AND LUMBAR MYELOGRAM
FINDINGS: There is a small gram shows C3-C6 ACDF.  Interbody bone graft is
present at each level.  There appears to be a mild retrolisthesis
at each level which cannot be quantitated due to postsurgical
changes.  There is not appear to be significant cervical stenosis
at the fusion levels or at the adjacent segment by plain film.  See
CT below.  Lumbar spine shows five lumbar type vertebral bodies.
There is moderate central stenosis at L4-L5 with transverse
narrowing of the thecal sac.  Bilateral lateral recess stenosis
potentially affecting both descending L5 nerve with medial
deviation.

On standing upright radiographs, the alignment anatomic.  Vertebral
body height and intervertebral disc spaces are preserved.  Mild
stenosis is present at L4-L5.  There is some dilution of contrast
due to both cervical and lumbar technique.  There is no pathologic
motion with flexion and extension maneuvers.  Extension and neutral
positions appears similar.
IMPRESSION: 1.  Technically successful lumbar puncture at L3-L4 with pencil
point needle for cervical and lumbar myelogram.
2.  C3-C6 ACDF.
3.  Lumbar spondylosis with moderate central stenosis and lateral
recess stenosis at L4-L5.

CT CERVICAL MYELOGRAM:
FINDINGS: There is loss of the normal cervical lordosis from C2-C6.
ACDF plate is present.  There is no loosening of the ACDF screws.
Delayed union is present with minimal bridging bone at C3-C4 and C4-
C5.  No definite bridging bone at C5-C6.  There is no
pseudoarthrosis.  Posterior osseous ridging is present from C3-C6.
The paraspinal soft tissues are within normal limits. Short
pedicles are present.  Posterior calcification compatible with
ossification of the posterior longitudinal ligament along the
dorsal aspect of vertebral bodies intruding on the central canal.

Craniocervical junction appears normal.  Atlantodental degenerative
disease.

C2-C3:  Right paracentral disc protrusion is present with mild
central stenosis.  Flattening of the right ventral aspect of the
cervical cord.  Foramina are patent.

C3-C4:  Discectomy.  The central osseous ridging is present at C3
and C4, producing central stenosis which is mild.  Indentation of
the ventral cervical cord.  Mild left greater than right foraminal
encroachment associated with uncovertebral spurring.

C4-C5: Prominent osseous ridging at C5 adjacent to the C4-C5 disc
space producing moderate central stenosis with 8 mm AP diameter of
the thecal sac.  The configuration is compatible with ossification
of the posterior longitudinal ligament. C4 central osseous ridging
with mild central stenosis.  Flattening of the cervical cord.  The
mild right greater than left foraminal encroachment associated with
uncovertebral spurring.  Central canal is decompressed at the level
of the disc space.

C5-C6: Discectomy.  Posterior osseous ridging dorsal to C6
producing mild central stenosis with effacement of the ventral
subarachnoid space.  There is mild left greater than right
foraminal encroachment associated with uncovertebral spurring.

C6-C7:  Posterior ligamentum flavum redundancy.  Anterior disc
protrusion is shallow broad-based.  Mild central stenosis.  Mild
right uncovertebral spurring with mild right foraminal
encroachment.  Minimal left foraminal encroachment associated with
uncovertebral spurring.

C7-T1:  Negative.
IMPRESSION: 1.  C2-C3 mild central stenosis associated with right paracentral
protrusion.  Flattening of the right ventral cervical cord.
2.  C3-C4 ACDF.  Osseous ridging at C3 and C4 produces mild central
stenosis.  Mild bilateral foraminal stenosis due to uncovertebral
spurring.
3.  C4-C5 ACDF with osseous ridging at C4 and C5 producing mild
central stenosis.  Bilateral foraminal encroachment due to
uncovertebral spurring. Ossification of the posterior longitudinal
ligament dorsal to C5 producing moderate central stenosis.
4.  C5-C6 ACDF with mild central stenosis dorsal to C6 associated
with osseous ridging.  Mild bilateral foraminal encroachment due to
uncovertebral spurring.
5.  C6-C7 adjacent segment disease with mild central stenosis due
to a shallow broad-based disc protrusion and posterior ligamentum
flavum redundancy.  Mild left greater than right foraminal
encroachment due to uncovertebral spurring.
6.  Congenitally narrow central canal with superimposed
ossification of the posterior longitudinal ligament and short
pedicles.

CT LUMBAR MYELOGRAM
FINDINGS: Lumbar spinal alignment is anatomic.  Spinal cord
terminates posterior to the L1 vertebra.  The paraspinal soft
tissues demonstrate mild aortoiliac atherosclerosis.  Minimal left
sacroiliac joint degenerative disease. Lumbar spinal canal is
congenitally narrow.  Short pedicles are present.

T12-L1:  Negative.

L1-L2:  Negative.

L2-L3:  Negative.

L3-L4:  Minimal disc bulging.  Mild left foraminal encroachment due
to disc bulging and short pedicles, potentially affecting the
exiting left L3 nerve.

L4-L5:  Moderate central stenosis.  Bilateral facet arthrosis,
greater on the left than right.  Crowding of both lateral recesses.
No descending L5 nerve root compression.  The nerve root sleeves
appear similar to other levels, without definite effacement.
Multifactorial moderate right greater than left foraminal stenosis
is present associated with bulging disc, short pedicles and facet
arthrosis.

L5-S1:  Central canal and lateral recesses are patent.  Shallow
disc bulging.  Neural foramina appear adequately patent.
IMPRESSION: 1.  Congenitally narrow central canal with superimposed
degenerative disc disease.
2.  Mild left L3-L4 foraminal encroachment potentially affecting
the exiting left L3 nerve.
3. L4-L5 moderate central stenosis, multifactorial.  Moderate
bilateral foraminal stenosis.  Left greater than right facet
arthrosis.

## 2013-03-11 ENCOUNTER — Ambulatory Visit (INDEPENDENT_AMBULATORY_CARE_PROVIDER_SITE_OTHER): Payer: Medicare Other | Admitting: Internal Medicine

## 2013-03-11 ENCOUNTER — Ambulatory Visit (INDEPENDENT_AMBULATORY_CARE_PROVIDER_SITE_OTHER): Payer: Medicare Other

## 2013-03-11 ENCOUNTER — Encounter: Payer: Self-pay | Admitting: Internal Medicine

## 2013-03-11 ENCOUNTER — Other Ambulatory Visit: Payer: Self-pay | Admitting: Internal Medicine

## 2013-03-11 VITALS — BP 120/72 | HR 95 | Temp 97.2°F | Ht 73.5 in | Wt 148.0 lb

## 2013-03-11 DIAGNOSIS — G8929 Other chronic pain: Secondary | ICD-10-CM

## 2013-03-11 DIAGNOSIS — R252 Cramp and spasm: Secondary | ICD-10-CM

## 2013-03-11 DIAGNOSIS — Z0000 Encounter for general adult medical examination without abnormal findings: Secondary | ICD-10-CM

## 2013-03-11 LAB — CBC WITH DIFFERENTIAL/PLATELET
Basophils Absolute: 0 10*3/uL (ref 0.0–0.1)
Eosinophils Absolute: 0.1 10*3/uL (ref 0.0–0.7)
Hemoglobin: 14.2 g/dL (ref 13.0–17.0)
Lymphocytes Relative: 15 % (ref 12.0–46.0)
MCHC: 33.5 g/dL (ref 30.0–36.0)
Monocytes Relative: 8.9 % (ref 3.0–12.0)
Neutro Abs: 8.3 10*3/uL — ABNORMAL HIGH (ref 1.4–7.7)
Neutrophils Relative %: 75.1 % (ref 43.0–77.0)
RBC: 4.51 Mil/uL (ref 4.22–5.81)
RDW: 15.5 % — ABNORMAL HIGH (ref 11.5–14.6)

## 2013-03-11 LAB — SEDIMENTATION RATE: Sed Rate: 17 mm/hr (ref 0–22)

## 2013-03-11 LAB — BASIC METABOLIC PANEL
CO2: 29 mEq/L (ref 19–32)
Calcium: 9.8 mg/dL (ref 8.4–10.5)
Chloride: 97 mEq/L (ref 96–112)
Creatinine, Ser: 1.2 mg/dL (ref 0.4–1.5)
Glucose, Bld: 96 mg/dL (ref 70–99)

## 2013-03-11 LAB — LDL CHOLESTEROL, DIRECT: Direct LDL: 120.4 mg/dL

## 2013-03-11 LAB — HEPATIC FUNCTION PANEL
ALT: 22 U/L (ref 0–53)
Albumin: 4 g/dL (ref 3.5–5.2)
Bilirubin, Direct: 0.1 mg/dL (ref 0.0–0.3)
Total Protein: 7.4 g/dL (ref 6.0–8.3)

## 2013-03-11 LAB — LIPID PANEL
Cholesterol: 224 mg/dL — ABNORMAL HIGH (ref 0–200)
Total CHOL/HDL Ratio: 3
Triglycerides: 107 mg/dL (ref 0.0–149.0)

## 2013-03-11 LAB — CK: Total CK: 384 U/L — ABNORMAL HIGH (ref 7–232)

## 2013-03-11 MED ORDER — NAPROXEN 500 MG PO TABS: 500.0000 mg | ORAL_TABLET | Freq: Two times a day (BID) | ORAL | Status: DC

## 2013-03-11 MED ORDER — TIZANIDINE HCL 4 MG PO TABS: 4.0000 mg | ORAL_TABLET | Freq: Four times a day (QID) | ORAL | Status: DC | PRN

## 2013-03-11 MED ORDER — TRAZODONE HCL 150 MG PO TABS: 150.0000 mg | ORAL_TABLET | Freq: Every day | ORAL | Status: DC

## 2013-03-11 MED ORDER — ONDANSETRON HCL 4 MG PO TABS: 4.0000 mg | ORAL_TABLET | Freq: Three times a day (TID) | ORAL | Status: DC | PRN

## 2013-03-11 NOTE — Assessment & Plan Note (Signed)

## 2013-03-11 NOTE — Patient Instructions (Signed)
Please take all new medication as prescribed - the antiinflammatory (naprosyn), and muscle relaxer (tizanidine) Please continue all other medications as before, and refills have been done if requested - the trazodone, and nausea medication Please keep your appointments with your specialists as you have planned - Dr Yetta Barre for your neck and upper back pain Please go to the LAB in the Basement (turn left off the elevator) for the tests to be done today You will be contacted by phone if any changes need to be made immediately.  Otherwise, you will receive a letter about your results with an explanation  Please remember to sign up for MyChart if you have not done so, as this will be important to you in the future with finding out test results, communicating by private email, and scheduling acute appointments online when needed.  Please return in 6 months, or sooner if needed

## 2013-03-11 NOTE — Assessment & Plan Note (Addendum)
With ongoing pain, failed recent ESI, needs f/u with Dr Yetta Barre, for nsaid prn

## 2013-03-11 NOTE — Progress Notes (Signed)
Subjective:    Patient ID: Mark Dunlap, male    DOB: 08/28/1961, 52 y.o.   MRN: [id number]  HPI Here for wellness and f/u;  Overall doing ok;  Pt denies CP, worsening SOB, DOE, wheezing, orthopnea, PND, worsening LE edema, palpitations, dizziness or syncope.  Pt denies neurological change such as new headache, facial or extremity weakness.  Pt denies polydipsia, polyuria, or low sugar symptoms. Pt states overall good compliance with treatment and medications, good tolerability, and has been trying to follow lower cholesterol diet.  Pt denies worsening depressive symptoms, suicidal ideation or panic. No fever, night sweats, wt loss, loss of appetite, or other constitutional symptoms.  Pt states good ability with ADL's, has low fall risk, home safety reviewed and adequate, no other significant changes in hearing or vision, and only occasionally active with exercise.  Does have numberous areas of muscle cramping it seems, mostly to left foot, both lower legs, right upper back and bilat upper arms.  On gabapentin per rehab phsycian for pain related to cervical disc dz, is off hydrocodone as trial. Cramps to UE's affect his work, tends to drop things at work.  Mustard and salt seem to him to help, but no more salt since as he know this affects his BP.  Due for trazodone refill. Past Medical History  Diagnosis Date  . THYROID NODULE, RIGHT 03/02/2010  . Adjustment disorder with depressed mood 09/2007  . 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 HYPOTENSION 10/31/2009  . Acute prostatitis 05/23/2009  . SPINAL STENOSIS, CERVICAL 08/08/2009  . H/O: substance abuse     hx of ETOH/Crack cocaine-none since 11/08 per pt/Does not Drive due to this  . DJD (degenerative joint disease) of knee     left knee  . DJD (degenerative joint disease) of hip   . HYPERTENSION 08/30/2008  . HYPERTHYROIDISM 02/13/2010    pt was told by  Dr Jonny Ruiz  that thyroid was now back to  normal .. 2012 ...   . CERVICAL RADICULOPATHY, LEFT 08/30/2008   Past Surgical History  Procedure Laterality Date  . Right hand i&d  12/07    s/p rdue to abscess-Dr. Amanda Pea  . Anterior cervical decomp/discectomy fusion  10/11/2011    Procedure: ANTERIOR CERVICAL DECOMPRESSION/DISCECTOMY FUSION 3 LEVELS;  Surgeon: Tia Alert;  Location: [facility];  Service: Neurosurgery;  Laterality: N/A;  Cervical three-four ,cervical four five cervical five six Anterior Cervical Decompression Fusion with peek + plate Nuvasive translational plate Orthofix peek (2 1/2 hours) Rm # 32  . Total hip arthroplasty  12/17/2011    Procedure: TOTAL HIP ARTHROPLASTY;  Surgeon: Eulas Post, MD;  Location: [facility];  Service: Orthopedics;  Laterality: Left;    reports that he has been smoking.  He does not have any smokeless tobacco history on file. He reports that  drinks alcohol. He reports that he uses illicit drugs ("Crack" cocaine). family history includes Alcohol abuse in his brother, cousin, and father; Arthritis in his mother; Diabetes in his brother and sister; Goiter in his mother and sister; Heart disease in his father and other; Hyperlipidemia in his mother; Hypertension in his sister; and Stroke in his father. Allergies  Allergen Reactions  . Morphine And Related Itching  . Tramadol Itching   Current Outpatient Prescriptions on File Prior to Visit  Medication Sig Dispense Refill  . aspirin 81 MG tablet Take 81 mg by mouth daily.        [name]  bimatoprost (LUMIGAN) 0.01 % SOLN Place 1 drop into both eyes at bedtime.      . fluticasone (FLONASE) 50 MCG/ACT nasal spray Place 2 sprays into the nose daily.  16 g  5  . HYDROcodone-acetaminophen (NORCO) 10-325 MG per tablet TAKE ONE TABLET BY MOUTH EVERY 6 HOURS AS NEEDED FOR PAIN(DO NOT TAKE MORE THAN 4 TABS PER DAY)  120 tablet  2  . lisinopril-hydrochlorothiazide (PRINZIDE,ZESTORETIC) 20-25 MG per tablet Take 1 tablet by mouth daily.  30 tablet  11  .  traZODone (DESYREL) 150 MG tablet Take 1 tablet (150 mg total) by mouth at bedtime.  90 tablet  1  . promethazine (PHENERGAN) 25 MG tablet Take 1 tablet (25 mg total) by mouth every 6 (six) hours as needed for nausea.  40 tablet  1   No current facility-administered medications on file prior to visit.   Review of Systems Constitutional: Negative for diaphoresis, activity change, appetite change or unexpected weight change.  HENT: Negative for hearing loss, ear pain, facial swelling, mouth sores and neck stiffness.   Eyes: Negative for pain, redness and visual disturbance.  Respiratory: Negative for shortness of breath and wheezing.   Cardiovascular: Negative for chest pain and palpitations.  Gastrointestinal: Negative for diarrhea, blood in stool, abdominal distention or other pain Genitourinary: Negative for hematuria, flank pain or change in urine volume.  Musculoskeletal: Negative for myalgias and joint swelling.  Skin: Negative for color change and wound.  Neurological: Negative for syncope and numbness. other than noted Hematological: Negative for adenopathy.  Psychiatric/Behavioral: Negative for hallucinations, self-injury, decreased concentration and agitation.      Objective:   Physical Exam BP 120/72  Pulse 95  Temp(Src) 97.2 F (36.2 C) (Oral)  Ht 6' 1.5" (1.867 m)  Wt 148 lb (67.132 kg)  BMI 19.26 kg/m2  SpO2 96% VS noted,  Constitutional: Pt is oriented to person, place, and time. Appears well-developed and well-nourished.  Head: Normocephalic and atraumatic.  Right Ear: External ear normal.  Left Ear: External ear normal.  Nose: Nose normal.  Mouth/Throat: Oropharynx is clear and moist.  Eyes: Conjunctivae and EOM are normal. Pupils are equal, round, and reactive to light.  Neck: Normal range of motion. Neck supple. No JVD present. No tracheal deviation present.  Cardiovascular: Normal rate, regular rhythm, normal heart sounds and intact distal pulses.    Pulmonary/Chest: Effort normal and breath sounds normal.  Abdominal: Soft. Bowel sounds are normal. There is no tenderness. No HSM  Musculoskeletal: Normal range of motion. Exhibits no edema.  Lymphadenopathy:  Has no cervical adenopathy.  Neurological: Pt is alert and oriented to person, place, and time. Pt has normal reflexes. No cranial nerve deficit.  Skin: Skin is warm and dry. No rash noted.  Psychiatric:  Has  normal mood and affect. Behavior is normal.      Assessment & Plan:

## 2013-03-11 NOTE — Assessment & Plan Note (Signed)
Ok to change the robaxin to tizanidine asd

## 2013-03-12 ENCOUNTER — Encounter: Payer: Self-pay | Admitting: Internal Medicine

## 2013-03-12 LAB — URINALYSIS, ROUTINE W REFLEX MICROSCOPIC
Bilirubin Urine: NEGATIVE
Hgb urine dipstick: NEGATIVE
Nitrite: NEGATIVE
Urobilinogen, UA: 0.2 (ref 0.0–1.0)

## 2013-05-28 ENCOUNTER — Other Ambulatory Visit: Payer: Self-pay | Admitting: Internal Medicine

## 2013-06-03 ENCOUNTER — Other Ambulatory Visit: Payer: Self-pay | Admitting: *Deleted

## 2013-06-03 MED ORDER — LISINOPRIL-HYDROCHLOROTHIAZIDE 20-25 MG PO TABS: 1.0000 | ORAL_TABLET | Freq: Every day | ORAL | Status: DC

## 2013-06-15 ENCOUNTER — Other Ambulatory Visit: Payer: Self-pay

## 2013-06-15 MED ORDER — LISINOPRIL-HYDROCHLOROTHIAZIDE 20-25 MG PO TABS: 1.0000 | ORAL_TABLET | Freq: Every day | ORAL | Status: DC

## 2013-06-15 MED ORDER — TRAZODONE HCL 150 MG PO TABS: 150.0000 mg | ORAL_TABLET | Freq: Every day | ORAL | Status: DC

## 2013-06-15 NOTE — Telephone Encounter (Signed)
Done erx 

## 2013-07-20 ENCOUNTER — Ambulatory Visit
Admission: RE | Admit: 2013-07-20 | Discharge: 2013-07-20 | Disposition: A | Discharge status: Home / Self Care | Payer: Medicare Other | Source: Ambulatory Visit | Attending: Neurological Surgery | Admitting: Neurological Surgery

## 2013-07-20 ENCOUNTER — Other Ambulatory Visit: Payer: Self-pay | Admitting: Neurological Surgery

## 2013-07-20 DIAGNOSIS — M542 Cervicalgia: Secondary | ICD-10-CM

## 2013-07-27 ENCOUNTER — Other Ambulatory Visit: Payer: Self-pay | Admitting: Neurological Surgery

## 2013-08-17 ENCOUNTER — Other Ambulatory Visit (HOSPITAL_COMMUNITY): Payer: Self-pay | Admitting: *Deleted

## 2013-08-17 NOTE — Pre-Procedure Instructions (Addendum)
Mark Dunlap  08/17/2013   Your procedure is scheduled on:  Thursday, August 26, 2013 at 7:30 AM.   Report to [facility] Entrance "A" at 5:30 AM.   Call this number if you have problems the morning of surgery: [phone]   Remember:   Do not eat food or drink liquids after midnight Wednesday, 08/25/13.   Take these medicines the morning of surgery with A SIP OF WATER: cetirizine (ZYRTEC), fluticasone (FLONASE), HYDROcodone-acetaminophen (NORCO)- if needed, ondansetron (ZOFRAN) - if needed,        Do not wear jewelry.  Do not wear lotions, powders, or cologne. You may wear deodorant.             Men may shave face and neck.  Do not bring valuables to the hospital.  [facility] is not responsible                  for any belongings or valuables.               Contacts, dentures or bridgework may not be worn into surgery.  Leave suitcase in the car. After surgery it may be brought to your room.  For patients admitted to the hospital, discharge time is determined by your                treatment team.               Special Instructions: Shower using CHG 2 nights before surgery and the night before surgery.  If you shower the day of surgery use CHG.  Use special wash - you have one bottle of CHG for all showers.  You should use approximately 1/3 of the bottle for each shower.   Please read over the following fact sheets that you were given: Pain Booklet, Coughing and Deep Breathing, MRSA Information and Surgical Site Infection Prevention

## 2013-08-18 ENCOUNTER — Encounter (HOSPITAL_COMMUNITY): Payer: Self-pay

## 2013-08-18 ENCOUNTER — Encounter (HOSPITAL_COMMUNITY)
Admission: RE | Admit: 2013-08-18 | Discharge: 2013-08-18 | Disposition: A | Discharge status: Home / Self Care | Payer: Medicare Other | Source: Ambulatory Visit | Attending: Neurological Surgery | Admitting: Neurological Surgery

## 2013-08-18 DIAGNOSIS — Z01812 Encounter for preprocedural laboratory examination: Secondary | ICD-10-CM | POA: Insufficient documentation

## 2013-08-18 DIAGNOSIS — Z01818 Encounter for other preprocedural examination: Secondary | ICD-10-CM | POA: Insufficient documentation

## 2013-08-18 DIAGNOSIS — Z01810 Encounter for preprocedural cardiovascular examination: Secondary | ICD-10-CM | POA: Insufficient documentation

## 2013-08-18 LAB — BASIC METABOLIC PANEL
Chloride: 99 mEq/L (ref 96–112)
Creatinine, Ser: 1.08 mg/dL (ref 0.50–1.35)
GFR calc Af Amer: 89 mL/min — ABNORMAL LOW (ref >90)
Potassium: 3.5 mEq/L (ref 3.5–5.1)

## 2013-08-18 LAB — CBC WITH DIFFERENTIAL/PLATELET
Basophils Absolute: 0 10*3/uL (ref 0.0–0.1)
HCT: 37.1 % — ABNORMAL LOW (ref 39.0–52.0)
Hemoglobin: 12.5 g/dL — ABNORMAL LOW (ref 13.0–17.0)
Lymphocytes Relative: 20 % (ref 12–46)
MCHC: 33.7 g/dL (ref 30.0–36.0)
Monocytes Absolute: 1 10*3/uL (ref 0.1–1.0)
Monocytes Relative: 11 % (ref 3–12)
Neutro Abs: 5.6 10*3/uL (ref 1.7–7.7)
Platelets: 320 10*3/uL (ref 150–400)
RDW: 13.9 % (ref 11.5–15.5)
WBC: 8.4 10*3/uL (ref 4.0–10.5)

## 2013-08-18 LAB — SURGICAL PCR SCREEN: Staphylococcus aureus: NEGATIVE

## 2013-08-18 LAB — PROTIME-INR: INR: 0.86 (ref 0.00–1.49)

## 2013-08-19 ENCOUNTER — Encounter: Payer: Self-pay | Admitting: Nurse Practitioner

## 2013-08-19 ENCOUNTER — Ambulatory Visit (INDEPENDENT_AMBULATORY_CARE_PROVIDER_SITE_OTHER): Payer: Medicare Other | Admitting: Nurse Practitioner

## 2013-08-19 ENCOUNTER — Encounter (HOSPITAL_COMMUNITY): Payer: Self-pay | Admitting: Vascular Surgery

## 2013-08-19 VITALS — BP 110/60 | HR 93 | Temp 97.9°F | Ht 73.5 in | Wt 148.8 lb

## 2013-08-19 DIAGNOSIS — M25519 Pain in unspecified shoulder: Secondary | ICD-10-CM

## 2013-08-19 DIAGNOSIS — I10 Essential (primary) hypertension: Secondary | ICD-10-CM

## 2013-08-19 DIAGNOSIS — F17200 Nicotine dependence, unspecified, uncomplicated: Secondary | ICD-10-CM

## 2013-08-19 DIAGNOSIS — R710 Precipitous drop in hematocrit: Secondary | ICD-10-CM

## 2013-08-19 DIAGNOSIS — R0982 Postnasal drip: Secondary | ICD-10-CM

## 2013-08-19 DIAGNOSIS — J329 Chronic sinusitis, unspecified: Secondary | ICD-10-CM

## 2013-08-19 DIAGNOSIS — D649 Anemia, unspecified: Secondary | ICD-10-CM

## 2013-08-19 DIAGNOSIS — M25512 Pain in left shoulder: Secondary | ICD-10-CM

## 2013-08-19 NOTE — Progress Notes (Signed)
Pre-visit discussion using our clinic review tool. No additional management support is needed unless otherwise documented below in the visit note.  

## 2013-08-19 NOTE — Progress Notes (Addendum)
Anesthesia Chart Review:  Patient is a 51 year old male scheduled for C2-7 posterior cervical fusion with lateral mass fixation, left C6-7 foraminotomy on 08/26/13 by Dr. Yetta Barre.    History includes smoking, HTN, glaucoma and is blind in left eye, history of right thyroid nodule, hypothyroidism in 2012 now resolved, , adjustment disorder, substance abuse including ETOH/cocaine (none since 08/2007), DJD, prostatitis, left THA '13, C3-6 ACDF '12. PCP is Dr. Oliver Barre.  Patient was seen by Maximino Sarin, NP earlier today for a annual medicare wellness visit.  EKG on 08/18/13 showed NSR, T wave abnormality, consider inferoior ischemia. More non-specific ST/T wave abnormality in the inferior leads.  These appear new when compared to his prior EKG on 12/06/11.  CXR on 08/18/13 showed:  1. Chronic pleural fluid, scarring and volume loss in the right hemi thorax.  2. No acute cardiopulmonary findings.   Preoperative labs noted.  I reviewed with anesthesiologist Dr. Michelle Piper.  Patient will need medical clearance.  Will defer decision for cardiac testing or referral to Dr. Jonny Ruiz or Alben Spittle, NP. I will follow-up with Dr. Lindalou Hose office tomorrow since they are now closed.  Velna Ochs [facility]Anesthesiology Phone [phone] 08/19/2013 6:30 PM  Addendum: 08/20/2013 10:15 AM Erie Noe at Dr. Yetta Barre office notified that patient will need medical clearance. She will contact Dr. Raphael Gibney office.  I have routed patient's EKG to Dr. Jonny Ruiz and Alben Spittle, NP for review. Will await clearance/recommendations.

## 2013-08-19 NOTE — Progress Notes (Signed)
Subjective:    Patient here for follow-up of multiple issues: HTN, desires smoking cessation, chronic pain of neck & shoulder. Regarding HTN: He is exercising and is adherent to a low-salt diet.  Blood pressure is well controlled at home. Cardiac symptoms: none. Patient denies: chest pain, chest pressure/discomfort, exertional chest pressure/discomfort, fatigue, irregular heart beat, lower extremity edema and syncope. Cardiovascular risk factors: dyslipidemia, hypertension, male gender and smoking/ tobacco exposure. Use of agents associated with hypertension: none. History of target organ damage: GFR 70.  The following portions of the patient's history were reviewed and updated as appropriate: allergies, current medications, past medical history, past surgical history and problem list.  Review of Systems Constitutional: negative for fevers, night sweats and weight loss Eyes: treated for glaucoma, last eye exam May, 14. Next is scheduled for 12/14. Pt reports no changes at last exam. Ears, nose, mouth, throat, and face: c/o post-nasal drip-worse when lies down Respiratory: positive for sputum, negative for dyspnea on exertion, pleurisy/chest pain and wheezing Cardiovascular: negative for chest pain, chest pressure/discomfort, irregular heart beat, lower extremity edema and near-syncope Integument/breast: positive for lip discoloration & light spots on legs. Pt has seen dermatologist. Musculoskeletal:positive for chronic neck pain, h/o cs surgery, 2nd surgery planned in few weeks.     Objective:    BP 110/60  Pulse 93  Temp(Src) 97.9 F (36.6 C) (Oral)  Ht 6' 1.5" (1.867 m)  Wt 148 lb 12 oz (67.473 kg)  BMI 19.36 kg/m2  SpO2 99% General appearance: alert, cooperative, appears stated age, mild distress and pt expressing frustration w/multiple medical appointments recently. Head: Normocephalic, without obvious abnormality, atraumatic Eyes: negative findings: lids and lashes normal, positive  findings: sclera injected bilat and disconjugate gaze Back: loss of cervical curvature, R shoulder & scapula lower than left Lungs: diminished breath sounds bibasilar and bilaterally Heart: regular rate and rhythm, S1, S2 normal, no murmur, click, rub or gallop Extremities: extremities normal, atraumatic, no cyanosis or edema Skin: lower lip (30%) & upper thighs (3-4 small macular lesions) discoloration-loss of pigment    Assessment:    Hypertension, wel controlled on lisinopril/HCTZ 20/25. Evidence of target organ damage: GFR 70.  Smoker, motivated to quit Elevated total cholesterol: 10 yr risk for hard ASCVD is 11.33%. If he stops smoking, risk is decreased to 6.7%.  Shoulder pain, loss of cs curvature, scoliosis-R shoulder lower than L. 2nd surgery planned in few weeks Post-nasal drip & productive cough. Clear breath sounds, diminshed bases, bilat. Decreased Hgb. Colonoscopy 2011. H/o low hgb due to surgical blood loss.    Plan:  1  Regular aerobic exercise. Follow up: 6 months and as needed. Continue current med.  2 pt will try e-cig. If unsuccessful, will call to discuss meds for smoking cessation. 3 Per new ACC/AHA guideline (statin for people w/risk >7.5%) -pt could take statin if he continues to smoke (10 yr risk is 11.33%, If he stops, risk is decreased to 6.7% ). Re-eval in 6 mos. 4 Start daily sinus rinse. Use mucinex. Hydrate. 5 ifob to r/o blood loss from gut.

## 2013-08-19 NOTE — Patient Instructions (Addendum)
For post nasal drip, use sinus rinse daily (Neilmed Sinus rinse). Use mucinex to help loosen congestion. Sip fluids every hour to keep secretions thin. Consider massage & physical therapy to help manage pain in neck & shoulders. Stretching and strengthening exercises can help with posture, which can help decrease pain. Continue with healthy diet, take a walk every day for 30 minutes for the health benefits including lower blood pressure, flexibility, & improvement in depression. Stop smoking as your risk for heart disease is decreased by 5% if you are a non-smoker. Use vapor cigarettes to smoke fewer cigarettes. Let us know if you are not successful. Medications may help you quit. Nice to meet you.  Shoulder Exercises EXERCISES  RANGE OF MOTION (ROM) AND STRETCHING EXERCISES These exercises may help you when beginning to rehabilitate your injury. Your symptoms may resolve with or without further involvement from your physician, physical therapist or athletic trainer. While completing these exercises, remember:   Restoring tissue flexibility helps normal motion to return to the joints. This allows healthier, less painful movement and activity.  An effective stretch should be held for at least 30 seconds.  A stretch should never be painful. You should only feel a gentle lengthening or release in the stretched tissue. ROM - Pendulum  Bend at the waist so that your right / left arm falls away from your body. Support yourself with your opposite hand on a solid surface, such as a table or a countertop.  Your right / left arm should be perpendicular to the ground. If it is not perpendicular, you need to lean over farther. Relax the muscles in your right / left arm and shoulder as much as possible.  Gently sway your hips and trunk so they move your right / left arm without any use of your right / left shoulder muscles.  Progress your movements so that your right / left arm moves side to side, then  forward and backward, and finally, both clockwise and counterclockwise.  Complete ____5______ repetitions in each direction. Many people use this exercise to relieve discomfort in their shoulder as well as to gain range of motion. Repeat _____5_____ times. Complete this exercise _1_________ times per day. STRETCH  Flexion, Standing  Stand with good posture. With an underhand grip on your right / left hand and an overhand grip on the opposite hand, grasp a broomstick or cane so that your hands are a little more than shoulder-width apart.  Keeping your right / left elbow straight and shoulder muscles relaxed, push the stick with your opposite hand to raise your right / left arm in front of your body and then overhead. Raise your arm until you feel a stretch in your right / left shoulder, but before you have increased shoulder pain.  Try to avoid shrugging your right / left shoulder as your arm rises by keeping your shoulder blade tucked down and toward your mid-back spine. Hold __________ seconds.  Slowly return to the starting position. Repeat ____5______ times. Complete this exercise ___1_______ times per day. STRETCH - Internal Rotation  Place your right / left hand behind your back, palm-up.  Throw a towel or belt over your opposite shoulder. Grasp the towel/belt with your right / left hand.  While keeping an upright posture, gently pull up on the towel/belt until you feel a stretch in the front of your right / left shoulder.  Avoid shrugging your right / left shoulder as your arm rises by keeping your shoulder blade tucked down  and toward your mid-back spine.  Hold ____10______. Release the stretch by lowering your opposite hand. Repeat _____5_____ times. Complete this exercise ___1_______ times per day. STRETCH - External Rotation and Abduction  Stagger your stance through a doorframe. It does not matter which foot is forward.  As instructed by your physician, physical therapist or  athletic trainer, place your hands:  And forearms above your head and on the door frame.  And forearms at head-height and on the door frame.  At elbow-height and on the door frame.  Keeping your head and chest upright and your stomach muscles tight to prevent over-extending your low-back, slowly shift your weight onto your front foot until you feel a stretch across your chest and/or in the front of your shoulders.  Hold ______10____ seconds. Shift your weight to your back foot to release the stretch. Repeat _____5_____ times. Complete this stretch ___1_______ times per day.  STRENGTHENING EXERCISES  These exercises may help you when beginning to rehabilitate your injury. They may resolve your symptoms with or without further involvement from your physician, physical therapist or athletic trainer. While completing these exercises, remember:   Muscles can gain both the endurance and the strength needed for everyday activities through controlled exercises.  Complete these exercises as instructed by your physician, physical therapist or athletic trainer. Progress the resistance and repetitions only as guided.  You may experience muscle soreness or fatigue, but the pain or discomfort you are trying to eliminate should never worsen during these exercises. If this pain does worsen, stop and make certain you are following the directions exactly. If the pain is still present after adjustments, discontinue the exercise until you can discuss the trouble with your clinician.  If advised by your physician, during your recovery, avoid activity or exercises which involve actions that place your right / left hand or elbow above your head or behind your back or head. These positions stress the tissues which are trying to heal. STRENGTH - Scapular Depression and Adduction  With good posture, sit on a firm chair. Supported your arms in front of you with pillows, arm rests or a table top. Have your elbows in  line with the sides of your body.  Gently draw your shoulder blades down and toward your mid-back spine. Gradually increase the tension without tensing the muscles along the top of your shoulders and the back of your neck.  Hold for _____10_____ seconds. Slowly release the tension and relax your muscles completely before completing the next repetition.  After you have practiced this exercise, remove the arm support and complete it in standing as well as sitting. Repeat ___5_____ times. Complete this exercise ____1______ times per day.  STRENGTH - External Rotators  Secure a rubber exercise band/tubing to a fixed object so that it is at the same height as your right / left elbow when you are standing or sitting on a firm surface.  Stand or sit so that the secured exercise band/tubing is at your side that is not injured.  Bend your elbow 90 degrees. Place a folded towel or small pillow under your right / left arm so that your elbow is a few inches away from your side.  Keeping the tension on the exercise band/tubing, pull it away from your body, as if pivoting on your elbow. Be sure to keep your body steady so that the movement is only coming from your shoulder rotating.  Hold __________ seconds. Release the tension in a controlled manner as you return  to the starting position. Repeat __________ times. Complete this exercise __________ times per day.  STRENGTH - Supraspinatus  Stand or sit with good posture. Grasp a __________ weight or an exercise band/tubing so that your hand is "thumbs-up," like when you shake hands.  Slowly lift your right / left hand from your thigh into the air, traveling about 30 degrees from straight out at your side. Lift your hand to shoulder height or as far as you can without increasing any shoulder pain. Initially, many people do not lift their hands above shoulder height.  Avoid shrugging your right / left shoulder as your arm rises by keeping your shoulder  blade tucked down and toward your mid-back spine.  Hold for ___10_______ seconds. Control the descent of your hand as you slowly return to your starting position. Repeat _______5___ times. Complete this exercise ___1_______ times per day.  STRENGTH - Shoulder Extensors  Secure a rubber exercise band/tubing so that it is at the height of your shoulders when you are either standing or sitting on a firm arm-less chair.  With a thumbs-up grip, grasp an end of the band/tubing in each hand. Straighten your elbows and lift your hands straight in front of you at shoulder height. Step back away from the secured end of band/tubing until it becomes tense.  Squeezing your shoulder blades together, pull your hands down to the sides of your thighs. Do not allow your hands to go behind you.  Hold for _____10_____ seconds. Slowly ease the tension on the band/tubing as you reverse the directions and return to the starting position. Repeat ______5____ times. Complete this exercise ___1_______ times per day.  STRENGTH - Scapular Retractors  Secure a rubber exercise band/tubing so that it is at the height of your shoulders when you are either standing or sitting on a firm arm-less chair.  With a palm-down grip, grasp an end of the band/tubing in each hand. Straighten your elbows and lift your hands straight in front of you at shoulder height. Step back away from the secured end of band/tubing until it becomes tense.  Squeezing your shoulder blades together, draw your elbows back as you bend them. Keep your upper arm lifted away from your body throughout the exercise.  Hold ___10_______ seconds. Slowly ease the tension on the band/tubing as you reverse the directions and return to the starting position. Repeat _______5___ times. Complete this exercise ___1_______ times per day. STRENGTH  Scapular Depressors  Find a sturdy chair without wheels, such as a from a dining room table.  Keeping your feet on the  floor, lift your bottom from the seat and lock your elbows.  Keeping your elbows straight, allow gravity to pull your body weight down. Your shoulders will rise toward your ears.  Raise your body against gravity by drawing your shoulder blades down your back, shortening the distance between your shoulders and ears. Although your feet should always maintain contact with the floor, your feet should progressively support less body weight as you get stronger.  Hold __10________ seconds. In a controlled and slow manner, lower your body weight to begin the next repetition. Repeat ______5____ times. Complete this exercise __1________ times per day.  Document Released: 08/14/2005 Document Revised: 12/23/2011 Document Reviewed: 01/12/2009 [facility] Patient Information 2014 [place], [place].

## 2013-08-20 ENCOUNTER — Telehealth: Payer: Self-pay | Admitting: Internal Medicine

## 2013-08-20 ENCOUNTER — Encounter: Payer: Self-pay | Admitting: Internal Medicine

## 2013-08-20 ENCOUNTER — Other Ambulatory Visit: Payer: Self-pay | Admitting: Internal Medicine

## 2013-08-20 DIAGNOSIS — I10 Essential (primary) hypertension: Secondary | ICD-10-CM

## 2013-08-20 DIAGNOSIS — E785 Hyperlipidemia, unspecified: Secondary | ICD-10-CM

## 2013-08-20 DIAGNOSIS — R9431 Abnormal electrocardiogram [ECG] [EKG]: Secondary | ICD-10-CM

## 2013-08-20 HISTORY — DX: Hyperlipidemia, unspecified: E78.5

## 2013-08-20 NOTE — Telephone Encounter (Signed)
[facility] informed of MD instructions and informed the patient as well.  Faxed information to [facility] Attn: Erie Noe [phone]).

## 2013-08-20 NOTE — Telephone Encounter (Signed)
Response to below:  Have reviewed ECG as below and agree pt with new inferolat T wave abnormality in the setting of chronic pain requiring pain control, difficult ambulation, HTN, hyperlipidemia.  Agree will need card eval with non ambulatory stress testing prior to proposed surgury.  Will order, Dr Ethelene Browns office to be notified per Zella Ball . Cont same meds for now.  Zella Ball - to inform Dr Dutch Quint office, and patient  I saw this pt yesterday. I am forwarding the info. Regarding pre-op work. Please see below. Thanks, Layne ----- Message ----- From: Jerold Coombe, PA-C Sent: 08/19/2013 7:29 PM To: Kelle Darting, NP Maximino Sarin, I am the physician assistant who works with anesthesia/preoperative testing at [facility]. I am forwarding you the 08/18/13 EKG on Mark Dunlap DOB [id number]. He is scheduled for C2-7 posterior cervical fusion by Dr. Jordan Likes on 08/26/13. His EKG appears to have new inferior lateral T wave abnormality since 12/06/11. Due to the EKG changes, anesthesiologist Dr. Michelle Piper is requesting medical clearance--with plan to let you/Dr. Jonny Ruiz determine if further cardiac testing/referral is needed preoperatively. Dr. Lindalou Hose office is now closed and I'm not in after after 10AM tomorrow, but I wanted to give a heads up that we would be having their office contact you for clearance recommendations. Velna O[facility] Short Stay Center/Anesthesiology Phone [phone] 08/19/2013 7:29 PM   Dr. Jonny Ruiz, FYI: Kallen Mccrystal is scheduled for C2-7 posterior fusion on 08/26/13 by Dr. Marikay Alar. His office will be calling/faxing a request for medical clearance as requested by anesthesiologist Dr. Michelle Piper. He was just seen by Maximino Sarin, NP on 08/19/13 so I sent Layne a message as well. Recent labs, CXR, EKG are in Epic. Velna O[facility] Short Stay Center/Anesthesiology Phone [phone] 08/20/2013 10:25 AM

## 2013-08-24 ENCOUNTER — Telehealth: Payer: Self-pay | Admitting: Internal Medicine

## 2013-08-24 NOTE — Telephone Encounter (Signed)
Called the patient and he had already spoken to [facility] regarding scheduling of his stress test.

## 2013-08-24 NOTE — Telephone Encounter (Signed)
08/24/2013  Pt returned call.  Wants to speak with Robin E.

## 2013-08-25 ENCOUNTER — Other Ambulatory Visit: Payer: Medicare Other

## 2013-08-25 ENCOUNTER — Other Ambulatory Visit: Payer: Self-pay | Admitting: Nurse Practitioner

## 2013-08-25 DIAGNOSIS — D649 Anemia, unspecified: Secondary | ICD-10-CM

## 2013-08-25 LAB — FECAL OCCULT BLOOD, IMMUNOCHEMICAL: Fecal Occult Bld: NEGATIVE

## 2013-08-26 ENCOUNTER — Inpatient Hospital Stay (HOSPITAL_COMMUNITY): Admission: RE | Admit: 2013-08-26 | Payer: Medicare Other | Source: Ambulatory Visit | Admitting: Neurological Surgery

## 2013-08-26 ENCOUNTER — Encounter (HOSPITAL_COMMUNITY): Admission: RE | Payer: Self-pay | Source: Ambulatory Visit

## 2013-08-26 SURGERY — POSTERIOR CERVICAL FUSION/FORAMINOTOMY LEVEL 4
Anesthesia: General

## 2013-08-30 ENCOUNTER — Ambulatory Visit (HOSPITAL_COMMUNITY): Payer: Medicare Other | Attending: Internal Medicine | Admitting: Radiology

## 2013-08-30 VITALS — BP 110/69 | HR 68 | Ht 73.5 in | Wt 150.0 lb

## 2013-08-30 DIAGNOSIS — R9431 Abnormal electrocardiogram [ECG] [EKG]: Secondary | ICD-10-CM | POA: Insufficient documentation

## 2013-08-30 DIAGNOSIS — Z01810 Encounter for preprocedural cardiovascular examination: Secondary | ICD-10-CM

## 2013-08-30 DIAGNOSIS — E785 Hyperlipidemia, unspecified: Secondary | ICD-10-CM | POA: Insufficient documentation

## 2013-08-30 DIAGNOSIS — I10 Essential (primary) hypertension: Secondary | ICD-10-CM | POA: Insufficient documentation

## 2013-08-30 MED ORDER — TECHNETIUM TC 99M SESTAMIBI GENERIC - CARDIOLITE
30.0000 | Freq: Once | INTRAVENOUS | Status: AC | PRN
  Administered 2013-08-30: 30 via INTRAVENOUS

## 2013-08-30 MED ORDER — TECHNETIUM TC 99M SESTAMIBI GENERIC - CARDIOLITE
10.0000 | Freq: Once | INTRAVENOUS | Status: AC | PRN
  Administered 2013-08-30: 10 via INTRAVENOUS

## 2013-08-30 MED ORDER — REGADENOSON 0.4 MG/5ML IV SOLN
0.4000 mg | Freq: Once | INTRAVENOUS | Status: AC
  Administered 2013-08-30: 0.4 mg via INTRAVENOUS

## 2013-08-30 NOTE — Progress Notes (Signed)
[name] SITE 3 NUCLEAR MED [address] [place], [place] [address] [phone]    Cardiology Nuclear Med Study  Mark Dunlap is a 52 y.o. male     MRN : [id number]     DOB: 15-May-1961  Procedure Date: 08/30/2013  Nuclear Med Background Indication for Stress Test:  Evaluation for Ischemia, Surgical Clearance-Spinal surgery- Dr. Julio Sicks and Abnormal EKG History:  No cardiac history Cardiac Risk Factors: Hypertension and Lipids  Symptoms:  non indicated   Nuclear Pre-Procedure Caffeine/Decaff Intake:  None NPO After: 7:00pm   Lungs:  clear O2 Sat: 98% on room air. IV 0.9% NS with Angio Cath:  22g  IV Site: R Antecubital  IV Started by:  Bonnita Levan, RN  Chest Size (in):  38 Cup Size: n/a  Height: 6' 1.5" (1.867 m)  Weight:  150 lb (68.04 kg)  BMI:  Body mass index is 19.52 kg/(m^2). Tech Comments:  N/A    Nuclear Med Study 1 or 2 day study: 1 day  Stress Test Type:  Lexiscan  Reading MD: Tobias Alexander, MD  Order Authorizing Provider:  Oliver Barre, MD  Resting Radionuclide: Technetium 70m Sestamibi  Resting Radionuclide Dose: 11.0 mCi   Stress Radionuclide:  Technetium 38m Sestamibi  Stress Radionuclide Dose: 33.0 mCi           Stress Protocol Rest HR: 68 Stress HR: 107  Rest BP: 110/69 Stress BP: 112/57  Exercise Time (min): n/a METS: n/a   Predicted Max HR: 167 bpm % Max HR: 64.07 bpm Rate Pressure Product: 40102   Dose of Adenosine (mg):  n/a Dose of Lexiscan: 0.4 mg  Dose of Atropine (mg): n/a Dose of Dobutamine: n/a mcg/kg/min (at max HR)  Stress Test Technologist: Cash Duce Chimes, BS-ES  Nuclear Technologist:  Domenic Polite, CNMT     Rest Procedure:  Myocardial perfusion imaging was performed at rest 45 minutes following the intravenous administration of Technetium 47m Sestamibi. Rest ECG: NSR - Normal EKG  Stress Procedure:  The patient received IV Lexiscan 0.4 mg over 15-seconds.  Technetium 20m Sestamibi injected at 30-seconds.   Quantitative spect images were obtained after a 45 minute delay.  During the infusion of Lexiscan, patient complained of SOB and a funny sensation in his stomach.  These symptoms began to resolve in recovery.  Stress ECG: No significant change from baseline ECG  QPS Raw Data Images:  Normal; no motion artifact; normal heart/lung ratio. Stress Images:  Normal homogeneous uptake in all areas of the myocardium. Rest Images:  Normal homogeneous uptake in all areas of the myocardium. Subtraction (SDS):  No evidence of ischemia. Transient Ischemic Dilatation (Normal <1.22):  0.96 Lung/Heart Ratio (Normal <0.45):  0.23  Quantitative Gated Spect Images QGS EDV:  103 ml QGS ESV:  51 ml  Impression Exercise Capacity:  Lexiscan with no exercise. BP Response:  Normal blood pressure response. Clinical Symptoms:  No significant symptoms noted. ECG Impression:  No significant ST segment change suggestive of ischemia. Comparison with Prior Nuclear Study: No images to compare  Overall Impression:  Normal stress nuclear study.  LV Ejection Fraction: 50%.  LV Wall Motion:  NL LV Function; NL Wall Motion  Tobias Alexander, Rexene Edison 08/30/2013

## 2013-09-06 ENCOUNTER — Other Ambulatory Visit: Payer: Self-pay | Admitting: Neurological Surgery

## 2013-09-17 ENCOUNTER — Encounter (HOSPITAL_COMMUNITY): Payer: Self-pay | Admitting: Pharmacy Technician

## 2013-09-20 NOTE — Pre-Procedure Instructions (Addendum)
Mark Dunlap  09/20/2013   Your procedure is scheduled on:  Thursday, December 11 th.  Report to [facility], Main Entrance or Entrance "A"11:55 AM.  Call this number if you have problems the morning of surgery: [phone]   Remember:   Do not eat food or drink liquids after midnight.   Take these medicines the morning of surgery with A SIP OF WATER: Zyrtec. May use nasal spray and eye drops.   Do not wear jewelry, make-up or nail polish.  Do not wear lotions, powders, or perfumes. You may wear deodorant.   Men may shave face and neck.  Do not bring valuables to the hospital.  [facility] is not responsible for any belongings or valuables.               Contacts, dentures or bridgework may not be worn into surgery.  Leave suitcase in the car. After surgery it may be brought to your room.  For patients admitted to the hospital, discharge time is determined by your treatment team.                 Special Instructions: Shower using CHG 2 nights before surgery and the night before surgery.  If you shower the day of surgery use CHG.  Use special wash - you have one bottle of CHG for all showers.  You should use approximately 1/3 of the bottle for each shower.   Please read over the following fact sheets that you were given: Pain Booklet, Coughing and Deep Breathing and Surgical Site Infection Prevention

## 2013-09-21 ENCOUNTER — Encounter (HOSPITAL_COMMUNITY)
Admission: RE | Admit: 2013-09-21 | Discharge: 2013-09-21 | Disposition: A | Discharge status: Home / Self Care | Payer: Medicare Other | Source: Ambulatory Visit | Attending: Neurological Surgery | Admitting: Neurological Surgery

## 2013-09-21 ENCOUNTER — Encounter (HOSPITAL_COMMUNITY): Payer: Self-pay

## 2013-09-21 HISTORY — DX: Other seasonal allergic rhinitis: J30.2

## 2013-09-21 HISTORY — DX: Legal blindness, as defined in USA: H54.8

## 2013-09-21 LAB — CBC WITH DIFFERENTIAL/PLATELET
Basophils Relative: 1 % (ref 0–1)
Eosinophils Absolute: 0.2 10*3/uL (ref 0.0–0.7)
Eosinophils Relative: 2 % (ref 0–5)
Hemoglobin: 14.1 g/dL (ref 13.0–17.0)
Lymphocytes Relative: 25 % (ref 12–46)
Lymphs Abs: 1.7 10*3/uL (ref 0.7–4.0)
MCH: 31.7 pg (ref 26.0–34.0)
MCHC: 33.8 g/dL (ref 30.0–36.0)
MCV: 93.7 fL (ref 78.0–100.0)
Monocytes Relative: 10 % (ref 3–12)
Neutrophils Relative %: 62 % (ref 43–77)
RBC: 4.45 MIL/uL (ref 4.22–5.81)
WBC: 6.6 10*3/uL (ref 4.0–10.5)

## 2013-09-21 LAB — BASIC METABOLIC PANEL
BUN: 11 mg/dL (ref 6–23)
CO2: 29 mEq/L (ref 19–32)
Calcium: 9.8 mg/dL (ref 8.4–10.5)
Chloride: 99 mEq/L (ref 96–112)
GFR calc Af Amer: >90 mL/min (ref >90)
GFR calc non Af Amer: 79 mL/min — ABNORMAL LOW (ref >90)
Glucose, Bld: 79 mg/dL (ref 70–99)
Potassium: 4 mEq/L (ref 3.5–5.1)
Sodium: 137 mEq/L (ref 135–145)

## 2013-09-21 LAB — SURGICAL PCR SCREEN: MRSA, PCR: NEGATIVE

## 2013-09-21 LAB — PROTIME-INR: INR: 0.93 (ref 0.00–1.49)

## 2013-09-22 MED ORDER — CEFAZOLIN SODIUM-DEXTROSE 2-3 GM-% IV SOLR: 2.0000 g | INTRAVENOUS | Status: DC

## 2013-09-22 NOTE — Progress Notes (Signed)
Spoke with patient's mother, gave instructions for patient to arrive at 900 am 09/23/13.

## 2013-09-23 ENCOUNTER — Inpatient Hospital Stay (HOSPITAL_COMMUNITY)
Admission: RE | Admit: 2013-09-23 | Discharge: 2013-09-25 | DRG: 472 | Disposition: A | Discharge status: Home / Self Care | Payer: Medicare Other | Source: Ambulatory Visit | Attending: Neurological Surgery | Admitting: Neurological Surgery

## 2013-09-23 ENCOUNTER — Encounter (HOSPITAL_COMMUNITY): Payer: Medicare Other | Admitting: Anesthesiology

## 2013-09-23 ENCOUNTER — Inpatient Hospital Stay (HOSPITAL_COMMUNITY): Payer: Medicare Other | Admitting: Anesthesiology

## 2013-09-23 ENCOUNTER — Inpatient Hospital Stay (HOSPITAL_COMMUNITY): Payer: Medicare Other

## 2013-09-23 ENCOUNTER — Encounter (HOSPITAL_COMMUNITY): Payer: Self-pay | Admitting: Anesthesiology

## 2013-09-23 ENCOUNTER — Encounter (HOSPITAL_COMMUNITY)
Admission: RE | Disposition: A | Discharge status: Home / Self Care | Payer: Self-pay | Source: Ambulatory Visit | Attending: Neurological Surgery

## 2013-09-23 DIAGNOSIS — Z833 Family history of diabetes mellitus: Secondary | ICD-10-CM

## 2013-09-23 DIAGNOSIS — M169 Osteoarthritis of hip, unspecified: Secondary | ICD-10-CM | POA: Diagnosis present

## 2013-09-23 DIAGNOSIS — Z888 Allergy status to other drugs, medicaments and biological substances status: Secondary | ICD-10-CM

## 2013-09-23 DIAGNOSIS — E785 Hyperlipidemia, unspecified: Secondary | ICD-10-CM | POA: Diagnosis present

## 2013-09-23 DIAGNOSIS — T84498A Other mechanical complication of other internal orthopedic devices, implants and grafts, initial encounter: Principal | ICD-10-CM | POA: Diagnosis present

## 2013-09-23 DIAGNOSIS — M4712 Other spondylosis with myelopathy, cervical region: Secondary | ICD-10-CM | POA: Diagnosis present

## 2013-09-23 DIAGNOSIS — Z6379 Other stressful life events affecting family and household: Secondary | ICD-10-CM

## 2013-09-23 DIAGNOSIS — Z7982 Long term (current) use of aspirin: Secondary | ICD-10-CM

## 2013-09-23 DIAGNOSIS — Z96649 Presence of unspecified artificial hip joint: Secondary | ICD-10-CM

## 2013-09-23 DIAGNOSIS — Z01812 Encounter for preprocedural laboratory examination: Secondary | ICD-10-CM

## 2013-09-23 DIAGNOSIS — Z79899 Other long term (current) drug therapy: Secondary | ICD-10-CM

## 2013-09-23 DIAGNOSIS — Z8261 Family history of arthritis: Secondary | ICD-10-CM

## 2013-09-23 DIAGNOSIS — M1610 Unilateral primary osteoarthritis, unspecified hip: Secondary | ICD-10-CM | POA: Diagnosis present

## 2013-09-23 DIAGNOSIS — Y831 Surgical operation with implant of artificial internal device as the cause of abnormal reaction of the patient, or of later complication, without mention of misadventure at the time of the procedure: Secondary | ICD-10-CM | POA: Diagnosis present

## 2013-09-23 DIAGNOSIS — M1710 Unilateral primary osteoarthritis, unspecified knee: Secondary | ICD-10-CM | POA: Diagnosis present

## 2013-09-23 DIAGNOSIS — H547 Unspecified visual loss: Secondary | ICD-10-CM | POA: Diagnosis present

## 2013-09-23 DIAGNOSIS — F17200 Nicotine dependence, unspecified, uncomplicated: Secondary | ICD-10-CM | POA: Diagnosis present

## 2013-09-23 DIAGNOSIS — I10 Essential (primary) hypertension: Secondary | ICD-10-CM | POA: Diagnosis present

## 2013-09-23 DIAGNOSIS — Z823 Family history of stroke: Secondary | ICD-10-CM

## 2013-09-23 DIAGNOSIS — H409 Unspecified glaucoma: Secondary | ICD-10-CM | POA: Diagnosis present

## 2013-09-23 DIAGNOSIS — Z981 Arthrodesis status: Secondary | ICD-10-CM

## 2013-09-23 DIAGNOSIS — M5000 Cervical disc disorder with myelopathy, unspecified cervical region: Secondary | ICD-10-CM | POA: Diagnosis present

## 2013-09-23 DIAGNOSIS — Z8249 Family history of ischemic heart disease and other diseases of the circulatory system: Secondary | ICD-10-CM

## 2013-09-23 HISTORY — PX: POSTERIOR CERVICAL FUSION/FORAMINOTOMY: SHX5038

## 2013-09-23 SURGERY — POSTERIOR CERVICAL FUSION/FORAMINOTOMY LEVEL 5
Anesthesia: General

## 2013-09-23 MED ORDER — ASPIRIN EC 81 MG PO TBEC
81.0000 mg | DELAYED_RELEASE_TABLET | Freq: Every day | ORAL | Status: DC
  Administered 2013-09-23 – 2013-09-24 (×2): 81 mg via ORAL
  Filled 2013-09-23 (×3): qty 1

## 2013-09-23 MED ORDER — FENTANYL CITRATE 0.05 MG/ML IJ SOLN
INTRAMUSCULAR | Status: AC
  Filled 2013-09-23: qty 2

## 2013-09-23 MED ORDER — OXYCODONE-ACETAMINOPHEN 5-325 MG PO TABS
1.0000 | ORAL_TABLET | ORAL | Status: DC | PRN
  Administered 2013-09-23 – 2013-09-25 (×8): 2 via ORAL
  Filled 2013-09-23 (×7): qty 2

## 2013-09-23 MED ORDER — PHENOL 1.4 % MT LIQD: 1.0000 | OROMUCOSAL | Status: DC | PRN

## 2013-09-23 MED ORDER — HYDROMORPHONE HCL PF 1 MG/ML IJ SOLN
0.5000 mg | INTRAMUSCULAR | Status: DC | PRN
  Administered 2013-09-23 – 2013-09-24 (×3): 1 mg via INTRAVENOUS
  Filled 2013-09-23 (×3): qty 1

## 2013-09-23 MED ORDER — METHOCARBAMOL 500 MG PO TABS
500.0000 mg | ORAL_TABLET | Freq: Four times a day (QID) | ORAL | Status: DC | PRN
  Administered 2013-09-23 – 2013-09-24 (×3): 500 mg via ORAL
  Filled 2013-09-23 (×3): qty 1

## 2013-09-23 MED ORDER — ASPIRIN 81 MG PO TABS: 81.0000 mg | ORAL_TABLET | Freq: Every day | ORAL | Status: DC

## 2013-09-23 MED ORDER — LIDOCAINE HCL (CARDIAC) 20 MG/ML IV SOLN
INTRAVENOUS | Status: DC | PRN
  Administered 2013-09-23: 75 mg via INTRAVENOUS

## 2013-09-23 MED ORDER — FENTANYL CITRATE 0.05 MG/ML IJ SOLN
25.0000 ug | INTRAMUSCULAR | Status: DC | PRN
  Administered 2013-09-23 (×3): 50 ug via INTRAVENOUS

## 2013-09-23 MED ORDER — ONDANSETRON HCL 4 MG PO TABS: 4.0000 mg | ORAL_TABLET | Freq: Three times a day (TID) | ORAL | Status: DC | PRN

## 2013-09-23 MED ORDER — CELECOXIB 200 MG PO CAPS
200.0000 mg | ORAL_CAPSULE | Freq: Two times a day (BID) | ORAL | Status: DC
  Administered 2013-09-23 – 2013-09-25 (×4): 200 mg via ORAL
  Filled 2013-09-23 (×6): qty 1

## 2013-09-23 MED ORDER — BIMATOPROST 0.01 % OP SOLN
1.0000 [drp] | Freq: Every day | OPHTHALMIC | Status: DC
  Administered 2013-09-23 – 2013-09-24 (×2): 1 [drp] via OPHTHALMIC
  Filled 2013-09-23: qty 2.5

## 2013-09-23 MED ORDER — DEXAMETHASONE SODIUM PHOSPHATE 10 MG/ML IJ SOLN
10.0000 mg | INTRAMUSCULAR | Status: AC
  Administered 2013-09-23: 10 mg via INTRAVENOUS
  Filled 2013-09-23: qty 1

## 2013-09-23 MED ORDER — FENTANYL CITRATE 0.05 MG/ML IJ SOLN: 25.0000 ug | INTRAMUSCULAR | Status: DC | PRN

## 2013-09-23 MED ORDER — PROPOFOL 10 MG/ML IV BOLUS
INTRAVENOUS | Status: DC | PRN
  Administered 2013-09-23: 10 mg via INTRAVENOUS
  Administered 2013-09-23: 50 mg via INTRAVENOUS

## 2013-09-23 MED ORDER — HYDROCHLOROTHIAZIDE 25 MG PO TABS
25.0000 mg | ORAL_TABLET | Freq: Every day | ORAL | Status: DC
  Administered 2013-09-24: 25 mg via ORAL
  Filled 2013-09-23 (×2): qty 1

## 2013-09-23 MED ORDER — CEFAZOLIN SODIUM 1-5 GM-% IV SOLN
1.0000 g | Freq: Three times a day (TID) | INTRAVENOUS | Status: AC
  Administered 2013-09-23 – 2013-09-24 (×2): 1 g via INTRAVENOUS
  Filled 2013-09-23 (×2): qty 50

## 2013-09-23 MED ORDER — BUPIVACAINE HCL (PF) 0.25 % IJ SOLN
INTRAMUSCULAR | Status: DC | PRN
  Administered 2013-09-23: 9 mL

## 2013-09-23 MED ORDER — THROMBIN 5000 UNITS EX SOLR
OROMUCOSAL | Status: DC | PRN
  Administered 2013-09-23: 12:00:00 via TOPICAL

## 2013-09-23 MED ORDER — ALUM & MAG HYDROXIDE-SIMETH 200-200-20 MG/5ML PO SUSP
30.0000 mL | Freq: Four times a day (QID) | ORAL | Status: DC | PRN
  Administered 2013-09-23 – 2013-09-24 (×2): 30 mL via ORAL
  Filled 2013-09-23 (×2): qty 30

## 2013-09-23 MED ORDER — ROCURONIUM BROMIDE 100 MG/10ML IV SOLN
INTRAVENOUS | Status: DC | PRN
  Administered 2013-09-23: 50 mg via INTRAVENOUS
  Administered 2013-09-23: 10 mg via INTRAVENOUS

## 2013-09-23 MED ORDER — MIDAZOLAM HCL 5 MG/5ML IJ SOLN
INTRAMUSCULAR | Status: DC | PRN
  Administered 2013-09-23: 2 mg via INTRAVENOUS

## 2013-09-23 MED ORDER — GLYCOPYRROLATE 0.2 MG/ML IJ SOLN
INTRAMUSCULAR | Status: DC | PRN
  Administered 2013-09-23: 0.6 mg via INTRAVENOUS

## 2013-09-23 MED ORDER — PHENYLEPHRINE HCL 10 MG/ML IJ SOLN
INTRAMUSCULAR | Status: DC | PRN
  Administered 2013-09-23 (×2): 80 ug via INTRAVENOUS

## 2013-09-23 MED ORDER — ONDANSETRON HCL 4 MG/2ML IJ SOLN
INTRAMUSCULAR | Status: DC | PRN
  Administered 2013-09-23: 4 mg via INTRAVENOUS

## 2013-09-23 MED ORDER — LISINOPRIL 20 MG PO TABS
20.0000 mg | ORAL_TABLET | Freq: Every day | ORAL | Status: DC
  Administered 2013-09-24: 20 mg via ORAL
  Filled 2013-09-23 (×2): qty 1

## 2013-09-23 MED ORDER — DEXAMETHASONE SODIUM PHOSPHATE 10 MG/ML IJ SOLN: 10.0000 mg | INTRAMUSCULAR | Status: DC

## 2013-09-23 MED ORDER — SODIUM CHLORIDE 0.9 % IR SOLN
Status: DC | PRN
  Administered 2013-09-23: 12:00:00

## 2013-09-23 MED ORDER — LACTATED RINGERS IV SOLN
INTRAVENOUS | Status: DC
  Administered 2013-09-23: 09:00:00 via INTRAVENOUS

## 2013-09-23 MED ORDER — POTASSIUM CHLORIDE IN NACL 20-0.9 MEQ/L-% IV SOLN
INTRAVENOUS | Status: DC
  Filled 2013-09-23 (×5): qty 1000

## 2013-09-23 MED ORDER — CEFAZOLIN SODIUM-DEXTROSE 2-3 GM-% IV SOLR
2.0000 g | INTRAVENOUS | Status: AC
  Administered 2013-09-23: 2 g via INTRAVENOUS
  Filled 2013-09-23: qty 50

## 2013-09-23 MED ORDER — FENTANYL CITRATE 0.05 MG/ML IJ SOLN
INTRAMUSCULAR | Status: DC | PRN
  Administered 2013-09-23: 50 ug via INTRAVENOUS
  Administered 2013-09-23: 150 ug via INTRAVENOUS
  Administered 2013-09-23: 50 ug via INTRAVENOUS

## 2013-09-23 MED ORDER — 0.9 % SODIUM CHLORIDE (POUR BTL) OPTIME
TOPICAL | Status: DC | PRN
  Administered 2013-09-23: 1000 mL

## 2013-09-23 MED ORDER — METHOCARBAMOL 100 MG/ML IJ SOLN
500.0000 mg | Freq: Four times a day (QID) | INTRAVENOUS | Status: DC | PRN
  Filled 2013-09-23: qty 5

## 2013-09-23 MED ORDER — LATANOPROST 0.005 % OP SOLN
1.0000 [drp] | Freq: Every day | OPHTHALMIC | Status: DC
  Administered 2013-09-23: 1 [drp] via OPHTHALMIC
  Filled 2013-09-23: qty 2.5

## 2013-09-23 MED ORDER — METHOCARBAMOL 500 MG PO TABS
ORAL_TABLET | ORAL | Status: AC
  Filled 2013-09-23: qty 1

## 2013-09-23 MED ORDER — MINOCYCLINE HCL 100 MG PO CAPS
100.0000 mg | ORAL_CAPSULE | Freq: Two times a day (BID) | ORAL | Status: DC
  Administered 2013-09-23 – 2013-09-24 (×3): 100 mg via ORAL
  Filled 2013-09-23 (×5): qty 1

## 2013-09-23 MED ORDER — ACETAMINOPHEN 325 MG PO TABS: 650.0000 mg | ORAL_TABLET | ORAL | Status: DC | PRN

## 2013-09-23 MED ORDER — OXYCODONE-ACETAMINOPHEN 5-325 MG PO TABS
ORAL_TABLET | ORAL | Status: AC
  Filled 2013-09-23: qty 2

## 2013-09-23 MED ORDER — LISINOPRIL-HYDROCHLOROTHIAZIDE 20-25 MG PO TABS: 1.0000 | ORAL_TABLET | Freq: Every day | ORAL | Status: DC

## 2013-09-23 MED ORDER — MENTHOL 3 MG MT LOZG: 1.0000 | LOZENGE | OROMUCOSAL | Status: DC | PRN

## 2013-09-23 MED ORDER — NEOSTIGMINE METHYLSULFATE 1 MG/ML IJ SOLN
INTRAMUSCULAR | Status: DC | PRN
  Administered 2013-09-23: 4 mg via INTRAVENOUS

## 2013-09-23 MED ORDER — LACTATED RINGERS IV SOLN
INTRAVENOUS | Status: DC | PRN
  Administered 2013-09-23 (×2): via INTRAVENOUS

## 2013-09-23 MED ORDER — THROMBIN 20000 UNITS EX SOLR
CUTANEOUS | Status: DC | PRN
  Administered 2013-09-23: 12:00:00 via TOPICAL

## 2013-09-23 MED ORDER — ONDANSETRON HCL 4 MG/2ML IJ SOLN
4.0000 mg | INTRAMUSCULAR | Status: DC | PRN
  Administered 2013-09-24 (×2): 4 mg via INTRAVENOUS
  Filled 2013-09-23 (×3): qty 2

## 2013-09-23 MED ORDER — ACETAMINOPHEN 650 MG RE SUPP: 650.0000 mg | RECTAL | Status: DC | PRN

## 2013-09-23 MED ORDER — SODIUM CHLORIDE 0.9 % IJ SOLN: 3.0000 mL | INTRAMUSCULAR | Status: DC | PRN

## 2013-09-23 MED ORDER — TRAZODONE HCL 150 MG PO TABS
150.0000 mg | ORAL_TABLET | Freq: Every day | ORAL | Status: DC
  Administered 2013-09-23 – 2013-09-24 (×2): 150 mg via ORAL
  Filled 2013-09-23 (×3): qty 1

## 2013-09-23 MED ORDER — SODIUM CHLORIDE 0.9 % IV SOLN: 250.0000 mL | INTRAVENOUS | Status: DC

## 2013-09-23 MED ORDER — SODIUM CHLORIDE 0.9 % IJ SOLN
3.0000 mL | Freq: Two times a day (BID) | INTRAMUSCULAR | Status: DC
  Administered 2013-09-23 – 2013-09-24 (×2): 3 mL via INTRAVENOUS

## 2013-09-23 SURGICAL SUPPLY — 60 items
BAG DECANTER FOR FLEXI CONT (MISCELLANEOUS) ×2 IMPLANT
BENZOIN TINCTURE PRP APPL 2/3 (GAUZE/BANDAGES/DRESSINGS) ×2 IMPLANT
BLADE SURG ROTATE 9660 (MISCELLANEOUS) IMPLANT
BONE MATRIX OSTEOCEL PRO MED (Bone Implant) ×2 IMPLANT
BUR MATCHSTICK NEURO 3.0 LAGG (BURR) ×2 IMPLANT
CANISTER SUCT 3000ML (MISCELLANEOUS) ×2 IMPLANT
CONT SPEC 4OZ CLIKSEAL STRL BL (MISCELLANEOUS) ×2 IMPLANT
DRAPE C-ARM 42X72 X-RAY (DRAPES) ×4 IMPLANT
DRAPE LAPAROTOMY 100X72 PEDS (DRAPES) ×2 IMPLANT
DRAPE POUCH INSTRU U-SHP 10X18 (DRAPES) ×2 IMPLANT
DRESSING TELFA 8X3 (GAUZE/BANDAGES/DRESSINGS) ×2 IMPLANT
DRSG OPSITE 4X5.5 SM (GAUZE/BANDAGES/DRESSINGS) ×4 IMPLANT
DURAPREP 6ML APPLICATOR 50/CS (WOUND CARE) ×2 IMPLANT
ELECT REM PT RETURN 9FT ADLT (ELECTROSURGICAL) ×2
ELECTRODE REM PT RTRN 9FT ADLT (ELECTROSURGICAL) ×1 IMPLANT
EVACUATOR 1/8 PVC DRAIN (DRAIN) IMPLANT
GAUZE SPONGE 4X4 16PLY XRAY LF (GAUZE/BANDAGES/DRESSINGS) IMPLANT
GLOVE BIO SURGEON STRL SZ8 (GLOVE) ×6 IMPLANT
GLOVE BIOGEL PI IND STRL 8.5 (GLOVE) ×1 IMPLANT
GLOVE BIOGEL PI INDICATOR 8.5 (GLOVE) ×1
GLOVE ECLIPSE 7.5 STRL STRAW (GLOVE) ×2 IMPLANT
GLOVE EXAM NITRILE LRG STRL (GLOVE) IMPLANT
GLOVE EXAM NITRILE MD LF STRL (GLOVE) IMPLANT
GLOVE EXAM NITRILE XL STR (GLOVE) IMPLANT
GLOVE EXAM NITRILE XS STR PU (GLOVE) IMPLANT
GLOVE INDICATOR 7.0 STRL GRN (GLOVE) ×2 IMPLANT
GLOVE INDICATOR 7.5 STRL GRN (GLOVE) ×2 IMPLANT
GLOVE OPTIFIT SS 6.5 STRL BRWN (GLOVE) ×8 IMPLANT
GOWN BRE IMP SLV AUR LG STRL (GOWN DISPOSABLE) ×4 IMPLANT
GOWN BRE IMP SLV AUR XL STRL (GOWN DISPOSABLE) ×6 IMPLANT
GOWN STRL REIN 2XL LVL4 (GOWN DISPOSABLE) IMPLANT
HEMOSTAT POWDER KIT SURGIFOAM (HEMOSTASIS) ×2 IMPLANT
KIT BASIN OR (CUSTOM PROCEDURE TRAY) ×2 IMPLANT
KIT ROOM TURNOVER OR (KITS) ×2 IMPLANT
MARKER SKIN DUAL TIP RULER LAB (MISCELLANEOUS) ×2 IMPLANT
NEEDLE HYPO 18GX1.5 BLUNT FILL (NEEDLE) IMPLANT
NEEDLE HYPO 25X1 1.5 SAFETY (NEEDLE) ×2 IMPLANT
NEEDLE SPNL 20GX3.5 QUINCKE YW (NEEDLE) ×2 IMPLANT
NS IRRIG 1000ML POUR BTL (IV SOLUTION) ×2 IMPLANT
PACK LAMINECTOMY NEURO (CUSTOM PROCEDURE TRAY) ×2 IMPLANT
PIN MAYFIELD SKULL DISP (PIN) ×2 IMPLANT
ROD 120MM (Rod) ×1 IMPLANT
ROD SPNL 240X3.5XNS LF TI (Rod) ×1 IMPLANT
SCREW MA MM 3.5X12 (Screw) ×14 IMPLANT
SCREW MA MM 3.5X14 (Screw) ×6 IMPLANT
SCREW SET THREADED (Screw) ×20 IMPLANT
SPONGE GAUZE 4X4 12PLY (GAUZE/BANDAGES/DRESSINGS) ×2 IMPLANT
SPONGE SURGIFOAM ABS GEL 100 (HEMOSTASIS) ×2 IMPLANT
STRIP CLOSURE SKIN 1/2X4 (GAUZE/BANDAGES/DRESSINGS) ×2 IMPLANT
SUT VIC AB 0 CT1 18XCR BRD8 (SUTURE) ×1 IMPLANT
SUT VIC AB 0 CT1 8-18 (SUTURE) ×1
SUT VIC AB 2-0 CP2 18 (SUTURE) ×2 IMPLANT
SUT VIC AB 3-0 SH 8-18 (SUTURE) ×4 IMPLANT
SYR 20ML ECCENTRIC (SYRINGE) ×2 IMPLANT
TOWEL OR 17X24 6PK STRL BLUE (TOWEL DISPOSABLE) ×2 IMPLANT
TOWEL OR 17X26 10 PK STRL BLUE (TOWEL DISPOSABLE) ×2 IMPLANT
TRAY FOLEY CATH 14FRSI W/METER (CATHETERS) IMPLANT
TRAY FOLEY CATH 16FRSI W/METER (SET/KITS/TRAYS/PACK) IMPLANT
UNDERPAD 30X30 INCONTINENT (UNDERPADS AND DIAPERS) IMPLANT
WATER STERILE IRR 1000ML POUR (IV SOLUTION) ×2 IMPLANT

## 2013-09-23 NOTE — Preoperative (Signed)
Beta Blockers   Reason not to administer Beta Blockers:Not Applicable 

## 2013-09-23 NOTE — H&P (Signed)
Subjective:   Patient is a 52 y.o. male admitted for posterior cervical fusion. The patient first presented to me with complaints of neck pain and shooting pains in the arm(s). Onset of symptoms was several months ago. The pain is described as aching, sharp and stabbing and occurs all day. The pain is rated severe, and is located  across the neck and radiates to the shoulders and arm. The symptoms have been progressive. Symptoms are exacerbated by extending head backwards, and are relieved by none.  Previous work up includes CT of cervical spine, results: Multilevel spondylosis and pseudoarthrosis.  Past Medical History  Diagnosis Date  . THYROID NODULE, RIGHT 03/02/2010  . Adjustment disorder with depressed mood 09/2007  . 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 HYPOTENSION 10/31/2009  . Acute prostatitis 05/23/2009  . SPINAL STENOSIS, CERVICAL 08/08/2009  . H/O: substance abuse     hx of ETOH/Crack cocaine-none since 11/08 per pt/Does not Drive due to this  . DJD (degenerative joint disease) of knee     left knee  . DJD (degenerative joint disease) of hip   . HYPERTENSION 08/30/2008  . CERVICAL RADICULOPATHY, LEFT 08/30/2008  . Other and unspecified hyperlipidemia 08/20/2013  . HYPERTHYROIDISM 02/13/2010    pt was told by  Dr Jonny Ruiz  that thyroid was now back to normal .. 2012 ...   . Seasonal allergies   . Legally blind in left eye, as defined in [place]     Blind Left eye, small amt vision Right eye    Past Surgical History  Procedure Laterality Date  . Right hand i&d  12/07    s/p rdue to abscess-Dr. Amanda Pea  . Anterior cervical decomp/discectomy fusion  10/11/2011    Procedure: ANTERIOR CERVICAL DECOMPRESSION/DISCECTOMY FUSION 3 LEVELS;  Surgeon: Tia Alert;  Location: [facility];  Service: Neurosurgery;  Laterality: N/A;  Cervical three-four ,cervical four five cervical five six Anterior Cervical Decompression Fusion with peek +  plate Nuvasive translational plate Orthofix peek (2 1/2 hours) Rm # [id number]  . Total hip arthroplasty  12/17/2011    Procedure: TOTAL HIP ARTHROPLASTY;  Surgeon: Eulas Post, MD;  Location: [facility];  Service: Orthopedics;  Laterality: Left;    Allergies  Allergen Reactions  . Hydrocodone Itching    10 -325 makes him itch but 10-500 doesn't  . Morphine And Related Itching  . Tramadol Itching    History  Substance Use Topics  . Smoking status: Current Every Day Smoker -- 1.00 packs/day for 40 years    Types: Cigarettes  . Smokeless tobacco: Not on file     Comment: last crack over 1 yr   alcohol last time few yrs  . Alcohol Use: No     Comment: hx of alcohol abuse    Family History  Problem Relation Age of Onset  . Arthritis Mother   . Hyperlipidemia Mother   . Goiter Mother     resection of benign goiter  . Alcohol abuse Father   . Stroke Father   . Heart disease Father   . Hypertension Sister   . Diabetes Sister   . Goiter Sister     resection of benign goiter  . Diabetes Brother   . Alcohol abuse Brother   . Alcohol abuse Cousin   . Heart disease Other     Aunt   Prior to Admission medications   Medication Sig Start Date End Date Taking? Authorizing  Provider  adapalene (DIFFERIN) 0.1 % gel Apply 1 application topically at bedtime.  07/18/13  Yes Historical Provider, MD  aspirin 81 MG tablet Take 81 mg by mouth daily.     Yes Historical Provider, MD  benzoyl peroxide 5 % cream Apply 1 application topically daily.   Yes Historical Provider, MD  bimatoprost (LUMIGAN) 0.01 % SOLN Place 1 drop into both eyes at bedtime.   Yes Historical Provider, MD  carboxymethylcellulose (REFRESH PLUS) 0.5 % SOLN Place 1 drop into both eyes 3 (three) times daily as needed (keep eyes clear).    Yes Historical Provider, MD  cetirizine (ZYRTEC) 10 MG tablet Take 10 mg by mouth daily as needed for allergies.    Yes Historical Provider, MD  Diclofenac Sodium (PENNSAID) 2 % SOLN Place 1  application onto the skin 2 (two) times daily as needed (pain).   Yes Historical Provider, MD  fluticasone (FLONASE) 50 MCG/ACT nasal spray Place 2 sprays into both nostrils daily.   Yes Historical Provider, MD  lisinopril-hydrochlorothiazide (PRINZIDE,ZESTORETIC) 20-25 MG per tablet Take 1 tablet by mouth daily. 06/15/13  Yes Corwin Levins, MD  minocycline (MINOCIN,DYNACIN) 100 MG capsule Take 100 mg by mouth 2 (two) times daily.  07/18/13  Yes Historical Provider, MD  Multiple Vitamins-Minerals (MULTIVITAMIN WITH MINERALS) tablet Take 1 tablet by mouth daily.   Yes Historical Provider, MD  ondansetron (ZOFRAN) 4 MG tablet Take 1 tablet (4 mg total) by mouth every 8 (eight) hours as needed for nausea. 03/11/13  Yes Corwin Levins, MD  traZODone (DESYREL) 150 MG tablet Take 1 tablet (150 mg total) by mouth at bedtime. 06/15/13  Yes Corwin Levins, MD     Review of Systems  Positive ROS: neg  All other systems have been reviewed and were otherwise negative with the exception of those mentioned in the HPI and as above.  Objective: Vital signs in last 24 hours: Temp:  [97.7 F (36.5 C)] 97.7 F (36.5 C) (12/11 0907) Pulse Rate:  [81] 81 (12/11 0907) Resp:  [18] 18 (12/11 0907) BP: (136)/(86) 136/86 mmHg (12/11 0907) SpO2:  [100 %] 100 % (12/11 0907)  General Appearance: Alert, cooperative, no distress, appears stated age Head: Normocephalic, without obvious abnormality, atraumatic Eyes: PERRL, conjunctiva/corneas clear, EOM's intact      Neck: Supple, symmetrical, trachea midline, Back: Symmetric, no curvature, ROM normal, no CVA tenderness Lungs:  respirations unlabored Heart: Regular rate and rhythm Abdomen: Soft, non-tender Extremities: Extremities normal, atraumatic, no cyanosis or edema Pulses: 2+ and symmetric all extremities Skin: Skin color, texture, turgor normal, no rashes or lesions  NEUROLOGIC:  Mental status: Alert and oriented x4, no aphasia, good attention span, fund of  knowledge and memory  Motor Exam - grossly normal Sensory Exam - grossly normal Reflexes: 1+ Coordination - grossly normal Gait - grossly normal Balance - grossly normal Cranial Nerves: I: smell Not tested  II: visual acuity  OS: nl    OD: nl  II: visual fields Full to confrontation  II: pupils Equal, round, reactive to light  III,VII: ptosis None  III,IV,VI: extraocular muscles  Full ROM  V: mastication Normal  V: facial light touch sensation  Normal  V,VII: corneal reflex  Present  VII: facial muscle function - upper  Normal  VII: facial muscle function - lower Normal  VIII: hearing Not tested  IX: soft palate elevation  Normal  IX,X: gag reflex Present  XI: trapezius strength  5/5  XI: sternocleidomastoid strength 5/5  XI:  neck flexion strength  5/5  XII: tongue strength  Normal    Data Review Lab Results  Component Value Date   WBC 6.6 09/21/2013   HGB 14.1 09/21/2013   HCT 41.7 09/21/2013   MCV 93.7 09/21/2013   PLT 372 09/21/2013   Lab Results  Component Value Date   NA 137 09/21/2013   K 4.0 09/21/2013   CL 99 09/21/2013   CO2 29 09/21/2013   BUN 11 09/21/2013   CREATININE 1.05 09/21/2013   GLUCOSE 79 09/21/2013   Lab Results  Component Value Date   INR 0.93 09/21/2013    Assessment:   Cervical neck pain with herniated nucleus pulposus/ spondylosis/ stenosis with pseudoarthrosis at C3-C7. Patient has failed conservative therapy. Planned surgery : Posterior cervical fusion C2-7.  Plan:   I explained the condition and procedure to the patient and answered any questions.  Patient wishes to proceed with procedure as planned. Understands risks/ benefits/ and expected or typical outcomes.  JONES,DAVID S 09/23/2013 11:10 AM

## 2013-09-23 NOTE — Op Note (Signed)
09/23/2013  1:58 PM  PATIENT:  Mark Dunlap  52 y.o. male  PRE-OPERATIVE DIAGNOSIS:  Cervical spondylosis; pseudoarthrosis; neck and arm pain  POST-OPERATIVE DIAGNOSIS:  Same  PROCEDURE:  1. Posterior cervical fusion C2-3, C3-4, C4-5, C5-6, C6-7, 2. Posterior cervical lateral mass instrumentation C3-C7 using Nuvasive screws  SURGEON:  Marikay Alar, MD  ASSISTANTS: Dr. Venetia Maxon  ANESTHESIA:   General  EBL: 75 ml  Total I/O In: 1500 [I.V.:1500] Out: 75 [Blood:75]  BLOOD ADMINISTERED:none  DRAINS: Medium Hemovac   SPECIMEN:  No Specimen  INDICATION FOR PROCEDURE: This patient underwent a previous ACDF at C3-4 C4-5 and C5-6. He presented with severe neck pain. CT scan showed a pseudoarthrosis but also showed adjacent level spondylosis. I recommended a posterior cervical fusion. Patient understood the risks, benefits, and alternatives and potential outcomes and wished to proceed.  PROCEDURE DETAILS: The patient was brought to the operating room. Generalized endotracheal anesthesia was induced. The patient was affixed a 3 point [place] headrest and rolled into the prone position on chest rolls. All pressure points were padded. The posterior cervical region was cleaned and prepped with DuraPrep and then draped in the usual sterile fashion. 7 cc of local anesthesia was injected and a dorsal midline incision made in the posterior cervical region and carried down to the cervical fascia. The fascia was opened and the paraspinous musculature was taken down to expose C2 down to C7 I laterally. Intraoperative fluoroscopy confirmed my level and then the dissection was carried out over the lateral facets. I localized the midpoint of each lateral mass and marked a region 1 mm medial to the midpoint of the lateral mass from C3-C7, and then drilled in an upward and outward direction into the safe zone of each lateral mass. I drilled to a depth of 12 mm and then checked my drill hole with a ball  probe. I then placed a 12 mm lateral mass screws into the safe zone of each lateral mass until they were 2 fingers tight. I then decorticated the lateral masses and the facet joints and packed them with local autograft and morcellized allograft to perform arthrodesis from C2-3 to C6-7 inclusive. I then placed rods into the multiaxial screw heads of the screws and locked these into position with the locking caps and anti-torque device. I then checked the final construct with AP/Lat fluoroscopy. I irrigated with saline solution containing bacitracin. I placed a medium Hemovac drain through separate stab incision. After hemostasis was achieved I closed the muscle and the fascia with 0 Vicryl, subcutaneous tissue with 2-0 Vicryl, and the subcuticular tissue with 3-0 Vicryl. The skin was closed with benzoin and Steri-Strips. A sterile dressing was applied, the patient was turned to the supine position and taken out of the headrest, awakened from general anesthesia and transferred to the recovery room in stable condition. At the end of the procedure all sponge, needle and instrument counts were correct.   PLAN OF CARE: Admit to inpatient   PATIENT DISPOSITION:  PACU - hemodynamically stable.   Delay start of Pharmacological VTE agent (>24hrs) due to surgical blood loss or risk of bleeding:  yes

## 2013-09-23 NOTE — Anesthesia Postprocedure Evaluation (Signed)
  Anesthesia Post-op Note  Patient: Mark Dunlap  Procedure(s) Performed: Procedure(s): CERVICALTWO TO CERVICAL SEVEN POSTERIOR CERVICAL FUSION/FORAMINOTOMY WITH LATERAL MASS FIXATION (N/A)  Patient Location: PACU  Anesthesia Type:General  Level of Consciousness: awake  Airway and Oxygen Therapy: Patient Spontanous Breathing  Post-op Pain: mild  Post-op Assessment: Post-op Vital signs reviewed  Post-op Vital Signs: Reviewed  Complications: No apparent anesthesia complications

## 2013-09-23 NOTE — Anesthesia Preprocedure Evaluation (Signed)
Anesthesia Evaluation  Patient identified by MRN, date of birth, ID band Patient awake    Airway Mallampati: II      Dental   Pulmonary Current Smoker,          Cardiovascular hypertension,     Neuro/Psych    GI/Hepatic negative GI ROS, Neg liver ROS,   Endo/Other  Hyperthyroidism   Renal/GU      Musculoskeletal   Abdominal   Peds  Hematology   Anesthesia Other Findings   Reproductive/Obstetrics                           Anesthesia Physical Anesthesia Plan  ASA: III  Anesthesia Plan: General   Post-op Pain Management:    Induction: Intravenous  Airway Management Planned: Oral ETT  Additional Equipment:   Intra-op Plan:   Post-operative Plan: Extubation in OR  Informed Consent: I have reviewed the patients History and Physical, chart, labs and discussed the procedure including the risks, benefits and alternatives for the proposed anesthesia with the patient or authorized representative who has indicated his/her understanding and acceptance.   Dental advisory given  Plan Discussed with: CRNA, Anesthesiologist and Surgeon  Anesthesia Plan Comments:         Anesthesia Quick Evaluation

## 2013-09-23 NOTE — Anesthesia Procedure Notes (Signed)
Procedure Name: Intubation Date/Time: 09/23/2013 11:55 AM Performed by: Gwenyth Allegra Pre-anesthesia Checklist: Timeout performed, Patient identified, Emergency Drugs available, Suction available and Patient being monitored Patient Re-evaluated:Patient Re-evaluated prior to inductionOxygen Delivery Method: Circle system utilized Preoxygenation: Pre-oxygenation with 100% oxygen Intubation Type: IV induction Tube type: Oral Number of attempts: 1 Airway Equipment and Method: Stylet Secured at: 22 cm Tube secured with: Tape Dental Injury: Teeth and Oropharynx as per pre-operative assessment

## 2013-09-23 NOTE — Transfer of Care (Signed)
Immediate Anesthesia Transfer of Care Note  Patient: Mark Dunlap  Procedure(s) Performed: Procedure(s): CERVICALTWO TO CERVICAL SEVEN POSTERIOR CERVICAL FUSION/FORAMINOTOMY WITH LATERAL MASS FIXATION (N/A)  Patient Location: PACU  Anesthesia Type:General  Level of Consciousness: awake, alert  and oriented  Airway & Oxygen Therapy: Patient Spontanous Breathing and Patient connected to nasal cannula oxygen  Post-op Assessment: Report given to PACU RN and Post -op Vital signs reviewed and stable  Post vital signs: Reviewed and stable  Complications: No apparent anesthesia complications

## 2013-09-24 NOTE — Progress Notes (Signed)
Patient ID: Mark Dunlap, male   DOB: December 11, 1960, 52 y.o.   MRN: [id number] Subjective: Patient reports significant postoperative neck pain. No arm pain or numbness tingling or weakness.  Objective: Vital signs in last 24 hours: Temp:  [97.5 F (36.4 C)-98.7 F (37.1 C)] 97.7 F (36.5 C) (12/12 0751) Pulse Rate:  [55-90] 75 (12/12 0751) Resp:  [16-20] 16 (12/12 0751) BP: (114-142)/(73-93) 117/73 mmHg (12/12 0751) SpO2:  [93 %-100 %] 93 % (12/12 0751)  Intake/Output from previous day: 12/11 0701 - 12/12 0700 In: 1500 [I.V.:1500] Out: 1335 [Urine:780; Emesis/NG output:300; Drains:180; Blood:75] Intake/Output this shift:    Neurologic: Grossly normal  Lab Results: Lab Results  Component Value Date   WBC 6.6 09/21/2013   HGB 14.1 09/21/2013   HCT 41.7 09/21/2013   MCV 93.7 09/21/2013   PLT 372 09/21/2013   Lab Results  Component Value Date   INR 0.93 09/21/2013   BMET Lab Results  Component Value Date   NA 137 09/21/2013   K 4.0 09/21/2013   CL 99 09/21/2013   CO2 29 09/21/2013   GLUCOSE 79 09/21/2013   BUN 11 09/21/2013   CREATININE 1.05 09/21/2013   CALCIUM 9.8 09/21/2013    Studies/Results: Dg Cervical Spine 2-3 Views  09/23/2013   CLINICAL DATA:  Portable views of the cervical spine tearing cervical fusion.  EXAM: CERVICAL SPINE - 2-3 VIEW  COMPARISON:  Cervical CT, 07/20/2013  FINDINGS: Lateral and AP images show placement of pedicle screws from C3 through C7 bilaterally with interconnecting rods. There is an anterior fusion plate at C3, C4, C5 and C6 with associated fixation screws. The orthopedic hardware is well-seated. There is radiolucent disc spacer material partly maintaining disc height at C3-C4, C4-C5 and C5-C6.  No acute fracture or malalignment is seen. No evidence of an operative complication.  IMPRESSION: Posterior cervical fusion images as detailed. Fusion hardware appears well seated and aligned.   Electronically Signed   By: Amie Portland M.D.   On:  09/23/2013 14:58    Assessment/Plan: Patient has significant pain that we still need to control here in the hospital for one more day. Discharge tomorrow.   LOS: 1 day    Edwyna Dangerfield S 09/24/2013, 8:36 AM

## 2013-09-25 MED ORDER — OXYCODONE-ACETAMINOPHEN 5-325 MG PO TABS: 1.0000 | ORAL_TABLET | ORAL | Status: DC | PRN

## 2013-09-25 MED ORDER — METHOCARBAMOL 500 MG PO TABS: 500.0000 mg | ORAL_TABLET | Freq: Four times a day (QID) | ORAL | Status: DC | PRN

## 2013-09-25 NOTE — Discharge Summary (Signed)
Physician Discharge Summary  Patient ID: Mark Dunlap MRN: [id number] DOB/AGE: 03/19/1961 52 y.o.  Admit date: 09/23/2013 Discharge date: 09/25/2013  Admission Diagnoses: Pseudoarthrosis C3-C7  Discharge Diagnoses: Pseudoarthrosis C3-C7 with spondylosis and myelopathy Active Problems:   S/P cervical spinal fusion   Discharged Condition: fair  Hospital Course: Patient was admitted to undergo posterior supplemental fixation and arthrodesis for a pseudoarthrosis at C3-C7. He tolerated surgery well.  Consults: None  Significant Diagnostic Studies: None  Treatments: Posterior arthrodesis C3-C7 with new base of the instrumentation and allograft.  Discharge Exam: Blood pressure 113/74, pulse 85, temperature 97.6 F (36.4 C), temperature source Oral, resp. rate 18, SpO2 92.00%. Modest findings of cervical myelopathy with weakness in the hands and difficulty with fine motor coordination. Incision is clean and dry.  Disposition: Discharge home  Discharge Orders   Future Orders Complete By Expires   Call MD for:  redness, tenderness, or signs of infection (pain, swelling, redness, odor or green/yellow discharge around incision site)  As directed    Call MD for:  severe uncontrolled pain  As directed    Call MD for:  temperature >100.4  As directed    Diet - low sodium heart healthy  As directed    Increase activity slowly  As directed        Medication List         adapalene 0.1 % gel  Commonly known as:  DIFFERIN  Apply 1 application topically at bedtime.     aspirin 81 MG tablet  Take 81 mg by mouth daily.     benzoyl peroxide 5 % cream  Apply 1 application topically daily.     bimatoprost 0.01 % Soln  Commonly known as:  LUMIGAN  Place 1 drop into both eyes at bedtime.     carboxymethylcellulose 0.5 % Soln  Commonly known as:  REFRESH PLUS  Place 1 drop into both eyes 3 (three) times daily as needed (keep eyes clear).     cetirizine 10 MG tablet  Commonly  known as:  ZYRTEC  Take 10 mg by mouth daily as needed for allergies.     fluticasone 50 MCG/ACT nasal spray  Commonly known as:  FLONASE  Place 2 sprays into both nostrils daily.     lisinopril-hydrochlorothiazide 20-25 MG per tablet  Commonly known as:  PRINZIDE,ZESTORETIC  Take 1 tablet by mouth daily.     methocarbamol 500 MG tablet  Commonly known as:  ROBAXIN  Take 1 tablet (500 mg total) by mouth every 6 (six) hours as needed for muscle spasms.     minocycline 100 MG capsule  Commonly known as:  MINOCIN,DYNACIN  Take 100 mg by mouth 2 (two) times daily.     multivitamin with minerals tablet  Take 1 tablet by mouth daily.     ondansetron 4 MG tablet  Commonly known as:  ZOFRAN  Take 1 tablet (4 mg total) by mouth every 8 (eight) hours as needed for nausea.     oxyCODONE-acetaminophen 5-325 MG per tablet  Commonly known as:  PERCOCET/ROXICET  Take 1-2 tablets by mouth every 4 (four) hours as needed for moderate pain.     PENNSAID 2 % Soln  Generic drug:  Diclofenac Sodium  Place 1 application onto the skin 2 (two) times daily as needed (pain).     traZODone 150 MG tablet  Commonly known as:  DESYREL  Take 1 tablet (150 mg total) by mouth at bedtime.  SignedStefani Dama 09/25/2013, 8:58 AM

## 2013-09-25 NOTE — Progress Notes (Signed)
Pt given D/C instructions with Rx's, verbal understanding was given. Pt D/C'd home via walking with NT @ 0935 per MD order. Pt stable @ D/C and had no other needs. Rema Fendt, RN

## 2013-09-28 ENCOUNTER — Telehealth: Payer: Self-pay

## 2013-09-28 ENCOUNTER — Encounter (HOSPITAL_COMMUNITY): Payer: Self-pay | Admitting: Neurological Surgery

## 2013-09-28 MED ORDER — VARENICLINE TARTRATE 0.5 MG X 11 & 1 MG X 42 PO MISC: ORAL | Status: DC

## 2013-09-28 MED ORDER — VARENICLINE TARTRATE 1 MG PO TABS: 1.0000 mg | ORAL_TABLET | Freq: Two times a day (BID) | ORAL | Status: DC

## 2013-09-28 NOTE — Telephone Encounter (Signed)
The patient states he would like something called in to help him to stop smoking.  Please advise

## 2013-09-28 NOTE — Telephone Encounter (Signed)
Called the patient to inform and faxed to [facility]

## 2013-09-28 NOTE — Telephone Encounter (Signed)
Done hardcopy to robin  

## 2013-10-28 ENCOUNTER — Telehealth: Payer: Self-pay | Admitting: Internal Medicine

## 2013-10-28 MED ORDER — ZOLPIDEM TARTRATE 10 MG PO TABS: 10.0000 mg | ORAL_TABLET | Freq: Every evening | ORAL | Status: DC | PRN

## 2013-10-28 NOTE — Telephone Encounter (Signed)
Pt called stated that Trazodone 150 mg is not working (took the med, pt didn't go sleep since yesterday), pt request something stronger because pt can not go sleep. Please send this to [facility] if this is ok. Please advise.

## 2013-10-28 NOTE — Telephone Encounter (Signed)
Done hardcopy to [facility]bin - for ambien prn, possibly short term, then get back to the trazodone 150 (hold the trazodone when taking the [place])

## 2013-10-28 NOTE — Telephone Encounter (Signed)
Faxed hardcopy to [facility] and called the patient informed of MD instructions

## 2013-12-13 ENCOUNTER — Other Ambulatory Visit: Payer: Self-pay | Admitting: Internal Medicine

## 2013-12-25 ENCOUNTER — Other Ambulatory Visit: Payer: Self-pay | Admitting: Internal Medicine

## 2013-12-28 NOTE — Telephone Encounter (Signed)
Faxed hardcopy to [facility]  

## 2013-12-28 NOTE — Telephone Encounter (Signed)
Done hardcopy to robin  

## 2013-12-31 ENCOUNTER — Encounter: Payer: Self-pay | Admitting: Internal Medicine

## 2013-12-31 ENCOUNTER — Ambulatory Visit (INDEPENDENT_AMBULATORY_CARE_PROVIDER_SITE_OTHER): Payer: Medicare Other | Admitting: Internal Medicine

## 2013-12-31 VITALS — BP 110/68 | HR 89 | Temp 98.1°F | Wt 152.0 lb

## 2013-12-31 DIAGNOSIS — Z0000 Encounter for general adult medical examination without abnormal findings: Secondary | ICD-10-CM

## 2013-12-31 DIAGNOSIS — L509 Urticaria, unspecified: Secondary | ICD-10-CM

## 2013-12-31 DIAGNOSIS — F17200 Nicotine dependence, unspecified, uncomplicated: Secondary | ICD-10-CM

## 2013-12-31 DIAGNOSIS — G8929 Other chronic pain: Secondary | ICD-10-CM

## 2013-12-31 DIAGNOSIS — I10 Essential (primary) hypertension: Secondary | ICD-10-CM

## 2013-12-31 MED ORDER — AMLODIPINE BESYLATE 2.5 MG PO TABS: 2.5000 mg | ORAL_TABLET | Freq: Every day | ORAL | Status: DC

## 2013-12-31 MED ORDER — METHYLPREDNISOLONE ACETATE 80 MG/ML IJ SUSP
80.0000 mg | Freq: Once | INTRAMUSCULAR | Status: AC
  Administered 2013-12-31: 80 mg via INTRAMUSCULAR

## 2013-12-31 MED ORDER — BUPROPION HCL ER (SR) 100 MG PO TB12: 100.0000 mg | ORAL_TABLET | Freq: Two times a day (BID) | ORAL | Status: DC

## 2013-12-31 MED ORDER — HYDROCHLOROTHIAZIDE 25 MG PO TABS: 25.0000 mg | ORAL_TABLET | Freq: Every day | ORAL | Status: DC

## 2013-12-31 NOTE — Progress Notes (Signed)
Subjective:    Patient ID: Mark Dunlap, male    DOB: June 17, 1961, 53 y.o.   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  Still has some aching with neck flexion while at work but overall much improved pain postop.  No hx of food allergy or hives.  Was tx yesterday with high dose prednisone taper - started with 60 mg yesterday and tolerated ok, today took the prescribed 50 mg but unfortunately for some reason gagged/coughed and thinks he may have thrown up the pills.  No further n/v, abd pain, fever, bowel change, dysphagia or recent wt loss.  Asks for new med to help quit smoking since he is here, plans to use with otc nicotine patch, though he c/o the cost of the patches. Past Medical History  Diagnosis Date  . THYROID NODULE, RIGHT 03/02/2010  . Adjustment disorder with depressed mood 09/2007  . 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 HYPOTENSION 10/31/2009  . Acute prostatitis 05/23/2009  . SPINAL STENOSIS, CERVICAL 08/08/2009  . H/O: substance abuse     hx of ETOH/Crack cocaine-none since 11/08 per pt/Does not Drive due to this  . DJD (degenerative joint disease) of knee     left knee  . DJD (degenerative joint disease) of hip   . HYPERTENSION 08/30/2008  . CERVICAL RADICULOPATHY, LEFT 08/30/2008  . Other and unspecified hyperlipidemia 08/20/2013  . HYPERTHYROIDISM 02/13/2010    pt was told by  Dr Jenny Reichmann  that thyroid was now back to normal .. 2012 ...   .  Seasonal allergies   . Legally blind in left eye, as defined in [place]     Blind Left eye, small amt vision Right eye   Past Surgical History  Procedure Laterality Date  . Right hand i&d  12/07    s/p rdue to abscess-Dr. Amedeo Plenty  . Anterior cervical decomp/discectomy fusion  10/11/2011    Procedure: ANTERIOR CERVICAL DECOMPRESSION/DISCECTOMY FUSION 3 LEVELS;  Surgeon: Eustace Moore;  Location: [facility];  Service: Neurosurgery;  Laterality: N/A;  Cervical three-four ,cervical four five cervical five six Anterior Cervical Decompression Fusion with peek + plate Nuvasive translational plate Orthofix peek (2 1/2 hours) Rm # [id number]  . Total hip arthroplasty  12/17/2011    Procedure: TOTAL HIP ARTHROPLASTY;  Surgeon: Johnny Bridge, MD;  Location: [facility];  Service: Orthopedics;  Laterality: Left;  . Posterior cervical fusion/foraminotomy N/A 09/23/2013    Procedure: CERVICALTWO TO CERVICAL SEVEN POSTERIOR CERVICAL FUSION/FORAMINOTOMY WITH LATERAL MASS FIXATION;  Surgeon: Eustace Moore, MD;  Location: [facility];  Service: Neurosurgery;  Laterality: N/A;    reports that he has been smoking Cigarettes.  He has a 40 pack-year smoking history. He does not have any smokeless tobacco history on file. He reports that he uses illicit drugs ("Crack" cocaine). He reports that he does not drink alcohol. family history includes Alcohol abuse in his brother, cousin, and father; Arthritis in his  mother; Diabetes in his brother and sister; Goiter in his mother and sister; Heart disease in his father and other; Hyperlipidemia in his mother; Hypertension in his sister; Stroke in his father. Allergies  Allergen Reactions  . Hydrocodone Itching    10 -325 makes him itch but 10-500 doesn't  . Morphine And Related Itching  . Tramadol Itching    Current Outpatient Prescriptions on File Prior to Visit  Medication Sig Dispense Refill  . adapalene (DIFFERIN) 0.1 % gel Apply 1 application topically at bedtime.       [name]  aspirin 81 MG tablet Take 81 mg by mouth daily.        . benzoyl peroxide 5 % cream Apply 1 application topically daily.      . bimatoprost (LUMIGAN) 0.01 % SOLN Place 1 drop into both eyes at bedtime.      . carboxymethylcellulose (REFRESH PLUS) 0.5 % SOLN Place 1 drop into both eyes 3 (three) times daily as needed (keep eyes clear).       . cetirizine (ZYRTEC) 10 MG tablet Take 10 mg by mouth daily as needed for allergies.       . Diclofenac Sodium (PENNSAID) 2 % SOLN Place 1 application onto the skin 2 (two) times daily as needed (pain).      . fluticasone (FLONASE) 50 MCG/ACT nasal spray Place 2 sprays into both nostrils daily.      [name] lisinopril-hydrochlorothiazide (PRINZIDE,ZESTORETIC) 20-25 MG per tablet Take 1 tablet by mouth daily.  90 tablet  3  . methocarbamol (ROBAXIN) 500 MG tablet Take 1 tablet (500 mg total) by mouth every 6 (six) hours as needed for muscle spasms.  60 tablet  3  . minocycline (MINOCIN,DYNACIN) 100 MG capsule Take 100 mg by mouth 2 (two) times daily.       . Multiple Vitamins-Minerals (MULTIVITAMIN WITH MINERALS) tablet Take 1 tablet by mouth daily.      . ondansetron (ZOFRAN) 4 MG tablet TAKE ONE TABLET BY MOUTH EVERY 8 HOURS AS NEEDED FOR NAUSEA  40 tablet  0  . oxyCODONE-acetaminophen (PERCOCET/ROXICET) 5-325 MG per tablet Take 1-2 tablets by mouth every 4 (four) hours as needed for moderate pain.  60 tablet  0  . traZODone (DESYREL) 150 MG tablet Take 1 tablet (150 mg total) by mouth at bedtime.  90 tablet  1  . varenicline (CHANTIX CONTINUING MONTH PAK) 1 MG tablet Take 1 tablet (1 mg total) by mouth 2 (two) times daily.  60 tablet  1  . varenicline (CHANTIX STARTING MONTH PAK) 0.5 MG X 11 & 1 MG X 42 tablet Take one 0.5 mg tablet by mouth once daily for 3 days, then increase to one 0.5 mg tablet twice daily for 4 days, then increase to one 1 mg tablet twice daily.  53 tablet  0  . zolpidem (AMBIEN) 10 MG tablet TAKE 1 TABLET BY MOUTH AT BEDTIME AS NEEDED FOR SLEEP   30 tablet  3   No current facility-administered medications on file prior to visit.   Review of Systems  Constitutional: Negative for unexpected weight change, or unusual diaphoresis  HENT: Negative for tinnitus.   Eyes: Negative for photophobia and visual disturbance.  Respiratory: Negative for choking and stridor.   Gastrointestinal: Negative for vomiting and blood in stool.  Genitourinary: Negative for hematuria and decreased urine volume.  Musculoskeletal: Negative for acute joint swelling Skin: Negative for color change and wound.  Neurological: Negative for tremors and numbness other than noted  Psychiatric/Behavioral: Negative for decreased concentration or  hyperactivity.       Objective:   Physical Exam BP 110/68  Pulse 89  Temp(Src) 98.1 F (36.7 C) (Oral)  Wt 152 lb (68.947 kg)  SpO2 99% VS noted,  Constitutional: Pt appears well-developed and well-nourished.  HENT: Head: NCAT.  Right Ear: External ear normal.  Left Ear: External ear normal.  Eyes: Conjunctivae and EOM are normal. Pupils are equal, round, and reactive to light.  Neck: Normal range of motion. Neck supple.  Cardiovascular: Normal rate and regular rhythm.   Pulmonary/Chest: Effort normal and breath sounds without rales or wheezing.  Abd:  Soft, NT, non-distended, + BS Neurological: Pt is alert. Not confused  Skin: numerous wheel and flare and very small areas of slight raised angioedematous areas No tongue swelling, No throat swelling Psychiatric: Pt behavior is normal. Thought content normal.     Assessment & Plan:

## 2013-12-31 NOTE — Addendum Note (Signed)
Addended by: Biagio Borg on: 12/31/2013 10:31 AM   Modules accepted: Level of Service

## 2013-12-31 NOTE — Addendum Note (Signed)
Addended by: Sharon Seller B on: 12/31/2013 10:35 AM   Modules accepted: Orders

## 2013-12-31 NOTE — Assessment & Plan Note (Signed)
To chagne acei-hct to amlod 2.5, and hct 25 qd, f/u next visit

## 2013-12-31 NOTE — Progress Notes (Signed)
Pre visit review using our clinic review tool, if applicable. No additional management support is needed unless otherwise documented below in the visit note. 

## 2013-12-31 NOTE — Assessment & Plan Note (Signed)
To f/u with surgeon regarding post op pain

## 2013-12-31 NOTE — Assessment & Plan Note (Signed)
Etiology unclear, and not clear if he was able to keep down the prednisone from this am.  For depomedrol IM today, then continue predpack as prescribed  - next dosing tomorrow.  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  Doubt the oxycodone or tizanidine, though would re-assess if hives persists or worsens.

## 2013-12-31 NOTE — Patient Instructions (Addendum)
You had the steroid shot today  Please stop the Chantix, as well as the lisinopril-HCT as we cannot be sure these did not cause the rash and swelling  Please re-start the prednisone tomorrow as you been prescribed  You can also continue the zyrtec (which is also available OTC) for the rash, and you can also use topical Benadryl cream (OTC) for the worst areas of itching  Please continue all other medications as before, and refills have been done if requested. Please have the pharmacy call with any other refills you may need.  OK to continue work as you have been doing, and you are given the letters today  Please take all new medication as prescribed - the wellbutrin for smoking, which can also be used at the same time as the OTC patches as you mentioned  Please take all new medication as prescribed - the amlodipine 2.5 mg per day, and hydrochlorothiazide 25 mg per day, to make up for not taking the lisinopril-HCT medication for blood pressure  Please keep your appointments with your specialists as you have planned - your surgeon  Please return in 3 months, or sooner if needed, with Lab testing done 3-5 days before

## 2013-12-31 NOTE — Assessment & Plan Note (Signed)
For wellbutrin asd,  to f/u any worsening symptoms or concerns

## 2014-01-03 ENCOUNTER — Telehealth: Payer: Self-pay | Admitting: Internal Medicine

## 2014-01-03 NOTE — Telephone Encounter (Signed)
Relevant patient education mailed to patient.  

## 2014-01-30 ENCOUNTER — Other Ambulatory Visit: Payer: Self-pay | Admitting: Internal Medicine

## 2014-03-03 ENCOUNTER — Other Ambulatory Visit: Payer: Self-pay | Admitting: Neurological Surgery

## 2014-03-03 DIAGNOSIS — M542 Cervicalgia: Secondary | ICD-10-CM

## 2014-03-18 ENCOUNTER — Inpatient Hospital Stay
Admission: RE | Admit: 2014-03-18 | Discharge: 2014-03-18 | Disposition: A | Discharge status: Home / Self Care | Payer: Medicare Other | Source: Ambulatory Visit | Attending: Neurological Surgery | Admitting: Neurological Surgery

## 2014-03-18 ENCOUNTER — Other Ambulatory Visit: Payer: Medicare Other

## 2014-03-18 NOTE — Discharge Instructions (Signed)
Myelogram Discharge Instructions  1. Go home and rest quietly for the next 24 hours.  It is important to lie flat for the next 24 hours.  Get up only to go to the restroom.  You may lie in the bed or on a couch on your back, your stomach, your left side or your right side.  You may have one pillow under your head.  You may have pillows between your knees while you are on your side or under your knees while you are on your back.  2. DO NOT drive today.  Recline the seat as far back as it will go, while still wearing your seat belt, on the way home.  3. You may get up to go to the bathroom as needed.  You may sit up for 10 minutes to eat.  You may resume your normal diet and medications unless otherwise indicated.  Drink lots of extra fluids today and tomorrow.  4. The incidence of headache, nausea, or vomiting is about 5% (one in 20 patients).  If you develop a headache, lie flat and drink plenty of fluids until the headache goes away.  Caffeinated beverages may be helpful.  If you develop severe nausea and vomiting or a headache that does not go away with flat bed rest, call [phone].  5. You may resume normal activities after your 24 hours of bed rest is over; however, do not exert yourself strongly or do any heavy lifting tomorrow. If when you get up you have a headache when standing, go back to bed and force fluids for another 24 hours.  6. Call your physician for a follow-up appointment.  The results of your myelogram will be sent directly to your physician by the following day.  7. If you have any questions or if complications develop after you arrive home, please call [phone].  Discharge instructions have been explained to the patient.  The patient, or the person responsible for the patient, fully understands these instructions.      May resume Wellbutrin on March 19, 2014, after 1:00 pm.

## 2014-03-21 ENCOUNTER — Other Ambulatory Visit: Payer: Medicare Other

## 2014-03-25 ENCOUNTER — Ambulatory Visit
Admission: RE | Admit: 2014-03-25 | Discharge: 2014-03-25 | Disposition: A | Discharge status: Home / Self Care | Payer: Medicare Other | Source: Ambulatory Visit | Attending: Neurological Surgery | Admitting: Neurological Surgery

## 2014-03-25 VITALS — BP 118/87 | HR 83

## 2014-03-25 DIAGNOSIS — M542 Cervicalgia: Secondary | ICD-10-CM

## 2014-03-25 DIAGNOSIS — Z981 Arthrodesis status: Secondary | ICD-10-CM

## 2014-03-25 DIAGNOSIS — M5412 Radiculopathy, cervical region: Secondary | ICD-10-CM

## 2014-03-25 MED ORDER — ONDANSETRON HCL 4 MG/2ML IJ SOLN
4.0000 mg | Freq: Once | INTRAMUSCULAR | Status: AC
  Administered 2014-03-25: 4 mg via INTRAMUSCULAR

## 2014-03-25 MED ORDER — DIAZEPAM 5 MG PO TABS
10.0000 mg | ORAL_TABLET | Freq: Once | ORAL | Status: AC
  Administered 2014-03-25: 10 mg via ORAL

## 2014-03-25 MED ORDER — IOHEXOL 300 MG/ML  SOLN
9.0000 mL | Freq: Once | INTRAMUSCULAR | Status: AC | PRN
  Administered 2014-03-25: 9 mL via INTRATHECAL

## 2014-03-25 MED ORDER — MEPERIDINE HCL 100 MG/ML IJ SOLN
75.0000 mg | Freq: Once | INTRAMUSCULAR | Status: AC
  Administered 2014-03-25: 75 mg via INTRAMUSCULAR

## 2014-03-25 NOTE — Progress Notes (Signed)
Pt states he has been off Wellbutrin since last Friday. Discharge instructions explained to pt.

## 2014-03-25 NOTE — Discharge Instructions (Signed)
Myelogram Discharge Instructions  1. Go home and rest quietly for the next 24 hours.  It is important to lie flat for the next 24 hours.  Get up only to go to the restroom.  You may lie in the bed or on a couch on your back, your stomach, your left side or your right side.  You may have one pillow under your head.  You may have pillows between your knees while you are on your side or under your knees while you are on your back.  2. DO NOT drive today.  Recline the seat as far back as it will go, while still wearing your seat belt, on the way home.  3. You may get up to go to the bathroom as needed.  You may sit up for 10 minutes to eat.  You may resume your normal diet and medications unless otherwise indicated.  Drink lots of extra fluids today and tomorrow.  4. The incidence of headache, nausea, or vomiting is about 5% (one in 20 patients).  If you develop a headache, lie flat and drink plenty of fluids until the headache goes away.  Caffeinated beverages may be helpful.  If you develop severe nausea and vomiting or a headache that does not go away with flat bed rest, call [phone].  5. You may resume normal activities after your 24 hours of bed rest is over; however, do not exert yourself strongly or do any heavy lifting tomorrow. If when you get up you have a headache when standing, go back to bed and force fluids for another 24 hours.  6. Call your physician for a follow-up appointment.  The results of your myelogram will be sent directly to your physician by the following day.  7. If you have any questions or if complications develop after you arrive home, please call [phone].  Discharge instructions have been explained to the patient.  The patient, or the person responsible for the patient, fully understands these instructions.      May resume Wellbutrin on March 26, 2014, after 1:00 pm.

## 2014-03-29 ENCOUNTER — Other Ambulatory Visit (INDEPENDENT_AMBULATORY_CARE_PROVIDER_SITE_OTHER): Payer: Medicare Other

## 2014-03-29 DIAGNOSIS — Z0000 Encounter for general adult medical examination without abnormal findings: Secondary | ICD-10-CM

## 2014-03-29 DIAGNOSIS — R972 Elevated prostate specific antigen [PSA]: Secondary | ICD-10-CM

## 2014-03-29 DIAGNOSIS — Z136 Encounter for screening for cardiovascular disorders: Secondary | ICD-10-CM

## 2014-03-29 DIAGNOSIS — R351 Nocturia: Secondary | ICD-10-CM

## 2014-03-29 LAB — CBC WITH DIFFERENTIAL/PLATELET
BASOS ABS: 0.1 10*3/uL (ref 0.0–0.1)
Basophils Relative: 0.8 % (ref 0.0–3.0)
EOS ABS: 0.4 10*3/uL (ref 0.0–0.7)
Eosinophils Relative: 5.1 % — ABNORMAL HIGH (ref 0.0–5.0)
HCT: 41 % (ref 39.0–52.0)
Hemoglobin: 13.4 g/dL (ref 13.0–17.0)
LYMPHS PCT: 19.8 % (ref 12.0–46.0)
Lymphs Abs: 1.6 10*3/uL (ref 0.7–4.0)
MCHC: 32.6 g/dL (ref 30.0–36.0)
MCV: 92.5 fl (ref 78.0–100.0)
MONOS PCT: 11.5 % (ref 3.0–12.0)
Monocytes Absolute: 0.9 10*3/uL (ref 0.1–1.0)
NEUTROS ABS: 5.1 10*3/uL (ref 1.4–7.7)
Neutrophils Relative %: 62.8 % (ref 43.0–77.0)
PLATELETS: 365 10*3/uL (ref 150.0–400.0)
RBC: 4.43 Mil/uL (ref 4.22–5.81)
RDW: 14.3 % (ref 11.5–15.5)
WBC: 8.1 10*3/uL (ref 4.0–10.5)

## 2014-03-29 LAB — PSA: PSA: 2.59 ng/mL (ref 0.10–4.00)

## 2014-03-29 LAB — URINALYSIS, ROUTINE W REFLEX MICROSCOPIC
Bilirubin Urine: NEGATIVE
HGB URINE DIPSTICK: NEGATIVE
Ketones, ur: NEGATIVE
LEUKOCYTES UA: NEGATIVE
Nitrite: NEGATIVE
SPECIFIC GRAVITY, URINE: 1.025 (ref 1.000–1.030)
Total Protein, Urine: NEGATIVE
URINE GLUCOSE: NEGATIVE
UROBILINOGEN UA: 0.2 (ref 0.0–1.0)
pH: 5.5 (ref 5.0–8.0)

## 2014-03-29 LAB — BASIC METABOLIC PANEL
BUN: 11 mg/dL (ref 6–23)
CO2: 29 mEq/L (ref 19–32)
Calcium: 9.8 mg/dL (ref 8.4–10.5)
Chloride: 99 mEq/L (ref 96–112)
Creatinine, Ser: 1.4 mg/dL (ref 0.4–1.5)
GFR: 69.6 mL/min (ref >60.00)
GLUCOSE: 109 mg/dL — AB (ref 70–99)
Potassium: 3.6 mEq/L (ref 3.5–5.1)
SODIUM: 136 meq/L (ref 135–145)

## 2014-03-29 LAB — TSH: TSH: 5.32 u[IU]/mL — AB (ref 0.35–4.50)

## 2014-03-29 LAB — HEPATIC FUNCTION PANEL
ALK PHOS: 95 U/L (ref 39–117)
ALT: 26 U/L (ref 0–53)
AST: 26 U/L (ref 0–37)
Albumin: 3.9 g/dL (ref 3.5–5.2)
Bilirubin, Direct: 0.1 mg/dL (ref 0.0–0.3)
TOTAL PROTEIN: 7 g/dL (ref 6.0–8.3)
Total Bilirubin: 0.4 mg/dL (ref 0.2–1.2)

## 2014-03-29 LAB — LIPID PANEL
CHOLESTEROL: 209 mg/dL — AB (ref 0–200)
HDL: 60.3 mg/dL (ref >39.00)
LDL Cholesterol: 102 mg/dL — ABNORMAL HIGH (ref 0–99)
NonHDL: 148.7
Total CHOL/HDL Ratio: 3
Triglycerides: 234 mg/dL — ABNORMAL HIGH (ref 0.0–149.0)
VLDL: 46.8 mg/dL — AB (ref 0.0–40.0)

## 2014-04-05 ENCOUNTER — Encounter: Payer: Self-pay | Admitting: Internal Medicine

## 2014-04-05 ENCOUNTER — Ambulatory Visit (INDEPENDENT_AMBULATORY_CARE_PROVIDER_SITE_OTHER): Payer: Medicare Other | Admitting: Internal Medicine

## 2014-04-05 VITALS — BP 120/80 | HR 87 | Temp 98.1°F | Ht 73.5 in | Wt 164.1 lb

## 2014-04-05 DIAGNOSIS — F4321 Adjustment disorder with depressed mood: Secondary | ICD-10-CM

## 2014-04-05 DIAGNOSIS — R972 Elevated prostate specific antigen [PSA]: Secondary | ICD-10-CM | POA: Insufficient documentation

## 2014-04-05 DIAGNOSIS — R351 Nocturia: Secondary | ICD-10-CM

## 2014-04-05 DIAGNOSIS — Z0000 Encounter for general adult medical examination without abnormal findings: Secondary | ICD-10-CM

## 2014-04-05 MED ORDER — DOXYCYCLINE HYCLATE 100 MG PO TABS: 100.0000 mg | ORAL_TABLET | Freq: Two times a day (BID) | ORAL | Status: DC

## 2014-04-05 NOTE — Assessment & Plan Note (Signed)
For f/u psa with next visit

## 2014-04-05 NOTE — Progress Notes (Signed)
Pre visit review using our clinic review tool, if applicable. No additional management support is needed unless otherwise documented below in the visit note. 

## 2014-04-05 NOTE — Progress Notes (Signed)
Subjective:    Patient ID: Mark Dunlap, male    DOB: Mar 22, 1961, 53 y.o.   MRN: [id number]  HPI Here for wellness and f/u;  Overall doing ok;  Pt denies CP, worsening SOB, DOE, wheezing, orthopnea, PND, worsening LE edema, palpitations, dizziness or syncope.  Pt denies neurological change such as new headache, facial or extremity weakness.  Pt denies polydipsia, polyuria, or low sugar symptoms. Pt states overall good compliance with treatment and medications, good tolerability, and has been trying to follow lower cholesterol diet.  Pt denies worsening depressive symptoms, suicidal ideation or panic, but has been more irritable with family issues. Mother at 5yo, he helps care for her, she says hurtful things. No fever, night sweats, wt loss, loss of appetite, or other constitutional symptoms.  Pt states good ability with ADL's, has low fall risk, home safety reviewed and adequate, no other significant changes in hearing or vision, and only occasionally active with exercise.  Taking hydrocodone prn again due to increased pain, ? Screw become loose.  Does have 2 mo new onset nocturia x 3-4 times, Denies urinary symptoms such as dysuria, frequency, urgency, flank pain, hematuria or n/v, fever, chills. Quit smoking, wt now up to 164 from 140 at lowest.  Past Medical History  Diagnosis Date  . THYROID NODULE, RIGHT 03/02/2010  . Adjustment disorder with depressed mood 09/2007  . 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 HYPOTENSION 10/31/2009  . Acute prostatitis 05/23/2009  . SPINAL STENOSIS, CERVICAL 08/08/2009  . H/O: substance abuse     hx of ETOH/Crack cocaine-none since 11/08 per pt/Does not Drive due to this  . DJD (degenerative joint disease) of knee     left knee  . DJD (degenerative joint disease) of hip   . HYPERTENSION 08/30/2008  . CERVICAL RADICULOPATHY, LEFT 08/30/2008  . Other and unspecified hyperlipidemia 08/20/2013  .  HYPERTHYROIDISM 02/13/2010    pt was told by  Dr Jenny Reichmann  that thyroid was now back to normal .. 2012 ...   . Seasonal allergies   . Legally blind in left eye, as defined in [place]     Blind Left eye, small amt vision Right eye   Past Surgical History  Procedure Laterality Date  . Right hand i&d  12/07    s/p rdue to abscess-Dr. Amedeo Plenty  . Anterior cervical decomp/discectomy fusion  10/11/2011    Procedure: ANTERIOR CERVICAL DECOMPRESSION/DISCECTOMY FUSION 3 LEVELS;  Surgeon: Eustace Moore;  Location: [facility];  Service: Neurosurgery;  Laterality: N/A;  Cervical three-four ,cervical four five cervical five six Anterior Cervical Decompression Fusion with peek + plate Nuvasive translational plate Orthofix peek (2 1/2 hours) Rm # 32  . Total hip arthroplasty  12/17/2011    Procedure: TOTAL HIP ARTHROPLASTY;  Surgeon: Johnny Bridge, MD;  Location: [facility];  Service: Orthopedics;  Laterality: Left;  . Posterior cervical fusion/foraminotomy N/A 09/23/2013    Procedure: CERVICALTWO TO CERVICAL SEVEN POSTERIOR CERVICAL FUSION/FORAMINOTOMY WITH LATERAL MASS FIXATION;  Surgeon: Eustace Moore, MD;  Location: [facility];  Service: Neurosurgery;  Laterality: N/A;    reports that he has been smoking Cigarettes.  He has a 40 pack-year smoking history. He does not have any smokeless tobacco history on file. He reports that he uses illicit drugs ("Crack" cocaine). He reports that he does not drink alcohol. family history includes Alcohol abuse in his brother, cousin, and father; Arthritis in his mother;  Diabetes in his brother and sister; Goiter in his mother and sister; Heart disease in his father and other; Hyperlipidemia in his mother; Hypertension in his sister; Stroke in his father. Allergies  Allergen Reactions  . Hydrocodone Itching    10 -325 makes him itch but 10-500 doesn't  . Morphine And Related Itching  . Tramadol Itching   Current Outpatient Prescriptions on File Prior to Visit  Medication Sig  Dispense Refill  . adapalene (DIFFERIN) 0.1 % gel Apply 1 application topically at bedtime.       [name] amLODipine (NORVASC) 2.5 MG tablet Take 1 tablet (2.5 mg total) by mouth daily.  90 tablet  3  . aspirin 81 MG tablet Take 81 mg by mouth daily.        . benzoyl peroxide 5 % cream Apply 1 application topically daily.      . bimatoprost (LUMIGAN) 0.01 % SOLN Place 1 drop into both eyes at bedtime.      [name] buPROPion (WELLBUTRIN SR) 100 MG 12 hr tablet Take 1 tablet (100 mg total) by mouth 2 (two) times daily.  90 tablet  3  . carboxymethylcellulose (REFRESH PLUS) 0.5 % SOLN Place 1 drop into both eyes 3 (three) times daily as needed (keep eyes clear).       . cetirizine (ZYRTEC) 10 MG tablet Take 10 mg by mouth daily as needed for allergies.       . Diclofenac Sodium (PENNSAID) 2 % SOLN Place 1 application onto the skin 2 (two) times daily as needed (pain).      . fluticasone (FLONASE) 50 MCG/ACT nasal spray Place 2 sprays into both nostrils daily.      . hydrochlorothiazide (HYDRODIURIL) 25 MG tablet Take 1 tablet (25 mg total) by mouth daily.  90 tablet  3  . methocarbamol (ROBAXIN) 500 MG tablet Take 1 tablet (500 mg total) by mouth every 6 (six) hours as needed for muscle spasms.  60 tablet  3  . minocycline (MINOCIN,DYNACIN) 100 MG capsule Take 100 mg by mouth 2 (two) times daily.       . Multiple Vitamins-Minerals (MULTIVITAMIN WITH MINERALS) tablet Take 1 tablet by mouth daily.      . ondansetron (ZOFRAN) 4 MG tablet TAKE ONE TABLET BY MOUTH EVERY 8 HOURS AS NEEDED FOR NAUSEA  40 tablet  0  . oxyCODONE-acetaminophen (PERCOCET/ROXICET) 5-325 MG per tablet Take 1-2 tablets by mouth every 4 (four) hours as needed for moderate pain.  60 tablet  0  . traZODone (DESYREL) 150 MG tablet Take 1 tablet (150 mg total) by mouth at bedtime.  90 tablet  1  . zolpidem (AMBIEN) 10 MG tablet TAKE 1 TABLET BY MOUTH AT BEDTIME AS NEEDED FOR SLEEP  30 tablet  3   No current facility-administered medications on  file prior to visit.    Review of Systems Constitutional: Negative for increased diaphoresis, other activity, appetite or other siginficant weight change  HENT: Negative for worsening hearing loss, ear pain, facial swelling, mouth sores and neck stiffness.   Eyes: Negative for other worsening pain, redness or visual disturbance.  Respiratory: Negative for shortness of breath and wheezing.   Cardiovascular: Negative for chest pain and palpitations.  Gastrointestinal: Negative for diarrhea, blood in stool, abdominal distention or other pain Genitourinary: Negative for hematuria, flank pain or change in urine volume.  Musculoskeletal: Negative for myalgias or other joint complaints.  Skin: Negative for color change and wound.  Neurological: Negative for syncope and numbness. other  than noted Hematological: Negative for adenopathy. or other swelling Psychiatric/Behavioral: Negative for hallucinations, self-injury, decreased concentration or other worsening agitation.      Objective:   Physical Exam BP 120/80  Pulse 87  Temp(Src) 98.1 F (36.7 C) (Oral)  Ht 6' 1.5" (1.867 m)  Wt 164 lb 2 oz (74.447 kg)  BMI 21.36 kg/m2  SpO2 95% VS noted,  Constitutional: Pt is oriented to person, place, and time. Appears well-developed and well-nourished.  Head: Normocephalic and atraumatic.  Right Ear: External ear normal.  Left Ear: External ear normal.  Nose: Nose normal.  Mouth/Throat: Oropharynx is clear and moist.  Eyes: Conjunctivae and EOM are normal. Pupils are equal, round, and reactive to light.  Neck: Normal range of motion. Neck supple. No JVD present. No tracheal deviation present.  Cardiovascular: Normal rate, regular rhythm, normal heart sounds and intact distal pulses.   Pulmonary/Chest: Effort normal and breath sounds without rales or wheezing  Abdominal: Soft. Bowel sounds are normal. NT. No HSM  Musculoskeletal: Normal range of motion. Exhibits no edema.  Lymphadenopathy:  Has  no cervical adenopathy.  Neurological: Pt is alert and oriented to person, place, and time. Pt has normal reflexes. No cranial nerve deficit. Motor grossly intact Skin: Skin is warm and dry. No rash noted.  Psychiatric:  Has mild nervous, irritable mood and affect. Behavior is normal.      Assessment & Plan:

## 2014-04-05 NOTE — Patient Instructions (Signed)
Please take all new medication as prescribed - the antibiotic  Please continue all other medications as before, and refills have been done if requested.  Please have the pharmacy call with any other refills you may need.  Please continue your efforts at being more active, low cholesterol diet, and weight control.  You are otherwise up to date with prevention measures today.  Please keep your appointments with your specialists as you may have planned  Please return in 3 months, or sooner if needed, with Lab testing done 3-5 days before - just the PSA with the next visit  You are given the work note today

## 2014-04-05 NOTE — Assessment & Plan Note (Signed)
Ongoing, irritable today, no SI or HI, no harmful intent, just no light at end of tunnel taking care of elderly mother;  Declines med or counseling referral

## 2014-04-05 NOTE — Assessment & Plan Note (Signed)
Suspect prostatitis vs BPH with elev PSA as well; for trial doxy course, re-check 3 mo, consider urology referral

## 2014-04-05 NOTE — Assessment & Plan Note (Signed)

## 2014-06-06 ENCOUNTER — Encounter (HOSPITAL_COMMUNITY): Payer: Self-pay | Admitting: Emergency Medicine

## 2014-06-06 ENCOUNTER — Emergency Department (HOSPITAL_COMMUNITY)
Admission: EM | Admit: 2014-06-06 | Discharge: 2014-06-06 | Disposition: A | Discharge status: Home / Self Care | Payer: Medicare Other | Attending: Emergency Medicine | Admitting: Emergency Medicine

## 2014-06-06 ENCOUNTER — Emergency Department (HOSPITAL_COMMUNITY): Payer: Medicare Other

## 2014-06-06 DIAGNOSIS — Z7982 Long term (current) use of aspirin: Secondary | ICD-10-CM | POA: Insufficient documentation

## 2014-06-06 DIAGNOSIS — Z8639 Personal history of other endocrine, nutritional and metabolic disease: Secondary | ICD-10-CM | POA: Insufficient documentation

## 2014-06-06 DIAGNOSIS — Z8739 Personal history of other diseases of the musculoskeletal system and connective tissue: Secondary | ICD-10-CM | POA: Diagnosis not present

## 2014-06-06 DIAGNOSIS — N289 Disorder of kidney and ureter, unspecified: Secondary | ICD-10-CM | POA: Insufficient documentation

## 2014-06-06 DIAGNOSIS — Z79899 Other long term (current) drug therapy: Secondary | ICD-10-CM | POA: Insufficient documentation

## 2014-06-06 DIAGNOSIS — R42 Dizziness and giddiness: Secondary | ICD-10-CM | POA: Diagnosis present

## 2014-06-06 DIAGNOSIS — Z791 Long term (current) use of non-steroidal anti-inflammatories (NSAID): Secondary | ICD-10-CM | POA: Insufficient documentation

## 2014-06-06 DIAGNOSIS — I10 Essential (primary) hypertension: Secondary | ICD-10-CM | POA: Insufficient documentation

## 2014-06-06 DIAGNOSIS — IMO0002 Reserved for concepts with insufficient information to code with codable children: Secondary | ICD-10-CM | POA: Diagnosis not present

## 2014-06-06 DIAGNOSIS — Z862 Personal history of diseases of the blood and blood-forming organs and certain disorders involving the immune mechanism: Secondary | ICD-10-CM | POA: Diagnosis not present

## 2014-06-06 DIAGNOSIS — H548 Legal blindness, as defined in USA: Secondary | ICD-10-CM | POA: Diagnosis not present

## 2014-06-06 DIAGNOSIS — I959 Hypotension, unspecified: Secondary | ICD-10-CM

## 2014-06-06 DIAGNOSIS — Z792 Long term (current) use of antibiotics: Secondary | ICD-10-CM | POA: Insufficient documentation

## 2014-06-06 DIAGNOSIS — H4050X0 Glaucoma secondary to other eye disorders, unspecified eye, stage unspecified: Secondary | ICD-10-CM | POA: Diagnosis not present

## 2014-06-06 DIAGNOSIS — F17200 Nicotine dependence, unspecified, uncomplicated: Secondary | ICD-10-CM | POA: Diagnosis not present

## 2014-06-06 DIAGNOSIS — E0590 Thyrotoxicosis, unspecified without thyrotoxic crisis or storm: Secondary | ICD-10-CM | POA: Diagnosis not present

## 2014-06-06 LAB — CBC WITH DIFFERENTIAL/PLATELET
Basophils Absolute: 0 10*3/uL (ref 0.0–0.1)
Basophils Relative: 1 % (ref 0–1)
EOS ABS: 0.4 10*3/uL (ref 0.0–0.7)
EOS PCT: 5 % (ref 0–5)
HEMATOCRIT: 41.4 % (ref 39.0–52.0)
Hemoglobin: 13.8 g/dL (ref 13.0–17.0)
LYMPHS ABS: 1.6 10*3/uL (ref 0.7–4.0)
LYMPHS PCT: 20 % (ref 12–46)
MCH: 29.9 pg (ref 26.0–34.0)
MCHC: 33.3 g/dL (ref 30.0–36.0)
MCV: 89.8 fL (ref 78.0–100.0)
MONO ABS: 0.8 10*3/uL (ref 0.1–1.0)
Monocytes Relative: 10 % (ref 3–12)
Neutro Abs: 5.1 10*3/uL (ref 1.7–7.7)
Neutrophils Relative %: 64 % (ref 43–77)
Platelets: 373 10*3/uL (ref 150–400)
RBC: 4.61 MIL/uL (ref 4.22–5.81)
RDW: 13.3 % (ref 11.5–15.5)
WBC: 7.9 10*3/uL (ref 4.0–10.5)

## 2014-06-06 LAB — COMPREHENSIVE METABOLIC PANEL
ALT: 14 U/L (ref 0–53)
AST: 17 U/L (ref 0–37)
Albumin: 3.6 g/dL (ref 3.5–5.2)
Alkaline Phosphatase: 112 U/L (ref 39–117)
Anion gap: 14 (ref 5–15)
BUN: 15 mg/dL (ref 6–23)
CALCIUM: 9.8 mg/dL (ref 8.4–10.5)
CO2: 25 mEq/L (ref 19–32)
CREATININE: 1.51 mg/dL — AB (ref 0.50–1.35)
Chloride: 94 mEq/L — ABNORMAL LOW (ref 96–112)
GFR calc non Af Amer: 51 mL/min — ABNORMAL LOW (ref >90)
GFR, EST AFRICAN AMERICAN: 59 mL/min — AB (ref >90)
GLUCOSE: 93 mg/dL (ref 70–99)
Potassium: 4.2 mEq/L (ref 3.7–5.3)
Sodium: 133 mEq/L — ABNORMAL LOW (ref 137–147)
TOTAL PROTEIN: 7.6 g/dL (ref 6.0–8.3)
Total Bilirubin: 0.3 mg/dL (ref 0.3–1.2)

## 2014-06-06 LAB — CBG MONITORING, ED: Glucose-Capillary: 86 mg/dL (ref 70–99)

## 2014-06-06 LAB — PROTIME-INR
INR: 0.95 (ref 0.00–1.49)
PROTHROMBIN TIME: 12.7 s (ref 11.6–15.2)

## 2014-06-06 LAB — I-STAT TROPONIN, ED: Troponin i, poc: 0.01 ng/mL (ref 0.00–0.08)

## 2014-06-06 MED ORDER — SODIUM CHLORIDE 0.9 % IV SOLN
1000.0000 mL | INTRAVENOUS | Status: DC
  Administered 2014-06-06: 1000 mL via INTRAVENOUS

## 2014-06-06 MED ORDER — SODIUM CHLORIDE 0.9 % IV SOLN
1000.0000 mL | Freq: Once | INTRAVENOUS | Status: AC
  Administered 2014-06-06: 1000 mL via INTRAVENOUS

## 2014-06-06 NOTE — ED Notes (Addendum)
Pt reports feeling dizzy and feeling like his throat was tightening, this morning.  Sts BP taken at work 79/59 and 80/59.  Hx of HTN.  Reports no medication changes.  Sts this happened previously, but he doesn't remember the diagnoses.

## 2014-06-06 NOTE — Discharge Instructions (Signed)
Hypotension  As your heart beats, it forces blood through your arteries. This force is your blood pressure. If your blood pressure is too low for you to go about your normal activities or to support the organs of your body, you have hypotension. Hypotension is also referred to as low blood pressure. When your blood pressure becomes too low, you may not get enough blood to your brain. As a result, you may feel weak, feel lightheaded, or develop a rapid heart rate. In a more severe case, you may faint.  CAUSES  Various conditions can cause hypotension. These include:  · Blood loss.  · Dehydration.  · Heart or endocrine problems.  · Pregnancy.  · Severe infection.  · Not having a well-balanced diet filled with needed nutrients.  · Severe allergic reactions (anaphylaxis).  Some medicines, such as blood pressure medicine or water pills (diuretics), may lower your blood pressure below normal. Sometimes taking too much medicine or taking medicine not as directed can cause hypotension.  TREATMENT   Hospitalization is sometimes required for hypotension if fluid or blood replacement is needed, if time is needed for medicines to wear off, or if further monitoring is needed. Treatment might include changing your diet, changing your medicines (including medicines aimed at raising your blood pressure), and use of support stockings.  HOME CARE INSTRUCTIONS   · Drink enough fluids to keep your urine clear or pale yellow.  · Take your medicines as directed by your health care provider.  · Get up slowly from reclining or sitting positions. This gives your blood pressure a chance to adjust.  · Wear support stockings as directed by your health care provider.  · Maintain a healthy diet by including nutritious food, such as fruits, vegetables, nuts, whole grains, and lean meats.  SEEK MEDICAL CARE IF:  · You have vomiting or diarrhea.  · You have a fever for more than 2-3 days.  · You feel more thirsty than usual.  · You feel weak and  tired.  SEEK IMMEDIATE MEDICAL CARE IF:   · You have chest pain or a fast or irregular heartbeat.  · You have a loss of feeling in some part of your body, or you lose movement in your arms or legs.  · You have trouble speaking.  · You become sweaty or feel lightheaded.  · You faint.  MAKE SURE YOU:   · Understand these instructions.  · Will watch your condition.  · Will get help right away if you are not doing well or get worse.  Document Released: 09/30/2005 Document Revised: 07/21/2013 Document Reviewed: 04/02/2013  Exit[employer]® Patient Information ©2015 [employer]. This information is not intended to replace advice given to you by your health care provider. Make sure you discuss any questions you have with your health care provider.

## 2014-06-06 NOTE — ED Notes (Signed)
Pt escorted to discharge window. Verbalized understanding discharge instructions. In no acute distress.   

## 2014-06-06 NOTE — ED Provider Notes (Signed)
CSN: [id number]     Arrival date & time 06/06/14  0900 History   First MD Initiated Contact with Patient 06/06/14 0915     Chief Complaint  Patient presents with  . Dizziness  . Hypotension    HPI Patient presents to the emergency room with complaints dizziness and lightheadedness. The patient woke up this morning and was having episodes of feeling very lightheaded and nauseated. He also has sensation of something being stuck in his throat. The symptoms were increased whenever he was standing and got better when he tried sitting or squatting. His mother suggested he stay home from work the patient felt like he needs to go in. He did take his blood pressure medications this morning. The patient went to work and while there his lightheadedness persisted. He had a few episodes where he had to squat down because he felt like he was going to pass out. Coworkers took his blood pressure and he was told it was in the 71I or 96V systolic. Patient suddenly coming to the emergency room. He denies any chest pain or shortness of breath. Denies any vomiting or diarrhea. He has not noticed any blood in his stool. He denies having any pain anywhere.  Patient does take medications for blood pressure. He was told he was supposed to discontinue his lisinopril tablet and start taking hydrochlorothiazide. He says he noticed there was a recall on that medication so he stopped taking hydrochlorothiazide and started his lisinopril again. He has been doing that for the last several months. Past Medical History  Diagnosis Date  . THYROID NODULE, RIGHT 03/02/2010  . Adjustment disorder with depressed mood 09/2007  . 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 HYPOTENSION 10/31/2009  . Acute prostatitis 05/23/2009  . SPINAL STENOSIS, CERVICAL 08/08/2009  . H/O: substance abuse     hx of ETOH/Crack cocaine-none since 11/08 per pt/Does not Drive due to this  . DJD (degenerative  joint disease) of knee     left knee  . DJD (degenerative joint disease) of hip   . HYPERTENSION 08/30/2008  . CERVICAL RADICULOPATHY, LEFT 08/30/2008  . Other and unspecified hyperlipidemia 08/20/2013  . HYPERTHYROIDISM 02/13/2010    pt was told by  Dr Jenny Reichmann  that thyroid was now back to normal .. 2012 ...   . Seasonal allergies   . Legally blind in left eye, as defined in [place]     Blind Left eye, small amt vision Right eye   Past Surgical History  Procedure Laterality Date  . Right hand i&d  12/07    s/p rdue to abscess-Dr. Amedeo Plenty  . Anterior cervical decomp/discectomy fusion  10/11/2011    Procedure: ANTERIOR CERVICAL DECOMPRESSION/DISCECTOMY FUSION 3 LEVELS;  Surgeon: Eustace Moore;  Location: [facility];  Service: Neurosurgery;  Laterality: N/A;  Cervical three-four ,cervical four five cervical five six Anterior Cervical Decompression Fusion with peek + plate Nuvasive translational plate Orthofix peek (2 1/2 hours) Rm # 32  . Total hip arthroplasty  12/17/2011    Procedure: TOTAL HIP ARTHROPLASTY;  Surgeon: Johnny Bridge, MD;  Location: [facility];  Service: Orthopedics;  Laterality: Left;  . Posterior cervical fusion/foraminotomy N/A 09/23/2013    Procedure: CERVICALTWO TO CERVICAL SEVEN POSTERIOR CERVICAL FUSION/FORAMINOTOMY WITH LATERAL MASS FIXATION;  Surgeon: Eustace Moore, MD;  Location: [facility];  Service: Neurosurgery;  Laterality: N/A;   Family History  Problem Relation Age of Onset  .  Arthritis Mother   . Hyperlipidemia Mother   . Goiter Mother     resection of benign goiter  . Alcohol abuse Father   . Stroke Father   . Heart disease Father   . Hypertension Sister   . Diabetes Sister   . Goiter Sister     resection of benign goiter  . Diabetes Brother   . Alcohol abuse Brother   . Alcohol abuse Cousin   . Heart disease Other     Aunt   History  Substance Use Topics  . Smoking status: Current Every Day Smoker -- 1.00 packs/day for 40 years    Types:  Cigarettes  . Smokeless tobacco: Not on file     Comment: last crack over 1 yr   alcohol last time few yrs  . Alcohol Use: No     Comment: hx of alcohol abuse    Review of Systems  All other systems reviewed and are negative.     Allergies  Hydrocodone; Morphine and related; and Tramadol  Home Medications   Prior to Admission medications   Medication Sig Start Date End Date Taking? Authorizing Provider  adapalene (DIFFERIN) 0.1 % gel Apply 1 application topically at bedtime.  07/18/13  Yes Historical Provider, MD  aspirin 81 MG tablet Take 81 mg by mouth daily.     Yes Historical Provider, MD  benzoyl peroxide 5 % cream Apply 1 application topically daily.   Yes Historical Provider, MD  bimatoprost (LUMIGAN) 0.01 % SOLN Place 1 drop into both eyes at bedtime.   Yes Historical Provider, MD  carboxymethylcellulose (REFRESH PLUS) 0.5 % SOLN Place 1 drop into both eyes 3 (three) times daily as needed (keep eyes clear).    Yes Historical Provider, MD  Diclofenac Sodium (PENNSAID) 2 % SOLN Place 1 application onto the skin 2 (two) times daily as needed (pain).   Yes Historical Provider, MD  diphenhydrAMINE (SOMINEX) 25 MG tablet Take 100 mg by mouth once as needed for itching or sleep. Takes with hydrocodone.   Yes Historical Provider, MD  fluticasone (FLONASE) 50 MCG/ACT nasal spray Place 2 sprays into both nostrils daily.   Yes Historical Provider, MD  hydrochlorothiazide (HYDRODIURIL) 25 MG tablet Take 1 tablet (25 mg total) by mouth daily. 12/31/13  Yes Biagio Borg, MD  HYDROcodone-acetaminophen [facility]) 10-325 MG per tablet Take 1 tablet by mouth every 6 (six) hours as needed for moderate pain.   Yes Historical Provider, MD  methocarbamol (ROBAXIN) 500 MG tablet Take 1 tablet (500 mg total) by mouth every 6 (six) hours as needed for muscle spasms. 09/25/13  Yes Kristeen Miss, MD  minocycline (MINOCIN,DYNACIN) 100 MG capsule Take 100 mg by mouth 2 (two) times daily.  07/18/13  Yes Historical  Provider, MD  Multiple Vitamins-Minerals (MULTIVITAMIN WITH MINERALS) tablet Take 1 tablet by mouth daily.   Yes Historical Provider, MD  ondansetron (ZOFRAN) 4 MG tablet Take 4 mg by mouth every 8 (eight) hours as needed for nausea or vomiting.   Yes Historical Provider, MD  traZODone (DESYREL) 150 MG tablet Take 1 tablet (150 mg total) by mouth at bedtime. 06/15/13  Yes Biagio Borg, MD  zolpidem (AMBIEN) 10 MG tablet Take 10 mg by mouth at bedtime as needed for sleep.   Yes Historical Provider, MD   BP 123/77  Pulse 71  Temp(Src) 98 F (36.7 C) (Oral)  Resp 20  SpO2 98% Physical Exam  Nursing note and vitals reviewed. Constitutional: He appears well-developed and  well-nourished. No distress.  HENT:  Head: Normocephalic and atraumatic.  Right Ear: External ear normal.  Left Ear: External ear normal.  Eyes: Conjunctivae are normal. Right eye exhibits no discharge. Left eye exhibits no discharge. No scleral icterus.  Neck: Neck supple. No tracheal deviation present.  Cardiovascular: Normal rate, regular rhythm and intact distal pulses.   Pulmonary/Chest: Effort normal and breath sounds normal. No stridor. No respiratory distress. He has no wheezes. He has no rales.  Abdominal: Soft. Bowel sounds are normal. He exhibits no distension. There is no tenderness. There is no rebound and no guarding.  Musculoskeletal: He exhibits no edema and no tenderness.  Neurological: He is alert. He has normal strength. No cranial nerve deficit (no facial droop, extraocular movements intact, no slurred speech) or sensory deficit. He exhibits normal muscle tone. He displays no seizure activity. Coordination normal.  Skin: Skin is warm and dry. No rash noted.  Psychiatric: He has a normal mood and affect.    ED Course  Procedures (including critical care time) Labs Review Labs Reviewed  COMPREHENSIVE METABOLIC PANEL - Abnormal; Notable for the following:    Sodium 133 (*)    Chloride 94 (*)     Creatinine, Ser 1.51 (*)    GFR calc non Af Amer 51 (*)    GFR calc Af Amer 59 (*)    All other components within normal limits  CBC WITH DIFFERENTIAL  PROTIME-INR  POCT CBG (FASTING - GLUCOSE)-MANUAL ENTRY  I-STAT TROPOININ, ED  CBG MONITORING, ED    Imaging Review Dg Chest 2 View  06/06/2014   CLINICAL DATA:  Smoking, cough, congestion, dizziness, recent URI, history hypertension, smoker  EXAM: CHEST  2 VIEW  COMPARISON:  08/18/2013  FINDINGS: Normal heart size, mediastinal contours, and pulmonary vascularity.  Stable partially loculated pleural effusion versus scarring at posterolateral RIGHT base.  Underlying emphysematous and bronchitic changes.  Chronic RIGHT basilar atelectasis.  No new infiltrate, pleural effusion or pneumothorax.  Prior cervical spine fusion.  Bones unremarkable.  IMPRESSION: Chronic RIGHT basilar changes and underlying COPD.  No acute abnormalities.   Electronically Signed   By: Lavonia Dana M.D.   On: 06/06/2014 09:55     EKG Interpretation   Date/Time:  Monday June 06 2014 09:23:47 EDT Ventricular Rate:  68 PR Interval:  192 QRS Duration: 111 QT Interval:  386 QTC Calculation: 410 R Axis:   -12 Text Interpretation:  Sinus rhythm Nonspecific T abnormalities, inferior  leads and lateral leads ; changed since prior tracing Nov 2014 Confirmed  by [facility]  MD-J, Bailee Thall [phone]) on 06/06/2014 9:39:08 AM      MDM   Final diagnoses:  Transient hypotension  Renal insufficiency    Patient had an episode of hypotension at work. this could be related to the fact that he started taking his lisinopril again. I reviewed the patient's old laboratory tests. His creatinine was 1.4 in June. Prior to that it was 1.05.  It is possible that his primary doctor had him discontinue lisinopril for that reason.  I instructed the patient to stop taking his lisinopril. He will continue on his amlodipine. I asked him to contact his primary doctor to followup later this week to review  his blood pressure. Patient will monitor for worsening symptoms, fever, vomiting, blood in stool.  Dorie Rank, MD 06/06/14 1101

## 2014-06-06 NOTE — ED Notes (Signed)
Patient transported to X-ray 

## 2014-06-06 NOTE — ED Notes (Addendum)
Pt c/o dizziness that started this morning, BP took at work as was told it was low. Denies pain. Pt has HTN and took BP meds this morning.

## 2014-06-08 ENCOUNTER — Encounter: Payer: Self-pay | Admitting: Internal Medicine

## 2014-06-08 ENCOUNTER — Ambulatory Visit (INDEPENDENT_AMBULATORY_CARE_PROVIDER_SITE_OTHER): Payer: Medicare Other | Admitting: Internal Medicine

## 2014-06-08 VITALS — BP 110/80 | HR 87 | Temp 98.3°F | Wt 164.2 lb

## 2014-06-08 DIAGNOSIS — I10 Essential (primary) hypertension: Secondary | ICD-10-CM

## 2014-06-08 MED ORDER — ZOLPIDEM TARTRATE 10 MG PO TABS: 10.0000 mg | ORAL_TABLET | Freq: Every evening | ORAL | Status: DC | PRN

## 2014-06-08 MED ORDER — ADAPALENE 0.1 % EX GEL: 1.0000 "application " | Freq: Every day | CUTANEOUS | Status: DC

## 2014-06-08 MED ORDER — MINOCYCLINE HCL 100 MG PO CAPS
100.0000 mg | ORAL_CAPSULE | Freq: Two times a day (BID) | ORAL | Status: DC
Start: 2014-06-08 — End: 2015-04-18

## 2014-06-08 MED ORDER — BENZOYL PEROXIDE 2.5 % EX CREA: TOPICAL_CREAM | CUTANEOUS | Status: DC

## 2014-06-08 NOTE — Patient Instructions (Signed)
OK to stay off blood pressure medication for now  Please continue all other medications as before, and refills have been done if requested.  Please have the pharmacy call with any other refills you may need.  Please keep your appointments with your specialists as you may have planned

## 2014-06-08 NOTE — Progress Notes (Signed)
Pre visit review using our clinic review tool, if applicable. No additional management support is needed unless otherwise documented below in the visit note. 

## 2014-06-08 NOTE — Progress Notes (Signed)
Subjective:    Patient ID: Mark Dunlap, male    DOB: 1961-07-13, 53 y.o.   MRN: [id number]  HPI  Pt denies chest pain, increased sob or doe, wheezing, orthopnea, PND, increased LE swelling, palpitations, or syncope, but with dizziness, weakness on the HCT, better since stopped last wk, felt better since then.  Pt denies new neurological symptoms such as new headache, or facial or extremity weakness or numbness   Pt denies polydipsia, polyuria,. Past Medical History  Diagnosis Date  . THYROID NODULE, RIGHT 03/02/2010  . Adjustment disorder with depressed mood 09/2007  . 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 HYPOTENSION 10/31/2009  . Acute prostatitis 05/23/2009  . SPINAL STENOSIS, CERVICAL 08/08/2009  . H/O: substance abuse     hx of ETOH/Crack cocaine-none since 11/08 per pt/Does not Drive due to this  . DJD (degenerative joint disease) of knee     left knee  . DJD (degenerative joint disease) of hip   . HYPERTENSION 08/30/2008  . CERVICAL RADICULOPATHY, LEFT 08/30/2008  . Other and unspecified hyperlipidemia 08/20/2013  . HYPERTHYROIDISM 02/13/2010    pt was told by  Dr Jenny Reichmann  that thyroid was now back to normal .. 2012 ...   . Seasonal allergies   . Legally blind in left eye, as defined in [place]     Blind Left eye, small amt vision Right eye   Past Surgical History  Procedure Laterality Date  . Right hand i&d  12/07    s/p rdue to abscess-Dr. Amedeo Plenty  . Anterior cervical decomp/discectomy fusion  10/11/2011    Procedure: ANTERIOR CERVICAL DECOMPRESSION/DISCECTOMY FUSION 3 LEVELS;  Surgeon: Eustace Moore;  Location: [facility];  Service: Neurosurgery;  Laterality: N/A;  Cervical three-four ,cervical four five cervical five six Anterior Cervical Decompression Fusion with peek + plate Nuvasive translational plate Orthofix peek (2 1/2 hours) Rm # 32  . Total hip arthroplasty  12/17/2011    Procedure: TOTAL HIP ARTHROPLASTY;   Surgeon: Johnny Bridge, MD;  Location: [facility];  Service: Orthopedics;  Laterality: Left;  . Posterior cervical fusion/foraminotomy N/A 09/23/2013    Procedure: CERVICALTWO TO CERVICAL SEVEN POSTERIOR CERVICAL FUSION/FORAMINOTOMY WITH LATERAL MASS FIXATION;  Surgeon: Eustace Moore, MD;  Location: [facility];  Service: Neurosurgery;  Laterality: N/A;    reports that he has been smoking Cigarettes.  He has a 40 pack-year smoking history. He does not have any smokeless tobacco history on file. He reports that he uses illicit drugs ("Crack" cocaine). He reports that he does not drink alcohol. family history includes Alcohol abuse in his brother, cousin, and father; Arthritis in his mother; Diabetes in his brother and sister; Goiter in his mother and sister; Heart disease in his father and other; Hyperlipidemia in his mother; Hypertension in his sister; Stroke in his father. Allergies  Allergen Reactions  . Hydrocodone Itching    10 -325 makes him itch but 10-500 doesn't  . Morphine And Related Itching  . Tramadol Itching   Current Outpatient Prescriptions on File Prior to Visit  Medication Sig Dispense Refill  . aspirin 81 MG tablet Take 81 mg by mouth daily.        . bimatoprost (LUMIGAN) 0.01 % SOLN Place 1 drop into both eyes at bedtime.      . carboxymethylcellulose (REFRESH PLUS) 0.5 % SOLN Place 1 drop into both eyes 3 (three) times daily as needed (keep eyes clear).       [name]  Diclofenac Sodium (PENNSAID) 2 % SOLN Place 1 application onto the skin 2 (two) times daily as needed (pain).      [name] diphenhydrAMINE (SOMINEX) 25 MG tablet Take 100 mg by mouth once as needed for itching or sleep. Takes with hydrocodone.      . fluticasone (FLONASE) 50 MCG/ACT nasal spray Place 2 sprays into both nostrils daily.      [name] HYDROcodone-acetaminophen (NORCO) 10-325 MG per tablet Take 1 tablet by mouth every 6 (six) hours as needed for moderate pain.      . methocarbamol (ROBAXIN) 500 MG tablet Take 1 tablet  (500 mg total) by mouth every 6 (six) hours as needed for muscle spasms.  60 tablet  3  . Multiple Vitamins-Minerals (MULTIVITAMIN WITH MINERALS) tablet Take 1 tablet by mouth daily.      . ondansetron (ZOFRAN) 4 MG tablet Take 4 mg by mouth every 8 (eight) hours as needed for nausea or vomiting.      . traZODone (DESYREL) 150 MG tablet Take 1 tablet (150 mg total) by mouth at bedtime.  90 tablet  1   No current facility-administered medications on file prior to visit.   Review of Systems All otherwise neg per pt     Objective:   Physical Exam BP 110/80  Pulse 87  Temp(Src) 98.3 F (36.8 C) (Oral)  Wt 164 lb 4 oz (74.503 kg)  SpO2 96% VS noted,  Constitutional: Pt appears well-developed, well-nourished.  HENT: Head: NCAT.  Right Ear: External ear normal.  Left Ear: External ear normal.  Eyes: . Pupils are equal, round, and reactive to light. Conjunctivae and EOM are normal Neck: Normal range of motion. Neck supple.  Cardiovascular: Normal rate and regular rhythm.   Pulmonary/Chest: Effort normal and breath sounds normal.  Neurological: Pt is alert. Not confused , motor grossly intact Skin: Skin is warm. No rash Psychiatric: Pt behavior is normal. No agitation.     Assessment & Plan:

## 2014-06-10 ENCOUNTER — Telehealth: Payer: Self-pay

## 2014-06-10 ENCOUNTER — Encounter: Payer: Self-pay | Admitting: Internal Medicine

## 2014-06-10 ENCOUNTER — Ambulatory Visit (INDEPENDENT_AMBULATORY_CARE_PROVIDER_SITE_OTHER): Payer: Medicare Other | Admitting: Internal Medicine

## 2014-06-10 VITALS — BP 118/80 | HR 85 | Temp 97.8°F | Wt 162.2 lb

## 2014-06-10 DIAGNOSIS — T63461S Toxic effect of venom of wasps, accidental (unintentional), sequela: Secondary | ICD-10-CM

## 2014-06-10 DIAGNOSIS — T6591XS Toxic effect of unspecified substance, accidental (unintentional), sequela: Secondary | ICD-10-CM

## 2014-06-10 MED ORDER — PREDNISONE 10 MG PO TABS: ORAL_TABLET | ORAL | Status: DC

## 2014-06-10 MED ORDER — BENZOYL PEROXIDE 5.5 % EX CREA: TOPICAL_CREAM | CUTANEOUS | Status: DC

## 2014-06-10 NOTE — Progress Notes (Signed)
   Subjective:    Patient ID: Mark Dunlap, male    DOB: [birth year], 53 y.o.   MRN: [id number]  HPI    06/09/14 he had entered a bus stop inclosure & was attacked  by a swarm of wasps. He was stung at least 10 times over the left face and left hand. There was immediate swelling but no airway compromise.  He was checked by the paramedics ; vital signs  & blood pressure were normal. He was encouraged to go to the emergency room but he did not.  He has not treated this but the swelling has receded over the last 24 hours.  He was seen in the emergency room 8/24 for hypotension with a blood pressure of79/59. This resolved with discontinuation of a blood pressure medicine.    Review of Systems  No associated itchy, watery eyes.  Swelling of the lips or tongue denied.  Shortness of breath, wheezing, or cough absent.  No rash or urticaria noted.  Fever ,chills , or sweats denied. Purulence absent.  Diarrhea not present.     Objective:   Physical Exam Positive or pertinent physical findings: Left facial swelling is present especially in the periorbital area He has arcus senilis bilaterally. He describes blindness on the left related to glaucoma. There is resting exotropia of the left eye Has an upper plate &  lower partial. He has splotchy decreased pigmentation of the inner lower lip. There is swelling of the left hand.  General appearance:Thin but in good health ;well nourished; no acute distress or increased work of breathing is present.  No  lymphadenopathy about the head, neck, or axilla noted.  Eyes: No conjunctival inflammation or lid edema is present. There is no scleral icterus. Ears:  External ear exam shows no significant lesions or deformities.  Otoscopic examination reveals clear canals, tympanic membranes are intact bilaterally without bulging, retraction, inflammation or discharge. Nose:  External nasal examination shows no deformity or inflammation. Nasal mucosa  are pink and moist without lesions or exudates. No septal dislocation or deviation.No obstruction to airflow.  Oral exam: lips and gums are healthy appearing.There is no oropharyngeal erythema or exudate noted.  Neck:  No deformities, thyromegaly, masses, or tenderness noted.   Supple with full range of motion without pain. No UAO with hyperventilation. Heart:  Normal rate and regular rhythm. S1 and S2 normal without gallop, murmur, click, rub or other extra sounds.  Lungs:Chest clear to auscultation; no wheezes, rhonchi,rales ,or rubs present.No increased work of breathing.   Extremities:  No cyanosis, edema, or clubbing  noted  Skin: Warm & dry w/orash or urticaria.        Assessment & Plan:  #1 Wasp stings with facial and left hand swelling. No airway compromise clinically  Plan: See orders and recommendations

## 2014-06-10 NOTE — Telephone Encounter (Signed)
The patient states the Benzoyl peroxide 2.5 % needs to be changed to 5%.  Please advise

## 2014-06-10 NOTE — Patient Instructions (Addendum)
Use cool moist compresses 3 times a day to the swollen area. Benadryl @ bedtime nightly until swelling gone.

## 2014-06-10 NOTE — Telephone Encounter (Signed)
Notified pt md change cream has been sent to pharmacy...Mark Dunlap

## 2014-06-10 NOTE — Progress Notes (Signed)
Pre visit review using our clinic review tool, if applicable. No additional management support is needed unless otherwise documented below in the visit note. 

## 2014-06-13 NOTE — Assessment & Plan Note (Signed)
Some improved overall with diet and wt control, ok for d/c hct and continue to monitor BP at home and next visit BP Readings from Last 3 Encounters:  06/10/14 118/80  06/08/14 110/80  06/06/14 127/83

## 2014-06-27 ENCOUNTER — Other Ambulatory Visit: Payer: Self-pay | Admitting: Internal Medicine

## 2014-07-06 ENCOUNTER — Ambulatory Visit (INDEPENDENT_AMBULATORY_CARE_PROVIDER_SITE_OTHER): Payer: Medicare Other | Admitting: Internal Medicine

## 2014-07-06 ENCOUNTER — Encounter: Payer: Self-pay | Admitting: Internal Medicine

## 2014-07-06 VITALS — BP 142/100 | HR 82 | Temp 98.2°F | Wt 171.5 lb

## 2014-07-06 DIAGNOSIS — I10 Essential (primary) hypertension: Secondary | ICD-10-CM

## 2014-07-06 DIAGNOSIS — Z23 Encounter for immunization: Secondary | ICD-10-CM

## 2014-07-06 MED ORDER — AMLODIPINE BESYLATE 5 MG PO TABS: 5.0000 mg | ORAL_TABLET | Freq: Every day | ORAL | Status: AC

## 2014-07-06 NOTE — Assessment & Plan Note (Signed)
Uncontrolled with evidecne for orthostasis with recent hct use, to stop all current med, to start amlod 5 qd, f/u 4 wks

## 2014-07-06 NOTE — Patient Instructions (Signed)
Please take all new medication as prescribed - the amlodipine 5 mg for blood pressure  OK to stay off the HCT and the lisinopril HCT  Please continue all other medications as before, and refills have been done if requested.  Please have the pharmacy call with any other refills you may need.  Please continue your efforts at being more active, low cholesterol diet, and weight control.  You are otherwise up to date with prevention measures today.  Please keep your appointments with your specialists as you may have planned - surgury on Monday - Dr Ronnald Ramp

## 2014-07-06 NOTE — Progress Notes (Signed)
Pre visit review using our clinic review tool, if applicable. No additional management support is needed unless otherwise documented below in the visit note. 

## 2014-07-06 NOTE — Progress Notes (Signed)
Subjective:    Patient ID: Mark Dunlap, male    DOB: August 14, 1961, 53 y.o.   MRN: [id number]  HPI  F/u ER visit aug 24 with dizziness, as 3 mo f/u; has slight increased cr to 1.5 that day, seen after that here 2 days later off HCT himself, BP still low, kept off all antiHTN meds, except he now taking lisinopril -HCT for several days as BP has gone up.  Checks BP with his machine at home, but difficult due to low vision, usually gets help with family.  Due for spine surgury likely soon, to see surgury next Monday - Dr Hassel Neth.  Pt denies chest pain, increased sob or doe, wheezing, orthopnea, PND, increased LE swelling, palpitations, dizziness or syncope.  Pt denies new neurological symptoms such as new headache, or facial or extremity weakness or numbness Past Medical History  Diagnosis Date  . THYROID NODULE, RIGHT 03/02/2010  . Adjustment disorder with depressed mood 09/2007  . 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 HYPOTENSION 10/31/2009  . Acute prostatitis 05/23/2009  . SPINAL STENOSIS, CERVICAL 08/08/2009  . H/O: substance abuse     hx of ETOH/Crack cocaine-none since 11/08 per pt/Does not Drive due to this  . DJD (degenerative joint disease) of knee     left knee  . DJD (degenerative joint disease) of hip   . HYPERTENSION 08/30/2008  . CERVICAL RADICULOPATHY, LEFT 08/30/2008  . Other and unspecified hyperlipidemia 08/20/2013  . HYPERTHYROIDISM 02/13/2010    pt was told by  Dr Jenny Reichmann  that thyroid was now back to normal .. 2012 ...   . Seasonal allergies   . Legally blind in left eye, as defined in [place]     Blind Left eye, small amt vision Right eye   Past Surgical History  Procedure Laterality Date  . Right hand i&d  12/07    s/p rdue to abscess-Dr. Amedeo Plenty  . Anterior cervical decomp/discectomy fusion  10/11/2011    Procedure: ANTERIOR CERVICAL DECOMPRESSION/DISCECTOMY FUSION 3 LEVELS;  Surgeon: Eustace Moore;  Location: [facility];  Service: Neurosurgery;  Laterality: N/A;  Cervical three-four ,cervical four five cervical five six Anterior Cervical Decompression Fusion with peek + plate Nuvasive translational plate Orthofix peek (2 1/2 hours) Rm # 32  . Total hip arthroplasty  12/17/2011    Procedure: TOTAL HIP ARTHROPLASTY;  Surgeon: Johnny Bridge, MD;  Location: [facility];  Service: Orthopedics;  Laterality: Left;  . Posterior cervical fusion/foraminotomy N/A 09/23/2013    Procedure: CERVICALTWO TO CERVICAL SEVEN POSTERIOR CERVICAL FUSION/FORAMINOTOMY WITH LATERAL MASS FIXATION;  Surgeon: Eustace Moore, MD;  Location: [facility];  Service: Neurosurgery;  Laterality: N/A;    reports that he has been smoking Cigarettes.  He has a 40 pack-year smoking history. He does not have any smokeless tobacco history on file. He reports that he uses illicit drugs ("Crack" cocaine). He reports that he does not drink alcohol. family history includes Alcohol abuse in his brother, cousin, and father; Arthritis in his mother; Diabetes in his brother and sister; Goiter in his mother and sister; Heart disease in his father and other; Hyperlipidemia in his mother; Hypertension in his sister; Stroke in his father. Allergies  Allergen Reactions  . Hydrocodone Itching    10 -325 makes him itch but 10-500 doesn't  . Morphine And Related Itching  . Tramadol Itching   Current Outpatient Prescriptions on File Prior to Visit  Medication Sig Dispense Refill  . adapalene (DIFFERIN) 0.1 % gel Apply 1 application topically at bedtime.  45 g  0  . aspirin 81 MG tablet Take 81 mg by mouth daily.        . Benzoyl Peroxide 5.5 % CREA Use as directed to affected area  45 g  1  . bimatoprost (LUMIGAN) 0.01 % SOLN Place 1 drop into both eyes at bedtime.      . carboxymethylcellulose (REFRESH PLUS) 0.5 % SOLN Place 1 drop into both eyes 3 (three) times daily as needed (keep eyes clear).       . Diclofenac Sodium (PENNSAID) 2 % SOLN Place 1 application  onto the skin 2 (two) times daily as needed (pain).      [name] diphenhydrAMINE (SOMINEX) 25 MG tablet Take 100 mg by mouth once as needed for itching or sleep. Takes with hydrocodone.      . fluticasone (FLONASE) 50 MCG/ACT nasal spray USE 2 SPRAYS IN EACH NOSTRIL ONCE A DAY  16 g  6  . HYDROcodone-acetaminophen (NORCO) 10-325 MG per tablet Take 1 tablet by mouth every 6 (six) hours as needed for moderate pain.      . methocarbamol (ROBAXIN) 500 MG tablet Take 1 tablet (500 mg total) by mouth every 6 (six) hours as needed for muscle spasms.  60 tablet  3  . minocycline (MINOCIN,DYNACIN) 100 MG capsule Take 1 capsule (100 mg total) by mouth 2 (two) times daily.  60 capsule  0  . Multiple Vitamins-Minerals (MULTIVITAMIN WITH MINERALS) tablet Take 1 tablet by mouth daily.      . ondansetron (ZOFRAN) 4 MG tablet Take 4 mg by mouth every 8 (eight) hours as needed for nausea or vomiting.      . predniSONE (DELTASONE) 10 MG tablet 1 tid with meals  15 tablet  0  . traZODone (DESYREL) 150 MG tablet Take 1 tablet (150 mg total) by mouth at bedtime.  90 tablet  1  . zolpidem (AMBIEN) 10 MG tablet Take 1 tablet (10 mg total) by mouth at bedtime as needed for sleep.  30 tablet  5   No current facility-administered medications on file prior to visit.   Review of Systems  Constitutional: Negative for unusual diaphoresis or other sweats  HENT: Negative for ringing in ear Eyes: Negative for double vision or worsening visual disturbance.  Respiratory: Negative for choking and stridor.   Gastrointestinal: Negative for vomiting or other signifcant bowel change Genitourinary: Negative for hematuria or decreased urine volume.  Musculoskeletal: Negative for other MSK pain or swelling Skin: Negative for color change and worsening wound.  Neurological: Negative for tremors and numbness other than noted  Psychiatric/Behavioral: Negative for decreased concentration or agitation other than above       Objective:    Physical Exam BP 142/100  Pulse 82  Temp(Src) 98.2 F (36.8 C) (Oral)  Wt 171 lb 8 oz (77.792 kg)  SpO2 98% VS noted,  Constitutional: Pt appears well-developed, well-nourished.  HENT: Head: NCAT.  Right Ear: External ear normal.  Left Ear: External ear normal.  Eyes: . Pupils are equal, round, and reactive to light. Conjunctivae and EOM are normal Neck: Normal range of motion. Neck supple.  Cardiovascular: Normal rate and regular rhythm.   Pulmonary/Chest: Effort normal and breath sounds normal.  Abd:  Soft, NT, ND, + BS Neurological: Pt is alert. Not confused , motor grossly intact Skin: Skin is warm. No rash Psychiatric: Pt behavior is normal. No agitation.  Assessment & Plan:

## 2014-07-11 ENCOUNTER — Other Ambulatory Visit: Payer: Self-pay | Admitting: Neurological Surgery

## 2014-07-13 ENCOUNTER — Ambulatory Visit (INDEPENDENT_AMBULATORY_CARE_PROVIDER_SITE_OTHER): Payer: Medicare Other | Admitting: Internal Medicine

## 2014-07-13 ENCOUNTER — Ambulatory Visit (HOSPITAL_COMMUNITY): Payer: Medicare Other | Attending: Cardiovascular Disease | Admitting: *Deleted

## 2014-07-13 ENCOUNTER — Encounter: Payer: Self-pay | Admitting: Internal Medicine

## 2014-07-13 ENCOUNTER — Telehealth: Payer: Self-pay | Admitting: Internal Medicine

## 2014-07-13 ENCOUNTER — Other Ambulatory Visit (INDEPENDENT_AMBULATORY_CARE_PROVIDER_SITE_OTHER): Payer: Medicare Other

## 2014-07-13 ENCOUNTER — Ambulatory Visit: Payer: Medicare Other | Admitting: Internal Medicine

## 2014-07-13 VITALS — BP 122/80 | HR 96 | Temp 98.0°F | Wt 166.2 lb

## 2014-07-13 DIAGNOSIS — I10 Essential (primary) hypertension: Secondary | ICD-10-CM | POA: Insufficient documentation

## 2014-07-13 DIAGNOSIS — M79609 Pain in unspecified limb: Secondary | ICD-10-CM

## 2014-07-13 DIAGNOSIS — M79605 Pain in left leg: Secondary | ICD-10-CM

## 2014-07-13 DIAGNOSIS — R351 Nocturia: Secondary | ICD-10-CM

## 2014-07-13 DIAGNOSIS — F17200 Nicotine dependence, unspecified, uncomplicated: Secondary | ICD-10-CM | POA: Insufficient documentation

## 2014-07-13 DIAGNOSIS — M7989 Other specified soft tissue disorders: Secondary | ICD-10-CM

## 2014-07-13 DIAGNOSIS — N289 Disorder of kidney and ureter, unspecified: Secondary | ICD-10-CM

## 2014-07-13 DIAGNOSIS — R609 Edema, unspecified: Secondary | ICD-10-CM

## 2014-07-13 DIAGNOSIS — R972 Elevated prostate specific antigen [PSA]: Secondary | ICD-10-CM

## 2014-07-13 LAB — CBC WITH DIFFERENTIAL/PLATELET
Basophils Absolute: 0 10*3/uL (ref 0.0–0.1)
Basophils Relative: 0.4 % (ref 0.0–3.0)
EOS PCT: 5 % (ref 0.0–5.0)
Eosinophils Absolute: 0.5 10*3/uL (ref 0.0–0.7)
HCT: 43.9 % (ref 39.0–52.0)
HEMOGLOBIN: 14.2 g/dL (ref 13.0–17.0)
LYMPHS ABS: 1.7 10*3/uL (ref 0.7–4.0)
Lymphocytes Relative: 16.7 % (ref 12.0–46.0)
MCHC: 32.3 g/dL (ref 30.0–36.0)
MCV: 91.7 fl (ref 78.0–100.0)
MONOS PCT: 11.2 % (ref 3.0–12.0)
Monocytes Absolute: 1.1 10*3/uL — ABNORMAL HIGH (ref 0.1–1.0)
NEUTROS ABS: 6.6 10*3/uL (ref 1.4–7.7)
Neutrophils Relative %: 66.7 % (ref 43.0–77.0)
Platelets: 409 10*3/uL — ABNORMAL HIGH (ref 150.0–400.0)
RBC: 4.79 Mil/uL (ref 4.22–5.81)
RDW: 14.7 % (ref 11.5–15.5)
WBC: 9.9 10*3/uL (ref 4.0–10.5)

## 2014-07-13 NOTE — Assessment & Plan Note (Signed)
Primarily left ankle with warmth and effusion, also with large swelling as well to the leg below the knee ? Out of proportion; diff includes traumatic effusion, septic joint, gout or similar, DJD; seems most likely traumatic, for now for left ankle film - r/o DJD

## 2014-07-13 NOTE — Progress Notes (Signed)
Venous duplex of the left lower extremity complete

## 2014-07-13 NOTE — Telephone Encounter (Signed)
See below

## 2014-07-13 NOTE — Telephone Encounter (Signed)
Got moved up

## 2014-07-13 NOTE — Progress Notes (Signed)
Subjective:    Patient ID: Mark Dunlap, male    DOB: 1961/01/09, 53 y.o.   MRN: [id number]  HPI  Here to f/u with acute onset bilat feet and leg swelling, noticed really just yesterday but have been going on longer, left > right, left to the knees, just trace puffiness on the right, also with left ankle pain - ? Due to possible twisting the ankle with a fall 2 days ago, does have some soreness to left plantar, and did fall and bumped left lower back on a dresser in the home in a fall, ? Twisted the ankle, now with ankle and heel pain since then, but no lbp today  Admits to increased salty foods recently.  No prior hx of Cardiac/pulm/renal/liver dz.  No overt bleeding or bruising. Not on diuretic.  Overall good compliance with treatment, and good medicine tolerability.  Only on 5 mg amlodipine, but taking new for one wk.  Pt denies chest pain, increased sob or doe, wheezing, orthopnea, PND, increased LE swelling, palpitations, dizziness or syncope, except for the above.  Pt denies new neurological symptoms such as new headache, or facial or extremity weakness or numbness   Pt denies polydipsia, polyuria.   Had recent labs in ER aug 24 , shared with pt  - had slight low sodium 133, also cr slightly increased to 1.5, gfr 59.   Due for oct 16 cspine fusion  - Dr Shanon Brow Jones/[facility].  Stands at work 10 hrs per shift.   No hx of gout Wt Readings from Last 3 Encounters:  07/13/14 166 lb 4 oz (75.411 kg)  07/06/14 171 lb 8 oz (77.792 kg)  06/10/14 162 lb 4 oz (73.596 kg)    Past Medical History  Diagnosis Date  . THYROID NODULE, RIGHT 03/02/2010  . Adjustment disorder with depressed mood 09/2007  . 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 HYPOTENSION 10/31/2009  . Acute prostatitis 05/23/2009  . SPINAL STENOSIS, CERVICAL 08/08/2009  . H/O: substance abuse     hx of ETOH/Crack cocaine-none since 11/08 per pt/Does not Drive due to this  . DJD (degenerative  joint disease) of knee     left knee  . DJD (degenerative joint disease) of hip   . HYPERTENSION 08/30/2008  . CERVICAL RADICULOPATHY, LEFT 08/30/2008  . Other and unspecified hyperlipidemia 08/20/2013  . HYPERTHYROIDISM 02/13/2010    pt was told by  Dr Jenny Reichmann  that thyroid was now back to normal .. 2012 ...   . Seasonal allergies   . Legally blind in left eye, as defined in [place]     Blind Left eye, small amt vision Right eye   Past Surgical History  Procedure Laterality Date  . Right hand i&d  12/07    s/p rdue to abscess-Dr. Amedeo Plenty  . Anterior cervical decomp/discectomy fusion  10/11/2011    Procedure: ANTERIOR CERVICAL DECOMPRESSION/DISCECTOMY FUSION 3 LEVELS;  Surgeon: Eustace Moore;  Location: [facility];  Service: Neurosurgery;  Laterality: N/A;  Cervical three-four ,cervical four five cervical five six Anterior Cervical Decompression Fusion with peek + plate Nuvasive translational plate Orthofix peek (2 1/2 hours) Rm # 32  . Total hip arthroplasty  12/17/2011    Procedure: TOTAL HIP ARTHROPLASTY;  Surgeon: Johnny Bridge, MD;  Location: [facility];  Service: Orthopedics;  Laterality: Left;  . Posterior cervical fusion/foraminotomy N/A 09/23/2013    Procedure: CERVICALTWO TO CERVICAL SEVEN POSTERIOR CERVICAL FUSION/FORAMINOTOMY  WITH LATERAL MASS FIXATION;  Surgeon: Eustace Moore, MD;  Location: [facility];  Service: Neurosurgery;  Laterality: N/A;    reports that he has been smoking Cigarettes.  He has a 40 pack-year smoking history. He does not have any smokeless tobacco history on file. He reports that he uses illicit drugs ("Crack" cocaine). He reports that he does not drink alcohol. family history includes Alcohol abuse in his brother, cousin, and father; Arthritis in his mother; Diabetes in his brother and sister; Goiter in his mother and sister; Heart disease in his father and other; Hyperlipidemia in his mother; Hypertension in his sister; Stroke in his father. Allergies  Allergen  Reactions  . Hydrocodone Itching    10 -325 makes him itch but 10-500 doesn't  . Morphine And Related Itching  . Tramadol Itching   Current Outpatient Prescriptions on File Prior to Visit  Medication Sig Dispense Refill  . adapalene (DIFFERIN) 0.1 % gel Apply 1 application topically at bedtime.  45 g  0  . amLODipine (NORVASC) 5 MG tablet Take 1 tablet (5 mg total) by mouth daily.  90 tablet  3  . aspirin 81 MG tablet Take 81 mg by mouth daily.        . Benzoyl Peroxide 5.5 % CREA Use as directed to affected area  45 g  1  . bimatoprost (LUMIGAN) 0.01 % SOLN Place 1 drop into both eyes at bedtime.      . carboxymethylcellulose (REFRESH PLUS) 0.5 % SOLN Place 1 drop into both eyes 3 (three) times daily as needed (keep eyes clear).       . Diclofenac Sodium (PENNSAID) 2 % SOLN Place 1 application onto the skin 2 (two) times daily as needed (pain).      [name] diphenhydrAMINE (SOMINEX) 25 MG tablet Take 100 mg by mouth once as needed for itching or sleep. Takes with hydrocodone.      . fluticasone (FLONASE) 50 MCG/ACT nasal spray USE 2 SPRAYS IN EACH NOSTRIL ONCE A DAY  16 g  6  . HYDROcodone-acetaminophen (NORCO) 10-325 MG per tablet Take 1 tablet by mouth every 6 (six) hours as needed for moderate pain.      . methocarbamol (ROBAXIN) 500 MG tablet Take 1 tablet (500 mg total) by mouth every 6 (six) hours as needed for muscle spasms.  60 tablet  3  . minocycline (MINOCIN,DYNACIN) 100 MG capsule Take 1 capsule (100 mg total) by mouth 2 (two) times daily.  60 capsule  0  . Multiple Vitamins-Minerals (MULTIVITAMIN WITH MINERALS) tablet Take 1 tablet by mouth daily.      . ondansetron (ZOFRAN) 4 MG tablet Take 4 mg by mouth every 8 (eight) hours as needed for nausea or vomiting.      . predniSONE (DELTASONE) 10 MG tablet 1 tid with meals  15 tablet  0  . traZODone (DESYREL) 150 MG tablet Take 1 tablet (150 mg total) by mouth at bedtime.  90 tablet  1  . zolpidem (AMBIEN) 10 MG tablet Take 1 tablet (10  mg total) by mouth at bedtime as needed for sleep.  30 tablet  5   No current facility-administered medications on file prior to visit.     Review of Systems  Constitutional: Negative for unusual diaphoresis or other sweats  HENT: Negative for ringing in ear Eyes: Negative for double vision or worsening visual disturbance.  Respiratory: Negative for choking and stridor.   Gastrointestinal: Negative for vomiting or other signifcant bowel  change Genitourinary: Negative for hematuria or decreased urine volume.  Musculoskeletal: Negative for other MSK pain or swelling Skin: Negative for color change and worsening wound.  Neurological: Negative for tremors and numbness other than noted  Psychiatric/Behavioral: Negative for decreased concentration or agitation other than above       Objective:   Physical Exam BP 122/80  Pulse 96  Temp(Src) 98 F (36.7 C) (Oral)  Wt 166 lb 4 oz (75.411 kg)  SpO2 99% VS noted,  Constitutional: Pt appears well-developed, well-nourished.  HENT: Head: NCAT.  Right Ear: External ear normal.  Left Ear: External ear normal.  Eyes: . Pupils are equal, round, and reactive to light. Conjunctivae and EOM are normal Neck: Normal range of motion. Neck supple.  Cardiovascular: Normal rate and regular rhythm.   Pulmonary/Chest: Effort normal and breath sounds normal.  Neurological: Pt is alert. Not confused , motor grossly intact Skin: Skin is warm. No rash Left knee no effusion, FROM, NT LLE with 2+ edema to knee, also left ankle with warm/tender/swelling - seems to be the nexus to me for the swelling, no skin red/tender/drainage/red streaks/fever RLE with minimal to trace puffiness below the knee Psychiatric: Pt behavior is normal. No agitation.     Assessment & Plan:

## 2014-07-13 NOTE — Assessment & Plan Note (Signed)
With rather large swelling LLE, trivial on the right, doubt related to the new amlodipine 5 mg, for LLE venous doppler, o/w follow

## 2014-07-13 NOTE — Telephone Encounter (Signed)
Patient has an appt this evening at 6:15.  He is requesting to be worked in sooner b.c he is not feeling well.

## 2014-07-13 NOTE — Telephone Encounter (Signed)
[place] for 3PM if he wants to come then

## 2014-07-13 NOTE — Patient Instructions (Signed)
Please continue all other medications as before, and refills have been done if requested.  Please have the pharmacy call with any other refills you may need.  Please keep your appointments with your specialists as you may have planned  You can also take tylenol as needed for pain.  You will be contacted regarding the referral for: Left leg ultrasound  - to rule out blood clot in the leg  Please go to the XRAY Department in the Basement (go straight as you get off the elevator) for the x-ray testing - the left ankle xray  Please go to the LAB in the Basement (turn left off the elevator) for the tests to be done today  You will be contacted by phone if any changes need to be made immediately.  Otherwise, you will receive a letter about your results with an explanation, but please check with MyChart first.  Please remember to sign up for MyChart if you have not done so, as this will be important to you in the future with finding out test results, communicating by private email, and scheduling acute appointments online when needed.  You are given the work note today  If the tests are negative (xray and ultrasound and labs) , you should be able to have the surgury as planned

## 2014-07-13 NOTE — Progress Notes (Signed)
Pre visit review using our clinic review tool, if applicable. No additional management support is needed unless otherwise documented below in the visit note. 

## 2014-07-13 NOTE — Assessment & Plan Note (Signed)
fo f/u lab after cr 1.4 late august, also for cbc with left leg symtpoms

## 2014-07-14 ENCOUNTER — Encounter: Payer: Self-pay | Admitting: Internal Medicine

## 2014-07-14 ENCOUNTER — Ambulatory Visit (INDEPENDENT_AMBULATORY_CARE_PROVIDER_SITE_OTHER)
Admission: RE | Admit: 2014-07-14 | Discharge: 2014-07-14 | Disposition: A | Discharge status: Home / Self Care | Payer: Medicare Other | Source: Ambulatory Visit | Attending: Internal Medicine | Admitting: Internal Medicine

## 2014-07-14 DIAGNOSIS — M79605 Pain in left leg: Secondary | ICD-10-CM

## 2014-07-14 DIAGNOSIS — M7989 Other specified soft tissue disorders: Secondary | ICD-10-CM

## 2014-07-14 LAB — BASIC METABOLIC PANEL
BUN: 9 mg/dL (ref 6–23)
CALCIUM: 9.8 mg/dL (ref 8.4–10.5)
CO2: 32 mEq/L (ref 19–32)
CREATININE: 1.1 mg/dL (ref 0.4–1.5)
Chloride: 101 mEq/L (ref 96–112)
GFR: 88.64 mL/min (ref >60.00)
Glucose, Bld: 83 mg/dL (ref 70–99)
Potassium: 5 mEq/L (ref 3.5–5.1)
Sodium: 137 mEq/L (ref 135–145)

## 2014-07-14 LAB — PSA: PSA: 2.52 ng/mL (ref 0.10–4.00)

## 2014-07-21 ENCOUNTER — Encounter (HOSPITAL_COMMUNITY): Payer: Self-pay | Admitting: Pharmacy Technician

## 2014-07-21 NOTE — Pre-Procedure Instructions (Signed)
Mark Dunlap  07/21/2014   Your procedure is scheduled on:  Friday, October 16th  Report to [facility] Admitting at 530 AM.  Call this number if you have problems the morning of surgery: [phone]   Remember:   Do not eat food or drink liquids after midnight.   Take these medicines the morning of surgery with A SIP OF WATER: norvasc, prednisone, eye drops, flonase, pain medication if needed   Do not wear jewelry.  Do not wear lotions, powders, or perfumes. You may wear deodorant.  Do not shave 48 hours prior to surgery. Men may shave face and neck.  Do not bring valuables to the hospital.  [facility] is not responsible for any belongings or valuables.               Contacts, dentures or bridgework may not be worn into surgery.  Leave suitcase in the car. After surgery it may be brought to your room.  For patients admitted to the hospital, discharge time is determined by your treatment team.               Patients discharged the day of surgery will not be allowed to drive home.  Please read over the following fact sheets that you were given: Pain Booklet, Coughing and Deep Breathing, MRSA Information and Surgical Site Infection Prevention [place] - Preparing for Surgery  Before surgery, you can play an important role.  Because skin is not sterile, your skin needs to be as free of germs as possible.  You can reduce the number of germs on you skin by washing with CHG (chlorahexidine gluconate) soap before surgery.  CHG is an antiseptic cleaner which kills germs and bonds with the skin to continue killing germs even after washing.  Please DO NOT use if you have an allergy to CHG or antibacterial soaps.  If your skin becomes reddened/irritated stop using the CHG and inform your nurse when you arrive at Short Stay.  Do not shave (including legs and underarms) for at least 48 hours prior to the first CHG shower.  You may shave your face.  Please follow these  instructions carefully:   1.  Shower with CHG Soap the night before surgery and the morning of Surgery.  2.  If you choose to wash your hair, wash your hair first as usual with your normal shampoo.  3.  After you shampoo, rinse your hair and body thoroughly to remove the shampoo.  4.  Use CHG as you would any other liquid soap.  You can apply CHG directly to the skin and wash gently with scrungie or a clean washcloth.  5.  Apply the CHG Soap to your body ONLY FROM THE NECK DOWN.  Do not use on open wounds or open sores.  Avoid contact with your eyes, ears, mouth and genitals (private parts).  Wash genitals (private parts) with your normal soap.  6.  Wash thoroughly, paying special attention to the area where your surgery will be performed.  7.  Thoroughly rinse your body with warm water from the neck down.  8.  DO NOT shower/wash with your normal soap after using and rinsing off the CHG Soap.  9.  Pat yourself dry with a clean towel.            10.  Wear clean pajamas.            11.  Place clean sheets on your bed the night  of your first shower and do not sleep with pets.  Day of Surgery  Do not apply any lotions/deoderants the morning of surgery.  Please wear clean clothes to the hospital/surgery center.

## 2014-07-22 ENCOUNTER — Encounter (HOSPITAL_COMMUNITY)
Admission: RE | Admit: 2014-07-22 | Discharge: 2014-07-22 | Disposition: A | Discharge status: Home / Self Care | Payer: Medicare Other | Source: Ambulatory Visit | Attending: Neurological Surgery | Admitting: Neurological Surgery

## 2014-07-22 ENCOUNTER — Encounter (HOSPITAL_COMMUNITY): Payer: Self-pay

## 2014-07-22 DIAGNOSIS — E785 Hyperlipidemia, unspecified: Secondary | ICD-10-CM | POA: Insufficient documentation

## 2014-07-22 DIAGNOSIS — H4089 Other specified glaucoma: Secondary | ICD-10-CM | POA: Insufficient documentation

## 2014-07-22 DIAGNOSIS — H5442 Blindness, left eye, normal vision right eye: Secondary | ICD-10-CM | POA: Insufficient documentation

## 2014-07-22 DIAGNOSIS — Z0000 Encounter for general adult medical examination without abnormal findings: Secondary | ICD-10-CM | POA: Diagnosis not present

## 2014-07-22 DIAGNOSIS — M4802 Spinal stenosis, cervical region: Secondary | ICD-10-CM | POA: Insufficient documentation

## 2014-07-22 DIAGNOSIS — F4321 Adjustment disorder with depressed mood: Secondary | ICD-10-CM | POA: Diagnosis not present

## 2014-07-22 DIAGNOSIS — I10 Essential (primary) hypertension: Secondary | ICD-10-CM | POA: Diagnosis not present

## 2014-07-22 DIAGNOSIS — M1712 Unilateral primary osteoarthritis, left knee: Secondary | ICD-10-CM | POA: Insufficient documentation

## 2014-07-22 DIAGNOSIS — M1610 Unilateral primary osteoarthritis, unspecified hip: Secondary | ICD-10-CM | POA: Insufficient documentation

## 2014-07-22 LAB — COMPREHENSIVE METABOLIC PANEL
ALT: 20 U/L (ref 0–53)
ANION GAP: 12 (ref 5–15)
AST: 28 U/L (ref 0–37)
Albumin: 3.5 g/dL (ref 3.5–5.2)
Alkaline Phosphatase: 106 U/L (ref 39–117)
BUN: 9 mg/dL (ref 6–23)
CALCIUM: 9.2 mg/dL (ref 8.4–10.5)
CO2: 26 mEq/L (ref 19–32)
CREATININE: 0.95 mg/dL (ref 0.50–1.35)
Chloride: 102 mEq/L (ref 96–112)
GFR calc Af Amer: >90 mL/min (ref >90)
GLUCOSE: 91 mg/dL (ref 70–99)
Potassium: 4.2 mEq/L (ref 3.7–5.3)
Sodium: 140 mEq/L (ref 137–147)
Total Bilirubin: 0.3 mg/dL (ref 0.3–1.2)
Total Protein: 7.3 g/dL (ref 6.0–8.3)

## 2014-07-22 LAB — CBC WITH DIFFERENTIAL/PLATELET
BASOS ABS: 0.1 10*3/uL (ref 0.0–0.1)
Basophils Relative: 1 % (ref 0–1)
EOS ABS: 0.6 10*3/uL (ref 0.0–0.7)
EOS PCT: 8 % — AB (ref 0–5)
HCT: 41.2 % (ref 39.0–52.0)
Hemoglobin: 13.6 g/dL (ref 13.0–17.0)
Lymphocytes Relative: 22 % (ref 12–46)
Lymphs Abs: 1.7 10*3/uL (ref 0.7–4.0)
MCH: 29.7 pg (ref 26.0–34.0)
MCHC: 33 g/dL (ref 30.0–36.0)
MCV: 90 fL (ref 78.0–100.0)
Monocytes Absolute: 0.7 10*3/uL (ref 0.1–1.0)
Monocytes Relative: 9 % (ref 3–12)
Neutro Abs: 4.5 10*3/uL (ref 1.7–7.7)
Neutrophils Relative %: 60 % (ref 43–77)
PLATELETS: 340 10*3/uL (ref 150–400)
RBC: 4.58 MIL/uL (ref 4.22–5.81)
RDW: 14.2 % (ref 11.5–15.5)
WBC: 7.6 10*3/uL (ref 4.0–10.5)

## 2014-07-22 LAB — PROTIME-INR
INR: 0.96 (ref 0.00–1.49)
PROTHROMBIN TIME: 12.8 s (ref 11.6–15.2)

## 2014-07-22 LAB — SURGICAL PCR SCREEN
MRSA, PCR: NEGATIVE
Staphylococcus aureus: NEGATIVE

## 2014-07-28 MED ORDER — DEXAMETHASONE SODIUM PHOSPHATE 10 MG/ML IJ SOLN
10.0000 mg | INTRAMUSCULAR | Status: AC
  Administered 2014-07-29: 10 mg via INTRAVENOUS
  Filled 2014-07-28: qty 1

## 2014-07-28 MED ORDER — CEFAZOLIN SODIUM-DEXTROSE 2-3 GM-% IV SOLR
2.0000 g | INTRAVENOUS | Status: AC
  Administered 2014-07-29: 2 g via INTRAVENOUS
  Filled 2014-07-28: qty 50

## 2014-07-29 ENCOUNTER — Encounter (HOSPITAL_COMMUNITY)
Admission: RE | Disposition: A | Discharge status: Home / Self Care | Payer: Self-pay | Source: Ambulatory Visit | Attending: Neurological Surgery

## 2014-07-29 ENCOUNTER — Encounter (HOSPITAL_COMMUNITY): Payer: Self-pay | Admitting: *Deleted

## 2014-07-29 ENCOUNTER — Inpatient Hospital Stay (HOSPITAL_COMMUNITY)
Admission: RE | Admit: 2014-07-29 | Discharge: 2014-07-30 | DRG: 473 | Disposition: A | Discharge status: Home / Self Care | Payer: Medicare Other | Source: Ambulatory Visit | Attending: Neurological Surgery | Admitting: Neurological Surgery

## 2014-07-29 ENCOUNTER — Inpatient Hospital Stay (HOSPITAL_COMMUNITY): Payer: Medicare Other

## 2014-07-29 ENCOUNTER — Inpatient Hospital Stay (HOSPITAL_COMMUNITY): Payer: Medicare Other | Admitting: Anesthesiology

## 2014-07-29 ENCOUNTER — Encounter (HOSPITAL_COMMUNITY): Payer: Medicare Other | Admitting: Anesthesiology

## 2014-07-29 DIAGNOSIS — Z981 Arthrodesis status: Secondary | ICD-10-CM

## 2014-07-29 DIAGNOSIS — H5412 Blindness, left eye, low vision right eye: Secondary | ICD-10-CM | POA: Diagnosis present

## 2014-07-29 DIAGNOSIS — M4722 Other spondylosis with radiculopathy, cervical region: Secondary | ICD-10-CM | POA: Diagnosis present

## 2014-07-29 DIAGNOSIS — I10 Essential (primary) hypertension: Secondary | ICD-10-CM | POA: Diagnosis present

## 2014-07-29 DIAGNOSIS — M542 Cervicalgia: Secondary | ICD-10-CM | POA: Diagnosis present

## 2014-07-29 DIAGNOSIS — E785 Hyperlipidemia, unspecified: Secondary | ICD-10-CM | POA: Diagnosis present

## 2014-07-29 DIAGNOSIS — H409 Unspecified glaucoma: Secondary | ICD-10-CM | POA: Diagnosis present

## 2014-07-29 DIAGNOSIS — Z7982 Long term (current) use of aspirin: Secondary | ICD-10-CM | POA: Diagnosis not present

## 2014-07-29 DIAGNOSIS — F17210 Nicotine dependence, cigarettes, uncomplicated: Secondary | ICD-10-CM | POA: Diagnosis present

## 2014-07-29 DIAGNOSIS — Z96642 Presence of left artificial hip joint: Secondary | ICD-10-CM | POA: Diagnosis present

## 2014-07-29 DIAGNOSIS — M4322 Fusion of spine, cervical region: Secondary | ICD-10-CM

## 2014-07-29 HISTORY — PX: ANTERIOR CERVICAL DECOMP/DISCECTOMY FUSION: SHX1161

## 2014-07-29 SURGERY — ANTERIOR CERVICAL DECOMPRESSION/DISCECTOMY FUSION 1 LEVEL/HARDWARE REMOVAL
Anesthesia: General

## 2014-07-29 MED ORDER — POTASSIUM CHLORIDE IN NACL 20-0.9 MEQ/L-% IV SOLN
INTRAVENOUS | Status: DC
  Filled 2014-07-29 (×3): qty 1000

## 2014-07-29 MED ORDER — GELATIN ABSORBABLE MT POWD
OROMUCOSAL | Status: DC | PRN
  Administered 2014-07-29: 08:00:00 via TOPICAL

## 2014-07-29 MED ORDER — OXYCODONE HCL 5 MG/5ML PO SOLN: 5.0000 mg | Freq: Once | ORAL | Status: DC | PRN

## 2014-07-29 MED ORDER — MIDAZOLAM HCL 2 MG/2ML IJ SOLN
INTRAMUSCULAR | Status: AC
  Filled 2014-07-29: qty 2

## 2014-07-29 MED ORDER — HYDROMORPHONE HCL 1 MG/ML IJ SOLN
0.2500 mg | INTRAMUSCULAR | Status: DC | PRN
  Administered 2014-07-29 (×2): 0.5 mg via INTRAVENOUS

## 2014-07-29 MED ORDER — ALUM & MAG HYDROXIDE-SIMETH 200-200-20 MG/5ML PO SUSP
30.0000 mL | ORAL | Status: DC | PRN
  Administered 2014-07-30 (×2): 30 mL via ORAL
  Filled 2014-07-29 (×2): qty 30

## 2014-07-29 MED ORDER — PROMETHAZINE HCL 25 MG/ML IJ SOLN
6.2500 mg | INTRAMUSCULAR | Status: DC | PRN
  Administered 2014-07-29: 6.25 mg via INTRAVENOUS

## 2014-07-29 MED ORDER — HYDROMORPHONE HCL 1 MG/ML IJ SOLN
INTRAMUSCULAR | Status: AC
  Filled 2014-07-29: qty 1

## 2014-07-29 MED ORDER — AMLODIPINE BESYLATE 5 MG PO TABS
5.0000 mg | ORAL_TABLET | Freq: Every day | ORAL | Status: DC
  Administered 2014-07-30: 5 mg via ORAL
  Filled 2014-07-29: qty 1

## 2014-07-29 MED ORDER — FENTANYL CITRATE 0.05 MG/ML IJ SOLN
INTRAMUSCULAR | Status: AC
  Filled 2014-07-29: qty 5

## 2014-07-29 MED ORDER — SODIUM CHLORIDE 0.9 % IJ SOLN: 3.0000 mL | Freq: Two times a day (BID) | INTRAMUSCULAR | Status: DC

## 2014-07-29 MED ORDER — LACTATED RINGERS IV SOLN
INTRAVENOUS | Status: DC | PRN
  Administered 2014-07-29 (×2): via INTRAVENOUS

## 2014-07-29 MED ORDER — NEOSTIGMINE METHYLSULFATE 10 MG/10ML IV SOLN
INTRAVENOUS | Status: AC
  Filled 2014-07-29: qty 1

## 2014-07-29 MED ORDER — PHENYLEPHRINE HCL 10 MG/ML IJ SOLN
10.0000 mg | INTRAVENOUS | Status: DC | PRN
  Administered 2014-07-29: 40 ug/min via INTRAVENOUS

## 2014-07-29 MED ORDER — PROPOFOL 10 MG/ML IV BOLUS
INTRAVENOUS | Status: AC
  Filled 2014-07-29: qty 20

## 2014-07-29 MED ORDER — ONDANSETRON HCL 4 MG/2ML IJ SOLN: 4.0000 mg | INTRAMUSCULAR | Status: DC | PRN

## 2014-07-29 MED ORDER — ROCURONIUM BROMIDE 50 MG/5ML IV SOLN
INTRAVENOUS | Status: AC
Start: 2014-07-29 — End: 2014-07-29
  Filled 2014-07-29: qty 1

## 2014-07-29 MED ORDER — SODIUM CHLORIDE 0.9 % IR SOLN
Status: DC | PRN
  Administered 2014-07-29: 08:00:00

## 2014-07-29 MED ORDER — GLYCOPYRROLATE 0.2 MG/ML IJ SOLN
INTRAMUSCULAR | Status: AC
  Filled 2014-07-29: qty 2

## 2014-07-29 MED ORDER — ONDANSETRON HCL 4 MG/2ML IJ SOLN
INTRAMUSCULAR | Status: DC | PRN
  Administered 2014-07-29: 4 mg via INTRAVENOUS

## 2014-07-29 MED ORDER — PHENOL 1.4 % MT LIQD
1.0000 | OROMUCOSAL | Status: DC | PRN
  Administered 2014-07-29: 1 via OROMUCOSAL
  Filled 2014-07-29: qty 177

## 2014-07-29 MED ORDER — PROPOFOL 10 MG/ML IV BOLUS
INTRAVENOUS | Status: DC | PRN
  Administered 2014-07-29: 120 mg via INTRAVENOUS

## 2014-07-29 MED ORDER — PROMETHAZINE HCL 25 MG/ML IJ SOLN
INTRAMUSCULAR | Status: AC
  Filled 2014-07-29: qty 1

## 2014-07-29 MED ORDER — EPHEDRINE SULFATE 50 MG/ML IJ SOLN
INTRAMUSCULAR | Status: DC | PRN
  Administered 2014-07-29 (×2): 10 mg via INTRAVENOUS

## 2014-07-29 MED ORDER — 0.9 % SODIUM CHLORIDE (POUR BTL) OPTIME
TOPICAL | Status: DC | PRN
  Administered 2014-07-29: 1000 mL

## 2014-07-29 MED ORDER — MIDAZOLAM HCL 2 MG/2ML IJ SOLN
INTRAMUSCULAR | Status: DC | PRN
  Administered 2014-07-29: 2 mg via INTRAVENOUS

## 2014-07-29 MED ORDER — LIDOCAINE HCL (CARDIAC) 20 MG/ML IV SOLN
INTRAVENOUS | Status: DC | PRN
  Administered 2014-07-29: 30 mg via INTRAVENOUS

## 2014-07-29 MED ORDER — LIDOCAINE HCL (CARDIAC) 20 MG/ML IV SOLN
INTRAVENOUS | Status: AC
  Filled 2014-07-29: qty 5

## 2014-07-29 MED ORDER — METHOCARBAMOL 500 MG PO TABS
500.0000 mg | ORAL_TABLET | Freq: Four times a day (QID) | ORAL | Status: DC | PRN
  Administered 2014-07-29 (×2): 500 mg via ORAL
  Filled 2014-07-29 (×2): qty 1

## 2014-07-29 MED ORDER — THROMBIN 5000 UNITS EX SOLR
CUTANEOUS | Status: DC | PRN
  Administered 2014-07-29 (×2): 5000 [IU] via TOPICAL

## 2014-07-29 MED ORDER — BUPIVACAINE HCL (PF) 0.25 % IJ SOLN
INTRAMUSCULAR | Status: DC | PRN
  Administered 2014-07-29: 4 mL

## 2014-07-29 MED ORDER — GLYCOPYRROLATE 0.2 MG/ML IJ SOLN
INTRAMUSCULAR | Status: DC | PRN
  Administered 2014-07-29: 0.4 mg via INTRAVENOUS

## 2014-07-29 MED ORDER — HEMOSTATIC AGENTS (NO CHARGE) OPTIME
TOPICAL | Status: DC | PRN
  Administered 2014-07-29: 1 via TOPICAL

## 2014-07-29 MED ORDER — MENTHOL 3 MG MT LOZG
1.0000 | LOZENGE | OROMUCOSAL | Status: DC | PRN
  Filled 2014-07-29: qty 9

## 2014-07-29 MED ORDER — OXYCODONE HCL 5 MG PO TABS: 5.0000 mg | ORAL_TABLET | Freq: Once | ORAL | Status: DC | PRN

## 2014-07-29 MED ORDER — NEOSTIGMINE METHYLSULFATE 10 MG/10ML IV SOLN
INTRAVENOUS | Status: DC | PRN
  Administered 2014-07-29: 2 mg via INTRAVENOUS

## 2014-07-29 MED ORDER — MEPERIDINE HCL 25 MG/ML IJ SOLN: 6.2500 mg | INTRAMUSCULAR | Status: DC | PRN

## 2014-07-29 MED ORDER — ROCURONIUM BROMIDE 100 MG/10ML IV SOLN
INTRAVENOUS | Status: DC | PRN
  Administered 2014-07-29: 10 mg via INTRAVENOUS
  Administered 2014-07-29: 40 mg via INTRAVENOUS
  Administered 2014-07-29: 10 mg via INTRAVENOUS

## 2014-07-29 MED ORDER — PNEUMOCOCCAL VAC POLYVALENT 25 MCG/0.5ML IJ INJ
0.5000 mL | INJECTION | INTRAMUSCULAR | Status: AC
  Administered 2014-07-30: 0.5 mL via INTRAMUSCULAR
  Filled 2014-07-29: qty 0.5

## 2014-07-29 MED ORDER — FENTANYL CITRATE 0.05 MG/ML IJ SOLN
INTRAMUSCULAR | Status: DC | PRN
  Administered 2014-07-29 (×2): 50 ug via INTRAVENOUS
  Administered 2014-07-29: 150 ug via INTRAVENOUS
  Administered 2014-07-29 (×2): 50 ug via INTRAVENOUS

## 2014-07-29 MED ORDER — SODIUM CHLORIDE 0.9 % IV SOLN: 250.0000 mL | INTRAVENOUS | Status: DC

## 2014-07-29 MED ORDER — HYDROCODONE-ACETAMINOPHEN 10-325 MG PO TABS
1.0000 | ORAL_TABLET | Freq: Four times a day (QID) | ORAL | Status: DC | PRN
  Administered 2014-07-29 – 2014-07-30 (×4): 1 via ORAL
  Filled 2014-07-29 (×4): qty 1

## 2014-07-29 MED ORDER — ACETAMINOPHEN 650 MG RE SUPP: 650.0000 mg | RECTAL | Status: DC | PRN

## 2014-07-29 MED ORDER — MIDAZOLAM HCL 2 MG/2ML IJ SOLN: 0.5000 mg | Freq: Once | INTRAMUSCULAR | Status: DC | PRN

## 2014-07-29 MED ORDER — SODIUM CHLORIDE 0.9 % IJ SOLN: 3.0000 mL | INTRAMUSCULAR | Status: DC | PRN

## 2014-07-29 MED ORDER — ONDANSETRON HCL 4 MG/2ML IJ SOLN
INTRAMUSCULAR | Status: AC
  Filled 2014-07-29: qty 2

## 2014-07-29 MED ORDER — DIPHENHYDRAMINE HCL 25 MG PO CAPS
25.0000 mg | ORAL_CAPSULE | ORAL | Status: DC | PRN
  Administered 2014-07-29 – 2014-07-30 (×3): 25 mg via ORAL
  Filled 2014-07-29 (×3): qty 1

## 2014-07-29 MED ORDER — CEFAZOLIN SODIUM 1-5 GM-% IV SOLN
1.0000 g | Freq: Three times a day (TID) | INTRAVENOUS | Status: AC
  Administered 2014-07-29: 1 g via INTRAVENOUS
  Filled 2014-07-29 (×2): qty 50

## 2014-07-29 MED ORDER — LATANOPROST 0.005 % OP SOLN
1.0000 [drp] | Freq: Every day | OPHTHALMIC | Status: DC
  Administered 2014-07-29: 1 [drp] via OPHTHALMIC
  Filled 2014-07-29: qty 2.5

## 2014-07-29 MED ORDER — MINOCYCLINE HCL 100 MG PO CAPS
100.0000 mg | ORAL_CAPSULE | Freq: Two times a day (BID) | ORAL | Status: DC
  Administered 2014-07-29 – 2014-07-30 (×3): 100 mg via ORAL
  Filled 2014-07-29 (×4): qty 1

## 2014-07-29 MED ORDER — ACETAMINOPHEN 325 MG PO TABS: 650.0000 mg | ORAL_TABLET | ORAL | Status: DC | PRN

## 2014-07-29 MED ORDER — ZOLPIDEM TARTRATE 5 MG PO TABS
5.0000 mg | ORAL_TABLET | Freq: Every evening | ORAL | Status: DC | PRN
  Administered 2014-07-29: 5 mg via ORAL
  Filled 2014-07-29: qty 1

## 2014-07-29 MED ORDER — EPHEDRINE SULFATE 50 MG/ML IJ SOLN
INTRAMUSCULAR | Status: AC
  Filled 2014-07-29: qty 1

## 2014-07-29 SURGICAL SUPPLY — 52 items
BAG DECANTER FOR FLEXI CONT (MISCELLANEOUS) ×3 IMPLANT
BENZOIN TINCTURE PRP APPL 2/3 (GAUZE/BANDAGES/DRESSINGS) ×3 IMPLANT
BUR MATCHSTICK NEURO 3.0 LAGG (BURR) ×3 IMPLANT
CANISTER SUCT 3000ML (MISCELLANEOUS) ×3 IMPLANT
CLOSURE WOUND 1/2 X4 (GAUZE/BANDAGES/DRESSINGS) ×1
CONT SPEC 4OZ CLIKSEAL STRL BL (MISCELLANEOUS) ×3 IMPLANT
DERMABOND ADVANCED (GAUZE/BANDAGES/DRESSINGS) ×2
DERMABOND ADVANCED .7 DNX12 (GAUZE/BANDAGES/DRESSINGS) ×1 IMPLANT
DRAIN SNY WOU 7FLT (WOUND CARE) ×3 IMPLANT
DRAPE C-ARM 42X72 X-RAY (DRAPES) ×6 IMPLANT
DRAPE LAPAROTOMY 100X72 PEDS (DRAPES) ×3 IMPLANT
DRAPE MICROSCOPE LEICA (MISCELLANEOUS) ×3 IMPLANT
DRAPE POUCH INSTRU U-SHP 10X18 (DRAPES) ×3 IMPLANT
DRILL BIT HELIX (BIT) ×3 IMPLANT
DRSG OPSITE 4X5.5 SM (GAUZE/BANDAGES/DRESSINGS) IMPLANT
DRSG OPSITE POSTOP 3X4 (GAUZE/BANDAGES/DRESSINGS) ×3 IMPLANT
DRSG TELFA 3X8 NADH (GAUZE/BANDAGES/DRESSINGS) IMPLANT
DURAPREP 6ML APPLICATOR 50/CS (WOUND CARE) ×3 IMPLANT
ELECT COATED BLADE 2.86 ST (ELECTRODE) ×3 IMPLANT
ELECT REM PT RETURN 9FT ADLT (ELECTROSURGICAL) ×3
ELECTRODE REM PT RTRN 9FT ADLT (ELECTROSURGICAL) ×1 IMPLANT
EVACUATOR SILICONE 100CC (DRAIN) ×3 IMPLANT
GAUZE SPONGE 4X4 16PLY XRAY LF (GAUZE/BANDAGES/DRESSINGS) IMPLANT
GLOVE BIO SURGEON STRL SZ8 (GLOVE) ×3 IMPLANT
GOWN STRL REUS W/ TWL LRG LVL3 (GOWN DISPOSABLE) IMPLANT
GOWN STRL REUS W/ TWL XL LVL3 (GOWN DISPOSABLE) IMPLANT
GOWN STRL REUS W/TWL 2XL LVL3 (GOWN DISPOSABLE) ×3 IMPLANT
GOWN STRL REUS W/TWL LRG LVL3 (GOWN DISPOSABLE)
GOWN STRL REUS W/TWL XL LVL3 (GOWN DISPOSABLE)
HALTER HD/CHIN CERV TRACTION D (MISCELLANEOUS) IMPLANT
HEMOSTAT POWDER KIT SURGIFOAM (HEMOSTASIS) ×3 IMPLANT
KIT BASIN OR (CUSTOM PROCEDURE TRAY) ×3 IMPLANT
KIT ROOM TURNOVER OR (KITS) ×3 IMPLANT
NEEDLE HYPO 25X1 1.5 SAFETY (NEEDLE) ×3 IMPLANT
NEEDLE SPNL 20GX3.5 QUINCKE YW (NEEDLE) ×3 IMPLANT
NS IRRIG 1000ML POUR BTL (IV SOLUTION) ×3 IMPLANT
PACK LAMINECTOMY NEURO (CUSTOM PROCEDURE TRAY) ×3 IMPLANT
PAD ARMBOARD 7.5X6 YLW CONV (MISCELLANEOUS) ×9 IMPLANT
PATTIES SURGICAL 1X1 (DISPOSABLE) ×3 IMPLANT
PLATE ARCHON 24MM 1LVL (Plate) ×3 IMPLANT
RUBBERBAND STERILE (MISCELLANEOUS) ×6 IMPLANT
SCREW ARCHON SELFDRILL 4.0X13 (Screw) ×6 IMPLANT
SCREW ARCHON SELFTAP 4.0X13 (Screw) ×6 IMPLANT
SPACER BONE CORNERSTONE 8X14 (Orthopedic Implant) ×3 IMPLANT
SPONGE INTESTINAL PEANUT (DISPOSABLE) ×3 IMPLANT
STRIP CLOSURE SKIN 1/2X4 (GAUZE/BANDAGES/DRESSINGS) ×2 IMPLANT
SUT VIC AB 3-0 SH 8-18 (SUTURE) ×3 IMPLANT
SYR 20ML ECCENTRIC (SYRINGE) ×3 IMPLANT
TOWEL OR 17X24 6PK STRL BLUE (TOWEL DISPOSABLE) ×3 IMPLANT
TOWEL OR 17X26 10 PK STRL BLUE (TOWEL DISPOSABLE) ×3 IMPLANT
TRAP SPECIMEN MUCOUS 40CC (MISCELLANEOUS) IMPLANT
WATER STERILE IRR 1000ML POUR (IV SOLUTION) ×3 IMPLANT

## 2014-07-29 NOTE — Op Note (Signed)
07/29/2014  9:18 AM  PATIENT:  Mark Dunlap  53 y.o. 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 Anterior cervical arthrodesis C6-7 utilizing a cortical cancellus allograft, 2. Anterior cervical plating C6-7 utilizing a Nuvasive archon plate, 3. Cervical exploration and removal of anterior cervical plating C3-C6  SURGEON:  Sherley Bounds, MD  ASSISTANTS: None  ANESTHESIA:   General  EBL: Less than 20 ml  Total I/O In: 1000 [I.V.:1000] Out: -   BLOOD ADMINISTERED:none  DRAINS: 7 flat JP   SPECIMEN:  No Specimen  INDICATION FOR PROCEDURE: This patient underwent previous anterior cervical discectomy fusion plating C3-C6. He then underwent a posterior cervical fusion C3-C7. He presented with recurrent neck and bilateral arm pain. He had loosening of hardware at C7 with a pseudoarthrosis at C6-7 posteriorly. Recommended anterior cervical discectomy fusion with plating. Patient understood the risks, benefits, and alternatives and potential outcomes and wished to proceed.  PROCEDURE DETAILS: Patient was brought to the operating room placed under general endotracheal anesthesia. Patient was placed in the supine position on the operating room table. The neck was prepped with Duraprep and draped in a sterile fashion.   Three cc of local anesthesia was injected and a transverse incision was made on the right side of the neck.  Dissection was carried down thru the subcutaneous tissue and the platysma was  elevated, opened, and undermined with Metzenbaum scissors.  Dissection was then carried out thru an avascular plane leaving the sternocleidomastoid carotid artery and jugular vein laterally and the trachea and esophagus medially. The ventral aspect of the vertebral column was identified and a localizing x-ray was taken. The C6-7 level was identified. Blunt dissection was used to then uncovered the  existing C3-C6 anterior cervical instrumentation. The screws were removed from each vertebral body and then the plate was removed. The longus colli muscles were then elevated and the retractor was placed to expose C6-7. The annulus was incised and the disc space entered. Discectomy was performed with micro-curettes and pituitary rongeurs. I then used the high-speed drill to drill the endplates down to the level of the posterior longitudinal ligament. No decompression was necessary as he did not have spinal stenosis. We then measured the height of the intravertebral disc space and selected a 8 mm cortical cancellus allograft. It was then gently positioned in the intravertebral disc space and countersunk. I then used a 24 mm archon plate and placed four variable angle screws into the vertebral bodies and locked them into position. The wound was irrigated with bacitracin solution, checked for hemostasis which was established and confirmed. Once meticulous hemostasis was achieved, I placed a 7 flat JP drain and we then proceeded with closure. The platysma was closed with interrupted 3-0 undyed Vicryl suture, the subcuticular layer was closed with interrupted 3-0 undyed Vicryl suture. The skin edges were approximated with steristrips. The drapes were removed. A sterile dressing was applied. The patient was then awakened from general anesthesia and transferred to the recovery room in stable condition. At the end of the procedure all sponge, needle and instrument counts were correct.   PLAN OF CARE: Admit for overnight observation  PATIENT DISPOSITION:  PACU - hemodynamically stable.   Delay start of Pharmacological VTE agent (>24hrs) due to surgical blood loss or risk of bleeding:  yes

## 2014-07-29 NOTE — H&P (Signed)
Subjective:   Patient is a 53 y.o. male admitted for ACDF. The patient first presented to me with complaints of neck pain. Onset of symptoms was a few months ago. He has had multiple neck surgeries. The pain is described as aching and occurs constantly. The pain is rated severe, and is located  In the neck and radiates to the arms. The symptoms have been progressive. Symptoms are exacerbated by extension, and are relieved by meds.  Previous work up includes CT/myelo.  Past Medical History  Diagnosis Date  . THYROID NODULE, RIGHT 03/02/2010  . Adjustment disorder with depressed mood 09/2007  . 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 HYPOTENSION 10/31/2009  . Acute prostatitis 05/23/2009  . SPINAL STENOSIS, CERVICAL 08/08/2009  . H/O: substance abuse     hx of ETOH/Crack cocaine-none since 11/08 per pt/Does not Drive due to this  . DJD (degenerative joint disease) of knee     left knee  . DJD (degenerative joint disease) of hip   . HYPERTENSION 08/30/2008  . CERVICAL RADICULOPATHY, LEFT 08/30/2008  . Other and unspecified hyperlipidemia 08/20/2013  . HYPERTHYROIDISM 02/13/2010    pt was told by  Dr Jenny Reichmann  that thyroid was now back to normal .. 2012 ...   . Seasonal allergies   . Legally blind in left eye, as defined in [place]     Blind Left eye, small amt vision Right eye    Past Surgical History  Procedure Laterality Date  . Right hand i&d  12/07    s/p rdue to abscess-Dr. Amedeo Plenty  . Anterior cervical decomp/discectomy fusion  10/11/2011    Procedure: ANTERIOR CERVICAL DECOMPRESSION/DISCECTOMY FUSION 3 LEVELS;  Surgeon: Eustace Moore;  Location: [facility];  Service: Neurosurgery;  Laterality: N/A;  Cervical three-four ,cervical four five cervical five six Anterior Cervical Decompression Fusion with peek + plate Nuvasive translational plate Orthofix peek (2 1/2 hours) Rm # [id number]  . Total hip arthroplasty  12/17/2011    Procedure: TOTAL HIP  ARTHROPLASTY;  Surgeon: Johnny Bridge, MD;  Location: [facility];  Service: Orthopedics;  Laterality: Left;  . Posterior cervical fusion/foraminotomy N/A 09/23/2013    Procedure: CERVICALTWO TO CERVICAL SEVEN POSTERIOR CERVICAL FUSION/FORAMINOTOMY WITH LATERAL MASS FIXATION;  Surgeon: Eustace Moore, MD;  Location: [facility];  Service: Neurosurgery;  Laterality: N/A;    Allergies  Allergen Reactions  . Hydrocodone Itching and Other (See Comments)    10 -325 makes him itch but 10-500 doesn't  . Tramadol Itching  . Morphine And Related Itching    History  Substance Use Topics  . Smoking status: Current Every Day Smoker -- 1.00 packs/day for 40 years    Types: Cigarettes  . Smokeless tobacco: Not on file     Comment: last crack over 1 yr   alcohol last time few yrs  . Alcohol Use: No     Comment: hx of alcohol abuse    Family History  Problem Relation Age of Onset  . Arthritis Mother   . Hyperlipidemia Mother   . Goiter Mother     resection of benign goiter  . Alcohol abuse Father   . Stroke Father   . Heart disease Father   . Hypertension Sister   . Diabetes Sister   . Goiter Sister     resection of benign goiter  . Diabetes Brother   . Alcohol abuse Brother   . Alcohol abuse Cousin   .  Heart disease Other     Aunt   Prior to Admission medications   Medication Sig Start Date End Date Taking? Authorizing Provider  adapalene (DIFFERIN) 0.1 % gel Apply 1 application topically at bedtime. 06/08/14  Yes Biagio Borg, MD  amLODipine (NORVASC) 5 MG tablet Take 1 tablet (5 mg total) by mouth daily. 07/06/14 07/06/15 Yes Biagio Borg, MD  aspirin 81 MG tablet Take 81 mg by mouth daily.     Yes Historical Provider, MD  Benzoyl Peroxide 5.5 % CREA Apply 1 application topically daily.   Yes Historical Provider, MD  bimatoprost (LUMIGAN) 0.01 % SOLN Place 1 drop into both eyes at bedtime.   Yes Historical Provider, MD  carboxymethylcellulose (REFRESH PLUS) 0.5 % SOLN Place 1 drop into both  eyes 3 (three) times daily as needed (keep eyes clear).    Yes Historical Provider, MD  diphenhydrAMINE (BENADRYL) 25 mg capsule Take 75 mg by mouth every 6 (six) hours as needed for itching.   Yes Historical Provider, MD  fluticasone (FLONASE) 50 MCG/ACT nasal spray Place 2 sprays into both nostrils daily.   Yes Historical Provider, MD  HYDROcodone-acetaminophen (NORCO) 10-325 MG per tablet Take 1 tablet by mouth every 6 (six) hours as needed for moderate pain.   Yes Historical Provider, MD  methocarbamol (ROBAXIN) 500 MG tablet Take 1 tablet (500 mg total) by mouth every 6 (six) hours as needed for muscle spasms. 09/25/13  Yes Kristeen Miss, MD  minocycline (MINOCIN,DYNACIN) 100 MG capsule Take 1 capsule (100 mg total) by mouth 2 (two) times daily. 06/08/14  Yes Biagio Borg, MD  Multiple Vitamins-Minerals (MULTIVITAMIN WITH MINERALS) tablet Take 1 tablet by mouth daily.   Yes Historical Provider, MD  ondansetron (ZOFRAN) 4 MG tablet Take 4 mg by mouth every 8 (eight) hours as needed for nausea or vomiting.   Yes Historical Provider, MD  zolpidem (AMBIEN) 10 MG tablet Take 1 tablet (10 mg total) by mouth at bedtime as needed for sleep. 06/08/14  Yes Biagio Borg, MD     Review of Systems  Positive ROS: neg  All other systems have been reviewed and were otherwise negative with the exception of those mentioned in the HPI and as above.  Objective: Vital signs in last 24 hours: Temp:  [96.8 F (36 C)] 96.8 F (36 C) (10/16 0556) Pulse Rate:  [78] 78 (10/16 0556) Resp:  [18] 18 (10/16 0556) BP: (132)/(82) 132/82 mmHg (10/16 0556) SpO2:  [100 %] 100 % (10/16 0556) Weight:  [74.844 kg (165 lb)] 74.844 kg (165 lb) (10/16 0556)  General Appearance: Alert, cooperative, no distress, appears stated age Head: Normocephalic, without obvious abnormality, atraumatic Eyes: PERRL, conjunctiva/corneas clear, EOM's intact      Neck: Supple, symmetrical, trachea midline, Back: Symmetric, no curvature, ROM  normal, no CVA tenderness Lungs:  respirations unlabored Heart: Regular rate and rhythm Abdomen: Soft, non-tender Extremities: Extremities normal, atraumatic, no cyanosis or edema Pulses: 2+ and symmetric all extremities Skin: Skin color, texture, turgor normal, no rashes or lesions  NEUROLOGIC:  Mental status: Alert and oriented x4, no aphasia, good attention span, fund of knowledge and memory  Motor Exam - grossly normal Sensory Exam - grossly normal Reflexes: 1+ Coordination - grossly normal Gait - grossly normal Balance - grossly normal Cranial Nerves: I: smell Not tested  II: visual acuity  OS: nl    OD: nl  II: visual fields Full to confrontation  II: pupils Equal, round, reactive to light  III,VII: ptosis None  III,IV,VI: extraocular muscles  Full ROM  V: mastication Normal  V: facial light touch sensation  Normal  V,VII: corneal reflex  Present  VII: facial muscle function - upper  Normal  VII: facial muscle function - lower Normal  VIII: hearing Not tested  IX: soft palate elevation  Normal  IX,X: gag reflex Present  XI: trapezius strength  5/5  XI: sternocleidomastoid strength 5/5  XI: neck flexion strength  5/5  XII: tongue strength  Normal    Data Review Lab Results  Component Value Date   WBC 7.6 07/22/2014   HGB 13.6 07/22/2014   HCT 41.2 07/22/2014   MCV 90.0 07/22/2014   PLT 340 07/22/2014   Lab Results  Component Value Date   NA 140 07/22/2014   K 4.2 07/22/2014   CL 102 07/22/2014   CO2 26 07/22/2014   BUN 9 07/22/2014   CREATININE 0.95 07/22/2014   GLUCOSE 91 07/22/2014   Lab Results  Component Value Date   INR 0.96 07/22/2014    Assessment:   Cervical neck pain with herniated nucleus pulposus/ spondylosis/ stenosis at C6-7. Patient has failed conservative therapy. Planned surgery : ACDF C6-7  Plan:   I explained the condition and procedure to the patient and answered any questions.  Patient wishes to proceed with procedure as planned.  Understands risks/ benefits/ and expected or typical outcomes.  Mark Dunlap S 07/29/2014 6:34 AM

## 2014-07-29 NOTE — Plan of Care (Signed)
Problem: Consults Goal: Diagnosis - Spinal Surgery Outcome: Completed/Met Date Met:  07/29/14 Cervical Spine Fusion . Column fused --Anterior (rounded, smooth portion of spine) --Posterior (pedicle, lamina, facet, transverse process of spine) . Approach --Anterior --Lateral --Posterolateral --Posterior --Lateral transverse . Type of device(s) used --Interbody fusion device --Autologous bone graft --Nonautologous bone graft --Etc. . Number of joints fused --L1-L3 --L5-S1 --Etc.  Supporting Information:   Thank You,

## 2014-07-29 NOTE — Anesthesia Procedure Notes (Signed)
Procedure Name: Intubation Date/Time: 07/29/2014 7:39 AM Performed by: Marinda Elk A Pre-anesthesia Checklist: Patient identified, Timeout performed, Emergency Drugs available, Suction available and Patient being monitored Patient Re-evaluated:Patient Re-evaluated prior to inductionOxygen Delivery Method: Circle system utilized Preoxygenation: Pre-oxygenation with 100% oxygen Intubation Type: IV induction Ventilation: Mask ventilation without difficulty Laryngoscope Size: Mac and 3 Grade View: Grade II Tube type: Oral Tube size: 7.5 mm Number of attempts: 1 Airway Equipment and Method: Stylet Placement Confirmation: ETT inserted through vocal cords under direct vision,  positive ETCO2 and breath sounds checked- equal and bilateral Secured at: 22 cm Tube secured with: Tape Dental Injury: Teeth and Oropharynx as per pre-operative assessment

## 2014-07-29 NOTE — Progress Notes (Signed)
Utilization review completed.  

## 2014-07-29 NOTE — Transfer of Care (Signed)
Immediate Anesthesia Transfer of Care Note  Patient: Mark Dunlap  Procedure(s) Performed: Procedure(s) with comments: ANTERIOR CERVICAL DECOMPRESSION/DISCECTOMY FUSION 1 LEVEL/HARDWARE REMOVAL (N/A) - cervical six-seven  Patient Location: PACU  Anesthesia Type:General  Level of Consciousness: awake  Airway & Oxygen Therapy: Patient Spontanous Breathing and Patient connected to nasal cannula oxygen  Post-op Assessment: Report given to PACU RN and Post -op Vital signs reviewed and stable  Post vital signs: Reviewed and stable  Complications: No apparent anesthesia complications

## 2014-07-29 NOTE — Anesthesia Postprocedure Evaluation (Signed)
  Anesthesia Post-op Note  Patient: Mark Dunlap  Procedure(s) Performed: Procedure(s) with comments: ANTERIOR CERVICAL DECOMPRESSION/DISCECTOMY FUSION 1 LEVEL/HARDWARE REMOVAL (N/A) - cervical six-seven  Patient Location: PACU  Anesthesia Type:General  Level of Consciousness: awake, alert , oriented and patient cooperative  Airway and Oxygen Therapy: Patient Spontanous Breathing  Post-op Pain: mild  Post-op Assessment: Post-op Vital signs reviewed, Patient's Cardiovascular Status Stable, Respiratory Function Stable, Patent Airway and Pain level controlled, nausea improved  Post-op Vital Signs: Reviewed and stable  Last Vitals:  Filed Vitals:   07/29/14 1044  BP: 111/75  Pulse: 74  Temp:   Resp: 16    Complications: No apparent anesthesia complications

## 2014-07-29 NOTE — Anesthesia Preprocedure Evaluation (Addendum)
Anesthesia Evaluation  Patient identified by MRN, date of birth, ID band Patient awake    Reviewed: Allergy & Precautions, H&P , NPO status , Patient's Chart, lab work & pertinent test results  History of Anesthesia Complications Negative for: history of anesthetic complications  Airway Mallampati: I TM Distance: >3 FB Neck ROM: Full    Dental  (+) Edentulous Upper, Poor Dentition, Dental Advisory Given, Missing   Pulmonary Current Smoker,  breath sounds clear to auscultation        Cardiovascular hypertension, Pt. on medications - anginaRhythm:Regular Rate:Normal  '14 stress test: normal   Neuro/Psych    GI/Hepatic Neg liver ROS, GERD-  Medicated and Controlled,(+)     substance abuse (quit x2)  alcohol use and cocaine use,   Endo/Other  Hyperthyroidism   Renal/GU      Musculoskeletal   Abdominal   Peds  Hematology   Anesthesia Other Findings   Reproductive/Obstetrics                        Anesthesia Physical Anesthesia Plan  ASA: II  Anesthesia Plan: General   Post-op Pain Management:    Induction: Intravenous  Airway Management Planned: Oral ETT  Additional Equipment:   Intra-op Plan:   Post-operative Plan: Extubation in OR  Informed Consent: I have reviewed the patients History and Physical, chart, labs and discussed the procedure including the risks, benefits and alternatives for the proposed anesthesia with the patient or authorized representative who has indicated his/her understanding and acceptance.   Dental advisory given  Plan Discussed with: CRNA and Surgeon  Anesthesia Plan Comments: (Plan routine monitors, GETA)        Anesthesia Quick Evaluation

## 2014-07-30 DIAGNOSIS — M4722 Other spondylosis with radiculopathy, cervical region: Secondary | ICD-10-CM | POA: Diagnosis not present

## 2014-07-30 MED ORDER — HYDROCODONE-ACETAMINOPHEN 10-325 MG PO TABS: 1.0000 | ORAL_TABLET | Freq: Four times a day (QID) | ORAL | Status: DC | PRN

## 2014-07-30 MED ORDER — ASPIRIN 81 MG PO TABS
81.0000 mg | ORAL_TABLET | Freq: Every day | ORAL | Status: DC
Start: 2014-08-08 — End: 2018-10-23

## 2014-07-30 NOTE — Discharge Summary (Signed)
  Physician Discharge Summary  Patient ID: CHIVAS NOTZ MRN: [id number] DOB/AGE: [birth year] 53 y.o.  Admit date: 07/29/2014 Discharge date: 07/30/2014  Admission Diagnoses: Cervical spondylosis with radiculopathy  Discharge Diagnoses: Same Active Problems:   S/P cervical spinal fusion   Discharged Condition: Stable  Hospital Course:  Mrs. AUNDREA HIGGINBOTHAM is a 53 y.o. male electively admitted after cervical fusion. Postoperatively, he was at his baseline with good strength, tolerating diet, ambulating, and voiding normally.  Treatments: Surgery - ACDF C67  Discharge Exam: Blood pressure 127/83, pulse 97, temperature 98.2 F (36.8 C), temperature source Oral, resp. rate 18, weight 74.844 kg (165 lb), SpO2 97.00%. Awake, alert, oriented Speech fluent, appropriate CN grossly intact 5/5 BUE/BLE Wound c/d/i  Follow-up: Follow-up in Dr. Ronnald Ramp' office [facility] [phone]) in 2-3 weeks  Disposition: 01-Home or Self Care     Medication List         adapalene 0.1 % gel  Commonly known as:  DIFFERIN  Apply 1 application topically at bedtime.     amLODipine 5 MG tablet  Commonly known as:  NORVASC  Take 1 tablet (5 mg total) by mouth daily.     aspirin 81 MG tablet  Take 1 tablet (81 mg total) by mouth daily.  Start taking on:  08/08/2014     Benzoyl Peroxide 5.5 % Crea  Apply 1 application topically daily.     bimatoprost 0.01 % Soln  Commonly known as:  LUMIGAN  Place 1 drop into both eyes at bedtime.     carboxymethylcellulose 0.5 % Soln  Commonly known as:  REFRESH PLUS  Place 1 drop into both eyes 3 (three) times daily as needed (keep eyes clear).     diphenhydrAMINE 25 mg capsule  Commonly known as:  BENADRYL  Take 75 mg by mouth every 6 (six) hours as needed for itching.     fluticasone 50 MCG/ACT nasal spray  Commonly known as:  FLONASE  Place 2 sprays into both nostrils daily.     HYDROcodone-acetaminophen 10-325 MG  per tablet  Commonly known as:  NORCO  Take 1 tablet by mouth every 6 (six) hours as needed for moderate pain.     methocarbamol 500 MG tablet  Commonly known as:  ROBAXIN  Take 1 tablet (500 mg total) by mouth every 6 (six) hours as needed for muscle spasms.     minocycline 100 MG capsule  Commonly known as:  MINOCIN,DYNACIN  Take 1 capsule (100 mg total) by mouth 2 (two) times daily.     multivitamin with minerals tablet  Take 1 tablet by mouth daily.     ondansetron 4 MG tablet  Commonly known as:  ZOFRAN  Take 4 mg by mouth every 8 (eight) hours as needed for nausea or vomiting.     zolpidem 10 MG tablet  Commonly known as:  AMBIEN  Take 1 tablet (10 mg total) by mouth at bedtime as needed for sleep.         SignedConsuella Lose, C 07/30/2014, 8:34 AM

## 2014-07-30 NOTE — Discharge Instructions (Signed)
Wound Care Keep incision covered and dry for one week.  If you shower prior to then, cover incision with plastic wrap.  You may remove outer bandage after one week and shower.  Do not put any creams, lotions, or ointments on incision. Leave steri-strips on neck.  They will fall off by themselves. Activity Walk each and every day, increasing distance each day. No lifting greater than 5 lbs.  Avoid excessive neck motion. No driving for 2 weeks; may ride as a passenger locally. Wear neck brace at all times except when showering or otherwise instructed. Diet Resume your normal diet.  Return to Work Will be discussed at you follow up appointment. Call Your Doctor If Any of These Occur Redness, drainage, or swelling at the wound.  Temperature greater than 101 degrees. Severe pain not relieved by pain medication. Increased difficulty swallowing.  Incision starts to come apart. Follow Up Appt Call today for appointment in 1-2 weeks [phone]) or for problems.  If you have any hardware placed in your spine, you will need an x-ray before your appointment.   Anterior Cervical Diskectomy and Fusion Anterior cervical diskectomy is surgery done on the upper spine to relieve pressure on one or more nerve roots, or on the spinal cord. There are 7 bones in your neck, called the cervical spine. These 7 bones (vertebrae) sit one on top of the other. Cushions (intervertebral disks) separate the vertebrae and act like shock absorbers. As we age, degeneration of our bones, joints, and disks can cause neck pain and tightening around the spinal cord and nerve roots. This causes arm pain and weakness.  Degeneration involves:  Herniated Disk. With age, the disks dry up and can rupture. In this condition, the center of the disk bulges out (disk herniation). This can cause pressure on a nerve, which produces pain or weakness in the arm.  Bone spurs and spinal stenosis. As we age, growths often develop on our  bones. These growths are called bone spurs (osteophytes). A bone spur is a collection of calcium. As bone spurs grow and extend, the vertebral openings become narrow. The spinal canal and/or the foramen (opening for nerve passageways) become smaller. This narrowing (stenosis) may cause pinching (compression) of the spinal cord or the spinal nerve root. The nerve injury can cause pain, weakness, numbness, and loss of coordination in the upper limbs. Often, patients have difficulty with their hand writing or they start dropping things, because their hand grip is weaker. The spinal cord damage can cause increased stiffness, more frequent falls, electric shooting pain, and changes in bowel and bladder control. Degeneration in the neck results in three common problems:  Radiculopathy - Nerve compression that results in weakness or pain that radiates down the arm.  Myelopathy - Spinal cord compression that causes stiffness, difficulty with walking, coordination, and trouble with bowel or bladder habits.  Neck pain - Worn out joints cause pain as the neck moves. Treatment:  Radiculopathy - Surgery is performed to remove the bony and disk material that is pushing on the nerve.  Myelopathy - Surgery is performed to remove the bony and disk material that pushes on the spinal cord.  Neck pain - Surgery is performed to combine (fuse) the joints of the neck together, so they cannot move or cause pain. Surgery can be done from the front or the back of the neck. When it is done from the front, it is called an anterior (front) cervical (neck) diskectomy (removal of the disk) and  fusion. LET YOUR CAREGIVER KNOW ABOUT:   Recent infections.  Any shooting pains down your leg, when you move your neck.  Any difficulty swallowing.  A smoking history.  Use of blood thinners or anti-inflammatory medicines.  Any history of injury to your shoulders.  Any history of injury to your vocal cords.  Any foreign  objects in your body from a previous surgery.  Any recent fevers or illness.  Past medical history (diabetes, strokes).  Past problems with anesthetics.  Possibility of pregnancy.  History of blood clots (deep vein thrombosis).  History of bleeding or blood problems.  Past surgeries.  Other health problems.  Allergies.  Medicines you take, including herbs, eye drops, over-the-counter medicines, and creams.  Use of steroids (by mouth or creams). RISKS AND COMPLICATIONS  Infection.  Bleeding.  Injury to the following structures:  Carotid artery. This can result in a stroke or significant amount of bleeding.  Esophagus, resulting in difficulty swallowing.  Recurrent laryngeal nerve, resulting in hoarseness of the voice.  Spinal cord injury, ranging from mild to complete quadriparesis (muscle weakness in all four limbs).  Nerve root injury, resulting in muscle weakness in the upper limb.  Leakage of cerebrospinal fluid. BEFORE THE PROCEDURE   You will be given medicine to help you sleep (general anesthetic), and a breathing tube will be placed.  You will be given antibiotics to keep the infection rate down.  The incision site on your neck will be marked.  Your neck will be cleaned, to reduce the risk of infection. PROCEDURE  An anterior cervical fusion means that the operation is done through the front (anterior) part of your neck. The cut made by the surgeon (incision) is usually within a skin fold line on the neck. After pushing aside the neck muscles, the surgeon removes the affected, degenerated disk and bone spurs (osteophytes), which takes the pressure off the nerves and spinal cord. This is called a decompression. The area where the disk was removed is then filled with a small piece of plastic. This plastic takes the place of the disk and keeps the nerve passageway (foramen) open and clear for the nerves. In most cases, the surgeon uses metal plates or pins  (hardware) in the neck, to help stabilize the level being fused. The hardware reduces motion at that level, so it can fuse. This provides extra support to the neck. A cervical fusion procedure takes anywhere from a couple to several hours, depending on the size of the neck, history of previous surgery, and number of levels being fused. AFTER THE PROCEDURE   You will likely spend 24-48 hours in the hospital. During this time, your caregivers will look for any signs of complications from the procedure.  Your caregiver will watch you, to make sure that fluid draining from the surgery slows down. It is important that a large mass of blood does not form in your neck, which would cause difficulty with breathing.  You will get 24 hours of antibiotics.  You can start to eat as soon as you feel comfortable.  Once you have started eating, walking, urinating (voiding) and having bowel movements on your own, your caregiver will discharge you home. HOME CARE INSTRUCTIONS   For 2 weeks, do not soak the incision site under water. Do not swim or take baths. Showers are okay, but rinse off the incision sites.  Do not over exert yourself. Allow time for the incision to heal.  It can take from 6 weeks to  6 months for fusion to take effect. Your caregiver may ask you to wear a neck collar during this time, as they check the fusion with multiple (serial) X-rays. Document Released: 09/18/2009 Document Revised: 01/25/2013 Document Reviewed: 09/18/2009 [facility] Patient Information 2015 [place], [place]. This information is not intended to replace advice given to you by your health care provider. Make sure you discuss any questions you have with your health care provider.

## 2014-07-30 NOTE — Progress Notes (Signed)
Pt given D/C instructions with Rx's, verbal understanding was provided. Pt's incision was clean and dry with no sign of infection. Pt's IV was removed prior to D/C. Pt D/C'd home via wheelchair @ [phone] per MD order. Pt is stable @ D/C and has no other needs. Holli Humbles, RN

## 2014-08-02 ENCOUNTER — Encounter (HOSPITAL_COMMUNITY): Payer: Self-pay | Admitting: Neurological Surgery

## 2014-08-03 ENCOUNTER — Telehealth: Payer: Self-pay

## 2014-08-03 ENCOUNTER — Ambulatory Visit (INDEPENDENT_AMBULATORY_CARE_PROVIDER_SITE_OTHER): Payer: Medicare Other | Admitting: Internal Medicine

## 2014-08-03 ENCOUNTER — Other Ambulatory Visit (INDEPENDENT_AMBULATORY_CARE_PROVIDER_SITE_OTHER): Payer: Medicare Other

## 2014-08-03 ENCOUNTER — Encounter: Payer: Self-pay | Admitting: Internal Medicine

## 2014-08-03 VITALS — BP 110/72 | HR 102 | Temp 98.1°F | Wt 162.8 lb

## 2014-08-03 DIAGNOSIS — N410 Acute prostatitis: Secondary | ICD-10-CM

## 2014-08-03 DIAGNOSIS — N289 Disorder of kidney and ureter, unspecified: Secondary | ICD-10-CM

## 2014-08-03 DIAGNOSIS — I10 Essential (primary) hypertension: Secondary | ICD-10-CM

## 2014-08-03 DIAGNOSIS — Z981 Arthrodesis status: Secondary | ICD-10-CM

## 2014-08-03 LAB — URINALYSIS, ROUTINE W REFLEX MICROSCOPIC
Bilirubin Urine: NEGATIVE
HGB URINE DIPSTICK: NEGATIVE
Ketones, ur: NEGATIVE
LEUKOCYTES UA: NEGATIVE
NITRITE: NEGATIVE
Specific Gravity, Urine: <=1.005 — AB (ref 1.000–1.030)
Total Protein, Urine: NEGATIVE
UROBILINOGEN UA: 0.2 (ref 0.0–1.0)
Urine Glucose: NEGATIVE
pH: 6 (ref 5.0–8.0)

## 2014-08-03 MED ORDER — METHOCARBAMOL 500 MG PO TABS: 500.0000 mg | ORAL_TABLET | Freq: Four times a day (QID) | ORAL | Status: DC | PRN

## 2014-08-03 MED ORDER — DOXYCYCLINE HYCLATE 100 MG PO TABS: 100.0000 mg | ORAL_TABLET | Freq: Two times a day (BID) | ORAL | Status: DC

## 2014-08-03 NOTE — Addendum Note (Signed)
Addended by: Biagio Borg on: 08/03/2014 03:54 PM   Modules accepted: Orders

## 2014-08-03 NOTE — Assessment & Plan Note (Signed)
With recent lower post neck discomfort, ok for muscle relaxer prn, cont current pain med,  to f/u any worsening symptoms or concerns

## 2014-08-03 NOTE — Patient Instructions (Signed)
Please take all new medication as prescribed - the muscle relaxer as needed, and the antibiotic  Please continue all other medications as before, and refills have been done if requested  Please have the pharmacy call with any other refills you may need.  Please keep your appointments with your specialists as you may have planned  Please go to the LAB in the Basement (turn left off the elevator) for the tests to be done today - just the urine test today  You will be contacted by phone if any changes need to be made immediately.  Otherwise, you will receive a letter about your results with an explanation, but please check with MyChart first.  Please remember to sign up for MyChart if you have not done so, as this will be important to you in the future with finding out test results, communicating by private email, and scheduling acute appointments online when needed.  Please return in 6 months, or sooner if needed

## 2014-08-03 NOTE — Telephone Encounter (Signed)
The patient has been getting reminders to have a colonoscopy done.  He would like to know when is the next one due.

## 2014-08-03 NOTE — Assessment & Plan Note (Signed)
Improved with d/c ace

## 2014-08-03 NOTE — Telephone Encounter (Signed)
My records states not due until June 2016  I would think it OK for pt to contact GI to ask directly if he would like further clarified

## 2014-08-03 NOTE — Assessment & Plan Note (Addendum)
Improved, stable on amlod 5 qd et al  BP Readings from Last 3 Encounters:  08/03/14 110/72  07/30/14 127/83  07/30/14 127/83

## 2014-08-03 NOTE — Assessment & Plan Note (Signed)
Recurrent, for UA, and repeat doxy course as before

## 2014-08-03 NOTE — Progress Notes (Signed)
Pre visit review using our clinic review tool, if applicable. No additional management support is needed unless otherwise documented below in the visit note. 

## 2014-08-03 NOTE — Progress Notes (Signed)
   Subjective:    Patient ID: Mark Dunlap, male    DOB: 01-25-1961, 53 y.o.   MRN: [id number]  HPI    Review of Systems     Objective:   Physical Exam        Assessment & Plan:

## 2014-08-03 NOTE — Progress Notes (Signed)
Subjective:    Patient ID: Mark Dunlap, male    DOB: 12-29-1960, 53 y.o.   MRN: [id number]  HPI  Here 4 days post hosp d/c aftrer cervical fusion, c/o pain but has norco at home, works ok,  Geologist, engineering for [employer].  C/o recurrent urinary freq and prostatism symptoms, nocturia x 5, similar to situation June 2015 when tx with one mo doxy course.  No fever, abd pain.  Denies urinary symptoms such as dysuria, flank pain, hematuria or n/v, fever, chills.  Most recent cr .95, much improved from cr 1.5 late aug 2015.  Pt denies chest pain, increased sob or doe, wheezing, orthopnea, PND, increased LE swelling, palpitations, dizziness or syncope.   Past Medical History  Diagnosis Date  . THYROID NODULE, RIGHT 03/02/2010  . Adjustment disorder with depressed mood 09/2007  . 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 HYPOTENSION 10/31/2009  . Acute prostatitis 05/23/2009  . SPINAL STENOSIS, CERVICAL 08/08/2009  . H/O: substance abuse     hx of ETOH/Crack cocaine-none since 11/08 per pt/Does not Drive due to this  . DJD (degenerative joint disease) of knee     left knee  . DJD (degenerative joint disease) of hip   . HYPERTENSION 08/30/2008  . CERVICAL RADICULOPATHY, LEFT 08/30/2008  . Other and unspecified hyperlipidemia 08/20/2013  . HYPERTHYROIDISM 02/13/2010    pt was told by  Dr Jenny Reichmann  that thyroid was now back to normal .. 2012 ...   . Seasonal allergies   . Legally blind in left eye, as defined in [place]     Blind Left eye, small amt vision Right eye   Past Surgical History  Procedure Laterality Date  . Right hand i&d  12/07    s/p rdue to abscess-Dr. Amedeo Plenty  . Anterior cervical decomp/discectomy fusion  10/11/2011    Procedure: ANTERIOR CERVICAL DECOMPRESSION/DISCECTOMY FUSION 3 LEVELS;  Surgeon: Eustace Moore;  Location: [place] [facility];  Service: Neurosurgery;  Laterality: N/A;  Cervical three-four ,cervical four five cervical five six Anterior  Cervical Decompression Fusion with peek + plate Nuvasive translational plate Orthofix peek (2 1/2 hours) Rm # 32  . Total hip arthroplasty  12/17/2011    Procedure: TOTAL HIP ARTHROPLASTY;  Surgeon: Johnny Bridge, MD;  Location: [facility];  Service: Orthopedics;  Laterality: Left;  . Posterior cervical fusion/foraminotomy N/A 09/23/2013    Procedure: CERVICALTWO TO CERVICAL SEVEN POSTERIOR CERVICAL FUSION/FORAMINOTOMY WITH LATERAL MASS FIXATION;  Surgeon: Eustace Moore, MD;  Location: [place] [facility];  Service: Neurosurgery;  Laterality: N/A;  . Anterior cervical decomp/discectomy fusion N/A 07/29/2014    Procedure: ANTERIOR CERVICAL DECOMPRESSION/DISCECTOMY FUSION 1 LEVEL/HARDWARE REMOVAL;  Surgeon: Eustace Moore, MD;  Location: [place] [facility];  Service: Neurosurgery;  Laterality: N/A;  cervical six-seven    reports that he has been smoking Cigarettes.  He has a 40 pack-year smoking history. He does not have any smokeless tobacco history on file. He reports that he uses illicit drugs ("Crack" cocaine). He reports that he does not drink alcohol. family history includes Alcohol abuse in his brother, cousin, and father; Arthritis in his mother; Diabetes in his brother and sister; Goiter in his mother and sister; Heart disease in his father and other; Hyperlipidemia in his mother; Hypertension in his sister; Stroke in his father. Allergies  Allergen Reactions  . Hydrocodone Itching and Other (See Comments)    10 -325 makes him itch but 10-500  doesn't  . Tramadol Itching  . Morphine And Related Itching   Current Outpatient Prescriptions on File Prior to Visit  Medication Sig Dispense Refill  . adapalene (DIFFERIN) 0.1 % gel Apply 1 application topically at bedtime.  45 g  0  . amLODipine (NORVASC) 5 MG tablet Take 1 tablet (5 mg total) by mouth daily.  90 tablet  3  . [START ON 08/08/2014] aspirin 81 MG tablet Take 1 tablet (81 mg total) by mouth daily.  30 tablet    . Benzoyl Peroxide 5.5 % CREA Apply  1 application topically daily.      . bimatoprost (LUMIGAN) 0.01 % SOLN Place 1 drop into both eyes at bedtime.      . carboxymethylcellulose (REFRESH PLUS) 0.5 % SOLN Place 1 drop into both eyes 3 (three) times daily as needed (keep eyes clear).       . diphenhydrAMINE (BENADRYL) 25 mg capsule Take 75 mg by mouth every 6 (six) hours as needed for itching.      . fluticasone (FLONASE) 50 MCG/ACT nasal spray Place 2 sprays into both nostrils daily.      [name] HYDROcodone-acetaminophen (NORCO) 10-325 MG per tablet Take 1 tablet by mouth every 6 (six) hours as needed for moderate pain.  60 tablet  0  . methocarbamol (ROBAXIN) 500 MG tablet Take 1 tablet (500 mg total) by mouth every 6 (six) hours as needed for muscle spasms.  60 tablet  3  . minocycline (MINOCIN,DYNACIN) 100 MG capsule Take 1 capsule (100 mg total) by mouth 2 (two) times daily.  60 capsule  0  . Multiple Vitamins-Minerals (MULTIVITAMIN WITH MINERALS) tablet Take 1 tablet by mouth daily.      . ondansetron (ZOFRAN) 4 MG tablet Take 4 mg by mouth every 8 (eight) hours as needed for nausea or vomiting.      [name] zolpidem (AMBIEN) 10 MG tablet Take 1 tablet (10 mg total) by mouth at bedtime as needed for sleep.  30 tablet  5   No current facility-administered medications on file prior to visit.   Review of Systems  Constitutional: Negative for unusual diaphoresis or other sweats  HENT: Negative for ringing in ear Eyes: Negative for double vision or worsening visual disturbance.  Respiratory: Negative for choking and stridor.   Gastrointestinal: Negative for vomiting or other signifcant bowel change Genitourinary: Negative for hematuria or decreased urine volume.  Musculoskeletal: Negative for other MSK pain or swelling Skin: Negative for color change and worsening wound.  Neurological: Negative for tremors and numbness other than noted  Psychiatric/Behavioral: Negative for decreased concentration or agitation other than above         Objective:   Physical Exam BP 110/72  Pulse 102  Temp(Src) 98.1 F (36.7 C) (Oral)  Wt 162 lb 12 oz (73.823 kg)  SpO2 99% VS noted,  Constitutional: Pt appears well-developed, well-nourished.  HENT: Head: NCAT.  Right Ear: External ear normal.  Left Ear: External ear normal.  Eyes: . Pupils are equal, round, and reactive to light. Conjunctivae and EOM are normal Neck: Normal range of motion. Neck supple.  Cardiovascular: Normal rate and regular rhythm.   Pulmonary/Chest: Effort normal and breath sounds normal.  Abd:  Soft, NT, ND, + BS Tender bilat lower paracervical spine Neurological: Pt is alert. Not confused , motor grossly intact Skin: Skin is warm. No rash Psychiatric: Pt behavior is normal. No agitation.      Assessment & Plan:

## 2014-08-04 LAB — URINE CULTURE
Colony Count: NO GROWTH
Organism ID, Bacteria: NO GROWTH

## 2014-08-04 NOTE — Telephone Encounter (Signed)
Patient informed. 

## 2014-08-30 ENCOUNTER — Telehealth: Payer: Self-pay | Admitting: Internal Medicine

## 2014-08-30 MED ORDER — TAMSULOSIN HCL 0.4 MG PO CAPS: 0.4000 mg | ORAL_CAPSULE | Freq: Every day | ORAL | Status: DC

## 2014-08-30 NOTE — Telephone Encounter (Signed)
Prostate medication patient stated he is out of.  And once out of this medication he starts urinating more frequently at night and during the day.

## 2014-08-30 NOTE — Telephone Encounter (Signed)
Called left message to call back 

## 2014-08-30 NOTE — Telephone Encounter (Signed)
Pt called in asked for robin to call him.  Would not tell me what he needed.  He said his cell number is the best number

## 2014-08-30 NOTE — Telephone Encounter (Signed)
I think he is referring to a recent antibx which can help prostatitis; this is not normally an ongoing refillable medication  OK for flomax .4 qd which can help these type of prostate symtpoms, tends to work very quickly same day, and to let us know if not improved

## 2014-08-31 NOTE — Telephone Encounter (Signed)
Patient informed. 

## 2014-09-02 ENCOUNTER — Telehealth: Payer: Self-pay | Admitting: *Deleted

## 2014-09-02 NOTE — Telephone Encounter (Signed)
Pt return call back inform him that the medication md sent to " Flomax" was the correct medicine. This will help him with the urinary frequency....lmb

## 2014-09-02 NOTE — Telephone Encounter (Signed)
Left msg on triage stating needing to speak with robin about his medication that was sent in. He think its not the right med. Called pt was told by his mother he wasn't there left msg for him to RTC...Mark Dunlap

## 2014-10-26 ENCOUNTER — Other Ambulatory Visit: Payer: Self-pay | Admitting: Internal Medicine

## 2014-11-30 ENCOUNTER — Telehealth: Payer: Self-pay | Admitting: Internal Medicine

## 2014-11-30 NOTE — Telephone Encounter (Signed)
Pt called in wanting a refill on his  Adapalene and Benzoyl  This is his meds for acene.

## 2014-11-30 NOTE — Telephone Encounter (Signed)
Very sorry, but these are not on his med list, and are not usually prescribed by internal medicine  Perhaps he should contact his dermatologist?

## 2014-12-12 ENCOUNTER — Other Ambulatory Visit: Payer: Self-pay | Admitting: Internal Medicine

## 2014-12-31 ENCOUNTER — Other Ambulatory Visit: Payer: Self-pay | Admitting: Internal Medicine

## 2015-01-14 ENCOUNTER — Other Ambulatory Visit: Payer: Self-pay | Admitting: Internal Medicine

## 2015-01-16 DIAGNOSIS — M545 Low back pain, unspecified: Secondary | ICD-10-CM | POA: Insufficient documentation

## 2015-01-16 DIAGNOSIS — G8929 Other chronic pain: Secondary | ICD-10-CM | POA: Insufficient documentation

## 2015-01-17 NOTE — Telephone Encounter (Signed)
Done hardcopy to Cherina  

## 2015-01-17 NOTE — Telephone Encounter (Signed)
Rx done. 

## 2015-01-31 ENCOUNTER — Other Ambulatory Visit: Payer: Self-pay | Admitting: Internal Medicine

## 2015-02-02 ENCOUNTER — Encounter: Payer: Self-pay | Admitting: Internal Medicine

## 2015-02-02 ENCOUNTER — Ambulatory Visit (INDEPENDENT_AMBULATORY_CARE_PROVIDER_SITE_OTHER): Payer: Medicare Other | Admitting: Internal Medicine

## 2015-02-02 ENCOUNTER — Encounter (INDEPENDENT_AMBULATORY_CARE_PROVIDER_SITE_OTHER): Payer: Self-pay

## 2015-02-02 VITALS — BP 130/88 | HR 74 | Temp 98.0°F | Resp 18 | Ht 73.0 in | Wt 170.0 lb

## 2015-02-02 DIAGNOSIS — I10 Essential (primary) hypertension: Secondary | ICD-10-CM | POA: Diagnosis not present

## 2015-02-02 DIAGNOSIS — G8929 Other chronic pain: Secondary | ICD-10-CM | POA: Diagnosis not present

## 2015-02-02 DIAGNOSIS — N289 Disorder of kidney and ureter, unspecified: Secondary | ICD-10-CM | POA: Diagnosis not present

## 2015-02-02 DIAGNOSIS — Z0189 Encounter for other specified special examinations: Secondary | ICD-10-CM | POA: Diagnosis not present

## 2015-02-02 DIAGNOSIS — Z0000 Encounter for general adult medical examination without abnormal findings: Secondary | ICD-10-CM

## 2015-02-02 DIAGNOSIS — E785 Hyperlipidemia, unspecified: Secondary | ICD-10-CM

## 2015-02-02 MED ORDER — ZOLPIDEM TARTRATE 10 MG PO TABS: 10.0000 mg | ORAL_TABLET | Freq: Every evening | ORAL | Status: DC | PRN

## 2015-02-02 NOTE — Progress Notes (Signed)
Subjective:    Patient ID: Mark Dunlap, male    DOB: 03/19/1961, 54 y.o.   MRN: [id number]  HPI  Here to f/u; overall doing ok,  Pt denies chest pain, increasing sob or doe, wheezing, orthopnea, PND, increased LE swelling, palpitations, dizziness or syncope.  Pt denies new neurological symptoms such as new headache, or facial or extremity weakness or numbness.  Pt denies polydipsia, polyuria, or low sugar episode.   Pt denies new neurological symptoms such as new headache, or facial or extremity weakness or numbness.   Pt states overall good compliance with meds, mostly trying to follow appropriate diet, with wt overall stable,  but little exercise however  Had amoxil prior to dental tx yesterday with one glass water, then urinated 5 times.  Bothered by that spomewhat.  Saw provider recently 1 mo ago- told he has spine degenerative changes to account for back pain, f/u planned august, with Dr Jones/NS. S/p spinal fusion oct 2015 and one prior the same yr. Plans to f/u with [facility] regarding pain and hydrocodone, declines pain management referral.   Past Medical History  Diagnosis Date  . THYROID NODULE, RIGHT 03/02/2010  . Adjustment disorder with depressed mood 09/2007  . 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 HYPOTENSION 10/31/2009  . Acute prostatitis 05/23/2009  . SPINAL STENOSIS, CERVICAL 08/08/2009  . H/O: substance abuse     hx of ETOH/Crack cocaine-none since 11/08 per pt/Does not Drive due to this  . DJD (degenerative joint disease) of knee     left knee  . DJD (degenerative joint disease) of hip   . HYPERTENSION 08/30/2008  . CERVICAL RADICULOPATHY, LEFT 08/30/2008  . Other and unspecified hyperlipidemia 08/20/2013  . HYPERTHYROIDISM 02/13/2010    pt was told by  Dr Jenny Reichmann  that thyroid was now back to normal .. 2012 ...   . Seasonal allergies   . Legally blind in left eye, as defined in [place]     Blind Left eye, small amt vision Right  eye   Past Surgical History  Procedure Laterality Date  . Right hand i&d  12/07    s/p rdue to abscess-Dr. Amedeo Plenty  . Anterior cervical decomp/discectomy fusion  10/11/2011    Procedure: ANTERIOR CERVICAL DECOMPRESSION/DISCECTOMY FUSION 3 LEVELS;  Surgeon: Eustace Moore;  Location: [place] [facility];  Service: Neurosurgery;  Laterality: N/A;  Cervical three-four ,cervical four five cervical five six Anterior Cervical Decompression Fusion with peek + plate Nuvasive translational plate Orthofix peek (2 1/2 hours) Rm # 32  . Total hip arthroplasty  12/17/2011    Procedure: TOTAL HIP ARTHROPLASTY;  Surgeon: Johnny Bridge, MD;  Location: [facility];  Service: Orthopedics;  Laterality: Left;  . Posterior cervical fusion/foraminotomy N/A 09/23/2013    Procedure: CERVICALTWO TO CERVICAL SEVEN POSTERIOR CERVICAL FUSION/FORAMINOTOMY WITH LATERAL MASS FIXATION;  Surgeon: Eustace Moore, MD;  Location: [place] [facility];  Service: Neurosurgery;  Laterality: N/A;  . Anterior cervical decomp/discectomy fusion N/A 07/29/2014    Procedure: ANTERIOR CERVICAL DECOMPRESSION/DISCECTOMY FUSION 1 LEVEL/HARDWARE REMOVAL;  Surgeon: Eustace Moore, MD;  Location: [facility];  Service: Neurosurgery;  Laterality: N/A;  cervical six-seven    reports that he has been smoking Cigarettes.  He has a 40 pack-year smoking history. He does not have any smokeless tobacco history on file. He reports that he uses illicit drugs ("Crack" cocaine). He reports that he does not drink alcohol. family history includes  Alcohol abuse in his brother, cousin, and father; Arthritis in his mother; Diabetes in his brother and sister; Goiter in his mother and sister; Heart disease in his father and other; Hyperlipidemia in his mother; Hypertension in his sister; Stroke in his father. Allergies  Allergen Reactions  . Hydrocodone Itching and Other (See Comments)    10 -325 makes him itch but 10-500 doesn't  . Tramadol Itching  . Morphine And Related Itching     Review of Systems  Constitutional: Negative for unusual diaphoresis or night sweats HENT: Negative for ringing in ear or discharge Eyes: Negative for double vision or worsening visual disturbance.  Respiratory: Negative for choking and stridor.   Gastrointestinal: Negative for vomiting or other signifcant bowel change Genitourinary: Negative for hematuria or change in urine volume.  Musculoskeletal: Negative for other MSK pain or swelling Skin: Negative for color change and worsening wound.  Neurological: Negative for tremors and numbness other than noted  Psychiatric/Behavioral: Negative for decreased concentration or agitation other than above       Objective:   Physical Exam BP 130/88 mmHg  Pulse 74  Temp(Src) 98 F (36.7 C) (Oral)  Resp 18  Ht 6\' 1"  (1.854 m)  Wt 170 lb (77.111 kg)  BMI 22.43 kg/m2  SpO2 98% VS noted,  Constitutional: Pt appears in no significant distress HENT: Head: NCAT.  Right Ear: External ear normal.  Left Ear: External ear normal.  Eyes: . Pupils are equal, round, and reactive to light. Conjunctivae and EOM are normal Neck: Normal range of motion. Neck supple.  Cardiovascular: Normal rate and regular rhythm.   Pulmonary/Chest: Effort normal and breath sounds without rales or wheezing.  Abd:  Soft, NT, ND, + BS Neurological: Pt is alert. Not confused , motor grossly intact Skin: Skin is warm. No rash, no LE edema Psychiatric: Pt behavior is normal. No agitation.      Assessment & Plan:

## 2015-02-02 NOTE — Patient Instructions (Signed)
Please continue all other medications as before, and refills have been done if requested.  Please have the pharmacy call with any other refills you may need.  Please continue your efforts at being more active, low cholesterol diet, and weight control.  You are otherwise up to date with prevention measures today.  Please keep your appointments with your specialists as you may have planned  Please return in 6 months, or sooner if needed, with Lab testing done 3-5 days before  

## 2015-02-11 NOTE — Assessment & Plan Note (Signed)
stable overall by history and exam, recent data reviewed with pt, and pt to continue medical treatment as before,  to f/u any worsening symptoms or concerns Lab Results  Component Value Date   LDLCALC 102* 03/29/2014   Declines statin,  to f/u any worsening symptoms or concerns

## 2015-02-11 NOTE — Assessment & Plan Note (Signed)
stable overall by history and exam, recent data reviewed with pt, and pt to continue medical treatment as before,  to f/u any worsening symptoms or concerns BP Readings from Last 3 Encounters:  02/02/15 130/88  08/03/14 110/72  07/30/14 127/83

## 2015-02-11 NOTE — Assessment & Plan Note (Signed)
Mild to mod, for antibx course,  to f/u any worsening symptoms or concerns Lab Results  Component Value Date   WBC 7.6 07/22/2014   HGB 13.6 07/22/2014   HCT 41.2 07/22/2014   PLT 340 07/22/2014   GLUCOSE 91 07/22/2014   CHOL 209* 03/29/2014   TRIG 234.0* 03/29/2014   HDL 60.30 03/29/2014   LDLDIRECT 120.4 03/11/2013   LDLCALC 102* 03/29/2014   ALT 20 07/22/2014   AST 28 07/22/2014   NA 140 07/22/2014   K 4.2 07/22/2014   CL 102 07/22/2014   CREATININE 0.95 07/22/2014   BUN 9 07/22/2014   CO2 26 07/22/2014   TSH 5.32* 03/29/2014   PSA 2.52 07/13/2014   INR 0.96 07/22/2014

## 2015-02-11 NOTE — Assessment & Plan Note (Signed)
Mild to mod, for cont'd pain control, declines pain management,  to f/u any worsening symptoms or concerns

## 2015-02-17 ENCOUNTER — Encounter: Payer: Self-pay | Admitting: Gastroenterology

## 2015-02-22 ENCOUNTER — Encounter: Payer: Self-pay | Admitting: Gastroenterology

## 2015-03-23 ENCOUNTER — Encounter: Payer: Self-pay | Admitting: Gastroenterology

## 2015-04-18 ENCOUNTER — Ambulatory Visit (AMBULATORY_SURGERY_CENTER): Payer: Self-pay

## 2015-04-18 ENCOUNTER — Encounter: Payer: Self-pay | Admitting: Internal Medicine

## 2015-04-18 ENCOUNTER — Ambulatory Visit (INDEPENDENT_AMBULATORY_CARE_PROVIDER_SITE_OTHER): Payer: Medicare Other | Admitting: Internal Medicine

## 2015-04-18 ENCOUNTER — Other Ambulatory Visit (INDEPENDENT_AMBULATORY_CARE_PROVIDER_SITE_OTHER): Payer: Medicare Other

## 2015-04-18 VITALS — Ht 73.5 in | Wt 168.4 lb

## 2015-04-18 VITALS — BP 108/74 | HR 80 | Temp 97.9°F | Ht 73.0 in | Wt 168.0 lb

## 2015-04-18 DIAGNOSIS — R609 Edema, unspecified: Secondary | ICD-10-CM

## 2015-04-18 DIAGNOSIS — Z0000 Encounter for general adult medical examination without abnormal findings: Secondary | ICD-10-CM

## 2015-04-18 DIAGNOSIS — Z860100 Personal history of colon polyps, unspecified: Secondary | ICD-10-CM

## 2015-04-18 DIAGNOSIS — I10 Essential (primary) hypertension: Secondary | ICD-10-CM

## 2015-04-18 DIAGNOSIS — E785 Hyperlipidemia, unspecified: Secondary | ICD-10-CM

## 2015-04-18 DIAGNOSIS — Z0189 Encounter for other specified special examinations: Secondary | ICD-10-CM | POA: Diagnosis not present

## 2015-04-18 DIAGNOSIS — R7989 Other specified abnormal findings of blood chemistry: Secondary | ICD-10-CM | POA: Diagnosis not present

## 2015-04-18 LAB — CBC WITH DIFFERENTIAL/PLATELET
Basophils Absolute: 0 10*3/uL (ref 0.0–0.1)
Basophils Relative: 0.5 % (ref 0.0–3.0)
Eosinophils Absolute: 0.6 10*3/uL (ref 0.0–0.7)
Eosinophils Relative: 6.7 % — ABNORMAL HIGH (ref 0.0–5.0)
HEMATOCRIT: 43.8 % (ref 39.0–52.0)
Hemoglobin: 14.4 g/dL (ref 13.0–17.0)
LYMPHS ABS: 1.7 10*3/uL (ref 0.7–4.0)
Lymphocytes Relative: 19 % (ref 12.0–46.0)
MCHC: 33 g/dL (ref 30.0–36.0)
MCV: 93.5 fl (ref 78.0–100.0)
MONO ABS: 0.9 10*3/uL (ref 0.1–1.0)
MONOS PCT: 9.7 % (ref 3.0–12.0)
NEUTROS PCT: 64.1 % (ref 43.0–77.0)
Neutro Abs: 5.6 10*3/uL (ref 1.4–7.7)
PLATELETS: 370 10*3/uL (ref 150.0–400.0)
RBC: 4.69 Mil/uL (ref 4.22–5.81)
RDW: 13.4 % (ref 11.5–15.5)
WBC: 8.8 10*3/uL (ref 4.0–10.5)

## 2015-04-18 LAB — HEPATIC FUNCTION PANEL
ALBUMIN: 4 g/dL (ref 3.5–5.2)
ALT: 11 U/L (ref 0–53)
AST: 14 U/L (ref 0–37)
Alkaline Phosphatase: 121 U/L — ABNORMAL HIGH (ref 39–117)
Bilirubin, Direct: 0.1 mg/dL (ref 0.0–0.3)
TOTAL PROTEIN: 7.5 g/dL (ref 6.0–8.3)
Total Bilirubin: 0.3 mg/dL (ref 0.2–1.2)

## 2015-04-18 LAB — BASIC METABOLIC PANEL
BUN: 9 mg/dL (ref 6–23)
CO2: 28 mEq/L (ref 19–32)
Calcium: 10 mg/dL (ref 8.4–10.5)
Chloride: 104 mEq/L (ref 96–112)
Creatinine, Ser: 0.95 mg/dL (ref 0.40–1.50)
GFR: 105.78 mL/min (ref >60.00)
GLUCOSE: 101 mg/dL — AB (ref 70–99)
POTASSIUM: 4.1 meq/L (ref 3.5–5.1)
Sodium: 140 mEq/L (ref 135–145)

## 2015-04-18 LAB — LDL CHOLESTEROL, DIRECT: Direct LDL: 80 mg/dL

## 2015-04-18 LAB — LIPID PANEL
Cholesterol: 158 mg/dL (ref 0–200)
HDL: 40.7 mg/dL (ref >39.00)
NonHDL: 117.3
TRIGLYCERIDES: 207 mg/dL — AB (ref 0.0–149.0)
Total CHOL/HDL Ratio: 4
VLDL: 41.4 mg/dL — ABNORMAL HIGH (ref 0.0–40.0)

## 2015-04-18 LAB — TSH: TSH: 1.39 u[IU]/mL (ref 0.35–4.50)

## 2015-04-18 LAB — PSA: PSA: 3.39 ng/mL (ref 0.10–4.00)

## 2015-04-18 MED ORDER — SUPREP BOWEL PREP KIT 17.5-3.13-1.6 GM/177ML PO SOLN: 1.0000 | Freq: Once | ORAL | Status: DC

## 2015-04-18 MED ORDER — HYDROCHLOROTHIAZIDE 12.5 MG PO CAPS: 12.5000 mg | ORAL_CAPSULE | Freq: Every day | ORAL | Status: DC | PRN

## 2015-04-18 NOTE — Progress Notes (Signed)
Pre visit review using our clinic review tool, if applicable. No additional management support is needed unless otherwise documented below in the visit note. 

## 2015-04-18 NOTE — Assessment & Plan Note (Signed)
Suspect increased po fluids, more sitting and character seems c/w venous insufficiency type edema, but also for labs today, declines other eval for now such as echo.

## 2015-04-18 NOTE — Progress Notes (Signed)
Subjective:    Patient ID: Mark Dunlap, male    DOB: 09/15/1961, 54 y.o.   MRN: [id number]  HPI  Here for wellness and f/u;  Overall doing ok;  Pt denies Chest pain, worsening SOB, DOE, wheezing, orthopnea, PND, worsening LE edema, except for trace bilat leg edema in the last 2 wks that is resolved with leg elevation overnight, then recurs by next evening.  Now sitting for work instead of standing in the last 2 wks as well.  Meds also make mouth dry and admits to increased po fluids as well. Denies palpitations, dizziness or syncope.  Pt denies neurological change such as new headache, facial or extremity weakness.  Pt denies polydipsia, polyuria, or low sugar symptoms. Pt states overall good compliance with treatment and medications, good tolerability, and has been trying to follow appropriate diet.  Pt denies worsening depressive symptoms, suicidal ideation or panic. No fever, night sweats, wt loss, loss of appetite, or other constitutional symptoms.  Pt states good ability with ADL's, has low fall risk, home safety reviewed and adequate, no other significant changes in hearing or vision, and only occasionally active with exercise. Past Medical History  Diagnosis Date  . THYROID NODULE, RIGHT 03/02/2010  . Adjustment disorder with depressed mood 09/2007  . 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 HYPOTENSION 10/31/2009  . Acute prostatitis 05/23/2009  . SPINAL STENOSIS, CERVICAL 08/08/2009  . H/O: substance abuse     hx of ETOH/Crack cocaine-none since 11/08 per pt/Does not Drive due to this  . DJD (degenerative joint disease) of knee     left knee  . DJD (degenerative joint disease) of hip   . HYPERTENSION 08/30/2008  . CERVICAL RADICULOPATHY, LEFT 08/30/2008  . Other and unspecified hyperlipidemia 08/20/2013  . HYPERTHYROIDISM 02/13/2010    pt was told by  Dr Jenny Reichmann  that thyroid was now back to normal .. 2012 ...   . Seasonal allergies   .  Legally blind in left eye, as defined in [place]     Blind Left eye, small amt vision Right eye   Past Surgical History  Procedure Laterality Date  . Right hand i&d  12/07    s/p rdue to abscess-Dr. Amedeo Plenty  . Anterior cervical decomp/discectomy fusion  10/11/2011    Procedure: ANTERIOR CERVICAL DECOMPRESSION/DISCECTOMY FUSION 3 LEVELS;  Surgeon: Eustace Moore;  Location: [facility];  Service: Neurosurgery;  Laterality: N/A;  Cervical three-four ,cervical four five cervical five six Anterior Cervical Decompression Fusion with peek + plate Nuvasive translational plate Orthofix peek (2 1/2 hours) Rm # 32  . Total hip arthroplasty  12/17/2011    Procedure: TOTAL HIP ARTHROPLASTY;  Surgeon: Johnny Bridge, MD;  Location: [facility];  Service: Orthopedics;  Laterality: Left;  . Posterior cervical fusion/foraminotomy N/A 09/23/2013    Procedure: CERVICALTWO TO CERVICAL SEVEN POSTERIOR CERVICAL FUSION/FORAMINOTOMY WITH LATERAL MASS FIXATION;  Surgeon: Eustace Moore, MD;  Location: [facility];  Service: Neurosurgery;  Laterality: N/A;  . Anterior cervical decomp/discectomy fusion N/A 07/29/2014    Procedure: ANTERIOR CERVICAL DECOMPRESSION/DISCECTOMY FUSION 1 LEVEL/HARDWARE REMOVAL;  Surgeon: Eustace Moore, MD;  Location: [facility];  Service: Neurosurgery;  Laterality: N/A;  cervical six-seven    reports that he has been smoking Cigarettes.  He has a 40 pack-year smoking history. He has never used smokeless tobacco. He reports that he uses illicit drugs ("Crack" cocaine). He reports that he does  not drink alcohol. family history includes Alcohol abuse in his brother, cousin, and father; Arthritis in his mother; Diabetes in his brother and sister; Goiter in his mother and sister; Heart disease in his father and other; Hyperlipidemia in his mother; Hypertension in his sister; Stroke in his father. There is no history of Colon cancer. Allergies  Allergen Reactions  . Hydrocodone Itching and Other (See  Comments)    10 -325 makes him itch but 10-500 doesn't  . Tramadol Itching  . Morphine And Related Itching   Current Outpatient Prescriptions on File Prior to Visit  Medication Sig Dispense Refill  . adapalene (DIFFERIN) 0.1 % gel Apply 1 application topically at bedtime. 45 g 0  . amLODipine (NORVASC) 5 MG tablet Take 1 tablet (5 mg total) by mouth daily. 90 tablet 3  . aspirin 81 MG tablet Take 1 tablet (81 mg total) by mouth daily. 30 tablet   . Benzoyl Peroxide 5.5 % CREA Apply 1 application topically daily.    . bimatoprost (LUMIGAN) 0.01 % SOLN Place 1 drop into both eyes at bedtime.    . carboxymethylcellulose (REFRESH PLUS) 0.5 % SOLN Place 1 drop into both eyes 3 (three) times daily as needed (keep eyes clear).     . diphenhydrAMINE (BENADRYL) 25 mg capsule Take 75 mg by mouth every 6 (six) hours as needed for itching.    . fluticasone (FLONASE) 50 MCG/ACT nasal spray Place 2 sprays into both nostrils daily.    [name] HYDROcodone-acetaminophen (NORCO) 10-325 MG per tablet Take 1 tablet by mouth every 6 (six) hours as needed for moderate pain. 60 tablet 0  . Multiple Vitamins-Minerals (MULTIVITAMIN WITH MINERALS) tablet Take 2 tablets by mouth daily. gummies not tablets    . ondansetron (ZOFRAN) 4 MG tablet Take 4 mg by mouth every 8 (eight) hours as needed for nausea or vomiting.    . tamsulosin (FLOMAX) 0.4 MG CAPS capsule Take 1 capsule (0.4 mg total) by mouth daily. 30 capsule 11  . tiZANidine (ZANAFLEX) 4 MG capsule TAKE 1 CAPSULE BY ORAL ROUTE 3 TIMES EVERY DAY 60 capsule 0  . zolpidem (AMBIEN) 10 MG tablet Take 1 tablet (10 mg total) by mouth at bedtime as needed. for sleep 30 tablet 5  . OVER THE COUNTER MEDICATION Mucinex 2 daily    . SUPREP BOWEL PREP SOLN Take 1 kit by mouth once. (Patient not taking: Reported on 04/18/2015) 354 mL 0   No current facility-administered medications on file prior to visit.    Review of Systems Constitutional: Negative for increased diaphoresis,  other activity, appetite or siginficant weight change other than noted HENT: Negative for worsening hearing loss, ear pain, facial swelling, mouth sores and neck stiffness.   Eyes: Negative for other worsening pain, redness or visual disturbance.  Respiratory: Negative for shortness of breath and wheezing  Cardiovascular: Negative for chest pain and palpitations.  Gastrointestinal: Negative for diarrhea, blood in stool, abdominal distention or other pain Genitourinary: Negative for hematuria, flank pain or change in urine volume.  Musculoskeletal: Negative for myalgias or other joint complaints.  Skin: Negative for color change and wound or drainage.  Neurological: Negative for syncope and numbness. other than noted Hematological: Negative for adenopathy. or other swelling Psychiatric/Behavioral: Negative for hallucinations, SI, self-injury, decreased concentration or other worsening agitation.      Objective:   Physical Exam BP 108/74 mmHg  Pulse 80  Temp(Src) 97.9 F (36.6 C) (Oral)  Ht _0  (1.854 m)  Wt 168 lb (  76.204 kg)  BMI 22.17 kg/m2  SpO2 97% VS noted,  Constitutional: Pt is oriented to person, place, and time. Appears well-developed and well-nourished, in no significant distress Head: Normocephalic and atraumatic.  Right Ear: External ear normal.  Left Ear: External ear normal.  Nose: Nose normal.  Mouth/Throat: Oropharynx is clear and moist.  Eyes: Conjunctivae and EOM are normal. Pupils are equal, round, and reactive to light.  Neck: Normal range of motion. Neck supple. No JVD present. No tracheal deviation present or significant neck LA or mass Cardiovascular: Normal rate, regular rhythm, normal heart sounds and intact distal pulses.   Pulmonary/Chest: Effort normal and breath sounds without rales or wheezing  Abdominal: Soft. Bowel sounds are normal. NT. No HSM  Musculoskeletal: Normal range of motion. Exhibits trace edema to ankles bilat edema.  Lymphadenopathy:   Has no cervical adenopathy.  Neurological: Pt is alert and oriented to person, place, and time. Pt has normal reflexes. No cranial nerve deficit. Motor grossly intact Skin: Skin is warm and dry. No rash noted.  Psychiatric:  Has normal mood and affect. Behavior is normal.     Assessment & Plan:

## 2015-04-18 NOTE — Assessment & Plan Note (Signed)

## 2015-04-18 NOTE — Patient Instructions (Addendum)
Please take all new medication as prescribed - the fluid pill as needed  Please continue all other medications as before, and refills have been done if requested.  Please have the pharmacy call with any other refills you may need.  Please continue your efforts at being more active, low cholesterol diet, and weight control.  You are otherwise up to date with prevention measures today.  Please keep your appointments with your specialists as you may have planned  Please go to the LAB in the Basement (turn left off the elevator) for the tests to be done today  You will be contacted by phone if any changes need to be made immediately.  Otherwise, you will receive a letter about your results with an explanation, but please check with MyChart first.  Please remember to sign up for MyChart if you have not done so, as this will be important to you in the future with finding out test results, communicating by private email, and scheduling acute appointments online when needed.  Please return in 6 months, or sooner if needed  (OK to cancel the October 2016 appt since you were here today)

## 2015-04-18 NOTE — Progress Notes (Signed)
No allergies to eggs or soy No diet/weight loss meds No home oxygen No past problems with anesthesia  Doesn't want to register for emmi.  No computer.  Eyes bad.

## 2015-04-28 ENCOUNTER — Ambulatory Visit (AMBULATORY_SURGERY_CENTER): Payer: Medicare Other | Admitting: Gastroenterology

## 2015-04-28 ENCOUNTER — Encounter: Payer: Self-pay | Admitting: Gastroenterology

## 2015-04-28 VITALS — BP 132/92 | HR 70 | Temp 96.6°F | Resp 23 | Ht 73.0 in | Wt 168.0 lb

## 2015-04-28 DIAGNOSIS — Z86010 Personal history of colonic polyps: Secondary | ICD-10-CM | POA: Diagnosis present

## 2015-04-28 MED ORDER — SODIUM CHLORIDE 0.9 % IV SOLN: 500.0000 mL | INTRAVENOUS | Status: DC

## 2015-04-28 NOTE — Progress Notes (Signed)
Report to PACU, RN, vss, BBS= Clear.  

## 2015-04-28 NOTE — Op Note (Signed)
[facility]  [address] [place], [address]   COLONOSCOPY PROCEDURE REPORT  PATIENT: Mark, Dunlap  MR#: [id number] BIRTHDATE: 08-Jan-1961 , 46  yrs. old GENDER: male ENDOSCOPIST: Ladene Artist, MD, [facility] PROCEDURE DATE:  04/28/2015 PROCEDURE:   Colonoscopy, surveillance First Screening Colonoscopy - Avg.  risk and is 50 yrs.  old or older - No.  Prior Negative Screening - Now for repeat screening. N/A  History of Adenoma - Now for follow-up colonoscopy & has been > or = to 3 yrs.  Yes hx of adenoma.  Has been 3 or more years since last colonoscopy.  Polyps removed today? No Recommend repeat exam, <10 yrs? Yes high risk ASA CLASS:   Class III INDICATIONS:Surveillance due to prior colonic neoplasia and PH Colon Adenoma. MEDICATIONS: Monitored anesthesia care and Propofol 200 mg IV DESCRIPTION OF PROCEDURE:   After the risks benefits and alternatives of the procedure were thoroughly explained, informed consent was obtained.  The digital rectal exam revealed no abnormalities of the rectum.   The [id number]  endoscope was introduced through the anus and advanced to the cecum, which was identified by both the appendix and ileocecal valve. No adverse events experienced.   The quality of the prep was excellent. (Suprep was used)  The instrument was then slowly withdrawn as the colon was fully examined. Estimated blood loss is zero unless otherwise noted in this procedure report.    COLON FINDINGS: A normal appearing cecum, ileocecal valve, and appendiceal orifice were identified.  The ascending, transverse, descending, sigmoid colon, and rectum appeared unremarkable. Retroflexed views revealed no abnormalities. The time to cecum = 3.0 Withdrawal time = 7.1   The scope was withdrawn and the procedure completed. COMPLICATIONS: There were no immediate complications.  ENDOSCOPIC IMPRESSION: Normal colonoscopy  RECOMMENDATIONS: Repeat Colonoscopy in  5 years.  eSigned:  Ladene Artist, MD, [facility] 04/28/2015 10:53 AM

## 2015-04-28 NOTE — Patient Instructions (Signed)
YOU HAD AN ENDOSCOPIC PROCEDURE TODAY AT THE [facility]:   Refer to the procedure report that was given to you for any specific questions about what was found during the examination.  If the procedure report does not answer your questions, please call your gastroenterologist to clarify.  If you requested that your care partner not be given the details of your procedure findings, then the procedure report has been included in a sealed envelope for you to review at your convenience later.  YOU SHOULD EXPECT: Some feelings of bloating in the abdomen. Passage of more gas than usual.  Walking can help get rid of the air that was put into your GI tract during the procedure and reduce the bloating. If you had a lower endoscopy (such as a colonoscopy or flexible sigmoidoscopy) you may notice spotting of blood in your stool or on the toilet paper. If you underwent a bowel prep for your procedure, you may not have a normal bowel movement for a few days.  Please Note:  You might notice some irritation and congestion in your nose or some drainage.  This is from the oxygen used during your procedure.  There is no need for concern and it should clear up in a day or so.  SYMPTOMS TO REPORT IMMEDIATELY:   Following lower endoscopy (colonoscopy or flexible sigmoidoscopy):  Excessive amounts of blood in the stool  Significant tenderness or worsening of abdominal pains  Swelling of the abdomen that is new, acute  Fever of 100F or higher   For urgent or emergent issues, a gastroenterologist can be reached at any hour by calling [phone].   DIET: Your first meal following the procedure should be a small meal and then it is ok to progress to your normal diet. Heavy or fried foods are harder to digest and may make you feel nauseous or bloated.  Likewise, meals heavy in dairy and vegetables can increase bloating.  Drink plenty of fluids but you should avoid alcoholic beverages for 24  hours.  ACTIVITY:  You should plan to take it easy for the rest of today and you should NOT DRIVE or use heavy machinery until tomorrow (because of the sedation medicines used during the test).    FOLLOW UP: Our staff will call the number listed on your records the next business day following your procedure to check on you and address any questions or concerns that you may have regarding the information given to you following your procedure. If we do not reach you, we will leave a message.  However, if you are feeling well and you are not experiencing any problems, there is no need to return our call.  We will assume that you have returned to your regular daily activities without incident.  If any biopsies were taken you will be contacted by phone or by letter within the next 1-3 weeks.  Please call us at [phone] if you have not heard about the biopsies in 3 weeks.    SIGNATURES/CONFIDENTIALITY: You and/or your care partner have signed paperwork which will be entered into your electronic medical record.  These signatures attest to the fact that that the information above on your After Visit Summary has been reviewed and is understood.  Full responsibility of the confidentiality of this discharge information lies with you and/or your care-partner. 

## 2015-05-01 ENCOUNTER — Telehealth: Payer: Self-pay

## 2015-05-01 NOTE — Telephone Encounter (Signed)
Left a message at [phone] for the pt to call us back if any questions or concerns. maw

## 2015-08-01 ENCOUNTER — Telehealth: Payer: Self-pay

## 2015-08-01 DIAGNOSIS — Z0000 Encounter for general adult medical examination without abnormal findings: Secondary | ICD-10-CM

## 2015-08-01 NOTE — Telephone Encounter (Signed)
Pt called requesting labs prior to CPE appt

## 2015-08-02 ENCOUNTER — Other Ambulatory Visit (INDEPENDENT_AMBULATORY_CARE_PROVIDER_SITE_OTHER): Payer: Medicare Other

## 2015-08-02 DIAGNOSIS — E0590 Thyrotoxicosis, unspecified without thyrotoxic crisis or storm: Secondary | ICD-10-CM | POA: Diagnosis not present

## 2015-08-02 DIAGNOSIS — I10 Essential (primary) hypertension: Secondary | ICD-10-CM

## 2015-08-02 DIAGNOSIS — Z0000 Encounter for general adult medical examination without abnormal findings: Secondary | ICD-10-CM

## 2015-08-02 LAB — BASIC METABOLIC PANEL
BUN: 12 mg/dL (ref 6–23)
CALCIUM: 9.2 mg/dL (ref 8.4–10.5)
CHLORIDE: 96 meq/L (ref 96–112)
CO2: 30 mEq/L (ref 19–32)
CREATININE: 1.03 mg/dL (ref 0.40–1.50)
GFR: 96.25 mL/min (ref >60.00)
Glucose, Bld: 82 mg/dL (ref 70–99)
Potassium: 3.8 mEq/L (ref 3.5–5.1)
Sodium: 132 mEq/L — ABNORMAL LOW (ref 135–145)

## 2015-08-02 LAB — CBC WITH DIFFERENTIAL/PLATELET
BASOS ABS: 0.1 10*3/uL (ref 0.0–0.1)
Basophils Relative: 1.1 % (ref 0.0–3.0)
Eosinophils Absolute: 0.6 10*3/uL (ref 0.0–0.7)
Eosinophils Relative: 8.5 % — ABNORMAL HIGH (ref 0.0–5.0)
HEMATOCRIT: 44.9 % (ref 39.0–52.0)
Hemoglobin: 14.5 g/dL (ref 13.0–17.0)
LYMPHS PCT: 28.6 % (ref 12.0–46.0)
Lymphs Abs: 2.2 10*3/uL (ref 0.7–4.0)
MCHC: 32.2 g/dL (ref 30.0–36.0)
MCV: 92.1 fl (ref 78.0–100.0)
MONOS PCT: 11.4 % (ref 3.0–12.0)
Monocytes Absolute: 0.9 10*3/uL (ref 0.1–1.0)
NEUTROS ABS: 3.8 10*3/uL (ref 1.4–7.7)
Neutrophils Relative %: 50.4 % (ref 43.0–77.0)
PLATELETS: 286 10*3/uL (ref 150.0–400.0)
RBC: 4.88 Mil/uL (ref 4.22–5.81)
RDW: 14.2 % (ref 11.5–15.5)
WBC: 7.6 10*3/uL (ref 4.0–10.5)

## 2015-08-02 LAB — URINALYSIS, ROUTINE W REFLEX MICROSCOPIC
BILIRUBIN URINE: NEGATIVE
HGB URINE DIPSTICK: NEGATIVE
KETONES UR: NEGATIVE
LEUKOCYTES UA: NEGATIVE
NITRITE: NEGATIVE
PH: 5.5 (ref 5.0–8.0)
RBC / HPF: NONE SEEN (ref 0–?)
Specific Gravity, Urine: 1.01 (ref 1.000–1.030)
Total Protein, Urine: NEGATIVE
UROBILINOGEN UA: 0.2 (ref 0.0–1.0)
Urine Glucose: NEGATIVE

## 2015-08-02 LAB — LIPID PANEL
CHOL/HDL RATIO: 4
Cholesterol: 151 mg/dL (ref 0–200)
HDL: 42.6 mg/dL (ref >39.00)
LDL Cholesterol: 78 mg/dL (ref 0–99)
NonHDL: 108.57
TRIGLYCERIDES: 151 mg/dL — AB (ref 0.0–149.0)
VLDL: 30.2 mg/dL (ref 0.0–40.0)

## 2015-08-02 LAB — HEPATIC FUNCTION PANEL
ALBUMIN: 3.8 g/dL (ref 3.5–5.2)
ALK PHOS: 96 U/L (ref 39–117)
ALT: 13 U/L (ref 0–53)
AST: 16 U/L (ref 0–37)
Bilirubin, Direct: 0.1 mg/dL (ref 0.0–0.3)
TOTAL PROTEIN: 6.8 g/dL (ref 6.0–8.3)
Total Bilirubin: 0.4 mg/dL (ref 0.2–1.2)

## 2015-08-02 LAB — TSH: TSH: 1.6 u[IU]/mL (ref 0.35–4.50)

## 2015-08-02 LAB — PSA: PSA: 2.96 ng/mL (ref 0.10–4.00)

## 2015-08-08 ENCOUNTER — Encounter: Payer: Self-pay | Admitting: Internal Medicine

## 2015-08-08 ENCOUNTER — Ambulatory Visit (INDEPENDENT_AMBULATORY_CARE_PROVIDER_SITE_OTHER): Payer: Medicare Other | Admitting: Internal Medicine

## 2015-08-08 VITALS — BP 138/90 | HR 70 | Temp 98.8°F | Ht 73.0 in | Wt 172.8 lb

## 2015-08-08 DIAGNOSIS — Z23 Encounter for immunization: Secondary | ICD-10-CM | POA: Diagnosis not present

## 2015-08-08 DIAGNOSIS — Z0000 Encounter for general adult medical examination without abnormal findings: Secondary | ICD-10-CM

## 2015-08-08 DIAGNOSIS — R5383 Other fatigue: Secondary | ICD-10-CM | POA: Diagnosis not present

## 2015-08-08 MED ORDER — FLUTICASONE PROPIONATE 50 MCG/ACT NA SUSP: 2.0000 | Freq: Every day | NASAL | Status: DC

## 2015-08-08 MED ORDER — ZOLPIDEM TARTRATE 10 MG PO TABS: 10.0000 mg | ORAL_TABLET | Freq: Every evening | ORAL | Status: DC | PRN

## 2015-08-08 MED ORDER — ONDANSETRON HCL 4 MG PO TABS: 4.0000 mg | ORAL_TABLET | Freq: Three times a day (TID) | ORAL | Status: DC | PRN

## 2015-08-08 NOTE — Assessment & Plan Note (Signed)
Mild, ok for testostserone check

## 2015-08-08 NOTE — Progress Notes (Signed)
Pre visit review using our clinic review tool, if applicable. No additional management support is needed unless otherwise documented below in the visit note. 

## 2015-08-08 NOTE — Progress Notes (Signed)
Subjective:    Patient ID: Mark Dunlap, male    DOB: [birth year], 54 y.o.   MRN: [id number]  HPI  Here for wellness and f/u;  Overall doing ok;  Pt denies Chest pain, worsening SOB, DOE, wheezing, orthopnea, PND, worsening LE edema, palpitations, dizziness or syncope.  Pt denies neurological change such as new headache, facial or extremity weakness.  Pt denies polydipsia, polyuria, or low sugar symptoms. Pt states overall good compliance with treatment and medications, good tolerability, and has been trying to follow appropriate diet.  Pt denies worsening depressive symptoms, suicidal ideation or panic. No fever, night sweats, wt loss, loss of appetite, or other constitutional symptoms.  Pt states good ability with ADL's, has low fall risk, home safety reviewed and adequate, no other significant changes in hearing or vision, and only occasionally active with exercise. Mother almost 1, much stress taking care of her.  Has lower stamina and fatigue recently, asks for testosterone check.  Also still with insomnia most nights getting to sleep. Past Medical History  Diagnosis Date  . THYROID NODULE, RIGHT 03/02/2010  . Adjustment disorder with depressed mood 09/2007  . 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 HYPOTENSION 10/31/2009  . Acute prostatitis 05/23/2009  . SPINAL STENOSIS, CERVICAL 08/08/2009  . H/O: substance abuse     hx of ETOH/Crack cocaine-none since 11/08 per pt/Does not Drive due to this  . DJD (degenerative joint disease) of knee     left knee  . DJD (degenerative joint disease) of hip   . HYPERTENSION 08/30/2008  . CERVICAL RADICULOPATHY, LEFT 08/30/2008  . Other and unspecified hyperlipidemia 08/20/2013  . HYPERTHYROIDISM 02/13/2010    pt was told by  Dr Jenny Reichmann  that thyroid was now back to normal .. 2012 ...   . Seasonal allergies   . Legally blind in left eye, as defined in [place]     Blind Left eye, small amt vision Right eye    Past Surgical History  Procedure Laterality Date  . Right hand i&d  12/07    s/p rdue to abscess-Dr. Amedeo Plenty  . Anterior cervical decomp/discectomy fusion  10/11/2011    Procedure: ANTERIOR CERVICAL DECOMPRESSION/DISCECTOMY FUSION 3 LEVELS;  Surgeon: Eustace Moore;  Location: [place] [facility];  Service: Neurosurgery;  Laterality: N/A;  Cervical three-four ,cervical four five cervical five six Anterior Cervical Decompression Fusion with peek + plate Nuvasive translational plate Orthofix peek (2 1/2 hours) Rm # 32  . Total hip arthroplasty  12/17/2011    Procedure: TOTAL HIP ARTHROPLASTY;  Surgeon: Johnny Bridge, MD;  Location: [facility];  Service: Orthopedics;  Laterality: Left;  . Posterior cervical fusion/foraminotomy N/A 09/23/2013    Procedure: CERVICALTWO TO CERVICAL SEVEN POSTERIOR CERVICAL FUSION/FORAMINOTOMY WITH LATERAL MASS FIXATION;  Surgeon: Eustace Moore, MD;  Location: [facility];  Service: Neurosurgery;  Laterality: N/A;  . Anterior cervical decomp/discectomy fusion N/A 07/29/2014    Procedure: ANTERIOR CERVICAL DECOMPRESSION/DISCECTOMY FUSION 1 LEVEL/HARDWARE REMOVAL;  Surgeon: Eustace Moore, MD;  Location: [place] [facility] [facility];  Service: Neurosurgery;  Laterality: N/A;  cervical six-seven    reports that he has been smoking Cigarettes.  He has a 40 pack-year smoking history. He has never used smokeless tobacco. He reports that he uses illicit drugs ("Crack" cocaine). He reports that he does not drink alcohol. family history includes Alcohol abuse in his brother, cousin, and father; Arthritis in his mother; Diabetes in his brother and  sister; Goiter in his mother and sister; Heart disease in his father and other; Hyperlipidemia in his mother; Hypertension in his sister; Stroke in his father. There is no history of Colon cancer. Allergies  Allergen Reactions  . Hydrocodone Itching and Other (See Comments)    10 -325 makes him itch but 10-500 doesn't  . Tramadol Itching  . Morphine And  Related Itching   Current Outpatient Prescriptions on File Prior to Visit  Medication Sig Dispense Refill  . adapalene (DIFFERIN) 0.1 % gel Apply 1 application topically at bedtime. 45 g 0  . aspirin 81 MG tablet Take 1 tablet (81 mg total) by mouth daily. 30 tablet   . Benzoyl Peroxide 5.5 % CREA Apply 1 application topically daily.    . bimatoprost (LUMIGAN) 0.01 % SOLN Place 1 drop into both eyes at bedtime.    . carboxymethylcellulose (REFRESH PLUS) 0.5 % SOLN Place 1 drop into both eyes 3 (three) times daily as needed (keep eyes clear).     . diphenhydrAMINE (BENADRYL) 25 mg capsule Take 75 mg by mouth every 6 (six) hours as needed for itching.    . hydrochlorothiazide (MICROZIDE) 12.5 MG capsule Take 1 capsule (12.5 mg total) by mouth daily as needed. 30 capsule 11  . HYDROcodone-acetaminophen (NORCO) 10-325 MG per tablet Take 1 tablet by mouth every 6 (six) hours as needed for moderate pain. 60 tablet 0  . Multiple Vitamins-Minerals (MULTIVITAMIN WITH MINERALS) tablet Take 2 tablets by mouth daily. gummies not tablets    . OVER THE COUNTER MEDICATION Mucinex 2 daily    . tamsulosin (FLOMAX) 0.4 MG CAPS capsule Take 1 capsule (0.4 mg total) by mouth daily. 30 capsule 11  . tiZANidine (ZANAFLEX) 4 MG capsule TAKE 1 CAPSULE BY ORAL ROUTE 3 TIMES EVERY DAY 60 capsule 0   No current facility-administered medications on file prior to visit.    Review of Systems Constitutional: Negative for increased diaphoresis, other activity, appetite or siginficant weight change other than noted HENT: Negative for worsening hearing loss, ear pain, facial swelling, mouth sores and neck stiffness.   Eyes: Negative for other worsening pain, redness or visual disturbance.  Respiratory: Negative for shortness of breath and wheezing  Cardiovascular: Negative for chest pain and palpitations.  Gastrointestinal: Negative for diarrhea, blood in stool, abdominal distention or other pain Genitourinary: Negative  for hematuria, flank pain or change in urine volume.  Musculoskeletal: Negative for myalgias or other joint complaints.  Skin: Negative for color change and wound or drainage.  Neurological: Negative for syncope and numbness. other than noted Hematological: Negative for adenopathy. or other swelling Psychiatric/Behavioral: Negative for hallucinations, SI, self-injury, decreased concentration or other worsening agitation.      Objective:   Physical Exam BP 138/90 mmHg  Pulse 70  Temp(Src) 98.8 F (37.1 C) (Oral)  Ht 6\' 1"  (1.854 m)  Wt 172 lb 12 oz (78.359 kg)  BMI 22.80 kg/m2  SpO2 97% VS noted,  Constitutional: Pt is oriented to person, place, and time. Appears well-developed and well-nourished, in no significant distress Head: Normocephalic and atraumatic.  Right Ear: External ear normal.  Left Ear: External ear normal.  Nose: Nose normal.  Mouth/Throat: Oropharynx is clear and moist.  Eyes: Conjunctivae and EOM are normal. Pupils are equal, round, and reactive to light.  Neck: Normal range of motion. Neck supple. No JVD present. No tracheal deviation present or significant neck LA or mass Cardiovascular: Normal rate, regular rhythm, normal heart sounds and intact distal pulses.  Pulmonary/Chest: Effort normal and breath sounds without rales or wheezing  Abdominal: Soft. Bowel sounds are normal. NT. No HSM  Musculoskeletal: Normal range of motion. Exhibits no edema.  Lymphadenopathy:  Has no cervical adenopathy.  Neurological: Pt is alert and oriented to person, place, and time. Pt has normal reflexes. No cranial nerve deficit. Motor grossly intact except for bliind left eye, very low vision on right  Skin: Skin is warm and dry. No rash noted.  Psychiatric:  Has normal mood and affect. Behavior is normal.     Assessment & Plan:

## 2015-08-08 NOTE — Addendum Note (Signed)
Addended by: Terence Lux B on: 08/08/2015 05:47 PM   Modules accepted: Orders

## 2015-08-08 NOTE — Patient Instructions (Addendum)
You had the flu shot today, and the Tdap (tetanus shot) today  Please continue all other medications as before, and refills have been done if requested.  Please have the pharmacy call with any other refills you may need.  Please continue your efforts at being more active, low cholesterol diet, and weight control.  You are otherwise up to date with prevention measures today.  Please keep your appointments with your specialists as you may have planned  Please go to the LAB in the Basement (turn left off the elevator) for the tests to be done at your convenience  You will be contacted by phone if any changes need to be made immediately.  Otherwise, you will receive a letter about your results with an explanation, but please check with MyChart first.  Please remember to sign up for MyChart if you have not done so, as this will be important to you in the future with finding out test results, communicating by private email, and scheduling acute appointments online when needed.  Please return in 1 year for your yearly visit, or sooner if needed, with Lab testing done 3-5 days before

## 2015-08-25 ENCOUNTER — Other Ambulatory Visit: Payer: Self-pay | Admitting: Internal Medicine

## 2015-10-31 ENCOUNTER — Telehealth: Payer: Self-pay | Admitting: Internal Medicine

## 2015-10-31 NOTE — Telephone Encounter (Signed)
Pt called in and wanted to know if it would be safe for him to fast for church from 6 in the morning til 6 at night.  He wanted dr Jenny Reichmann option

## 2015-10-31 NOTE — Telephone Encounter (Signed)
Please advise, thanks.

## 2015-10-31 NOTE — Telephone Encounter (Signed)
Should be OK for this activity, thanks

## 2015-11-01 NOTE — Telephone Encounter (Signed)
Advised patient of dr johns note 

## 2016-02-20 ENCOUNTER — Other Ambulatory Visit: Payer: Self-pay | Admitting: Internal Medicine

## 2016-02-21 NOTE — Telephone Encounter (Signed)
Please advise, can this be refilled

## 2016-02-21 NOTE — Telephone Encounter (Signed)
Medication faxed

## 2016-02-21 NOTE — Telephone Encounter (Signed)
Done hardcopy to Corinne  

## 2016-04-12 ENCOUNTER — Other Ambulatory Visit: Payer: Self-pay | Admitting: Internal Medicine

## 2016-04-20 ENCOUNTER — Other Ambulatory Visit: Payer: Self-pay | Admitting: Internal Medicine

## 2016-05-17 ENCOUNTER — Ambulatory Visit: Payer: Medicare Other | Admitting: Podiatry

## 2016-07-12 ENCOUNTER — Other Ambulatory Visit: Payer: Self-pay | Admitting: Internal Medicine

## 2016-08-12 ENCOUNTER — Other Ambulatory Visit: Payer: Self-pay | Admitting: Internal Medicine

## 2016-08-28 ENCOUNTER — Other Ambulatory Visit: Payer: Self-pay | Admitting: Internal Medicine

## 2016-08-29 ENCOUNTER — Other Ambulatory Visit: Payer: Self-pay | Admitting: Internal Medicine

## 2016-08-30 NOTE — Telephone Encounter (Signed)
LVM for pt to call back to inform which pharmacy to send RX to and to make an OV with Dr Jenny Reichmann.

## 2016-08-30 NOTE — Telephone Encounter (Signed)
1 month only Done hardcopy to Corinne  Please ask pt to make yearly ROV

## 2016-08-31 ENCOUNTER — Other Ambulatory Visit: Payer: Self-pay | Admitting: Internal Medicine

## 2016-09-03 NOTE — Telephone Encounter (Signed)
Done hardcopy to Corinne  Please contact pt to make yearly f/u OV

## 2016-09-03 NOTE — Telephone Encounter (Signed)
faxed

## 2016-09-13 ENCOUNTER — Ambulatory Visit (INDEPENDENT_AMBULATORY_CARE_PROVIDER_SITE_OTHER): Payer: Medicare Other | Admitting: Internal Medicine

## 2016-09-13 ENCOUNTER — Encounter: Payer: Self-pay | Admitting: Internal Medicine

## 2016-09-13 ENCOUNTER — Other Ambulatory Visit (INDEPENDENT_AMBULATORY_CARE_PROVIDER_SITE_OTHER): Payer: Medicare Other

## 2016-09-13 VITALS — BP 138/78 | HR 90 | Temp 98.0°F | Resp 20 | Wt 169.0 lb

## 2016-09-13 DIAGNOSIS — Z0001 Encounter for general adult medical examination with abnormal findings: Secondary | ICD-10-CM

## 2016-09-13 DIAGNOSIS — N400 Enlarged prostate without lower urinary tract symptoms: Secondary | ICD-10-CM | POA: Insufficient documentation

## 2016-09-13 DIAGNOSIS — Z1159 Encounter for screening for other viral diseases: Secondary | ICD-10-CM

## 2016-09-13 DIAGNOSIS — I10 Essential (primary) hypertension: Secondary | ICD-10-CM

## 2016-09-13 DIAGNOSIS — K219 Gastro-esophageal reflux disease without esophagitis: Secondary | ICD-10-CM | POA: Insufficient documentation

## 2016-09-13 LAB — LIPID PANEL
CHOLESTEROL: 185 mg/dL (ref 0–200)
HDL: 45.6 mg/dL (ref >39.00)
LDL Cholesterol: 104 mg/dL — ABNORMAL HIGH (ref 0–99)
NonHDL: 139.89
Total CHOL/HDL Ratio: 4
Triglycerides: 180 mg/dL — ABNORMAL HIGH (ref 0.0–149.0)
VLDL: 36 mg/dL (ref 0.0–40.0)

## 2016-09-13 LAB — CBC WITH DIFFERENTIAL/PLATELET
BASOS ABS: 0 10*3/uL (ref 0.0–0.1)
Basophils Relative: 0.4 % (ref 0.0–3.0)
Eosinophils Absolute: 0.3 10*3/uL (ref 0.0–0.7)
Eosinophils Relative: 3.9 % (ref 0.0–5.0)
HCT: 45.5 % (ref 39.0–52.0)
Hemoglobin: 15 g/dL (ref 13.0–17.0)
LYMPHS ABS: 2.2 10*3/uL (ref 0.7–4.0)
Lymphocytes Relative: 28.4 % (ref 12.0–46.0)
MCHC: 33 g/dL (ref 30.0–36.0)
MCV: 94.6 fl (ref 78.0–100.0)
MONO ABS: 0.9 10*3/uL (ref 0.1–1.0)
Monocytes Relative: 10.8 % (ref 3.0–12.0)
NEUTROS PCT: 56.5 % (ref 43.0–77.0)
Neutro Abs: 4.4 10*3/uL (ref 1.4–7.7)
Platelets: 400 10*3/uL (ref 150.0–400.0)
RBC: 4.81 Mil/uL (ref 4.22–5.81)
RDW: 14 % (ref 11.5–15.5)
WBC: 7.9 10*3/uL (ref 4.0–10.5)

## 2016-09-13 LAB — HEPATIC FUNCTION PANEL
ALBUMIN: 4.4 g/dL (ref 3.5–5.2)
ALK PHOS: 107 U/L (ref 39–117)
ALT: 13 U/L (ref 0–53)
AST: 14 U/L (ref 0–37)
Bilirubin, Direct: 0.1 mg/dL (ref 0.0–0.3)
TOTAL PROTEIN: 7.4 g/dL (ref 6.0–8.3)
Total Bilirubin: 0.5 mg/dL (ref 0.2–1.2)

## 2016-09-13 LAB — TSH: TSH: 1.25 u[IU]/mL (ref 0.35–4.50)

## 2016-09-13 LAB — URINALYSIS, ROUTINE W REFLEX MICROSCOPIC
Bilirubin Urine: NEGATIVE
Hgb urine dipstick: NEGATIVE
KETONES UR: NEGATIVE
LEUKOCYTES UA: NEGATIVE
Nitrite: NEGATIVE
SPECIFIC GRAVITY, URINE: 1.01 (ref 1.000–1.030)
Total Protein, Urine: NEGATIVE
URINE GLUCOSE: NEGATIVE
UROBILINOGEN UA: 0.2 (ref 0.0–1.0)
WBC, UA: NONE SEEN — AB (ref 0–?)
pH: 6.5 (ref 5.0–8.0)

## 2016-09-13 LAB — BASIC METABOLIC PANEL
BUN: 9 mg/dL (ref 6–23)
CO2: 33 meq/L — AB (ref 19–32)
Calcium: 10.1 mg/dL (ref 8.4–10.5)
Chloride: 101 mEq/L (ref 96–112)
Creatinine, Ser: 1.08 mg/dL (ref 0.40–1.50)
GFR: 90.76 mL/min (ref >60.00)
GLUCOSE: 85 mg/dL (ref 70–99)
POTASSIUM: 4.8 meq/L (ref 3.5–5.1)
SODIUM: 140 meq/L (ref 135–145)

## 2016-09-13 LAB — PSA: PSA: 3.97 ng/mL (ref 0.10–4.00)

## 2016-09-13 MED ORDER — TAMSULOSIN HCL 0.4 MG PO CAPS: ORAL_CAPSULE | ORAL | 11 refills | Status: DC

## 2016-09-13 MED ORDER — ZOLPIDEM TARTRATE 10 MG PO TABS: ORAL_TABLET | ORAL | 1 refills | Status: DC

## 2016-09-13 MED ORDER — PANTOPRAZOLE SODIUM 40 MG PO TBEC: 40.0000 mg | DELAYED_RELEASE_TABLET | Freq: Every day | ORAL | 3 refills | Status: DC

## 2016-09-13 NOTE — Progress Notes (Signed)
Subjective:    Patient ID: Mark Dunlap, male    DOB: [birth year], 55 y.o.   MRN: [id number]  HPI  Here for wellness and f/u;  Overall doing ok;  Pt denies Chest pain, worsening SOB, DOE, wheezing, orthopnea, PND, worsening LE edema, palpitations, dizziness or syncope.  Pt denies neurological change such as new headache, facial or extremity weakness.  Pt denies polydipsia, polyuria, or low sugar symptoms. Pt states overall good compliance with treatment and medications, good tolerability, and has been trying to follow appropriate diet.  Pt denies worsening depressive symptoms, suicidal ideation or panic. No fever, night sweats, wt loss, loss of appetite, or other constitutional symptoms.  Pt states good ability with ADL's, has low fall risk, home safety reviewed and adequate, no other significant changes in hearing or vision, and only occasionally active with exercise.  Has had mild to mod worsening reflux for several months, without abd pain, dysphagia, n/v, bowel change or blood. Also flomax has worked well but recently has had worsening freq and nocturia, but Denies urinary symptoms such as dysuria, urgency, flank pain, hematuria or n/v, fever, chills. Past Medical History:  Diagnosis Date  . Acute prostatitis 05/23/2009  . Adjustment disorder with depressed mood 09/2007  . CERVICAL RADICULOPATHY, LEFT 08/30/2008  . DJD (degenerative joint disease) of hip   . DJD (degenerative joint disease) of knee    left knee  . 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 H/O: substance abuse    hx of ETOH/Crack cocaine-none since 11/08 per pt/Does not Drive due to this  . HYPERTENSION 08/30/2008  . HYPERTHYROIDISM 02/13/2010   pt was told by  Dr Jenny Reichmann  that thyroid was now back to normal .. 2012 ...   . HYPOTENSION 10/31/2009  . 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 Other and unspecified hyperlipidemia  08/20/2013  . Seasonal allergies   . SPINAL STENOSIS, CERVICAL 08/08/2009  . THYROID NODULE, RIGHT 03/02/2010   Past Surgical History:  Procedure Laterality Date  . ANTERIOR CERVICAL DECOMP/DISCECTOMY FUSION  10/11/2011   Procedure: ANTERIOR CERVICAL DECOMPRESSION/DISCECTOMY FUSION 3 LEVELS;  Surgeon: Eustace Moore;  Location: [facility];  Service: Neurosurgery;  Laterality: N/A;  Cervical three-four ,cervical four five cervical five six Anterior Cervical Decompression Fusion with peek + plate Nuvasive translational plate Orthofix peek (2 1/2 hours) Rm # [id number]  . ANTERIOR CERVICAL DECOMP/DISCECTOMY FUSION N/A 07/29/2014   Procedure: ANTERIOR CERVICAL DECOMPRESSION/DISCECTOMY FUSION 1 LEVEL/HARDWARE REMOVAL;  Surgeon: Eustace Moore, MD;  Location: [facility];  Service: Neurosurgery;  Laterality: N/A;  cervical six-seven  . POSTERIOR CERVICAL FUSION/FORAMINOTOMY N/A 09/23/2013   Procedure: CERVICALTWO TO CERVICAL SEVEN POSTERIOR CERVICAL FUSION/FORAMINOTOMY WITH LATERAL MASS FIXATION;  Surgeon: Eustace Moore, MD;  Location: [facility];  Service: Neurosurgery;  Laterality: N/A;  . Right Hand I&D  12/07   s/p rdue to abscess-Dr. Amedeo Plenty  . TOTAL HIP ARTHROPLASTY  12/17/2011   Procedure: TOTAL HIP ARTHROPLASTY;  Surgeon: Johnny Bridge, MD;  Location: [facility];  Service: Orthopedics;  Laterality: Left;    reports that he has been smoking Cigarettes.  He has a 40.00 pack-year smoking history. He has never used smokeless tobacco. He reports that he uses drugs, including "Crack" cocaine. He reports that he does not drink alcohol. family history includes Alcohol abuse in his brother, cousin, and father; Arthritis  in his mother; Diabetes in his brother and sister; Goiter in his mother and sister; Heart disease in his father and other; Hyperlipidemia in his mother; Hypertension in his sister; Stroke in his father. Allergies  Allergen Reactions  . Hydrocodone Itching and Other (See Comments)    10 -325  makes him itch but 10-500 doesn't  . Tramadol Itching  . Morphine And Related Itching   Current Outpatient Prescriptions on File Prior to Visit  Medication Sig Dispense Refill  . adapalene (DIFFERIN) 0.1 % gel Apply 1 application topically at bedtime. 45 g 0  . aspirin 81 MG tablet Take 1 tablet (81 mg total) by mouth daily. 30 tablet   . benzoyl peroxide 5 % gel APPLY TOPICALLY EVERY DAY A THIN LAYER TO AFFECTED AREA  3  . Benzoyl Peroxide 5.5 % CREA Apply 1 application topically daily.    . bimatoprost (LUMIGAN) 0.01 % SOLN Place 1 drop into both eyes at bedtime.    . carboxymethylcellulose (REFRESH PLUS) 0.5 % SOLN Place 1 drop into both eyes 3 (three) times daily as needed (keep eyes clear).     . diphenhydrAMINE (BENADRYL) 25 mg capsule Take 75 mg by mouth every 6 (six) hours as needed for itching.    . fluticasone (FLONASE) 50 MCG/ACT nasal spray Place 2 sprays into both nostrils daily. Yearly physical w/albs are due must see MD for refills 16 g 0  . hydrochlorothiazide (MICROZIDE) 12.5 MG capsule TAKE 1 CAPSULE (12.5 MG TOTAL) BY MOUTH DAILY AS NEEDED. 30 capsule 11  . HYDROcodone-acetaminophen (NORCO) 10-325 MG per tablet Take 1 tablet by mouth every 6 (six) hours as needed for moderate pain. 60 tablet 0  . Multiple Vitamins-Minerals (MULTIVITAMIN WITH MINERALS) tablet Take 2 tablets by mouth daily. gummies not tablets    . ondansetron (ZOFRAN) 4 MG tablet TAKE 1 TABLET (4 MG TOTAL) BY MOUTH EVERY 8 (EIGHT) HOURS AS NEEDED FOR NAUSEA OR VOMITING. 20 tablet 2  . OVER THE COUNTER MEDICATION Mucinex 2 daily    . tiZANidine (ZANAFLEX) 4 MG capsule TAKE 1 CAPSULE BY ORAL ROUTE 3 TIMES EVERY DAY 60 capsule 0  . tretinoin (RETIN-A) 0.1 % cream APPLY TO AFFECTED AREA AT BEDTIME TO FACE  2   No current facility-administered medications on file prior to visit.     Review of Systems Constitutional: Negative for increased diaphoresis, or other activity, appetite or siginficant weight change  other than noted HENT: Negative for worsening hearing loss, ear pain, facial swelling, mouth sores and neck stiffness.   Eyes: Negative for other worsening pain, redness or visual disturbance.  Respiratory: Negative for choking or stridor Cardiovascular: Negative for other chest pain and palpitations.  Gastrointestinal: Negative for worsening diarrhea, blood in stool, or abdominal distention Genitourinary: Negative for hematuria, flank pain or change in urine volume.  Musculoskeletal: Negative for myalgias or other joint complaints.  Skin: Negative for other color change and wound or drainage.  Neurological: Negative for syncope and numbness. other than noted Hematological: Negative for adenopathy. or other swelling Psychiatric/Behavioral: Negative for hallucinations, SI, self-injury, decreased concentration or other worsening agitation.  All other system neg per pt    Objective:   Physical Exam BP 138/78   Pulse 90   Temp 98 F (36.7 C) (Oral)   Resp 20   Wt 169 lb (76.7 kg)   SpO2 95%   BMI 22.30 kg/m  VS noted,  Constitutional: Pt is oriented to person, place, and time. Appears well-developed and well-nourished, in  no significant distress Head: Normocephalic and atraumatic  Eyes: Conjunctivae and EOM are normal. Pupils are equal, round, and reactive to light Right Ear: External ear normal.  Left Ear: External ear normal Nose: Nose normal.  Mouth/Throat: Oropharynx is clear and moist  Neck: Normal range of motion. Neck supple. No JVD present. No tracheal deviation present or significant neck LA or mass Cardiovascular: Normal rate, regular rhythm, normal heart sounds and intact distal pulses.   Pulmonary/Chest: Effort normal and breath sounds without rales or wheezing  Abdominal: Soft. Bowel sounds are normal. NT. No HSM  Musculoskeletal: Normal range of motion. Exhibits no edema Lymphadenopathy: Has no cervical adenopathy.  Neurological: Pt is alert and oriented to person,  place, and time. Pt has normal reflexes. No cranial nerve deficit. Motor grossly intact Skin: Skin is warm and dry. No rash noted or new ulcers Psychiatric:  Has normal mood and affect. Behavior is normal.  No other new exam findings    Assessment & Plan:

## 2016-09-13 NOTE — Progress Notes (Signed)
Pre visit review using our clinic review tool, if applicable. No additional management support is needed unless otherwise documented below in the visit note. 

## 2016-09-13 NOTE — Assessment & Plan Note (Signed)
[place] for increased flomax bid, and refer urology,  to f/u any worsening symptoms or concerns

## 2016-09-13 NOTE — Patient Instructions (Signed)
Please take all new medication as prescribed - the reflux medication  OK to increase the flomax to twice per day  Please continue all other medications as before, and refills have been done if requested - the ambien for sleep  Please have the pharmacy call with any other refills you may need.  Please continue your efforts at being more active, low cholesterol diet, and weight control.  You are otherwise up to date with prevention measures today.  Please keep your appointments with your specialists as you may have planned  You will be contacted regarding the referral for: Urology  Please go to the LAB in the Basement (turn left off the elevator) for the tests to be done today  You will be contacted by phone if any changes need to be made immediately.  Otherwise, you will receive a letter about your results with an explanation, but please check with MyChart first.  Please remember to sign up for MyChart if you have not done so, as this will be important to you in the future with finding out test results, communicating by private email, and scheduling acute appointments online when needed.  Please return in 1 year for your yearly visit, or sooner if needed, with Lab testing done 3-5 days before

## 2016-09-13 NOTE — Assessment & Plan Note (Addendum)
Mild to mod, for PPI asd,  to f/u any worsening symptoms or concerns  In addition to the time spent performing CPE, I spent an additional 15 minutes face to face,in which greater than 50% of this time was spent in counseling and coordination of care for patient's illness as documented.

## 2016-09-13 NOTE — Progress Notes (Signed)
Letter done

## 2016-09-13 NOTE — Assessment & Plan Note (Signed)
stable overall by history and exam, recent data reviewed with pt, and pt to continue medical treatment as before,  to f/u any worsening symptoms or concerns BP Readings from Last 3 Encounters:  09/13/16 138/78  08/08/15 138/90  04/28/15 (!) 132/92

## 2016-09-13 NOTE — Assessment & Plan Note (Signed)

## 2016-09-14 LAB — HEPATITIS C ANTIBODY: HCV Ab: NEGATIVE

## 2017-03-06 ENCOUNTER — Other Ambulatory Visit: Payer: Self-pay | Admitting: Internal Medicine

## 2017-04-14 ENCOUNTER — Other Ambulatory Visit: Payer: Self-pay | Admitting: Internal Medicine

## 2017-04-15 NOTE — Telephone Encounter (Signed)
Faxed

## 2017-04-15 NOTE — Telephone Encounter (Signed)
Done hardcopy to Shirron  

## 2017-05-05 ENCOUNTER — Other Ambulatory Visit: Payer: Self-pay | Admitting: Internal Medicine

## 2017-07-12 ENCOUNTER — Other Ambulatory Visit: Payer: Self-pay | Admitting: Internal Medicine

## 2017-07-29 ENCOUNTER — Other Ambulatory Visit: Payer: Self-pay | Admitting: Internal Medicine

## 2017-07-30 ENCOUNTER — Telehealth: Payer: Self-pay | Admitting: Internal Medicine

## 2017-07-30 DIAGNOSIS — Z0000 Encounter for general adult medical examination without abnormal findings: Secondary | ICD-10-CM

## 2017-07-30 NOTE — Telephone Encounter (Signed)
Pt would like to have labs done prior to his physical on 08/21/2017. Can these orders be put in for him?

## 2017-07-31 NOTE — Telephone Encounter (Signed)
Done

## 2017-07-31 NOTE — Telephone Encounter (Signed)
Let pt know

## 2017-08-08 ENCOUNTER — Other Ambulatory Visit (INDEPENDENT_AMBULATORY_CARE_PROVIDER_SITE_OTHER): Payer: Medicare Other

## 2017-08-08 DIAGNOSIS — Z0000 Encounter for general adult medical examination without abnormal findings: Secondary | ICD-10-CM | POA: Diagnosis not present

## 2017-08-08 LAB — LIPID PANEL
CHOL/HDL RATIO: 4
Cholesterol: 156 mg/dL (ref 0–200)
HDL: 41.4 mg/dL (ref >39.00)
LDL CALC: 85 mg/dL (ref 0–99)
NONHDL: 114.15
Triglycerides: 145 mg/dL (ref 0.0–149.0)
VLDL: 29 mg/dL (ref 0.0–40.0)

## 2017-08-08 LAB — URINALYSIS, ROUTINE W REFLEX MICROSCOPIC
Bilirubin Urine: NEGATIVE
HGB URINE DIPSTICK: NEGATIVE
KETONES UR: NEGATIVE
NITRITE: NEGATIVE
RBC / HPF: NONE SEEN (ref 0–?)
SPECIFIC GRAVITY, URINE: 1.01 (ref 1.000–1.030)
Total Protein, Urine: NEGATIVE
URINE GLUCOSE: NEGATIVE
UROBILINOGEN UA: 0.2 (ref 0.0–1.0)
pH: 6.5 (ref 5.0–8.0)

## 2017-08-08 LAB — BASIC METABOLIC PANEL
BUN: 7 mg/dL (ref 6–23)
CALCIUM: 9.7 mg/dL (ref 8.4–10.5)
CHLORIDE: 100 meq/L (ref 96–112)
CO2: 32 meq/L (ref 19–32)
Creatinine, Ser: 0.88 mg/dL (ref 0.40–1.50)
GFR: 114.59 mL/min (ref >60.00)
Glucose, Bld: 98 mg/dL (ref 70–99)
Potassium: 4.2 mEq/L (ref 3.5–5.1)
SODIUM: 138 meq/L (ref 135–145)

## 2017-08-08 LAB — HEPATIC FUNCTION PANEL
ALK PHOS: 95 U/L (ref 39–117)
ALT: 11 U/L (ref 0–53)
AST: 14 U/L (ref 0–37)
Albumin: 3.7 g/dL (ref 3.5–5.2)
BILIRUBIN DIRECT: 0.1 mg/dL (ref 0.0–0.3)
TOTAL PROTEIN: 6.4 g/dL (ref 6.0–8.3)
Total Bilirubin: 0.5 mg/dL (ref 0.2–1.2)

## 2017-08-08 LAB — PSA: PSA: 3.73 ng/mL (ref 0.10–4.00)

## 2017-08-08 LAB — TSH: TSH: 1.25 u[IU]/mL (ref 0.35–4.50)

## 2017-08-21 ENCOUNTER — Ambulatory Visit (INDEPENDENT_AMBULATORY_CARE_PROVIDER_SITE_OTHER): Payer: Medicare Other | Admitting: Internal Medicine

## 2017-08-21 ENCOUNTER — Encounter: Payer: Self-pay | Admitting: Internal Medicine

## 2017-08-21 VITALS — BP 114/76 | HR 70 | Temp 97.6°F | Ht 73.0 in | Wt 162.0 lb

## 2017-08-21 DIAGNOSIS — Z23 Encounter for immunization: Secondary | ICD-10-CM | POA: Diagnosis not present

## 2017-08-21 DIAGNOSIS — Z0000 Encounter for general adult medical examination without abnormal findings: Secondary | ICD-10-CM

## 2017-08-21 MED ORDER — ZOLPIDEM TARTRATE 10 MG PO TABS: 10.0000 mg | ORAL_TABLET | Freq: Every evening | ORAL | 1 refills | Status: DC | PRN

## 2017-08-21 MED ORDER — TRETINOIN 0.1 % EX CREA: TOPICAL_CREAM | CUTANEOUS | 2 refills | Status: DC

## 2017-08-21 MED ORDER — PANTOPRAZOLE SODIUM 40 MG PO TBEC: 40.0000 mg | DELAYED_RELEASE_TABLET | Freq: Every day | ORAL | 3 refills | Status: DC

## 2017-08-21 MED ORDER — HYDROCHLOROTHIAZIDE 12.5 MG PO CAPS
12.5000 mg | ORAL_CAPSULE | Freq: Every day | ORAL | 3 refills | Status: DC
Start: 2017-08-21 — End: 2018-08-13

## 2017-08-21 MED ORDER — BENZOYL PEROXIDE 5 % EX LIQD: Freq: Two times a day (BID) | CUTANEOUS | 12 refills | Status: DC

## 2017-08-21 MED ORDER — TIZANIDINE HCL 4 MG PO CAPS: ORAL_CAPSULE | ORAL | 2 refills | Status: DC

## 2017-08-21 MED ORDER — TAMSULOSIN HCL 0.4 MG PO CAPS: ORAL_CAPSULE | ORAL | 3 refills | Status: DC

## 2017-08-21 MED ORDER — ADAPALENE 0.1 % EX GEL: 1.0000 "application " | Freq: Every day | CUTANEOUS | 0 refills | Status: DC

## 2017-08-21 MED ORDER — FLUTICASONE PROPIONATE 50 MCG/ACT NA SUSP: 2.0000 | Freq: Every day | NASAL | 5 refills | Status: DC

## 2017-08-21 NOTE — Patient Instructions (Signed)
Please continue all other medications as before, and refills have been done if requested.  Please have the pharmacy call with any other refills you may need.  Please continue your efforts at being more active, low cholesterol diet, and weight control.  You are otherwise up to date with prevention measures today.  Please keep your appointments with your specialists as you may have planned  Please return in 1 year for your yearly visit, or sooner if needed, with Lab testing done 3-5 days before  

## 2017-08-21 NOTE — Progress Notes (Signed)
Subjective:    Patient ID: Mark Dunlap, male    DOB: Jul 18, 1961, 57 y.o.   MRN: [id number]  HPI n Here for wellness and f/u;  Overall doing ok;  Pt denies Chest pain, worsening SOB, DOE, wheezing, orthopnea, PND, worsening LE edema, palpitations, dizziness or syncope.  Pt denies neurological change such as new headache, facial or extremity weakness.  Pt denies polydipsia, polyuria, or low sugar symptoms. Pt states overall good compliance with treatment and medications, good tolerability, and has been trying to follow appropriate diet.  Pt denies worsening depressive symptoms, suicidal ideation or panic. No fever, night sweats, wt loss, loss of appetite, or other constitutional symptoms.  Pt states good ability with ADL's, has low fall risk, home safety reviewed and adequate, no other significant changes in hearing or vision, and only occasionally active with exercise.  No other interval hx Past Medical History:  Diagnosis Date  . Acute prostatitis 05/23/2009  . Adjustment disorder with depressed mood 09/2007  . CERVICAL RADICULOPATHY, LEFT 08/30/2008  . DJD (degenerative joint disease) of hip   . DJD (degenerative joint disease) of knee    left knee  . 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 H/O: substance abuse    hx of ETOH/Crack cocaine-none since 11/08 per pt/Does not Drive due to this  . HYPERTENSION 08/30/2008  . HYPERTHYROIDISM 02/13/2010   pt was told by  Dr Jenny Reichmann  that thyroid was now back to normal .. 2012 ...   . HYPOTENSION 10/31/2009  . 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 Other and unspecified hyperlipidemia 08/20/2013  . Seasonal allergies   . SPINAL STENOSIS, CERVICAL 08/08/2009  . THYROID NODULE, RIGHT 03/02/2010   Past Surgical History:  Procedure Laterality Date  . Right Hand I&D  12/07   s/p rdue to abscess-Dr. Amedeo Plenty    reports that he has been smoking  cigarettes.  He has a 40.00 pack-year smoking history. he has never used smokeless tobacco. He reports that he uses drugs. Drug: "Crack" cocaine. He reports that he does not drink alcohol. family history includes Alcohol abuse in his brother, cousin, and father; Arthritis in his mother; Diabetes in his brother and sister; Goiter in his mother and sister; Heart disease in his father and other; Hyperlipidemia in his mother; Hypertension in his sister; Stroke in his father. Allergies  Allergen Reactions  . Hydrocodone Itching and Other (See Comments)    10 -325 makes him itch but 10-500 doesn't  . Tramadol Itching  . Morphine And Related Itching   Current Outpatient Medications on File Prior to Visit  Medication Sig Dispense Refill  . aspirin 81 MG tablet Take 1 tablet (81 mg total) by mouth daily. 30 tablet   . bimatoprost (LUMIGAN) 0.01 % SOLN Place 1 drop into both eyes at bedtime.    . carboxymethylcellulose (REFRESH PLUS) 0.5 % SOLN Place 1 drop into both eyes 3 (three) times daily as needed (keep eyes clear).     . diphenhydrAMINE (BENADRYL) 25 mg capsule Take 75 mg by mouth every 6 (six) hours as needed for itching.    [name] HYDROcodone-acetaminophen (NORCO) 10-325 MG per tablet Take 1 tablet by mouth every 6 (six) hours as needed for moderate pain. 60 tablet 0  . Multiple Vitamins-Minerals (MULTIVITAMIN WITH MINERALS) tablet Take 2 tablets by mouth daily. gummies not tablets    .  ondansetron (ZOFRAN) 4 MG tablet TAKE 1 TABLET (4 MG TOTAL) BY MOUTH EVERY 8 (EIGHT) HOURS AS NEEDED FOR NAUSEA OR VOMITING. 20 tablet 2  . OVER THE COUNTER MEDICATION Mucinex 2 daily     No current facility-administered medications on file prior to visit.    Review of Systems Constitutional: Negative for other unusual diaphoresis, sweats, appetite or weight changes HENT: Negative for other worsening hearing loss, ear pain, facial swelling, mouth sores or neck stiffness.   Eyes: Negative for other worsening pain,  redness or other visual disturbance.  Respiratory: Negative for other stridor or swelling Cardiovascular: Negative for other palpitations or other chest pain  Gastrointestinal: Negative for worsening diarrhea or loose stools, blood in stool, distention or other pain Genitourinary: Negative for hematuria, flank pain or other change in urine volume.  Musculoskeletal: Negative for myalgias or other joint swelling.  Skin: Negative for other color change, or other wound or worsening drainage.  Neurological: Negative for other syncope or numbness. Hematological: Negative for other adenopathy or swelling Psychiatric/Behavioral: Negative for hallucinations, other worsening agitation, SI, self-injury, or new decreased concentration All other system neg per pt    Objective:   Physical Exam BP 114/76   Pulse 70   Temp 97.6 F (36.4 C) (Oral)   Ht 6\' 1"  (1.854 m)   Wt 162 lb (73.5 kg)   SpO2 99%   BMI 21.37 kg/m  VS noted,  Constitutional: Pt is oriented to person, place, and time. Appears well-developed and well-nourished, in no significant distress and comfortable Head: Normocephalic and atraumatic  Eyes: Conjunctivae and EOM are normal. Pupils are equal, round, and reactive to light Right Ear: External ear normal without discharge Left Ear: External ear normal without discharge Nose: Nose without discharge or deformity Mouth/Throat: Oropharynx is without other ulcerations and moist  Neck: Normal range of motion. Neck supple. No JVD present. No tracheal deviation present or significant neck LA or mass Cardiovascular: Normal rate, regular rhythm, normal heart sounds and intact distal pulses.   Pulmonary/Chest: WOB normal and breath sounds without rales or wheezing  Abdominal: Soft. Bowel sounds are normal. NT. No HSM  Musculoskeletal: Normal range of motion. Exhibits no edema Lymphadenopathy: Has no other cervical adenopathy.  Neurological: Pt is alert and oriented to person, place, and  time. Pt has normal reflexes. No cranial nerve deficit. Motor grossly intact, Gait intact Skin: Skin is warm and dry. No rash noted or new ulcerations Psychiatric:  Has normal mood and affect. Behavior is normal without agitation No other exam findings Lab Results  Component Value Date   WBC 7.9 09/13/2016   HGB 15.0 09/13/2016   HCT 45.5 09/13/2016   PLT 400.0 09/13/2016   GLUCOSE 98 08/08/2017   CHOL 156 08/08/2017   TRIG 145.0 08/08/2017   HDL 41.40 08/08/2017   LDLDIRECT 80.0 04/18/2015   LDLCALC 85 08/08/2017   ALT 11 08/08/2017   AST 14 08/08/2017   NA 138 08/08/2017   K 4.2 08/08/2017   CL 100 08/08/2017   CREATININE 0.88 08/08/2017   BUN 7 08/08/2017   CO2 32 08/08/2017   TSH 1.25 08/08/2017   PSA 3.73 08/08/2017   INR 0.96 07/22/2014       Assessment & Plan:

## 2017-08-21 NOTE — Assessment & Plan Note (Signed)

## 2017-08-28 ENCOUNTER — Ambulatory Visit: Payer: Medicare Other | Admitting: Nurse Practitioner

## 2017-08-28 ENCOUNTER — Other Ambulatory Visit: Payer: Medicare Other

## 2017-08-28 ENCOUNTER — Encounter: Payer: Self-pay | Admitting: Nurse Practitioner

## 2017-08-28 ENCOUNTER — Ambulatory Visit (INDEPENDENT_AMBULATORY_CARE_PROVIDER_SITE_OTHER)
Admission: RE | Admit: 2017-08-28 | Discharge: 2017-08-28 | Disposition: A | Discharge status: Home / Self Care | Payer: Medicare Other | Source: Ambulatory Visit | Attending: Nurse Practitioner | Admitting: Nurse Practitioner

## 2017-08-28 VITALS — BP 130/76 | HR 76 | Temp 97.9°F | Ht 73.0 in | Wt 161.0 lb

## 2017-08-28 DIAGNOSIS — R350 Frequency of micturition: Secondary | ICD-10-CM | POA: Diagnosis not present

## 2017-08-28 DIAGNOSIS — R310 Gross hematuria: Secondary | ICD-10-CM | POA: Diagnosis not present

## 2017-08-28 DIAGNOSIS — R32 Unspecified urinary incontinence: Secondary | ICD-10-CM | POA: Diagnosis not present

## 2017-08-28 LAB — POCT URINALYSIS DIPSTICK
BILIRUBIN UA: NEGATIVE
Glucose, UA: NEGATIVE
Ketones, UA: NEGATIVE
NITRITE UA: NEGATIVE
PH UA: 6 (ref 5.0–8.0)
PROTEIN UA: NEGATIVE
Spec Grav, UA: 1.01 (ref 1.010–1.025)
UROBILINOGEN UA: 0.2 U/dL

## 2017-08-28 MED ORDER — CIPROFLOXACIN HCL 500 MG PO TABS: 500.0000 mg | ORAL_TABLET | Freq: Two times a day (BID) | ORAL | 0 refills | Status: DC

## 2017-08-28 NOTE — Patient Instructions (Addendum)
No acute finding on CT. Entered ref to urology  Push oral hydration.  Start cipro as prescribed.

## 2017-08-28 NOTE — Progress Notes (Signed)
Subjective:  Patient ID: Mark Dunlap, male    DOB: Aug 07, 1961  Age: 56 y.o. MRN: [id number]  CC: Urinary Tract Infection (frequent urinate/ out of work note?)   Urinary Tract Infection   This is a new problem. The current episode started yesterday. The problem occurs every urination. The problem has been unchanged. The patient is experiencing no pain. There has been no fever. He is not sexually active. There is no history of pyelonephritis. Associated symptoms include frequency, hematuria and urgency. Pertinent negatives include no chills, discharge, flank pain, hesitancy or nausea. He has tried nothing for the symptoms. His past medical history is significant for urinary stasis.    he not sexually active Outpatient Medications Prior to Visit  Medication Sig Dispense Refill  . adapalene (DIFFERIN) 0.1 % gel Apply 1 application at bedtime topically. 45 g 0  . aspirin 81 MG tablet Take 1 tablet (81 mg total) by mouth daily. 30 tablet   . benzoyl peroxide (BENZOYL PEROXIDE) 5 % external liquid Apply 2 (two) times daily topically. 142 g 12  . bimatoprost (LUMIGAN) 0.01 % SOLN Place 1 drop into both eyes at bedtime.    . carboxymethylcellulose (REFRESH PLUS) 0.5 % SOLN Place 1 drop into both eyes 3 (three) times daily as needed (keep eyes clear).     . diphenhydrAMINE (BENADRYL) 25 mg capsule Take 75 mg by mouth every 6 (six) hours as needed for itching.    . fluticasone (FLONASE) 50 MCG/ACT nasal spray Place 2 sprays daily into both nostrils. 16 g 5  . hydrochlorothiazide (MICROZIDE) 12.5 MG capsule Take 1 capsule (12.5 mg total) daily by mouth. 90 capsule 3  . HYDROcodone-acetaminophen (NORCO) 10-325 MG per tablet Take 1 tablet by mouth every 6 (six) hours as needed for moderate pain. 60 tablet 0  . Multiple Vitamins-Minerals (MULTIVITAMIN WITH MINERALS) tablet Take 2 tablets by mouth daily. gummies not tablets    . ondansetron (ZOFRAN) 4 MG tablet TAKE 1 TABLET (4 MG TOTAL) BY MOUTH EVERY  8 (EIGHT) HOURS AS NEEDED FOR NAUSEA OR VOMITING. 20 tablet 2  . OVER THE COUNTER MEDICATION Mucinex 2 daily    . pantoprazole (PROTONIX) 40 MG tablet Take 1 tablet (40 mg total) daily by mouth. 90 tablet 3  . tamsulosin (FLOMAX) 0.4 MG CAPS capsule TAKE 1 CAPSULE (0.4 MG TOTAL) BY MOUTH twice daily 180 capsule 3  . tiZANidine (ZANAFLEX) 4 MG capsule TAKE 1 CAPSULE BY ORAL ROUTE 3 TIMES EVERY DAY 60 capsule 2  . tretinoin (RETIN-A) 0.1 % cream APPLY TO AFFECTED AREA AT BEDTIME TO FACE 45 g 2  . zolpidem (AMBIEN) 10 MG tablet Take 1 tablet (10 mg total) at bedtime as needed by mouth. for sleep 90 tablet 1   No facility-administered medications prior to visit.     ROS See HPI  Objective:  BP 130/76   Pulse 76   Temp 97.9 F (36.6 C)   Ht 6\' 1"  (1.854 m)   Wt 161 lb (73 kg)   SpO2 99%   BMI 21.24 kg/m   BP Readings from Last 3 Encounters:  08/28/17 130/76  08/21/17 114/76  09/13/16 138/78    Wt Readings from Last 3 Encounters:  08/28/17 161 lb (73 kg)  08/21/17 162 lb (73.5 kg)  09/13/16 169 lb (76.7 kg)    Physical Exam  Constitutional: He is oriented to person, place, and time. No distress.  Cardiovascular: Normal rate.  Pulmonary/Chest: Effort normal.  Abdominal: Soft. He exhibits  no distension. There is no tenderness.  Musculoskeletal: He exhibits no edema or tenderness.  Neurological: He is alert and oriented to person, place, and time.  Skin: Skin is warm and dry. No rash noted. No erythema.  Psychiatric: He has a normal mood and affect. His behavior is normal.  Vitals reviewed.   Lab Results  Component Value Date   WBC 7.9 09/13/2016   HGB 15.0 09/13/2016   HCT 45.5 09/13/2016   PLT 400.0 09/13/2016   GLUCOSE 98 08/08/2017   CHOL 156 08/08/2017   TRIG 145.0 08/08/2017   HDL 41.40 08/08/2017   LDLDIRECT 80.0 04/18/2015   LDLCALC 85 08/08/2017   ALT 11 08/08/2017   AST 14 08/08/2017   NA 138 08/08/2017   K 4.2 08/08/2017   CL 100 08/08/2017    CREATININE 0.88 08/08/2017   BUN 7 08/08/2017   CO2 32 08/08/2017   TSH 1.25 08/08/2017   PSA 3.73 08/08/2017   INR 0.96 07/22/2014    Assessment & Plan:   Shadrach was seen today for urinary tract infection.  Diagnoses and all orders for this visit:  Gross hematuria -     ciprofloxacin (CIPRO) 500 MG tablet; Take 1 tablet (500 mg total) 2 (two) times daily by mouth. -     Urine Culture; Future -     Ambulatory referral to Urology  Urinary frequency -     ciprofloxacin (CIPRO) 500 MG tablet; Take 1 tablet (500 mg total) 2 (two) times daily by mouth. -     Urine Culture; Future -     POCT urinalysis dipstick -     Ambulatory referral to Urology  Urinary incontinence, unspecified type -     CT RENAL STONE STUDY; Future -     Urine Culture; Future -     Ambulatory referral to Urology   I am having [name] start on ciprofloxacin. I am also having him maintain his bimatoprost, carboxymethylcellulose, multivitamin with minerals, diphenhydrAMINE, aspirin, HYDROcodone-acetaminophen, OVER THE COUNTER MEDICATION, ondansetron, zolpidem, tiZANidine, tamsulosin, pantoprazole, hydrochlorothiazide, fluticasone, adapalene, tretinoin, and benzoyl peroxide.  Meds ordered this encounter  Medications  . ciprofloxacin (CIPRO) 500 MG tablet    Sig: Take 1 tablet (500 mg total) 2 (two) times daily by mouth.    Dispense:  14 tablet    Refill:  0    Order Specific Question:   Supervising Provider    Answer:   Cassandria Anger [[id number]]    Follow-up: No Follow-up on file.  Wilfred Lacy, NP

## 2017-08-30 LAB — URINE CULTURE
MICRO NUMBER:: 81289831
SPECIMEN QUALITY: ADEQUATE

## 2017-09-01 ENCOUNTER — Telehealth: Payer: Self-pay | Admitting: Internal Medicine

## 2017-09-01 MED ORDER — RANITIDINE HCL 150 MG PO TABS: 150.0000 mg | ORAL_TABLET | Freq: Two times a day (BID) | ORAL | 3 refills | Status: DC

## 2017-09-01 NOTE — Telephone Encounter (Signed)
Pt called stating his  pantoprazole (PROTONIX) 40 MG tablet is no longer working, his acid reflux has gotten very bad and he would like a diferent medication called in,  Please advise and call back in regard  [facility] to [facility] [address]

## 2017-09-01 NOTE — Telephone Encounter (Signed)
Pt has been informed and expressed understanding.  

## 2017-09-01 NOTE — Telephone Encounter (Signed)
Ok to ask pt to try zantac 150 bid  CONTINUE the protonix as well  Please call by first of next week if still now doing well, and I can refer to GI

## 2017-10-14 HISTORY — PX: BACK SURGERY: SHX140

## 2017-11-17 ENCOUNTER — Other Ambulatory Visit: Payer: Self-pay | Admitting: Neurological Surgery

## 2017-11-20 NOTE — Pre-Procedure Instructions (Signed)
Mark Dunlap  11/20/2017      [id number] [address], [place] [address] Phone: [phone] Fax: [phone]    Your procedure is scheduled on Feb 14  Report to [facility] at 1115 A.M.  Call this number if you have problems the morning of surgery:  [phone]   Remember:  Do not eat food or drink liquids after midnight.  Take these medicines the morning of surgery with A SIP OF WATER flonase spray if needed, gabapentin (Neurontin), Hydrocodone (Norco) if needed, Sprix spray if needed, Ondansetron (Zofran) if needed, Pantoprazole (Protonix), ranitidine (Zantac), Flomax, Timoptic eye drops Zanaflex if needed,   Stop aspirin as directed by your Dr. Stop taking BC's, Goody's, Herbal medications, Fish Oil, Aleve, Ibuprofen, Advil, Motrin, Vitamins   Do not wear jewelry, make-up or nail polish.  Do not wear lotions, powders, or perfumes, or deodorant.  Do not shave 48 hours prior to surgery.  Men may shave face and neck.  Do not bring valuables to the hospital.  [facility] is not responsible for any belongings or valuables.  Contacts, dentures or bridgework may not be worn into surgery.  Leave your suitcase in the car.  After surgery it may be brought to your room.  For patients admitted to the hospital, discharge time will be determined by your treatment team.  Patients discharged the day of surgery will not be allowed to drive home.   Special instructions:  [facility] - Preparing for Surgery  Before surgery, you can play an important role.  Because skin is not sterile, your skin needs to be as free of germs as possible.  You can reduce the number of germs on you skin by washing with CHG (chlorahexidine gluconate) soap before surgery.  CHG is an antiseptic cleaner which kills germs and bonds with the skin to continue killing germs even after washing.  Please DO NOT use if you have an allergy to  CHG or antibacterial soaps.  If your skin becomes reddened/irritated stop using the CHG and inform your nurse when you arrive at Short Stay.  Do not shave (including legs and underarms) for at least 48 hours prior to the first CHG shower.  You may shave your face.  Please follow these instructions carefully:   1.  Shower with CHG Soap the night before surgery and the                                morning of Surgery.  2.  If you choose to wash your hair, wash your hair first as usual with your       normal shampoo.  3.  After you shampoo, rinse your hair and body thoroughly to remove the                      Shampoo.  4.  Use CHG as you would any other liquid soap.  You can apply chg directly       to the skin and wash gently with scrungie or a clean washcloth.  5.  Apply the CHG Soap to your body ONLY FROM THE NECK DOWN.        Do not use on open wounds or open sores.  Avoid contact with your eyes,       ears, mouth and genitals (private parts).  Wash genitals (  private parts)       with your normal soap.  6.  Wash thoroughly, paying special attention to the area where your surgery        will be performed.  7.  Thoroughly rinse your body with warm water from the neck down.  8.  DO NOT shower/wash with your normal soap after using and rinsing off       the CHG Soap.  9.  Pat yourself dry with a clean towel.            10.  Wear clean pajamas.            11.  Place clean sheets on your bed the night of your first shower and do not        sleep with pets.  Day of Surgery  Do not apply any lotions/deoderants the morning of surgery.  Please wear clean clothes to the hospital/surgery center.     Please read over the following fact sheets that you were given. Pain Booklet, Coughing and Deep Breathing, MRSA Information and Surgical Site Infection Prevention

## 2017-11-21 ENCOUNTER — Other Ambulatory Visit: Payer: Self-pay

## 2017-11-21 ENCOUNTER — Encounter (HOSPITAL_COMMUNITY): Payer: Self-pay

## 2017-11-21 ENCOUNTER — Encounter (HOSPITAL_COMMUNITY)
Admission: RE | Admit: 2017-11-21 | Discharge: 2017-11-21 | Disposition: A | Discharge status: Home / Self Care | Payer: Medicare Other | Source: Ambulatory Visit | Attending: Neurological Surgery | Admitting: Neurological Surgery

## 2017-11-21 DIAGNOSIS — Z01810 Encounter for preprocedural cardiovascular examination: Secondary | ICD-10-CM | POA: Insufficient documentation

## 2017-11-21 DIAGNOSIS — Z01812 Encounter for preprocedural laboratory examination: Secondary | ICD-10-CM | POA: Insufficient documentation

## 2017-11-21 HISTORY — DX: Other specified postprocedural states: Z98.890

## 2017-11-21 HISTORY — DX: Gastro-esophageal reflux disease without esophagitis: K21.9

## 2017-11-21 HISTORY — DX: Nausea with vomiting, unspecified: R11.2

## 2017-11-21 LAB — BASIC METABOLIC PANEL
ANION GAP: 13 (ref 5–15)
BUN: 10 mg/dL (ref 6–20)
CALCIUM: 8.7 mg/dL — AB (ref 8.9–10.3)
CO2: 24 mmol/L (ref 22–32)
CREATININE: 1.16 mg/dL (ref 0.61–1.24)
Chloride: 96 mmol/L — ABNORMAL LOW (ref 101–111)
GFR calc Af Amer: >60 mL/min (ref >60)
Glucose, Bld: 103 mg/dL — ABNORMAL HIGH (ref 65–99)
Potassium: 3.4 mmol/L — ABNORMAL LOW (ref 3.5–5.1)
Sodium: 133 mmol/L — ABNORMAL LOW (ref 135–145)

## 2017-11-21 LAB — CBC
HCT: 34.9 % — ABNORMAL LOW (ref 39.0–52.0)
Hemoglobin: 11.6 g/dL — ABNORMAL LOW (ref 13.0–17.0)
MCH: 29.8 pg (ref 26.0–34.0)
MCHC: 33.2 g/dL (ref 30.0–36.0)
MCV: 89.7 fL (ref 78.0–100.0)
PLATELETS: 423 10*3/uL — AB (ref 150–400)
RBC: 3.89 MIL/uL — ABNORMAL LOW (ref 4.22–5.81)
RDW: 14.3 % (ref 11.5–15.5)
WBC: 9.8 10*3/uL (ref 4.0–10.5)

## 2017-11-21 LAB — SURGICAL PCR SCREEN
MRSA, PCR: NEGATIVE
STAPHYLOCOCCUS AUREUS: NEGATIVE

## 2017-11-21 LAB — TYPE AND SCREEN
ABO/RH(D): B POS
Antibody Screen: NEGATIVE

## 2017-11-21 NOTE — Progress Notes (Signed)
PCP is Dr. Cathlean Cower Denies seeing a cardiologist. Denies ever having a card cath or echo Stress test noted 08-31-13 Denies chest pain, fever, or cough.

## 2017-11-24 NOTE — Progress Notes (Signed)
Anesthesia Chart Review:  Patient is a 57 year old male scheduled for L4-5 laminectomy/foraminotomy with posterolateral fusion, instrumentation on 11/27/17 by Dr. Sherley Bounds.  History includes smoking, post-operative N/V, HTN, HLD, glaucoma, legally blind (left eye, decreased vision right eye), GERD. right thyroid nodule, hyperthyroidism (s/p RAI therpay 2011) , adjustment disorder with depressed mood, substance abuse (including ETOH/cocaine; by history, none > 1 year by history), DJD, prostatitis, left THA '13, C3-6 ACDF '12, C2-7 posterior fusion 09/23/13, C6-7 ACDF with removal of anterior cervical plating C3-6 07/29/14.   PCP is Dr. Cathlean Cower. Last visit 08/21/17 for wellness visit and follow-up. By notes, he was overall doing ok and denied chest pain, worsening SOB, DOE, wheezing, orthopnea, PND, worsening LE edema, palpitations, dizziness or syncope.  Meds include ASA 81 mg (hold as directed by surgeon), Lumigan, Flonase, Neurontin, HCTZ, Norco, Sprix spray, Pamelor, Zofran, Ditropan, Protonix, Zantac, Flomax, Timoptic, Zanaflex, Ambien.  BP (!) 168/87   Pulse 85   Temp 36.6 C (Oral)   Resp 18   Ht 6\' 1"  (1.854 m)   Wt 163 lb 9 oz (74.2 kg)   SpO2 94%   BMI 21.58 kg/m  BP 182/93, recheck 168/87. (BP 130/76 at 08/28/17 [place] office visit.)  EKG 11/21/17: NSR, LAFB, LVH with QRS widening. The inferolateral T wave inversion has resolved since last tracing 06/06/14.   Nuclear stress test 08/30/13 (ordered by Dr. Cathlean Cower for evaluation of abnormal EKG): Overall Impression:  Normal stress nuclear study. LV Ejection Fraction: 50%.  LV Wall Motion:  NL LV Function; NL Wall Motion  Preoperative labs noted. K 3.4, Na 133, Cl 96, glucose 103. Cr 1.16. H/H 11.6/34.9, PLT 423.   He denied chest pain, fever, cough at PAT. Patient's BP was more elevated in comparison to last few readings at [facility]. He is on HCTZ. He does have LVH on EKG, but no significant T wave abnormalities.  Last stress test 08/2013.  Reortedly, he is no longer using cocaine, so will defer decision for UDS to anesthesiologist or surgeon on the day of surgery.  [facility] Short Stay Center/Anesthesiology Phone [phone] 11/24/2017 3:49 PM

## 2017-11-27 ENCOUNTER — Inpatient Hospital Stay (HOSPITAL_COMMUNITY)
Admission: RE | Admit: 2017-11-27 | Discharge: 2017-11-28 | DRG: 460 | Disposition: A | Discharge status: Home / Self Care | Payer: Medicare Other | Source: Ambulatory Visit | Attending: Neurological Surgery | Admitting: Neurological Surgery

## 2017-11-27 ENCOUNTER — Encounter (HOSPITAL_COMMUNITY): Payer: Self-pay | Admitting: *Deleted

## 2017-11-27 ENCOUNTER — Inpatient Hospital Stay (HOSPITAL_COMMUNITY): Payer: Medicare Other | Admitting: Vascular Surgery

## 2017-11-27 ENCOUNTER — Inpatient Hospital Stay (HOSPITAL_COMMUNITY)
Admission: RE | Disposition: A | Discharge status: Home / Self Care | Payer: Self-pay | Source: Ambulatory Visit | Attending: Neurological Surgery

## 2017-11-27 ENCOUNTER — Inpatient Hospital Stay (HOSPITAL_COMMUNITY): Payer: Medicare Other | Admitting: Anesthesiology

## 2017-11-27 ENCOUNTER — Inpatient Hospital Stay (HOSPITAL_COMMUNITY): Payer: Medicare Other

## 2017-11-27 ENCOUNTER — Other Ambulatory Visit: Payer: Self-pay

## 2017-11-27 DIAGNOSIS — Z981 Arthrodesis status: Secondary | ICD-10-CM

## 2017-11-27 DIAGNOSIS — Z79891 Long term (current) use of opiate analgesic: Secondary | ICD-10-CM

## 2017-11-27 DIAGNOSIS — Z79899 Other long term (current) drug therapy: Secondary | ICD-10-CM | POA: Diagnosis not present

## 2017-11-27 DIAGNOSIS — Z8249 Family history of ischemic heart disease and other diseases of the circulatory system: Secondary | ICD-10-CM

## 2017-11-27 DIAGNOSIS — H409 Unspecified glaucoma: Secondary | ICD-10-CM | POA: Diagnosis present

## 2017-11-27 DIAGNOSIS — I10 Essential (primary) hypertension: Secondary | ICD-10-CM | POA: Diagnosis present

## 2017-11-27 DIAGNOSIS — Z7982 Long term (current) use of aspirin: Secondary | ICD-10-CM | POA: Diagnosis not present

## 2017-11-27 DIAGNOSIS — K219 Gastro-esophageal reflux disease without esophagitis: Secondary | ICD-10-CM | POA: Diagnosis present

## 2017-11-27 DIAGNOSIS — Z833 Family history of diabetes mellitus: Secondary | ICD-10-CM

## 2017-11-27 DIAGNOSIS — M48061 Spinal stenosis, lumbar region without neurogenic claudication: Secondary | ICD-10-CM | POA: Diagnosis present

## 2017-11-27 DIAGNOSIS — E7849 Other hyperlipidemia: Secondary | ICD-10-CM | POA: Diagnosis present

## 2017-11-27 DIAGNOSIS — Z8349 Family history of other endocrine, nutritional and metabolic diseases: Secondary | ICD-10-CM | POA: Diagnosis not present

## 2017-11-27 DIAGNOSIS — Z823 Family history of stroke: Secondary | ICD-10-CM

## 2017-11-27 DIAGNOSIS — Z8261 Family history of arthritis: Secondary | ICD-10-CM

## 2017-11-27 DIAGNOSIS — Z811 Family history of alcohol abuse and dependence: Secondary | ICD-10-CM | POA: Diagnosis not present

## 2017-11-27 DIAGNOSIS — Z96642 Presence of left artificial hip joint: Secondary | ICD-10-CM | POA: Diagnosis present

## 2017-11-27 DIAGNOSIS — M1710 Unilateral primary osteoarthritis, unspecified knee: Secondary | ICD-10-CM | POA: Diagnosis present

## 2017-11-27 DIAGNOSIS — M4316 Spondylolisthesis, lumbar region: Principal | ICD-10-CM | POA: Diagnosis present

## 2017-11-27 DIAGNOSIS — H548 Legal blindness, as defined in USA: Secondary | ICD-10-CM | POA: Diagnosis present

## 2017-11-27 DIAGNOSIS — Z885 Allergy status to narcotic agent status: Secondary | ICD-10-CM | POA: Diagnosis not present

## 2017-11-27 DIAGNOSIS — F17210 Nicotine dependence, cigarettes, uncomplicated: Secondary | ICD-10-CM | POA: Diagnosis present

## 2017-11-27 DIAGNOSIS — Z419 Encounter for procedure for purposes other than remedying health state, unspecified: Secondary | ICD-10-CM

## 2017-11-27 SURGERY — POSTERIOR LUMBAR FUSION 1 LEVEL
Anesthesia: General | Site: Spine Lumbar

## 2017-11-27 MED ORDER — MIDAZOLAM HCL 5 MG/5ML IJ SOLN
INTRAMUSCULAR | Status: DC | PRN
  Administered 2017-11-27: 2 mg via INTRAVENOUS

## 2017-11-27 MED ORDER — ACETAMINOPHEN 325 MG PO TABS
ORAL_TABLET | ORAL | Status: AC
  Filled 2017-11-27: qty 2

## 2017-11-27 MED ORDER — MENTHOL 3 MG MT LOZG: 1.0000 | LOZENGE | OROMUCOSAL | Status: DC | PRN

## 2017-11-27 MED ORDER — POLYVINYL ALCOHOL 1.4 % OP SOLN
1.0000 [drp] | Freq: Three times a day (TID) | OPHTHALMIC | Status: DC | PRN
  Filled 2017-11-27: qty 15

## 2017-11-27 MED ORDER — ONDANSETRON HCL 4 MG/2ML IJ SOLN: 4.0000 mg | Freq: Four times a day (QID) | INTRAMUSCULAR | Status: DC | PRN

## 2017-11-27 MED ORDER — VANCOMYCIN HCL 1000 MG IV SOLR
INTRAVENOUS | Status: AC
  Filled 2017-11-27: qty 1000

## 2017-11-27 MED ORDER — GLYCOPYRROLATE 0.2 MG/ML IJ SOLN
INTRAMUSCULAR | Status: DC | PRN
  Administered 2017-11-27: 0.6 mg via INTRAVENOUS

## 2017-11-27 MED ORDER — FENTANYL CITRATE (PF) 100 MCG/2ML IJ SOLN
25.0000 ug | INTRAMUSCULAR | Status: DC | PRN
  Administered 2017-11-27 (×2): 50 ug via INTRAVENOUS

## 2017-11-27 MED ORDER — CEFAZOLIN SODIUM-DEXTROSE 2-4 GM/100ML-% IV SOLN
INTRAVENOUS | Status: AC
  Filled 2017-11-27: qty 100

## 2017-11-27 MED ORDER — FENTANYL CITRATE (PF) 100 MCG/2ML IJ SOLN
INTRAMUSCULAR | Status: DC | PRN
  Administered 2017-11-27 (×2): 50 ug via INTRAVENOUS
  Administered 2017-11-27: 100 ug via INTRAVENOUS
  Administered 2017-11-27: 50 ug via INTRAVENOUS

## 2017-11-27 MED ORDER — DIPHENHYDRAMINE HCL 25 MG PO CAPS
50.0000 mg | ORAL_CAPSULE | Freq: Four times a day (QID) | ORAL | Status: DC | PRN
  Administered 2017-11-27: 50 mg via ORAL
  Filled 2017-11-27: qty 2

## 2017-11-27 MED ORDER — HYDROMORPHONE HCL 1 MG/ML IJ SOLN
0.5000 mg | INTRAMUSCULAR | Status: DC | PRN
  Administered 2017-11-27: 0.5 mg via INTRAVENOUS
  Filled 2017-11-27: qty 0.5

## 2017-11-27 MED ORDER — BUPIVACAINE HCL (PF) 0.25 % IJ SOLN
INTRAMUSCULAR | Status: AC
  Filled 2017-11-27: qty 30

## 2017-11-27 MED ORDER — PHENYLEPHRINE 40 MCG/ML (10ML) SYRINGE FOR IV PUSH (FOR BLOOD PRESSURE SUPPORT)
PREFILLED_SYRINGE | INTRAVENOUS | Status: AC
  Filled 2017-11-27: qty 10

## 2017-11-27 MED ORDER — TIMOLOL MALEATE 0.5 % OP SOLN
1.0000 [drp] | Freq: Every day | OPHTHALMIC | Status: DC
  Administered 2017-11-28: 1 [drp] via OPHTHALMIC
  Filled 2017-11-27: qty 5

## 2017-11-27 MED ORDER — KETOROLAC TROMETHAMINE 15.75 MG/SPRAY NA SOLN: 1.0000 | Freq: Four times a day (QID) | NASAL | Status: DC | PRN

## 2017-11-27 MED ORDER — BUPIVACAINE HCL (PF) 0.25 % IJ SOLN
INTRAMUSCULAR | Status: DC | PRN
Start: 2017-11-27 — End: 2017-11-27
  Administered 2017-11-27: 4 mL

## 2017-11-27 MED ORDER — SODIUM CHLORIDE 0.9% FLUSH: 3.0000 mL | Freq: Two times a day (BID) | INTRAVENOUS | Status: DC

## 2017-11-27 MED ORDER — POTASSIUM CHLORIDE IN NACL 20-0.9 MEQ/L-% IV SOLN: INTRAVENOUS | Status: DC

## 2017-11-27 MED ORDER — HYDROCODONE-ACETAMINOPHEN 10-325 MG PO TABS
1.0000 | ORAL_TABLET | ORAL | Status: DC | PRN
  Administered 2017-11-27 – 2017-11-28 (×2): 2 via ORAL
  Filled 2017-11-27 (×2): qty 2

## 2017-11-27 MED ORDER — GABAPENTIN 100 MG PO CAPS
200.0000 mg | ORAL_CAPSULE | Freq: Two times a day (BID) | ORAL | Status: DC
  Administered 2017-11-27 – 2017-11-28 (×2): 200 mg via ORAL
  Filled 2017-11-27 (×2): qty 2

## 2017-11-27 MED ORDER — HYDROCHLOROTHIAZIDE 12.5 MG PO CAPS
12.5000 mg | ORAL_CAPSULE | Freq: Every day | ORAL | Status: DC
  Administered 2017-11-28: 12.5 mg via ORAL
  Filled 2017-11-27 (×2): qty 1

## 2017-11-27 MED ORDER — CEFAZOLIN SODIUM-DEXTROSE 2-4 GM/100ML-% IV SOLN
2.0000 g | Freq: Three times a day (TID) | INTRAVENOUS | Status: AC
  Administered 2017-11-27 – 2017-11-28 (×2): 2 g via INTRAVENOUS
  Filled 2017-11-27 (×2): qty 100

## 2017-11-27 MED ORDER — PHENOL 1.4 % MT LIQD: 1.0000 | OROMUCOSAL | Status: DC | PRN

## 2017-11-27 MED ORDER — LIDOCAINE HCL (CARDIAC) 20 MG/ML IV SOLN
INTRAVENOUS | Status: DC | PRN
  Administered 2017-11-27: 60 mg via INTRAVENOUS

## 2017-11-27 MED ORDER — PROPOFOL 10 MG/ML IV BOLUS
INTRAVENOUS | Status: DC | PRN
  Administered 2017-11-27: 150 mg via INTRAVENOUS

## 2017-11-27 MED ORDER — ONDANSETRON HCL 4 MG/2ML IJ SOLN
INTRAMUSCULAR | Status: DC | PRN
  Administered 2017-11-27: 4 mg via INTRAVENOUS

## 2017-11-27 MED ORDER — FENTANYL CITRATE (PF) 250 MCG/5ML IJ SOLN
INTRAMUSCULAR | Status: AC
  Filled 2017-11-27: qty 5

## 2017-11-27 MED ORDER — ACETAMINOPHEN 10 MG/ML IV SOLN
INTRAVENOUS | Status: DC | PRN
  Administered 2017-11-27: 1000 mg via INTRAVENOUS

## 2017-11-27 MED ORDER — ASPIRIN EC 81 MG PO TBEC
81.0000 mg | DELAYED_RELEASE_TABLET | Freq: Every day | ORAL | Status: DC
  Administered 2017-11-27 – 2017-11-28 (×2): 81 mg via ORAL
  Filled 2017-11-27 (×4): qty 1

## 2017-11-27 MED ORDER — TAMSULOSIN HCL 0.4 MG PO CAPS
0.4000 mg | ORAL_CAPSULE | Freq: Every day | ORAL | Status: DC
  Administered 2017-11-27 – 2017-11-28 (×2): 0.4 mg via ORAL
  Filled 2017-11-27 (×2): qty 1

## 2017-11-27 MED ORDER — LACTATED RINGERS IV SOLN
INTRAVENOUS | Status: DC
  Administered 2017-11-27 (×3): via INTRAVENOUS

## 2017-11-27 MED ORDER — CEFAZOLIN SODIUM-DEXTROSE 2-4 GM/100ML-% IV SOLN: 2.0000 g | INTRAVENOUS | Status: DC

## 2017-11-27 MED ORDER — CHLORHEXIDINE GLUCONATE CLOTH 2 % EX PADS: 6.0000 | MEDICATED_PAD | Freq: Once | CUTANEOUS | Status: DC

## 2017-11-27 MED ORDER — BACITRACIN 50000 UNITS IM SOLR
INTRAMUSCULAR | Status: DC | PRN
  Administered 2017-11-27: 17:00:00

## 2017-11-27 MED ORDER — ONDANSETRON HCL 4 MG PO TABS: 4.0000 mg | ORAL_TABLET | Freq: Four times a day (QID) | ORAL | Status: DC | PRN

## 2017-11-27 MED ORDER — SENNA 8.6 MG PO TABS
1.0000 | ORAL_TABLET | Freq: Two times a day (BID) | ORAL | Status: DC
  Administered 2017-11-27 – 2017-11-28 (×2): 8.6 mg via ORAL
  Filled 2017-11-27 (×2): qty 1

## 2017-11-27 MED ORDER — MIDAZOLAM HCL 2 MG/2ML IJ SOLN
INTRAMUSCULAR | Status: AC
  Filled 2017-11-27: qty 2

## 2017-11-27 MED ORDER — NEOSTIGMINE METHYLSULFATE 10 MG/10ML IV SOLN
INTRAVENOUS | Status: DC | PRN
  Administered 2017-11-27: 4 mg via INTRAVENOUS

## 2017-11-27 MED ORDER — PHENYLEPHRINE HCL 10 MG/ML IJ SOLN
INTRAMUSCULAR | Status: DC | PRN
  Administered 2017-11-27: 120 ug via INTRAVENOUS
  Administered 2017-11-27: 80 ug via INTRAVENOUS
  Administered 2017-11-27: 120 ug via INTRAVENOUS
  Administered 2017-11-27: 80 ug via INTRAVENOUS

## 2017-11-27 MED ORDER — DEXAMETHASONE SODIUM PHOSPHATE 10 MG/ML IJ SOLN
INTRAMUSCULAR | Status: DC | PRN
  Administered 2017-11-27: 10 mg via INTRAVENOUS

## 2017-11-27 MED ORDER — CEFAZOLIN SODIUM-DEXTROSE 2-4 GM/100ML-% IV SOLN
2.0000 g | INTRAVENOUS | Status: AC
  Administered 2017-11-27: 2 g via INTRAVENOUS

## 2017-11-27 MED ORDER — METHOCARBAMOL 1000 MG/10ML IJ SOLN
500.0000 mg | Freq: Four times a day (QID) | INTRAVENOUS | Status: DC | PRN
  Filled 2017-11-27: qty 5

## 2017-11-27 MED ORDER — SODIUM CHLORIDE 0.9% FLUSH: 3.0000 mL | INTRAVENOUS | Status: DC | PRN

## 2017-11-27 MED ORDER — PROPOFOL 10 MG/ML IV BOLUS
INTRAVENOUS | Status: AC
  Filled 2017-11-27: qty 20

## 2017-11-27 MED ORDER — THROMBIN (RECOMBINANT) 5000 UNITS EX SOLR
OROMUCOSAL | Status: DC | PRN
  Administered 2017-11-27: 17:00:00 via TOPICAL

## 2017-11-27 MED ORDER — VANCOMYCIN HCL 1000 MG IV SOLR
INTRAVENOUS | Status: DC | PRN
  Administered 2017-11-27: 1000 mg via TOPICAL

## 2017-11-27 MED ORDER — ROCURONIUM BROMIDE 100 MG/10ML IV SOLN
INTRAVENOUS | Status: DC | PRN
  Administered 2017-11-27: 50 mg via INTRAVENOUS

## 2017-11-27 MED ORDER — ACETAMINOPHEN 325 MG PO TABS
650.0000 mg | ORAL_TABLET | ORAL | Status: DC | PRN
  Administered 2017-11-27: 650 mg via ORAL

## 2017-11-27 MED ORDER — ACETAMINOPHEN 10 MG/ML IV SOLN
INTRAVENOUS | Status: AC
  Filled 2017-11-27: qty 100

## 2017-11-27 MED ORDER — FENTANYL CITRATE (PF) 100 MCG/2ML IJ SOLN
INTRAMUSCULAR | Status: AC
  Filled 2017-11-27: qty 2

## 2017-11-27 MED ORDER — 0.9 % SODIUM CHLORIDE (POUR BTL) OPTIME
TOPICAL | Status: DC | PRN
  Administered 2017-11-27: 1000 mL

## 2017-11-27 MED ORDER — HYDROCODONE-ACETAMINOPHEN 10-325 MG PO TABS: 1.0000 | ORAL_TABLET | Freq: Four times a day (QID) | ORAL | Status: DC | PRN

## 2017-11-27 MED ORDER — THROMBIN 5000 UNITS EX SOLR
CUTANEOUS | Status: AC
  Filled 2017-11-27: qty 5000

## 2017-11-27 MED ORDER — DEXTROSE 5 % IV SOLN
INTRAVENOUS | Status: DC | PRN
  Administered 2017-11-27: 40 ug/min via INTRAVENOUS

## 2017-11-27 MED ORDER — CELECOXIB 200 MG PO CAPS
200.0000 mg | ORAL_CAPSULE | Freq: Two times a day (BID) | ORAL | Status: DC
  Administered 2017-11-27 – 2017-11-28 (×2): 200 mg via ORAL
  Filled 2017-11-27 (×2): qty 1

## 2017-11-27 MED ORDER — ACETAMINOPHEN 650 MG RE SUPP: 650.0000 mg | RECTAL | Status: DC | PRN

## 2017-11-27 MED ORDER — OXYBUTYNIN CHLORIDE 5 MG PO TABS
5.0000 mg | ORAL_TABLET | Freq: Every day | ORAL | Status: DC
  Administered 2017-11-27: 5 mg via ORAL
  Filled 2017-11-27: qty 1

## 2017-11-27 MED ORDER — NORTRIPTYLINE HCL 25 MG PO CAPS
25.0000 mg | ORAL_CAPSULE | Freq: Every day | ORAL | Status: DC
  Administered 2017-11-27: 25 mg via ORAL
  Filled 2017-11-27: qty 1

## 2017-11-27 MED ORDER — THROMBIN (RECOMBINANT) 20000 UNITS EX SOLR
CUTANEOUS | Status: DC | PRN
  Administered 2017-11-27: 17:00:00 via TOPICAL

## 2017-11-27 MED ORDER — KETOROLAC TROMETHAMINE 30 MG/ML IJ SOLN
INTRAMUSCULAR | Status: DC | PRN
  Administered 2017-11-27: 30 mg via INTRAVENOUS

## 2017-11-27 MED ORDER — PROMETHAZINE HCL 25 MG/ML IJ SOLN: 6.2500 mg | INTRAMUSCULAR | Status: DC | PRN

## 2017-11-27 MED ORDER — METHOCARBAMOL 500 MG PO TABS
500.0000 mg | ORAL_TABLET | Freq: Four times a day (QID) | ORAL | Status: DC | PRN
  Administered 2017-11-27 – 2017-11-28 (×3): 500 mg via ORAL
  Filled 2017-11-27 (×3): qty 1

## 2017-11-27 MED ORDER — METHOCARBAMOL 500 MG PO TABS
ORAL_TABLET | ORAL | Status: AC
  Filled 2017-11-27: qty 1

## 2017-11-27 MED ORDER — LATANOPROST 0.005 % OP SOLN
1.0000 [drp] | Freq: Every day | OPHTHALMIC | Status: DC
  Administered 2017-11-27: 1 [drp] via OPHTHALMIC
  Filled 2017-11-27: qty 2.5

## 2017-11-27 SURGICAL SUPPLY — 58 items
BAG DECANTER FOR FLEXI CONT (MISCELLANEOUS) ×3 IMPLANT
BASKET BONE COLLECTION (BASKET) ×3 IMPLANT
BENZOIN TINCTURE PRP APPL 2/3 (GAUZE/BANDAGES/DRESSINGS) ×3 IMPLANT
BLADE CLIPPER SURG (BLADE) IMPLANT
BONE CANC CHIPS 40CC CAN1/2 (Bone Implant) ×3 IMPLANT
BUR MATCHSTICK NEURO 3.0 LAGG (BURR) ×3 IMPLANT
CANISTER SUCT 3000ML PPV (MISCELLANEOUS) ×3 IMPLANT
CARTRIDGE OIL MAESTRO DRILL (MISCELLANEOUS) ×1 IMPLANT
CHIPS CANC BONE 40CC CAN1/2 (Bone Implant) ×1 IMPLANT
CLOSURE WOUND 1/2 X4 (GAUZE/BANDAGES/DRESSINGS) ×1
CONT SPEC 4OZ CLIKSEAL STRL BL (MISCELLANEOUS) ×3 IMPLANT
COVER BACK TABLE 60X90IN (DRAPES) ×3 IMPLANT
DERMABOND ADVANCED (GAUZE/BANDAGES/DRESSINGS) ×2
DERMABOND ADVANCED .7 DNX12 (GAUZE/BANDAGES/DRESSINGS) ×1 IMPLANT
DIFFUSER DRILL AIR PNEUMATIC (MISCELLANEOUS) ×3 IMPLANT
DRAPE C-ARM 42X72 X-RAY (DRAPES) ×6 IMPLANT
DRAPE LAPAROTOMY 100X72X124 (DRAPES) ×3 IMPLANT
DRAPE POUCH INSTRU U-SHP 10X18 (DRAPES) ×3 IMPLANT
DRAPE SURG 17X23 STRL (DRAPES) ×3 IMPLANT
DRSG OPSITE POSTOP 4X6 (GAUZE/BANDAGES/DRESSINGS) ×3 IMPLANT
DURAPREP 26ML APPLICATOR (WOUND CARE) ×3 IMPLANT
ELECT REM PT RETURN 9FT ADLT (ELECTROSURGICAL) ×3
ELECTRODE REM PT RTRN 9FT ADLT (ELECTROSURGICAL) ×1 IMPLANT
EVACUATOR 1/8 PVC DRAIN (DRAIN) ×3 IMPLANT
GAUZE SPONGE 4X4 16PLY XRAY LF (GAUZE/BANDAGES/DRESSINGS) IMPLANT
GLOVE BIO SURGEON STRL SZ7 (GLOVE) IMPLANT
GLOVE BIO SURGEON STRL SZ8 (GLOVE) ×6 IMPLANT
GLOVE BIOGEL PI IND STRL 7.0 (GLOVE) IMPLANT
GLOVE BIOGEL PI INDICATOR 7.0 (GLOVE)
GOWN STRL REUS W/ TWL LRG LVL3 (GOWN DISPOSABLE) IMPLANT
GOWN STRL REUS W/ TWL XL LVL3 (GOWN DISPOSABLE) ×2 IMPLANT
GOWN STRL REUS W/TWL 2XL LVL3 (GOWN DISPOSABLE) IMPLANT
GOWN STRL REUS W/TWL LRG LVL3 (GOWN DISPOSABLE)
GOWN STRL REUS W/TWL XL LVL3 (GOWN DISPOSABLE) ×4
HEMOSTAT POWDER KIT SURGIFOAM (HEMOSTASIS) IMPLANT
KIT BASIN OR (CUSTOM PROCEDURE TRAY) ×3 IMPLANT
KIT ROOM TURNOVER OR (KITS) ×3 IMPLANT
MILL MEDIUM DISP (BLADE) ×3 IMPLANT
NEEDLE HYPO 25X1 1.5 SAFETY (NEEDLE) ×3 IMPLANT
NS IRRIG 1000ML POUR BTL (IV SOLUTION) ×3 IMPLANT
OIL CARTRIDGE MAESTRO DRILL (MISCELLANEOUS) ×3
PACK LAMINECTOMY NEURO (CUSTOM PROCEDURE TRAY) ×3 IMPLANT
PAD ARMBOARD 7.5X6 YLW CONV (MISCELLANEOUS) ×9 IMPLANT
ROD PC 5.5X35 TI ARSENAL (Rod) ×6 IMPLANT
SCREW CBX 5.5X35MM (Screw) ×12 IMPLANT
SCREW SET SPINAL ARSENAL 47127 (Screw) ×12 IMPLANT
SPONGE LAP 4X18 X RAY DECT (DISPOSABLE) IMPLANT
SPONGE SURGIFOAM ABS GEL 100 (HEMOSTASIS) ×3 IMPLANT
STRIP BIOACTIVE 10CC 25X50X8 (Miscellaneous) ×3 IMPLANT
STRIP CLOSURE SKIN 1/2X4 (GAUZE/BANDAGES/DRESSINGS) ×2 IMPLANT
SUT VIC AB 0 CT1 18XCR BRD8 (SUTURE) ×1 IMPLANT
SUT VIC AB 0 CT1 8-18 (SUTURE) ×2
SUT VIC AB 2-0 CP2 18 (SUTURE) ×3 IMPLANT
SUT VIC AB 3-0 SH 8-18 (SUTURE) ×6 IMPLANT
TOWEL GREEN STERILE (TOWEL DISPOSABLE) ×3 IMPLANT
TOWEL GREEN STERILE FF (TOWEL DISPOSABLE) ×3 IMPLANT
TRAY FOLEY W/METER SILVER 16FR (SET/KITS/TRAYS/PACK) ×3 IMPLANT
WATER STERILE IRR 1000ML POUR (IV SOLUTION) ×3 IMPLANT

## 2017-11-27 NOTE — H&P (Signed)
Subjective: Patient is a 57 y.o. male admitted for lumbar fusion. Onset of symptoms was several months ago, gradually worsening since that time.  The pain is rated severe, and is located at the across the lower back and radiates to RLE. The pain is described as aching and occurs all day. The symptoms have been progressive. Symptoms are exacerbated by exercise. MRI or CT showed spondylolisthesis stenosis L4-5   Past Medical History:  Diagnosis Date  . Acute prostatitis 05/23/2009  . Adjustment disorder with depressed mood 09/2007  . CERVICAL RADICULOPATHY, LEFT 08/30/2008  . DJD (degenerative joint disease) of hip   . DJD (degenerative joint disease) of knee    left knee  . GERD (gastroesophageal reflux disease)   . 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 H/O: substance abuse    hx of ETOH/Crack cocaine-none since 11/08 per pt/Does not Drive due to this  . HYPERTENSION 08/30/2008  . HYPERTHYROIDISM 02/13/2010   pt was told by  Dr Jenny Reichmann  that thyroid was now back to normal .. 2012 ...   . HYPOTENSION 10/31/2009  . 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 Other and unspecified hyperlipidemia 08/20/2013  . PONV (postoperative nausea and vomiting)   . Seasonal allergies   . SPINAL STENOSIS, CERVICAL 08/08/2009  . THYROID NODULE, RIGHT 03/02/2010    Past Surgical History:  Procedure Laterality Date  . ANTERIOR CERVICAL DECOMP/DISCECTOMY FUSION  10/11/2011   Procedure: ANTERIOR CERVICAL DECOMPRESSION/DISCECTOMY FUSION 3 LEVELS;  Surgeon: Eustace Moore;  Location: [place] [facility];  Service: Neurosurgery;  Laterality: N/A;  Cervical three-four ,cervical four five cervical five six Anterior Cervical Decompression Fusion with peek + plate Nuvasive translational plate Orthofix peek (2 1/2 hours) Rm # [id number]  . ANTERIOR CERVICAL DECOMP/DISCECTOMY FUSION N/A 07/29/2014   Procedure: ANTERIOR CERVICAL  DECOMPRESSION/DISCECTOMY FUSION 1 LEVEL/HARDWARE REMOVAL;  Surgeon: Eustace Moore, MD;  Location: [place] [facility]UR[facility] ORS;  Service: Neurosurgery;  Laterality: N/A;  cervical six-seven  . POSTERIOR CERVICAL FUSION/FORAMINOTOMY N/A 09/23/2013   Procedure: CERVICALTWO TO CERVICAL SEVEN POSTERIOR CERVICAL FUSION/FORAMINOTOMY WITH LATERAL MASS FIXATION;  Surgeon: Eustace Moore, MD;  Location: [place] [facility];  Service: Neurosurgery;  Laterality: N/A;  . Right Hand I&D  12/07   s/p rdue to abscess-Dr. Amedeo Plenty  . TOTAL HIP ARTHROPLASTY  12/17/2011   Procedure: TOTAL HIP ARTHROPLASTY;  Surgeon: Johnny Bridge, MD;  Location: [place];  Service: Orthopedics;  Laterality: Left;    Prior to Admission medications   Medication Sig Start Date End Date Taking? Authorizing Provider  acetaminophen (TYLENOL) 500 MG tablet Take 1,000 mg by mouth every 6 (six) hours as needed for moderate pain or headache.   Yes [provider]  aspirin 81 MG tablet Take 1 tablet (81 mg total) by mouth daily. 08/08/14  Yes Consuella Lose, MD  bimatoprost (LUMIGAN) 0.01 % SOLN Place 1 drop into both eyes at bedtime.   Yes [provider]  carboxymethylcellulose (REFRESH PLUS) 0.5 % SOLN Place 1 drop into both eyes 3 (three) times daily as needed (for clear eyes).    Yes [provider]  diphenhydrAMINE (BENADRYL) 25 mg capsule Take 50 mg by mouth every 6 (six) hours as needed for itching.    Yes [provider]  fluticasone (FLONASE) 50 MCG/ACT nasal spray Place 2 sprays daily into both nostrils. Patient taking differently: Place 1  spray into both nostrils daily as needed for allergies.  08/21/17  Yes Biagio Borg, MD  gabapentin (NEURONTIN) 100 MG capsule Take 200 mg by mouth 2 (two) times daily.   Yes [provider]  hydrochlorothiazide (MICROZIDE) 12.5 MG capsule Take 1 capsule (12.5 mg total) daily by mouth. 08/21/17  Yes Biagio Borg, MD  HYDROcodone-acetaminophen [facility]) 10-325 MG per tablet Take  1 tablet by mouth every 6 (six) hours as needed for moderate pain. 07/30/14  Yes Consuella Lose, MD  Ketorolac Tromethamine (SPRIX) 15.75 MG/SPRAY SOLN Place 1 spray into the nose every 6 (six) hours as needed (for pain).   Yes [provider]  nortriptyline (PAMELOR) 25 MG capsule Take 25 mg by mouth at bedtime. 10/29/17  Yes [provider]  oxybutynin (DITROPAN) 5 MG tablet Take 5 mg by mouth at bedtime. 10/28/17  Yes [provider]  pantoprazole (PROTONIX) 40 MG tablet Take 1 tablet (40 mg total) daily by mouth. Patient taking differently: Take 40 mg by mouth 2 (two) times daily.  08/21/17  Yes Biagio Borg, MD  ranitidine (ZANTAC) 150 MG tablet Take 1 tablet (150 mg total) 2 (two) times daily by mouth. 09/01/17  Yes Biagio Borg, MD  tamsulosin (FLOMAX) 0.4 MG CAPS capsule TAKE 1 CAPSULE (0.4 MG TOTAL) BY MOUTH twice daily 08/21/17  Yes Biagio Borg, MD  timolol (TIMOPTIC) 0.5 % ophthalmic solution Place 1 drop into both eyes daily. 10/28/17  Yes [provider]  tiZANidine (ZANAFLEX) 4 MG capsule TAKE 1 CAPSULE BY ORAL ROUTE 3 TIMES EVERY DAY Patient taking differently: Take 4 mg by mouth 2 (two) times daily.  08/21/17  Yes Biagio Borg, MD  tretinoin (RETIN-A) 0.1 % cream APPLY TO AFFECTED AREA AT BEDTIME TO FACE 08/21/17  Yes Biagio Borg, MD  zolpidem (AMBIEN) 10 MG tablet Take 1 tablet (10 mg total) at bedtime as needed by mouth. for sleep Patient taking differently: Take 10 mg by mouth at bedtime.  08/21/17  Yes Biagio Borg, MD  adapalene (DIFFERIN) 0.1 % gel Apply 1 application at bedtime topically. 08/21/17   Biagio Borg, MD  benzoyl peroxide (BENZOYL PEROXIDE) 5 % external liquid Apply 2 (two) times daily topically. Patient taking differently: Apply 1 application topically 2 (two) times daily.  08/21/17   Biagio Borg, MD  ondansetron (ZOFRAN) 4 MG tablet TAKE 1 TABLET (4 MG TOTAL) BY MOUTH EVERY 8 (EIGHT) HOURS AS NEEDED FOR NAUSEA OR VOMITING.  04/12/16   Biagio Borg, MD   Allergies  Allergen Reactions  . Hydrocodone Itching and Other (See Comments)    Pt can tolerate  . Tramadol Itching  . Morphine And Related Itching  . Oxycodone Itching    Social History   Tobacco Use  . Smoking status: Current Every Day Smoker    Packs/day: 0.50    Years: 40.00    Pack years: 20.00    Types: Cigarettes  . Smokeless tobacco: Never Used  . Tobacco comment: last crack over 1 yr   alcohol last time few yrs. States today is his quit date for smoking   Substance Use Topics  . Alcohol use: No    Alcohol/week: 0.0 oz    Comment: hx of alcohol abuse    Family History  Problem Relation Age of Onset  . Alcohol abuse Father   . Stroke Father   . Heart disease Father   . Arthritis Mother   . Hyperlipidemia Mother   .  Goiter Mother        resection of benign goiter  . Hypertension Sister   . Diabetes Sister   . Goiter Sister        resection of benign goiter  . Diabetes Brother   . Alcohol abuse Brother   . Alcohol abuse Cousin   . Heart disease Other        Aunt  . Colon cancer Neg Hx      Review of Systems  Positive ROS: neg  All other systems have been reviewed and were otherwise negative with the exception of those mentioned in the HPI and as above.  Objective: Vital signs in last 24 hours: Temp:  [97.6 F (36.4 C)] 97.6 F (36.4 C) (02/14 1143) Pulse Rate:  [77] 77 (02/14 1143) Resp:  [20] 20 (02/14 1143) BP: (122)/(84) 122/84 (02/14 1143) SpO2:  [100 %] 100 % (02/14 1143) Weight:  [73.9 kg (163 lb)] 73.9 kg (163 lb) (02/14 1143)  General Appearance: Alert, cooperative, no distress, appears stated age Head: Normocephalic, without obvious abnormality, atraumatic Eyes: PERRL, conjunctiva/corneas clear, EOM's intact    Neck: Supple, symmetrical, trachea midline Back: Symmetric, no curvature, ROM normal, no CVA tenderness Lungs:  respirations unlabored Heart: Regular rate and rhythm Abdomen: Soft,  non-tender Extremities: Extremities normal, atraumatic, no cyanosis or edema Pulses: 2+ and symmetric all extremities Skin: Skin color, texture, turgor normal, no rashes or lesions  NEUROLOGIC:   Mental status: Alert and oriented x4,  no aphasia, good attention span, fund of knowledge, and memory Motor Exam - grossly normal Sensory Exam - grossly normal Reflexes: 1+ Coordination - grossly normal Gait - grossly normal Balance - grossly normal Cranial Nerves: I: smell Not tested  II: visual acuity  OS: nl    OD: nl  II: visual fields Full to confrontation  II: pupils Equal, round, reactive to light  III,VII: ptosis None  III,IV,VI: extraocular muscles  Full ROM  V: mastication Normal  V: facial light touch sensation  Normal  V,VII: corneal reflex  Present  VII: facial muscle function - upper  Normal  VII: facial muscle function - lower Normal  VIII: hearing Not tested  IX: soft palate elevation  Normal  IX,X: gag reflex Present  XI: trapezius strength  5/5  XI: sternocleidomastoid strength 5/5  XI: neck flexion strength  5/5  XII: tongue strength  Normal    Data Review Lab Results  Component Value Date   WBC 9.8 11/21/2017   HGB 11.6 (L) 11/21/2017   HCT 34.9 (L) 11/21/2017   MCV 89.7 11/21/2017   PLT 423 (H) 11/21/2017   Lab Results  Component Value Date   NA 133 (L) 11/21/2017   K 3.4 (L) 11/21/2017   CL 96 (L) 11/21/2017   CO2 24 11/21/2017   BUN 10 11/21/2017   CREATININE 1.16 11/21/2017   GLUCOSE 103 (H) 11/21/2017   Lab Results  Component Value Date   INR 0.96 07/22/2014    Assessment/Plan:  Estimated body mass index is 21.51 kg/m as calculated from the following:   Height as of this encounter: 6\' 1"  (1.854 m).   Weight as of this encounter: 73.9 kg (163 lb). Patient admitted for LL/ fusion L4-5. Patient has failed a reasonable attempt at conservative therapy.  I explained the condition and procedure to the patient and answered any questions.   Patient wishes to proceed with procedure as planned. Understands risks/ benefits and typical outcomes of procedure.   JONES,DAVID S 11/27/2017 1:51 PM

## 2017-11-27 NOTE — Anesthesia Postprocedure Evaluation (Signed)
Anesthesia Post Note  Patient: Mark Dunlap  Procedure(s) Performed: Lumbar Four-Five Laminectomy/Foraminotomy with posterolateral fusion, instrumentation (N/A Spine Lumbar)     Patient location during evaluation: PACU Anesthesia Type: General Level of consciousness: awake and alert Pain management: pain level controlled Vital Signs Assessment: post-procedure vital signs reviewed and stable Respiratory status: spontaneous breathing, nonlabored ventilation and respiratory function stable Cardiovascular status: blood pressure returned to baseline and stable Postop Assessment: no apparent nausea or vomiting Anesthetic complications: no    Last Vitals:  Vitals:   11/27/17 1815 11/27/17 1840  BP: (!) 150/90 (!) 176/95  Pulse: 66 60  Resp: 15 16  Temp: 36.6 C 36.6 C  SpO2: 99% 99%                  Audry Pili

## 2017-11-27 NOTE — Anesthesia Preprocedure Evaluation (Addendum)
Anesthesia Evaluation  Patient identified by MRN, date of birth, ID band Patient awake    Reviewed: Allergy & Precautions, NPO status , Patient's Chart, lab work & pertinent test results  History of Anesthesia Complications (+) PONV  Airway Mallampati: I  TM Distance: >3 FB Neck ROM: Full    Dental  (+) Edentulous Upper, Edentulous Lower   Pulmonary Current Smoker,    Pulmonary exam normal breath sounds clear to auscultation       Cardiovascular hypertension, Pt. on medications Normal cardiovascular exam Rhythm:Regular Rate:Normal  EKG - LAFB, LVH   Neuro/Psych PSYCHIATRIC DISORDERS Adjustment d/o   GI/Hepatic GERD  Medicated and Controlled,(+)     substance abuse  , Hx EtOH and crack cocaine use - denies since 2008   Endo/Other  Hx hyperthyroidism 2012 now resolved w/o medication  Renal/GU      Musculoskeletal  (+) Arthritis ,   Abdominal   Peds  Hematology  (+) anemia ,   Anesthesia Other Findings Legally blind left eye  Reproductive/Obstetrics                            Anesthesia Physical  Anesthesia Plan  ASA: III  Anesthesia Plan: General   Post-op Pain Management:    Induction: Intravenous  PONV Risk Score and Plan: 3 and Ondansetron, Treatment may vary due to age or medical condition, Dexamethasone and Midazolam  Airway Management Planned: Oral ETT  Additional Equipment: None  Intra-op Plan:   Post-operative Plan: Extubation in OR  Informed Consent: I have reviewed the patients History and Physical, chart, labs and discussed the procedure including the risks, benefits and alternatives for the proposed anesthesia with the patient or authorized representative who has indicated his/her understanding and acceptance.   Dental advisory given  Plan Discussed with: CRNA  Anesthesia Plan Comments:         Anesthesia Quick Evaluation

## 2017-11-27 NOTE — Op Note (Signed)
11/27/2017  5:24 PM  PATIENT:  Mark Dunlap  57 y.o. male  PRE-OPERATIVE DIAGNOSIS:  Spondylo-Listhesis L4-L5 with severe spinal stenosis, back and leg pain  POST-OPERATIVE DIAGNOSIS:  same  PROCEDURE:   1. Decompressive lumbar laminectomy L4-5 in order to adequately decompress the neural elements and address the spinal stenosis 2. Posterior fixation L4-5 using Alphatec cortical pedicle screws.  4. Intertransverse arthrodesis L4-5 using morcellized autograft and allograft soaked with bone marrow aspirate.  SURGEON:  Sherley Bounds, MD  ASSISTANTS: Dr. Vertell Limber  ANESTHESIA:  General  EBL: 100 ml  Total I/O In: 1000 [I.V.:1000] Out: 250 [Urine:150; Blood:100]  BLOOD ADMINISTERED:none  DRAINS: none   INDICATION FOR PROCEDURE: This patient presented with back and leg pain. Imaging revealed spondylolisthesis with stenosis L4-5. The patient tried a reasonable attempt at conservative medical measures without relief. I recommended decompression and instrumented fusion to address the stenosis as well as the segmental  instability.  Patient understood the risks, benefits, and alternatives and potential outcomes and wished to proceed.  PROCEDURE DETAILS:  The patient was brought to the operating room. After induction of generalized endotracheal anesthesia the patient was rolled into the prone position on chest rolls and all pressure points were padded. The patient's lumbar region was cleaned and then prepped with DuraPrep and draped in the usual sterile fashion. Anesthesia was injected and then a dorsal midline incision was made and carried down to the lumbosacral fascia. The fascia was opened and the paraspinous musculature was taken down in a subperiosteal fashion to expose L4-5. A self-retaining retractor was placed. Intraoperative fluoroscopy confirmed my level, and I started with placement of the L4 cortical pedicle screws. The pedicle screw entry zones were identified utilizing surface  landmarks and  AP and lateral fluoroscopy. I scored the cortex with the high-speed drill and then used the hand drill to drill an upward and outward direction into the pedicle. I then tapped line to line. I then placed a 5.5 x 35 mm cortical pedicle screw into the pedicles of L4 bilaterally. I then turned my attention to the decompression and complete lumbar laminectomies, hemi- facetectomies, and foraminotomies were performed at L4-5. The patient had significant spinal stenosis.  Much more generous decompression and generous foraminotomy and even hemi-facetectomy was undertaken in order to adequately decompress the neural elements and address the patient's leg pain. The yellow ligament was removed to expose the underlying dura and nerve roots, and generous foraminotomies were performed to adequately decompress the neural elements. Both the exiting and traversing nerve roots were decompressed on both sides until a coronary dilator passed easily along the nerve roots. Once the decompression was complete, I turned my attention to the placement of the lower pedicle screws.  The pedicle screw entry zones were identified utilizing surface landmarks and fluoroscopy. I drilled into each pedicle utilizing the hand drill, and tapped each pedicle with the appropriate tap. We palpated with a ball probe to assure no break in the cortex. We then placed 5.5 x 35 mm cortical pedicle screws into the pedicles bilaterally at L5. We then decorticated the transverse processes and laid a mixture of morcellized autograft and allograft out over these to perform intertransverse arthrodesis at L4-5. We then placed lordotic rods into the multiaxial screw heads of the pedicle screws and locked these in position with the locking caps and anti-torque device. We then checked our construct with AP and lateral fluoroscopy. Irrigated with copious amounts of bacitracin-containing saline solution. Inspected the nerve roots once again to  assure  adequate decompression, lined to the dura with Gelfoam, placed powdered vancomycin into the wound, and closed the muscle and the fascia with 0 Vicryl. Closed the subcutaneous tissues with 2-0 Vicryl and subcuticular tissues with 3-0 Vicryl. The skin was closed with benzoin and Steri-Strips. Dressing was then applied, the patient was awakened from general anesthesia and transported to the recovery room in stable condition. At the end of the procedure all sponge, needle and instrument counts were correct.   PLAN OF CARE: admit to inpatient  PATIENT DISPOSITION:  PACU - hemodynamically stable.   Delay start of Pharmacological VTE agent (>24hrs) due to surgical blood loss or risk of bleeding:  yes

## 2017-11-27 NOTE — Transfer of Care (Signed)
Immediate Anesthesia Transfer of Care Note  Patient: Mark Dunlap  Procedure(s) Performed: Lumbar Four-Five Laminectomy/Foraminotomy with posterolateral fusion, instrumentation (N/A Spine Lumbar)  Patient Location: PACU  Anesthesia Type:General  Level of Consciousness: awake, alert , oriented and patient cooperative  Airway & Oxygen Therapy: Patient Spontanous Breathing and Patient connected to nasal cannula oxygen  Post-op Assessment: Report given to RN and Post -op Vital signs reviewed and stable  Post vital signs: Reviewed and stable  Last Vitals:  Vitals:   11/27/17 1143  BP: 122/84  Pulse: 77  Resp: 20  Temp: 36.4 C  SpO2: 100%    Last Pain:  Vitals:   11/27/17 1214  TempSrc:   PainSc: 0-No pain      Patients Stated Pain Goal: 3 (58/68/25 [id number])  Complications: No apparent anesthesia complications

## 2017-11-28 ENCOUNTER — Other Ambulatory Visit: Payer: Self-pay

## 2017-11-28 MED ORDER — HYDROMORPHONE HCL 2 MG PO TABS
2.0000 mg | ORAL_TABLET | ORAL | Status: DC | PRN
  Administered 2017-11-28 (×2): 2 mg via ORAL
  Filled 2017-11-28 (×2): qty 1

## 2017-11-28 MED ORDER — HYDROMORPHONE HCL 2 MG PO TABS: 2.0000 mg | ORAL_TABLET | Freq: Four times a day (QID) | ORAL | 0 refills | Status: DC | PRN

## 2017-11-28 MED ORDER — TIZANIDINE HCL 4 MG PO CAPS: ORAL_CAPSULE | ORAL | 2 refills | Status: DC

## 2017-11-28 MED FILL — Thrombin For Soln 5000 Unit: CUTANEOUS | Qty: 5000 | Status: AC

## 2017-11-28 NOTE — Discharge Summary (Signed)
Physician Discharge Summary  Patient ID: Mark Dunlap MRN: [id number] DOB/AGE: [birth year] 57 y.o.  Admit date: 11/27/2017 Discharge date: 11/28/2017  Admission Diagnoses: spondylolisthesis/ stenosis l4-5    Discharge Diagnoses: same   Discharged Condition: good  Hospital Course: The patient was admitted on 11/27/2017 and taken to the operating room where the patient underwent lumbar fusion L4-5. The patient tolerated the procedure well and was taken to the recovery room and then to the floor in stable condition. The hospital course was routine. There were no complications. The wound remained clean dry and intact. Pt had appropriate back soreness. No complaints of leg pain or new N/T/W. The patient remained afebrile with stable vital signs, and tolerated a regular diet. The patient continued to increase activities, and pain was well controlled with oral pain medications.   Consults: None  Significant Diagnostic Studies:  Results for orders placed or performed during the hospital encounter of 11/21/17  Surgical pcr screen  Result Value Ref Range   MRSA, PCR NEGATIVE NEGATIVE   Staphylococcus aureus NEGATIVE NEGATIVE  Basic metabolic panel  Result Value Ref Range   Sodium 133 (L) 135 - 145 mmol/L   Potassium 3.4 (L) 3.5 - 5.1 mmol/L   Chloride 96 (L) 101 - 111 mmol/L   CO2 24 22 - 32 mmol/L   Glucose, Bld 103 (H) 65 - 99 mg/dL   BUN 10 6 - 20 mg/dL   Creatinine, Ser 1.16 0.61 - 1.24 mg/dL   Calcium 8.7 (L) 8.9 - 10.3 mg/dL   GFR calc non Af Amer >60 >60 mL/min   GFR calc Af Amer >60 >60 mL/min   Anion gap 13 5 - 15  CBC  Result Value Ref Range   WBC 9.8 4.0 - 10.5 K/uL   RBC 3.89 (L) 4.22 - 5.81 MIL/uL   Hemoglobin 11.6 (L) 13.0 - 17.0 g/dL   HCT 34.9 (L) 39.0 - 52.0 %   MCV 89.7 78.0 - 100.0 fL   MCH 29.8 26.0 - 34.0 pg   MCHC 33.2 30.0 - 36.0 g/dL   RDW 14.3 11.5 - 15.5 %   Platelets 423 (H) 150 - 400 K/uL  Type and screen [place]  Result  Value Ref Range   ABO/RH(D) B POS    Antibody Screen NEG    Sample Expiration 12/05/2017    Extend sample reason      NO TRANSFUSIONS OR PREGNANCY IN THE PAST 3 MONTHS Performed at [facility], [address] [place], [place] [address]     Dg Lumbar Spine 2-3 Views  Result Date: 11/27/2017 CLINICAL DATA:  Lumbar fusion. EXAM: LUMBAR SPINE - 2-3 VIEW; DG C-ARM 61-120 MIN COMPARISON:  Lumbar spine MRI of 10/16/2017 FLUOROSCOPY TIME:  C-arm fluoroscopic images were obtained intraoperatively and submitted for post operative interpretation. Please see the performing provider's procedural report for the fluoroscopy time utilized. FINDINGS: Two intraoperative fluoroscopic images of the lumbar spine are provided. Bilateral pedicle screws and interconnecting rods have been placed at L4-5. IMPRESSION: Intraoperative images during L4-5 fusion. Electronically Signed   By: Logan Bores M.D.   On: 11/27/2017 17:35   Dg C-arm 1-60 Min  Result Date: 11/27/2017 CLINICAL DATA:  Lumbar fusion. EXAM: LUMBAR SPINE - 2-3 VIEW; DG C-ARM 61-120 MIN COMPARISON:  Lumbar spine MRI of 10/16/2017 FLUOROSCOPY TIME:  C-arm fluoroscopic images were obtained intraoperatively and submitted for post operative interpretation. Please see the performing provider's procedural report for the fluoroscopy time utilized. FINDINGS: Two  intraoperative fluoroscopic images of the lumbar spine are provided. Bilateral pedicle screws and interconnecting rods have been placed at L4-5. IMPRESSION: Intraoperative images during L4-5 fusion. Electronically Signed   By: Logan Bores M.D.   On: 11/27/2017 17:35    Antibiotics:  Anti-infectives (From admission, onward)   Start     Dose/Rate Route Frequency Ordered Stop   11/28/17 0600  ceFAZolin (ANCEF) IVPB 2g/100 mL premix  Status:  Discontinued     2 g 200 mL/hr over 30 Minutes Intravenous On call to O.R. 11/27/17 1453 11/27/17 1454   11/27/17 1845  ceFAZolin (ANCEF) IVPB 2g/100 mL  premix     2 g 200 mL/hr over 30 Minutes Intravenous Every 8 hours 11/27/17 1841 11/28/17 0242   11/27/17 1655  vancomycin (VANCOCIN) powder  Status:  Discontinued       As needed 11/27/17 1655 11/27/17 1717   11/27/17 1641  bacitracin 50,000 Units in sodium chloride irrigation 0.9 % 500 mL irrigation  Status:  Discontinued       As needed 11/27/17 1641 11/27/17 1717   11/27/17 1500  ceFAZolin (ANCEF) IVPB 2g/100 mL premix     2 g 200 mL/hr over 30 Minutes Intravenous On call to O.R. 11/27/17 1454 11/27/17 1506   11/27/17 1434  ceFAZolin (ANCEF) 2-4 GM/100ML-% IVPB    Comments:  [employer]   : cabinet override      11/27/17 1434 11/27/17 1506      Discharge Exam: Blood pressure (!) 144/89, pulse (!) 106, temperature 99.1 F (37.3 C), temperature source Oral, resp. rate 16, height 6\' 1"  (1.854 m), weight 73.9 kg (163 lb), SpO2 100 %. Neurologic: Grossly normal Incision CDI  Discharge Medications:   Allergies as of 11/28/2017      Reactions   Hydrocodone Itching, Other (See Comments)   Pt can tolerate   Tramadol Itching   Morphine And Related Itching   Oxycodone Itching      Medication List    STOP taking these medications   gabapentin 100 MG capsule Commonly known as:  NEURONTIN     TAKE these medications   acetaminophen 500 MG tablet Commonly known as:  TYLENOL Take 1,000 mg by mouth every 6 (six) hours as needed for moderate pain or headache.   adapalene 0.1 % gel Commonly known as:  DIFFERIN Apply 1 application at bedtime topically.   aspirin 81 MG tablet Take 1 tablet (81 mg total) by mouth daily.   benzoyl peroxide 5 % external liquid Commonly known as:  benzoyl peroxide Apply 2 (two) times daily topically. What changed:  how much to take   bimatoprost 0.01 % Soln Commonly known as:  LUMIGAN Place 1 drop into both eyes at bedtime.   carboxymethylcellulose 0.5 % Soln Commonly known as:  REFRESH PLUS Place 1 drop into both eyes 3 (three) times daily  as needed (for clear eyes).   diphenhydrAMINE 25 mg capsule Commonly known as:  BENADRYL Take 50 mg by mouth every 6 (six) hours as needed for itching.   fluticasone 50 MCG/ACT nasal spray Commonly known as:  FLONASE Place 2 sprays daily into both nostrils. What changed:    how much to take  when to take this  reasons to take this   hydrochlorothiazide 12.5 MG capsule Commonly known as:  MICROZIDE Take 1 capsule (12.5 mg total) daily by mouth.   HYDROcodone-acetaminophen 10-325 MG tablet Commonly known as:  NORCO Take 1 tablet by mouth every 6 (six) hours as needed for  moderate pain.   HYDROmorphone 2 MG tablet Commonly known as:  DILAUDID Take 1 tablet (2 mg total) by mouth every 6 (six) hours as needed for severe pain.   nortriptyline 25 MG capsule Commonly known as:  PAMELOR Take 25 mg by mouth at bedtime.   ondansetron 4 MG tablet Commonly known as:  ZOFRAN TAKE 1 TABLET (4 MG TOTAL) BY MOUTH EVERY 8 (EIGHT) HOURS AS NEEDED FOR NAUSEA OR VOMITING.   oxybutynin 5 MG tablet Commonly known as:  DITROPAN Take 5 mg by mouth at bedtime.   pantoprazole 40 MG tablet Commonly known as:  PROTONIX Take 1 tablet (40 mg total) daily by mouth. What changed:  when to take this   ranitidine 150 MG tablet Commonly known as:  ZANTAC Take 1 tablet (150 mg total) 2 (two) times daily by mouth.   SPRIX 15.75 MG/SPRAY Soln Generic drug:  Ketorolac Tromethamine Place 1 spray into the nose every 6 (six) hours as needed (for pain).   tamsulosin 0.4 MG Caps capsule Commonly known as:  FLOMAX TAKE 1 CAPSULE (0.4 MG TOTAL) BY MOUTH twice daily   timolol 0.5 % ophthalmic solution Commonly known as:  TIMOPTIC Place 1 drop into both eyes daily.   tiZANidine 4 MG capsule Commonly known as:  ZANAFLEX TAKE 1 CAPSULE BY ORAL ROUTE 3 TIMES EVERY DAY What changed:    how much to take  how to take this  when to take this  additional instructions   tretinoin 0.1 %  cream Commonly known as:  RETIN-A APPLY TO AFFECTED AREA AT BEDTIME TO FACE   zolpidem 10 MG tablet Commonly known as:  AMBIEN Take 1 tablet (10 mg total) at bedtime as needed by mouth. for sleep What changed:    when to take this  additional instructions            Durable Medical Equipment  (From admission, onward)        Start     Ordered   11/27/17 1842  DME Walker rolling  Once    Question:  Patient needs a walker to treat with the following condition  Answer:  S/P lumbar fusion   11/27/17 1841   11/27/17 1842  DME 3 n 1  Once     11/27/17 1841      Disposition: home   Final Dx: lumbar fusion L4-5  Discharge Instructions     Remove dressing in 72 hours   Complete by:  As directed    Call MD for:  difficulty breathing, headache or visual disturbances   Complete by:  As directed    Call MD for:  persistant nausea and vomiting   Complete by:  As directed    Call MD for:  redness, tenderness, or signs of infection (pain, swelling, redness, odor or green/yellow discharge around incision site)   Complete by:  As directed    Call MD for:  severe uncontrolled pain   Complete by:  As directed    Call MD for:  temperature >100.4   Complete by:  As directed    Diet - low sodium heart healthy   Complete by:  As directed    Increase activity slowly   Complete by:  As directed          Signed: Rayane Gallardo S 11/28/2017, 7:39 AM

## 2017-11-28 NOTE — Evaluation (Signed)
Physical Therapy Evaluation Patient Details Name: Mark Dunlap MRN: [id number] DOB: Dec 08, 1960 Today's Date: 11/28/2017   History of Present Illness  Pt is a 57 y/o male who presents s/p L4-L5 PLIF on 11/27/17. 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  Clinical Impression  Pt admitted with above diagnosis. Pt currently with functional limitations due to the deficits listed below (see PT Problem List). At the time of PT eval pt was able to perform transfers and ambulation with gross min guard assist to supervision for safety with RW. Pt overall moving slowly due to pain, and requires significant increased time for bed mobility. 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 Pt was educated on appropriate and safe activity progression and maintenance of back precautions during mobility. Pt will benefit from skilled PT to increase their independence and safety with mobility to allow discharge to the venue listed below.       Follow Up Recommendations No PT follow up;Supervision for mobility/OOB    Equipment Recommendations  Rolling walker with 5" wheels    Recommendations for Other Services       Precautions / Restrictions Precautions Precautions: Fall;Back Precaution Comments: Reviewed precautions verbally, and pt was cued for precautions during functional mobility Required Braces or Orthoses: (No brace needed order) Restrictions Weight Bearing Restrictions: No      Mobility  Bed Mobility Overal bed mobility: Needs Assistance Bed Mobility: Rolling;Sidelying to Sit;Sit to Sidelying Rolling: Supervision Sidelying to sit: Supervision     Sit to sidelying: Min guard General bed mobility comments: Increased time required due to pain. Pt required hands on guarding for LE elevation up into  bed.   Transfers Overall transfer level: Needs assistance Equipment used: Rolling walker (2 wheeled) Transfers: Sit to/from Stand Sit to Stand: Supervision         General transfer comment: VC's for hand placement on seated surface for safety. Pt was able to power up to full stand with supervision for safety and cues for improved posture.   Ambulation/Gait Ambulation/Gait assistance: Min guard;Supervision Ambulation Distance (Feet): 250 Feet Assistive device: Rolling walker (2 wheeled) Gait Pattern/deviations: Step-through pattern;Decreased stride length;Trunk flexed Gait velocity: Decreased Gait velocity interpretation: Below normal speed for age/gender General Gait Details: Initially with min guard progressing to supervision for safety. Pt ambulating slowly with grossly flexed posture, but was able to make corrective changes with VC's.  Stairs            Wheelchair Mobility    Modified Rankin (Stroke Patients Only)       Balance Overall balance assessment: Needs assistance Sitting-balance support: Feet supported;No upper extremity supported Sitting balance-Leahy Scale: Fair     Standing balance support: No upper extremity supported;During functional activity Standing balance-Leahy Scale: Fair                               Pertinent Vitals/Pain Pain Assessment: Faces Faces Pain Scale: Hurts whole lot Pain Location: Back - pain worse at end of session Pain Descriptors / Indicators: Operative site guarding;Discomfort Pain Intervention(s): Limited activity within patient's tolerance;Monitored during session;Repositioned    Home Living Family/patient expects to be discharged to:: Private residence Living Arrangements: Parent Available Help at Discharge: Family;Available 24 hours/day Type of Home: House  Home Access: Level entry     Home Layout: One level Home Equipment: Shower seat - built in      Prior Function Level of Independence:  Independent         Comments: Works full time for [employer]        Extremity/Trunk Assessment   Upper Extremity Assessment Upper Extremity Assessment: Defer to OT evaluation    Lower Extremity Assessment Lower Extremity Assessment: Generalized weakness(Consistent with pre-op diagnosis)    Cervical / Trunk Assessment Cervical / Trunk Assessment: Other exceptions Cervical / Trunk Exceptions: s/p surgery  Communication   Communication: No difficulties  Cognition Arousal/Alertness: Awake/alert Behavior During Therapy: WFL for tasks assessed/performed Overall Cognitive Status: Within Functional Limits for tasks assessed                                        General Comments      Exercises     Assessment/Plan    PT Assessment Patient needs continued PT services  PT Problem List Decreased strength;Decreased range of motion;Decreased activity tolerance;Decreased balance;Decreased mobility;Decreased knowledge of use of DME;Decreased safety awareness;Decreased knowledge of precautions;Pain       PT Treatment Interventions DME instruction;Gait training;Stair training;Functional mobility training;Therapeutic activities;Therapeutic exercise;Neuromuscular re-education;Patient/family education    PT Goals (Current goals can be found in the Care Plan section)  Acute Rehab PT Goals Patient Stated Goal: Home today PT Goal Formulation: With patient Time For Goal Achievement: 12/05/17 Potential to Achieve Goals: Good    Frequency Min 5X/week   Barriers to discharge        Co-evaluation               AM-PAC PT "6 Clicks" Daily Activity  Outcome Measure Difficulty turning over in bed (including adjusting bedclothes, sheets and blankets)?: A Little Difficulty moving from lying on back to sitting on the side of the bed? : A Little Difficulty sitting down on and standing up from a chair with arms (e.g., wheelchair,  bedside commode, etc,.)?: A Little Help needed moving to and from a bed to chair (including a wheelchair)?: A Little Help needed walking in hospital room?: A Little Help needed climbing 3-5 steps with a railing? : A Little 6 Click Score: 18    End of Session Equipment Utilized During Treatment: Gait belt Activity Tolerance: Patient tolerated treatment well Patient left: in bed;with call bell/phone within reach;with nursing/sitter in room Nurse Communication: Mobility status PT Visit Diagnosis: Unsteadiness on feet (R26.81);Pain;Other symptoms and signs involving the nervous system (R29.898) Pain - part of body: (back)    Time: 3382-5053 PT Time Calculation (min) (ACUTE ONLY): 33 min   Charges:   PT Evaluation $PT Eval Moderate Complexity: 1 Mod PT Treatments $Gait Training: 8-22 mins   PT G Codes:        Rolinda Roan, PT, DPT Acute Rehabilitation Services Pager: [phone]   Thelma Comp 11/28/2017, 10:46 AM

## 2017-11-28 NOTE — Evaluation (Signed)
Occupational Therapy Evaluation and Discharge Patient Details Name: Mark Dunlap MRN: [id number] DOB: 1961-01-17 Today's Date: 11/28/2017    History of Present Illness Pt is a 57 y/o male who presents s/p L4-L5 PLIF on 11/27/17. 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   Clinical Impression   This 57 yo male admitted and underwent above presents to acute OT with all OT education completed, we will D/C from acute OT.    Follow Up Recommendations  No OT follow up    Equipment Recommendations  3 in 1 bedside commode       Precautions / Restrictions Precautions Precautions: Fall;Back Precaution Comments: Pt able to tell me his 3 back precautions Required Braces or Orthoses: (no brace ordered) Restrictions Weight Bearing Restrictions: No      Mobility Bed Mobility Overal bed mobility: Needs Assistance Bed Mobility: Sit to Sidelying         Sit to sidelying: Supervision    Transfers Overall transfer level: Needs assistance Equipment used: Rolling walker (2 wheeled) Transfers: Sit to/from Stand Sit to Stand: Supervision                  ADL either performed or assessed with clinical judgement   ADL                                         General ADL Comments: pt educated on using wet wipes for back per care to get cleaner eaiser and avoid twisiting, use of reacher, long shoe horn, and sock aid for LBD and pt returned demo; pt has walk in shower and built in seats so no issues with showering set up. Issued pt a reacher, sock aid, shoe horn, and long handled sponge     Vision Baseline Vision/History: Legally blind Patient Visual Report: No change from baseline              Pertinent Vitals/Pain Pain Assessment: 0-10 Pain Score: 8  Pain Location: Back  Pain Descriptors / Indicators: Operative site  guarding;Discomfort Pain Intervention(s): Limited activity within patient's tolerance;Monitored during session;Repositioned(no time for pain meds)     Hand Dominance Right   Extremity/Trunk Assessment Upper Extremity Assessment Upper Extremity Assessment: Overall WFL for tasks assessed           Communication Communication Communication: No difficulties   Cognition Arousal/Alertness: Awake/alert Behavior During Therapy: WFL for tasks assessed/performed Overall Cognitive Status: Within Functional Limits for tasks assessed                                                Home Living Family/patient expects to be discharged to:: Private residence Living Arrangements: Parent Available Help at Discharge: Family;Available 24 hours/day Type of Home: House Home Access: Level entry     Home Layout: One level     Bathroom Shower/Tub: Occupational psychologist: Standard     Home Equipment: Shower seat - built in          Prior Functioning/Environment Level of Independence: Independent        Comments: Works full time for [employer]  OT Problem List: Decreased range of motion;Pain         OT Goals(Current goals can be found in the care plan section) Acute Rehab OT Goals Patient Stated Goal: Home today  OT Frequency:                AM-PAC PT "6 Clicks" Daily Activity     Outcome Measure Help from another person eating meals?: None Help from another person taking care of personal grooming?: None Help from another person toileting, which includes using toliet, bedpan, or urinal?: None Help from another person bathing (including washing, rinsing, drying)?: None Help from another person to put on and taking off regular upper body clothing?: None Help from another person to put on and taking off regular lower body clothing?: None 6 Click Score: 24   End of Session Equipment Utilized During Treatment: Gait belt;Rolling  walker  Activity Tolerance: Patient tolerated treatment well Patient left: in bed  OT Visit Diagnosis: Pain Pain - part of body: (back)                Time: 1308-6578 OT Time Calculation (min): 19 min Charges:  OT General Charges $OT Visit: 1 Visit OT Evaluation $OT Eval Moderate Complexity: [place], [place] [phone] 11/28/2017

## 2017-11-28 NOTE — Progress Notes (Signed)
Patient is discharged from room 3C08 at this time. Alert and in stable condition. IV site d/c'd and instructions read to patient and family with understanding verbalized. Left unit via wheelchair with all belongings at side. 

## 2017-12-01 ENCOUNTER — Other Ambulatory Visit (HOSPITAL_COMMUNITY): Payer: Medicare Other

## 2017-12-02 ENCOUNTER — Other Ambulatory Visit: Payer: Self-pay | Admitting: Internal Medicine

## 2018-01-27 ENCOUNTER — Other Ambulatory Visit: Payer: Self-pay | Admitting: Internal Medicine

## 2018-01-27 NOTE — Telephone Encounter (Signed)
Left Vm re: refill request. Pt has refills available.

## 2018-01-27 NOTE — Telephone Encounter (Signed)
Copied from [place] [id number]. Topic: Quick Communication - Rx Refill/Question >> Jan 27, 2018 12:11 PM Aurelio Brash B wrote: Medication: pantoprazole (PROTONIX) 40 MG tablet  pt is asking for 90 day supply    Has the patient contacted their pharmacy? Yes he wants 90 day supply (Agent: If no, request that the patient contact the pharmacy for the refill.)  Preferred Pharmacy (with phone number or street name): [id number] [address], [place]. [phone] (Phone) [phone] (Fax)    ** Agent: Please be advised that RX refills may take up to 3 business days. We ask that you follow-up with your pharmacy.

## 2018-02-04 NOTE — Progress Notes (Addendum)
Subjective:   Mark Dunlap is a 57 y.o. male who presents for an Initial Medicare Annual Wellness Visit.  Review of Systems  No ROS.  Medicare Wellness Visit. Additional risk factors are reflected in the social history.  Cardiac Risk Factors include: advanced age (>83men, >65 women);dyslipidemia;hypertension;male gender Sleep patterns: feels rested on waking, gets up 1 times nightly to void and sleeps 7 hours nightly.    Home Safety/Smoke Alarms: Feels safe in home. Smoke alarms in place.  Living environment; residence and Firearm Safety: 1-story house/ trailer, no firearms. Lives with mother, no needs for DME, good support system Seat Belt Safety/Bike Helmet: Wears seat belt.   PSA-  Lab Results  Component Value Date   PSA 3.73 08/08/2017   PSA 3.97 09/13/2016   PSA 2.96 08/02/2015      Objective:    Today's Vitals   02/05/18 0922  BP: 122/80  Pulse: 77  Resp: 18  SpO2: 100%  Weight: 159 lb (72.1 kg)  Height: 6\' 1"  (1.854 m)   Body mass index is 20.98 kg/m.  Advanced Directives 02/05/2018 11/28/2017 11/27/2017 11/21/2017 04/28/2015 04/18/2015 07/22/2014  Does Patient Have a Medical Advance Directive? No No No No No No No  Would patient like information on creating a medical advance directive? No - Patient declined No - Patient declined No - Patient declined - - No - patient declined information No - patient declined information  Pre-existing out of facility DNR order (yellow form or pink MOST form) - - - - - - -    Current Medications (verified) Outpatient Encounter Medications as of 02/05/2018  Medication Sig  . acetaminophen (TYLENOL) 500 MG tablet Take 1,000 mg by mouth every 6 (six) hours as needed for moderate pain or headache.  [name] adapalene (DIFFERIN) 0.1 % gel Apply 1 application at bedtime topically.  [name] aspirin 81 MG tablet Take 1 tablet (81 mg total) by mouth daily.  . benzoyl peroxide (BENZOYL PEROXIDE) 5 % external liquid Apply 2 (two) times daily topically.  (Patient taking differently: Apply 1 application topically 2 (two) times daily. )  . bimatoprost (LUMIGAN) 0.01 % SOLN Place 1 drop into both eyes at bedtime.  . carboxymethylcellulose (REFRESH PLUS) 0.5 % SOLN Place 1 drop into both eyes 3 (three) times daily as needed (for clear eyes).   . diphenhydrAMINE (BENADRYL) 25 mg capsule Take 50 mg by mouth every 6 (six) hours as needed for itching.   . fluticasone (FLONASE) 50 MCG/ACT nasal spray Place 2 sprays daily into both nostrils. (Patient taking differently: Place 1 spray into both nostrils daily as needed for allergies. )  . hydrochlorothiazide (MICROZIDE) 12.5 MG capsule Take 1 capsule (12.5 mg total) daily by mouth.  [name] HYDROcodone-acetaminophen (NORCO) 10-325 MG per tablet Take 1 tablet by mouth every 6 (six) hours as needed for moderate pain.  [name] Ketorolac Tromethamine (SPRIX) 15.75 MG/SPRAY SOLN Place 1 spray into the nose every 6 (six) hours as needed (for pain).  . nortriptyline (PAMELOR) 25 MG capsule Take 25 mg by mouth at bedtime.  . ondansetron (ZOFRAN) 4 MG tablet TAKE 1 TABLET (4 MG TOTAL) BY MOUTH EVERY 8 (EIGHT) HOURS AS NEEDED FOR NAUSEA OR VOMITING.  [name] oxybutynin (DITROPAN) 5 MG tablet Take 5 mg by mouth at bedtime.  . pantoprazole (PROTONIX) 40 MG tablet TAKE 1 TABLET (40 MG TOTAL) BY MOUTH DAILY.  . tamsulosin (FLOMAX) 0.4 MG CAPS capsule TAKE 1 CAPSULE (0.4 MG TOTAL) BY MOUTH twice daily  . timolol (  TIMOPTIC) 0.5 % ophthalmic solution Place 1 drop into both eyes daily.  [name] tiZANidine (ZANAFLEX) 4 MG capsule TAKE 1 CAPSULE BY ORAL ROUTE 3 TIMES EVERY DAY  . tretinoin (RETIN-A) 0.1 % cream APPLY TO AFFECTED AREA AT BEDTIME TO FACE  . zolpidem (AMBIEN) 10 MG tablet Take 1 tablet (10 mg total) at bedtime as needed by mouth. for sleep (Patient taking differently: Take 10 mg by mouth at bedtime. )  . ranitidine (ZANTAC) 150 MG tablet Take 1 tablet (150 mg total) 2 (two) times daily by mouth.  . [DISCONTINUED] HYDROmorphone (DILAUDID)  2 MG tablet Take 1 tablet (2 mg total) by mouth every 6 (six) hours as needed for severe pain. (Patient not taking: Reported on 02/05/2018)   No facility-administered encounter medications on file as of 02/05/2018.     Allergies (verified) Hydrocodone; Tramadol; Morphine and related; and Oxycodone   History: Past Medical History:  Diagnosis Date  . Acute prostatitis 05/23/2009  . Adjustment disorder with depressed mood 09/2007  . CERVICAL RADICULOPATHY, LEFT 08/30/2008  . DJD (degenerative joint disease) of hip   . DJD (degenerative joint disease) of knee    left knee  . GERD (gastroesophageal reflux disease)   . 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 H/O: substance abuse    hx of ETOH/Crack cocaine-none since 11/08 per pt/Does not Drive due to this  . HYPERTENSION 08/30/2008  . HYPERTHYROIDISM 02/13/2010   pt was told by  Dr Jenny Reichmann  that thyroid was now back to normal .. 2012 ...   . HYPOTENSION 10/31/2009  . 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 Other and unspecified hyperlipidemia 08/20/2013  . PONV (postoperative nausea and vomiting)   . Seasonal allergies   . SPINAL STENOSIS, CERVICAL 08/08/2009  . THYROID NODULE, RIGHT 03/02/2010   Past Surgical History:  Procedure Laterality Date  . ANTERIOR CERVICAL DECOMP/DISCECTOMY FUSION  10/11/2011   Procedure: ANTERIOR CERVICAL DECOMPRESSION/DISCECTOMY FUSION 3 LEVELS;  Surgeon: Eustace Moore;  Location: [facility];  Service: Neurosurgery;  Laterality: N/A;  Cervical three-four ,cervical four five cervical five six Anterior Cervical Decompression Fusion with peek + plate Nuvasive translational plate Orthofix peek (2 1/2 hours) Rm # [id number]  . ANTERIOR CERVICAL DECOMP/DISCECTOMY FUSION N/A 07/29/2014   Procedure: ANTERIOR CERVICAL DECOMPRESSION/DISCECTOMY FUSION 1 LEVEL/HARDWARE REMOVAL;  Surgeon: Eustace Moore, MD;  Location: [facility];  Service: Neurosurgery;  Laterality: N/A;  cervical six-seven  . POSTERIOR CERVICAL FUSION/FORAMINOTOMY N/A 09/23/2013   Procedure: CERVICALTWO TO CERVICAL SEVEN POSTERIOR CERVICAL FUSION/FORAMINOTOMY WITH LATERAL MASS FIXATION;  Surgeon: Eustace Moore, MD;  Location: [facility];  Service: Neurosurgery;  Laterality: N/A;  . Right Hand I&D  12/07   s/p rdue to abscess-Dr. Amedeo Plenty  . TOTAL HIP ARTHROPLASTY  12/17/2011   Procedure: TOTAL HIP ARTHROPLASTY;  Surgeon: Johnny Bridge, MD;  Location: [facility];  Service: Orthopedics;  Laterality: Left;   Family History  Problem Relation Age of Onset  . Alcohol abuse Father   . Stroke Father   . Heart disease Father   . Arthritis Mother   . Hyperlipidemia Mother   . Goiter Mother        resection of benign goiter  . Hypertension Sister   . Diabetes Sister   . Goiter Sister        resection of benign goiter  .  Diabetes Brother   . Alcohol abuse Brother   . Alcohol abuse Cousin   . Heart disease Other        Aunt  . Colon cancer Neg Hx    Social History   Socioeconomic History  . Marital status: Single    Spouse name: Not on file  . Number of children: 0  . Years of education: Not on file  . Highest education level: Not on file  Occupational History  . Occupation: Industries for the [employer]  . Financial resource strain: Not hard at all  . Food insecurity:    Worry: Never true    Inability: Never true  . Transportation needs:    Medical: No    Non-medical: No  Tobacco Use  . Smoking status: Current Every Day Smoker    Packs/day: 0.50    Years: 40.00    Pack years: 20.00    Types: Cigarettes  . Smokeless tobacco: Never Used  . Tobacco comment: last crack over 1 yr   alcohol last time few yrs. States today is his quit date for smoking   Substance and Sexual Activity  . Alcohol use: No    Alcohol/week: 0.0 oz    Comment: hx of alcohol abuse  . Drug use: Yes    Types: "Crack" cocaine    Comment: history of, "  clean for 24 MONTHS"  . Sexual activity: Not Currently  Lifestyle  . Physical activity:    Days per week: 0 days    Minutes per session: 0 min  . Stress: Rather much  Relationships  . Social connections:    Talks on phone: More than three times a week    Gets together: More than three times a week    Attends religious service: More than 4 times per year    Active member of club or organization: Yes    Attends meetings of clubs or organizations: More than 4 times per year    Relationship status: Never married  Other Topics Concern  . Not on file  Social History Narrative  . Not on file   Tobacco Counseling Ready to quit: Not Answered Counseling given: Not Answered Comment: last crack over 1 yr   alcohol last time few yrs. States today is his quit date for smoking   Activities of Daily Living In your present state of health, do you have any difficulty performing the following activities: 02/05/2018 11/28/2017  Hearing? N N  Vision? N Y  Difficulty concentrating or making decisions? N N  Walking or climbing stairs? N N  Dressing or bathing? N Y  Doing errands, shopping? N N  Preparing Food and eating ? N -  Using the Toilet? N -  In the past six months, have you accidently leaked urine? N -  Do you have problems with loss of bowel control? N -  Managing your Medications? N -  Managing your Finances? N -  Housekeeping or managing your Housekeeping? N -  Some recent data might be hidden     Immunizations and Health Maintenance Immunization History  Administered Date(s) Administered  . Influenza Split 08/20/2012  . Influenza Whole 08/25/2008  . Influenza,inj,Quad PF,6+ Mos 07/19/2013, 07/06/2014, 08/08/2015, 08/21/2017  . Pneumococcal Polysaccharide-23 07/30/2014  . Td 09/15/2006  . Tdap 08/08/2015   There are no preventive care reminders to display for this patient.  Patient Care Team: Biagio Borg, MD as PCP - Jeannette How, MD as Consulting Physician  (  Neurosurgery) Warden Fillers, MD as Consulting Physician (Ophthalmology)  Indicate any recent Medical Services you may have received from other than [facility] providers in the past year (date may be approximate).    Assessment:   This is a routine wellness examination for Tulio. Physical assessment deferred to PCP.   Hearing/Vision screen Hearing Screening Comments: Able to hear conversational tones w/o difficulty. No issues reported.  Passed passed test Vision Screening Comments: appointment every 6 months Dr. Katy Fitch  Dietary issues and exercise activities discussed: Current Exercise Habits: The patient does not participate in regular exercise at present, Exercise limited by: orthopedic condition(s)  Diet (meal preparation, eat out, water intake, caffeinated beverages, dairy products, fruits and vegetables): in general, a "healthy" diet  , well balanced, eats a variety of fruits and vegetables daily, limits salt, fat/cholesterol, sugar,carbohydrates,caffeine, drinks 6-8 glasses of water daily.  Goals    . Patient Stated     I want to quit smoking, decrease the amount of cigarettes I smoke daily. Limit how many packs of cigarettes I buy at the same time. Think about going to [facility] smoking cessation classes.      Depression Screen PHQ 2/9 Scores 02/05/2018 08/21/2017 09/13/2016 08/08/2015  PHQ - 2 Score 0 0 0 1  PHQ- 9 Score 0 - - -    Fall Risk Fall Risk  08/21/2017 09/13/2016 08/08/2015 04/05/2014  Falls in the past year? No No No No   Cognitive Function:       Ad8 score reviewed for issues:  Issues making decisions: no  Less interest in hobbies / activities: no  Repeats questions, stories (family complaining): no  Trouble using ordinary gadgets (microwave, computer, phone):no  Forgets the month or year: no  Mismanaging finances: no  Remembering appts: no  Daily problems with thinking and/or memory: no Ad8 score is= 0 Screening Tests Health Maintenance    Topic Date Due  . INFLUENZA VACCINE  05/14/2018  . COLONOSCOPY  04/27/2020  . TETANUS/TDAP  08/07/2025  . Hepatitis C Screening  Completed  . HIV Screening  Completed      Plan:  Continue doing brain stimulating activities (puzzles, reading, adult coloring books, staying active) to keep memory sharp.   Continue to eat heart healthy diet (full of fruits, vegetables, whole grains, lean protein, water--limit salt, fat, and sugar intake) and increase physical activity as tolerate  I have personally reviewed and noted the following in the patient's chart:   . Medical and social history . Use of alcohol, tobacco or illicit drugs  . Current medications and supplements . Functional ability and status . Nutritional status . Physical activity . Advanced directives . List of other physicians . Vitals . Screenings to include cognitive, depression, and falls . Referrals and appointments  In addition, I have reviewed and discussed with patient certain preventive protocols, quality metrics, and best practice recommendations. A written personalized care plan for preventive services as well as general preventive health recommendations were provided to patient.     Michiel Cowboy, RN   02/05/2018   Medical screening examination/treatment/procedure(s) were performed by non-physician practitioner and as supervising physician I was immediately available for consultation/collaboration. I agree with above. Cathlean Cower, MD

## 2018-02-05 ENCOUNTER — Ambulatory Visit (INDEPENDENT_AMBULATORY_CARE_PROVIDER_SITE_OTHER): Payer: Medicare Other | Admitting: *Deleted

## 2018-02-05 VITALS — BP 122/80 | HR 77 | Resp 18 | Ht 73.0 in | Wt 159.0 lb

## 2018-02-05 DIAGNOSIS — Z0000 Encounter for general adult medical examination without abnormal findings: Secondary | ICD-10-CM | POA: Diagnosis not present

## 2018-02-05 NOTE — Patient Instructions (Addendum)
Continue doing brain stimulating activities (puzzles, reading, adult coloring books, staying active) to keep memory sharp.   Continue to eat heart healthy diet (full of fruits, vegetables, whole grains, lean protein, water--limit salt, fat, and sugar intake) and increase physical activity as tolerate  Mark Dunlap , Thank you for taking time to come for your Medicare Wellness Visit. I appreciate your ongoing commitment to your health goals. Please review the following plan we discussed and let me know if I can assist you in the future.   These are the goals we discussed: Goals    . Patient Stated     I want to quit smoking, decrease the amount of cigarettes I smoke daily. Limit how many packs of cigarettes I buy at the same time. Think about going to [facility] smoking cessation classes.       This is a list of the screening recommended for you and due dates:  Health Maintenance  Topic Date Due  . Flu Shot  05/14/2018  . Colon Cancer Screening  04/27/2020  . Tetanus Vaccine  08/07/2025  .  Hepatitis C: One time screening is recommended by Center for Disease Control  (CDC) for  adults born from [birth year] through 1966.   Completed  . HIV Screening  Completed

## 2018-02-16 ENCOUNTER — Telehealth: Payer: Self-pay

## 2018-02-16 NOTE — Telephone Encounter (Signed)
That normally is not possible unless it is an emergency  Please ask pt to make ROV

## 2018-02-16 NOTE — Telephone Encounter (Signed)
Copied from [place]. Topic: Referral - Request >> Feb 16, 2018 11:50 AM Synthia Innocent wrote: Reason for CRM: Requesting referral to GI, requesting appt this week for acid reflux. Please advise

## 2018-02-16 NOTE — Telephone Encounter (Signed)
I have left patient a vm to call back to schedule appt.  °

## 2018-02-17 ENCOUNTER — Encounter: Payer: Self-pay | Admitting: Internal Medicine

## 2018-02-17 ENCOUNTER — Ambulatory Visit: Payer: Medicare Other | Admitting: Internal Medicine

## 2018-02-17 ENCOUNTER — Ambulatory Visit (INDEPENDENT_AMBULATORY_CARE_PROVIDER_SITE_OTHER): Payer: Medicare Other | Admitting: Internal Medicine

## 2018-02-17 VITALS — BP 154/96 | HR 86 | Temp 98.0°F | Ht 73.0 in | Wt 162.0 lb

## 2018-02-17 DIAGNOSIS — L709 Acne, unspecified: Secondary | ICD-10-CM | POA: Diagnosis not present

## 2018-02-17 DIAGNOSIS — I10 Essential (primary) hypertension: Secondary | ICD-10-CM | POA: Diagnosis not present

## 2018-02-17 DIAGNOSIS — K219 Gastro-esophageal reflux disease without esophagitis: Secondary | ICD-10-CM | POA: Diagnosis not present

## 2018-02-17 MED ORDER — TRETINOIN 0.1 % EX CREA: TOPICAL_CREAM | CUTANEOUS | 2 refills | Status: DC

## 2018-02-17 MED ORDER — BENZOYL PEROXIDE 5 % EX LIQD: 1.0000 "application " | Freq: Two times a day (BID) | CUTANEOUS | 0 refills | Status: DC

## 2018-02-17 MED ORDER — PANTOPRAZOLE SODIUM 40 MG PO TBEC: DELAYED_RELEASE_TABLET | ORAL | 3 refills | Status: DC

## 2018-02-17 MED ORDER — ADAPALENE 0.1 % EX GEL: 1.0000 "application " | Freq: Every day | CUTANEOUS | 0 refills | Status: DC

## 2018-02-17 NOTE — Patient Instructions (Addendum)
Ok to restart the protonix 40 mg and zantac 150 mg twice per day  Please continue all other medications as before, and refills have been done if requested  - the creams  Please have the pharmacy call with any other refills you may need..  Please keep your appointments with your specialists as you may have planned

## 2018-02-17 NOTE — Assessment & Plan Note (Signed)
OK for med refills,  to f/u any worsening symptoms or concerns

## 2018-02-17 NOTE — Assessment & Plan Note (Signed)
stable overall by history and exam, recent data reviewed with pt, and pt to continue medical treatment as before,  to f/u any worsening symptoms or concerns  

## 2018-02-17 NOTE — Progress Notes (Signed)
Subjective:    Patient ID: Mark Dunlap, male    DOB: November 20, 1960, 57 y.o.   MRN: [id number]  HPI  Here to f/u, Pt denies chest pain, increased sob or doe, wheezing, orthopnea, PND, increased LE swelling, palpitations, dizziness or syncope.  Has had mild to mod worsening reflux, epigastric abd pain and belching, without dysphagia, n/v, bowel change or blood.  Apparently has had some difficulty with refills of the last rx for protonix, and has been out of protonix for one month, b/c was only given 30 pills instead of 90, now out.  Still has low funds, unable to see dermatology, asks for adapalene, benzoyl peroxide and RetinA refills as this has helped.  BP at home usually < 140/90  No other new complaints or interval change Past Medical History:  Diagnosis Date  . Acute prostatitis 05/23/2009  . Adjustment disorder with depressed mood 09/2007  . CERVICAL RADICULOPATHY, LEFT 08/30/2008  . DJD (degenerative joint disease) of hip   . DJD (degenerative joint disease) of knee    left knee  . GERD (gastroesophageal reflux disease)   . 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 H/O: substance abuse    hx of ETOH/Crack cocaine-none since 11/08 per pt/Does not Drive due to this  . HYPERTENSION 08/30/2008  . HYPERTHYROIDISM 02/13/2010   pt was told by  Dr Jenny Reichmann  that thyroid was now back to normal .. 2012 ...   . HYPOTENSION 10/31/2009  . 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 Other and unspecified hyperlipidemia 08/20/2013  . PONV (postoperative nausea and vomiting)   . Seasonal allergies   . SPINAL STENOSIS, CERVICAL 08/08/2009  . THYROID NODULE, RIGHT 03/02/2010   Past Surgical History:  Procedure Laterality Date  . ANTERIOR CERVICAL DECOMP/DISCECTOMY FUSION  10/11/2011   Procedure: ANTERIOR CERVICAL DECOMPRESSION/DISCECTOMY FUSION 3 LEVELS;  Surgeon: Eustace Moore;  Location: [facility];   Service: Neurosurgery;  Laterality: N/A;  Cervical three-four ,cervical four five cervical five six Anterior Cervical Decompression Fusion with peek + plate Nuvasive translational plate Orthofix peek (2 1/2 hours) Rm # 32  . ANTERIOR CERVICAL DECOMP/DISCECTOMY FUSION N/A 07/29/2014   Procedure: ANTERIOR CERVICAL DECOMPRESSION/DISCECTOMY FUSION 1 LEVEL/HARDWARE REMOVAL;  Surgeon: Eustace Moore, MD;  Location: [facility];  Service: Neurosurgery;  Laterality: N/A;  cervical six-seven  . POSTERIOR CERVICAL FUSION/FORAMINOTOMY N/A 09/23/2013   Procedure: CERVICALTWO TO CERVICAL SEVEN POSTERIOR CERVICAL FUSION/FORAMINOTOMY WITH LATERAL MASS FIXATION;  Surgeon: Eustace Moore, MD;  Location: [facility];  Service: Neurosurgery;  Laterality: N/A;  . Right Hand I&D  12/07   s/p rdue to abscess-Dr. Amedeo Plenty  . TOTAL HIP ARTHROPLASTY  12/17/2011   Procedure: TOTAL HIP ARTHROPLASTY;  Surgeon: Johnny Bridge, MD;  Location: [facility];  Service: Orthopedics;  Laterality: Left;    reports that he has been smoking cigarettes.  He has a 20.00 pack-year smoking history. He has never used smokeless tobacco. He reports that he has current or past drug history. Drug: "Crack" cocaine. He reports that he does not drink alcohol. family history includes Alcohol abuse in his brother, cousin, and father; Arthritis in his mother; Diabetes in his brother and sister; Goiter in his mother and sister; Heart disease in his father and other; Hyperlipidemia in his mother; Hypertension in his sister; Stroke in his father. Allergies  Allergen Reactions  .  Hydrocodone Itching and Other (See Comments)    Pt can tolerate  . Tramadol Itching  . Morphine And Related Itching  . Oxycodone Itching   Current Outpatient Medications on File Prior to Visit  Medication Sig Dispense Refill  . acetaminophen (TYLENOL) 500 MG tablet Take 1,000 mg by mouth every 6 (six) hours as needed for moderate pain or headache.    [name] aspirin 81 MG tablet Take 1  tablet (81 mg total) by mouth daily. 30 tablet   . bimatoprost (LUMIGAN) 0.01 % SOLN Place 1 drop into both eyes at bedtime.    . carboxymethylcellulose (REFRESH PLUS) 0.5 % SOLN Place 1 drop into both eyes 3 (three) times daily as needed (for clear eyes).     . diphenhydrAMINE (BENADRYL) 25 mg capsule Take 50 mg by mouth every 6 (six) hours as needed for itching.     . fluticasone (FLONASE) 50 MCG/ACT nasal spray Place 2 sprays daily into both nostrils. (Patient taking differently: Place 1 spray into both nostrils daily as needed for allergies. ) 16 g 5  . hydrochlorothiazide (MICROZIDE) 12.5 MG capsule Take 1 capsule (12.5 mg total) daily by mouth. 90 capsule 3  . HYDROcodone-acetaminophen (NORCO) 10-325 MG per tablet Take 1 tablet by mouth every 6 (six) hours as needed for moderate pain. 60 tablet 0  . Ketorolac Tromethamine (SPRIX) 15.75 MG/SPRAY SOLN Place 1 spray into the nose every 6 (six) hours as needed (for pain).    . nortriptyline (PAMELOR) 25 MG capsule Take 25 mg by mouth at bedtime.  0  . ondansetron (ZOFRAN) 4 MG tablet TAKE 1 TABLET (4 MG TOTAL) BY MOUTH EVERY 8 (EIGHT) HOURS AS NEEDED FOR NAUSEA OR VOMITING. 20 tablet 2  . oxybutynin (DITROPAN) 5 MG tablet Take 5 mg by mouth at bedtime.  11  . ranitidine (ZANTAC) 150 MG tablet Take 1 tablet (150 mg total) 2 (two) times daily by mouth. 180 tablet 3  . tamsulosin (FLOMAX) 0.4 MG CAPS capsule TAKE 1 CAPSULE (0.4 MG TOTAL) BY MOUTH twice daily 180 capsule 3  . timolol (TIMOPTIC) 0.5 % ophthalmic solution Place 1 drop into both eyes daily.  6  . tiZANidine (ZANAFLEX) 4 MG capsule TAKE 1 CAPSULE BY ORAL ROUTE 3 TIMES EVERY DAY 60 capsule 2  . zolpidem (AMBIEN) 10 MG tablet Take 1 tablet (10 mg total) at bedtime as needed by mouth. for sleep (Patient taking differently: Take 10 mg by mouth at bedtime. ) 90 tablet 1   No current facility-administered medications on file prior to visit.    Review of Systems  Constitutional: Negative for  other unusual diaphoresis or sweats HENT: Negative for ear discharge or swelling Eyes: Negative for other worsening visual disturbances Respiratory: Negative for stridor or other swelling  Gastrointestinal: Negative for worsening distension or other blood Genitourinary: Negative for retention or other urinary change Musculoskeletal: Negative for other MSK pain or swelling Skin: Negative for color change or other new lesions Neurological: Negative for worsening tremors and other numbness  Psychiatric/Behavioral: Negative for worsening agitation or other fatigue All other system neg per pt    Objective:   Physical Exam BP (!) 154/96   Pulse 86   Temp 98 F (36.7 C) (Oral)   Ht 6\' 1"  (1.854 m)   Wt 162 lb (73.5 kg)   SpO2 97%   BMI 21.37 kg/m  VS noted,  Constitutional: Pt appears in NAD HENT: Head: NCAT.  Right Ear: External ear normal.  Left Ear: External  ear normal.  Eyes: . Pupils are equal, round, and reactive to light. Conjunctivae and EOM are normal Nose: without d/c or deformity Neck: Neck supple. Gross normal ROM Cardiovascular: Normal rate and regular rhythm.   Pulmonary/Chest: Effort normal and breath sounds without rales or wheezing.  Abd:  Soft, NT except mild epigastric tender, ND, + BS, no organomegaly Neurological: Pt is alert. At baseline orientation, motor grossly intact Skin: Skin is warm. No rashes, other new lesions, no LE edema, + mild acne Psychiatric: Pt behavior is normal without agitation  No other exam findings    Assessment & Plan:

## 2018-02-17 NOTE — Assessment & Plan Note (Signed)
Mild worsening without dysphagia, wt loss other red flags, for restart PPI, cont zantac, consider GI if not improved

## 2018-02-27 ENCOUNTER — Other Ambulatory Visit: Payer: Self-pay | Admitting: Internal Medicine

## 2018-03-02 NOTE — Telephone Encounter (Signed)
Done erx 

## 2018-03-02 NOTE — Telephone Encounter (Signed)
11/24/2017 90# 

## 2018-03-16 DIAGNOSIS — M898X8 Other specified disorders of bone, other site: Secondary | ICD-10-CM | POA: Insufficient documentation

## 2018-04-03 ENCOUNTER — Other Ambulatory Visit: Payer: Self-pay | Admitting: Internal Medicine

## 2018-04-23 ENCOUNTER — Ambulatory Visit: Payer: Medicare Other | Admitting: Internal Medicine

## 2018-04-23 ENCOUNTER — Encounter: Payer: Self-pay | Admitting: Internal Medicine

## 2018-04-23 ENCOUNTER — Telehealth: Payer: Self-pay | Admitting: Internal Medicine

## 2018-04-23 VITALS — BP 142/90 | HR 81 | Temp 98.2°F | Ht 73.0 in | Wt 162.0 lb

## 2018-04-23 DIAGNOSIS — A630 Anogenital (venereal) warts: Secondary | ICD-10-CM

## 2018-04-23 DIAGNOSIS — G8929 Other chronic pain: Secondary | ICD-10-CM

## 2018-04-23 DIAGNOSIS — I10 Essential (primary) hypertension: Secondary | ICD-10-CM

## 2018-04-23 DIAGNOSIS — F17200 Nicotine dependence, unspecified, uncomplicated: Secondary | ICD-10-CM | POA: Diagnosis not present

## 2018-04-23 MED ORDER — IMIQUIMOD 2.5 % EX CREA: TOPICAL_CREAM | CUTANEOUS | 1 refills | Status: DC

## 2018-04-23 MED ORDER — PANTOPRAZOLE SODIUM 40 MG PO TBEC: DELAYED_RELEASE_TABLET | ORAL | 3 refills | Status: DC

## 2018-04-23 NOTE — Patient Instructions (Addendum)
Please take all new medication as prescribed - the cream  Please continue all other medications as before, and refills have been done if requested.  Please have the pharmacy call with any other refills you may need.  Please keep your appointments with your specialists as you may have planned   

## 2018-04-23 NOTE — Telephone Encounter (Signed)
Patient has spoken with the [facility].  States that lmiquimod 2.5% [facility].  Patient is requesting this medication to be sent over again.  Patient will check later in the day with the pharmacy to see if they have received the second script.

## 2018-04-23 NOTE — Telephone Encounter (Signed)
Copied from [place] [id number]. Topic: General - Other >> Apr 23, 2018  1:58 PM Yvette Rack wrote: Reason for CRM: Solmon Ice calling from [facility] [phone] stating that the pt states that he is suppose to take pantoprazole (PROTONIX) 40 MG tablet twice a day bit it only says once a day she also states that it can't be filled until July 14th  pt states that he is in pain and suppose to take the medicine Protonix twice a day please call pt back to clarify

## 2018-04-23 NOTE — Assessment & Plan Note (Signed)
Urged to quit 

## 2018-04-23 NOTE — Assessment & Plan Note (Signed)
For aldara cream asd,  to f/u any worsening symptoms or concerns

## 2018-04-23 NOTE — Assessment & Plan Note (Signed)
stable overall by history and exam, and pt to continue medical treatment as before,  to f/u any worsening symptoms or concerns 

## 2018-04-23 NOTE — Telephone Encounter (Signed)
Already addressed with pt  Please remind him we discussed taking the Protonix once per day, and if not able to get filled now, he should take OTC nexium 1 per day until can get back to the protonix

## 2018-04-23 NOTE — Assessment & Plan Note (Signed)
Stable overall,  to f/u any worsening symptoms or concerns 

## 2018-04-23 NOTE — Progress Notes (Signed)
Subjective:    Patient ID: Mark Dunlap, male    DOB: 10-14-1961, 57 y.o.   MRN: [id number]  HPI  Here with skin lesion perianal now fo several wks, kind of tender but only to touch, no fever, drainage and does not feel ill.  Felt funny mentioning to male dermatology he sees for acne.  Has hx of what sound like perinal cyst or abscess but this seems different.  Pt denies chest pain, increased sob or doe, wheezing, orthopnea, PND, increased LE swelling, palpitations, dizziness or syncope.  Pt denies new neurological symptoms such as new headache, or facial or extremity weakness or numbness   Pt denies polydipsia, polyuria.  conts to smoke, not ready to quit.  Overall pain tolerable today on current tx Past Medical History:  Diagnosis Date  . Acute prostatitis 05/23/2009  . Adjustment disorder with depressed mood 09/2007  . CERVICAL RADICULOPATHY, LEFT 08/30/2008  . DJD (degenerative joint disease) of hip   . DJD (degenerative joint disease) of knee    left knee  . GERD (gastroesophageal reflux disease)   . 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 H/O: substance abuse    hx of ETOH/Crack cocaine-none since 11/08 per pt/Does not Drive due to this  . HYPERTENSION 08/30/2008  . HYPERTHYROIDISM 02/13/2010   pt was told by  Dr Jenny Reichmann  that thyroid was now back to normal .. 2012 ...   . HYPOTENSION 10/31/2009  . 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 Other and unspecified hyperlipidemia 08/20/2013  . PONV (postoperative nausea and vomiting)   . Seasonal allergies   . SPINAL STENOSIS, CERVICAL 08/08/2009  . THYROID NODULE, RIGHT 03/02/2010   Past Surgical History:  Procedure Laterality Date  . ANTERIOR CERVICAL DECOMP/DISCECTOMY FUSION  10/11/2011   Procedure: ANTERIOR CERVICAL DECOMPRESSION/DISCECTOMY FUSION 3 LEVELS;  Surgeon: Eustace Moore;  Location: [facility];  Service: Neurosurgery;   Laterality: N/A;  Cervical three-four ,cervical four five cervical five six Anterior Cervical Decompression Fusion with peek + plate Nuvasive translational plate Orthofix peek (2 1/2 hours) Rm # 32  . ANTERIOR CERVICAL DECOMP/DISCECTOMY FUSION N/A 07/29/2014   Procedure: ANTERIOR CERVICAL DECOMPRESSION/DISCECTOMY FUSION 1 LEVEL/HARDWARE REMOVAL;  Surgeon: Eustace Moore, MD;  Location: [place] [facility];  Service: Neurosurgery;  Laterality: N/A;  cervical six-seven  . POSTERIOR CERVICAL FUSION/FORAMINOTOMY N/A 09/23/2013   Procedure: CERVICALTWO TO CERVICAL SEVEN POSTERIOR CERVICAL FUSION/FORAMINOTOMY WITH LATERAL MASS FIXATION;  Surgeon: Eustace Moore, MD;  Location: [place] [facility];  Service: Neurosurgery;  Laterality: N/A;  . Right Hand I&D  12/07   s/p rdue to abscess-Dr. Amedeo Plenty  . TOTAL HIP ARTHROPLASTY  12/17/2011   Procedure: TOTAL HIP ARTHROPLASTY;  Surgeon: Johnny Bridge, MD;  Location: [facility] [place];  Service: Orthopedics;  Laterality: Left;    reports that he has been smoking cigarettes.  He has a 20.00 pack-year smoking history. He has never used smokeless tobacco. He reports that he has current or past drug history. Drug: "Crack" cocaine. He reports that he does not drink alcohol. family history includes Alcohol abuse in his brother, cousin, and father; Arthritis in his mother; Diabetes in his brother and sister; Goiter in his mother and sister; Heart disease in his father and other; Hyperlipidemia in his mother; Hypertension in his sister; Stroke in his father. Allergies  Allergen Reactions  . Hydrocodone Itching  and Other (See Comments)    Pt can tolerate  . Tramadol Itching  . Morphine And Related Itching  . Oxycodone Itching   Current Outpatient Medications on File Prior to Visit  Medication Sig Dispense Refill  . acetaminophen (TYLENOL) 500 MG tablet Take 1,000 mg by mouth every 6 (six) hours as needed for moderate pain or headache.    [name] adapalene (DIFFERIN) 0.1 % gel APPLY 1 APPLICATION  TOPICALLY AT BEDTIME. 45 g 0  . aspirin 81 MG tablet Take 1 tablet (81 mg total) by mouth daily. 30 tablet   . benzoyl peroxide (BENZOYL PEROXIDE) 5 % external liquid Apply 1 application topically 2 (two) times daily. 226 g 0  . bimatoprost (LUMIGAN) 0.01 % SOLN Place 1 drop into both eyes at bedtime.    . carboxymethylcellulose (REFRESH PLUS) 0.5 % SOLN Place 1 drop into both eyes 3 (three) times daily as needed (for clear eyes).     . diphenhydrAMINE (BENADRYL) 25 mg capsule Take 50 mg by mouth every 6 (six) hours as needed for itching.     . fluticasone (FLONASE) 50 MCG/ACT nasal spray Place 2 sprays daily into both nostrils. (Patient taking differently: Place 1 spray into both nostrils daily as needed for allergies. ) 16 g 5  . gabapentin (NEURONTIN) 100 MG capsule Take 200 mg by mouth 2 (two) times daily.  2  . hydrochlorothiazide (MICROZIDE) 12.5 MG capsule Take 1 capsule (12.5 mg total) daily by mouth. 90 capsule 3  . HYDROcodone-acetaminophen (NORCO) 10-325 MG per tablet Take 1 tablet by mouth every 6 (six) hours as needed for moderate pain. 60 tablet 0  . Ketorolac Tromethamine (SPRIX) 15.75 MG/SPRAY SOLN Place 1 spray into the nose every 6 (six) hours as needed (for pain).    . nortriptyline (PAMELOR) 25 MG capsule Take 25 mg by mouth at bedtime.  0  . ondansetron (ZOFRAN) 4 MG tablet TAKE 1 TABLET (4 MG TOTAL) BY MOUTH EVERY 8 (EIGHT) HOURS AS NEEDED FOR NAUSEA OR VOMITING. 20 tablet 2  . oxybutynin (DITROPAN) 5 MG tablet Take 5 mg by mouth at bedtime.  11  . ranitidine (ZANTAC) 150 MG tablet Take 1 tablet (150 mg total) 2 (two) times daily by mouth. 180 tablet 3  . tamsulosin (FLOMAX) 0.4 MG CAPS capsule TAKE 1 CAPSULE (0.4 MG TOTAL) BY MOUTH twice daily 180 capsule 3  . timolol (TIMOPTIC) 0.5 % ophthalmic solution Place 1 drop into both eyes daily.  6  . tiZANidine (ZANAFLEX) 4 MG capsule TAKE 1 CAPSULE BY ORAL ROUTE 3 TIMES EVERY DAY 60 capsule 2  . tretinoin (RETIN-A) 0.1 % cream  APPLY TO AFFECTED AREA AT BEDTIME TO FACE 45 g 2  . zolpidem (AMBIEN) 10 MG tablet TAKE 1 TABLET AT BEDTIME AS NEEDED FOR SLEEP 90 tablet 1   No current facility-administered medications on file prior to visit.    Review of Systems  Constitutional: Negative for other unusual diaphoresis or sweats HENT: Negative for ear discharge or swelling Eyes: Negative for other worsening visual disturbances Respiratory: Negative for stridor or other swelling  Gastrointestinal: Negative for worsening distension or other blood Genitourinary: Negative for retention or other urinary change Musculoskeletal: Negative for other MSK pain or swelling Skin: Negative for color change or other new lesions Neurological: Negative for worsening tremors and other numbness  Psychiatric/Behavioral: Negative for worsening agitation or other fatigue All other system neg per pt    Objective:   Physical Exam BP (!) 142/90 (BP Location:  Left Arm, Patient Position: Sitting, Cuff Size: Normal)   Pulse 81   Temp 98.2 F (36.8 C) (Oral)   Ht 6\' 1"  (1.854 m)   Wt 162 lb (73.5 kg)   SpO2 96%   BMI 21.37 kg/m  VS noted, not ill appearing Constitutional: Pt appears in NAD HENT: Head: NCAT.  Right Ear: External ear normal.  Left Ear: External ear normal.  Eyes: . Pupils are equal, round, and reactive to light. Conjunctivae and EOM are normal Nose: without d/c or deformity Neck: Neck supple. Gross normal ROM Cardiovascular: Normal rate and regular rhythm.   Pulmonary/Chest: Effort normal and breath sounds without rales or wheezing.  Neurological: Pt is alert. At baseline orientation, motor grossly intact Skin: Skin is warm Has + 10 mm raised lesion left perinal without other skin erythema, swelling or tender,  No overt abscess or cyst or hemorrhoid,  No other new lesions, no LE edema Psychiatric: Pt behavior is normal without agitation         Assessment & Plan:

## 2018-04-24 ENCOUNTER — Other Ambulatory Visit: Payer: Self-pay | Admitting: *Deleted

## 2018-04-24 ENCOUNTER — Telehealth: Payer: Self-pay | Admitting: Internal Medicine

## 2018-04-24 MED ORDER — IMIQUIMOD 2.5 % EX CREA: TOPICAL_CREAM | CUTANEOUS | 1 refills | Status: DC

## 2018-04-24 MED ORDER — IMIQUIMOD 5 % EX CREA: TOPICAL_CREAM | CUTANEOUS | 0 refills | Status: DC

## 2018-04-24 NOTE — Telephone Encounter (Signed)
Pt informed of below.  

## 2018-04-24 NOTE — Telephone Encounter (Signed)
Call to pharmacy- per pharmacist- 2.5% requires PA where 5% does not. They are considered different drugs. Please let provider review for which he wants to use for patient.

## 2018-04-24 NOTE — Telephone Encounter (Signed)
Ok, this is done 

## 2018-04-24 NOTE — Telephone Encounter (Signed)
Copied from [place] [id number]. Topic: Quick Communication - Rx Refill/Question >> Apr 24, 2018  2:11 PM Margot Ables wrote: Medication: Imiquimod 2.5 % CREA  Pt called stating pharmacy did not have RX. I called pharmacy and spoke to [facility]. She confirmed RX was not received and has checked again. Pharmacy did receive protonix RX but it cannot be filled until Sunday. Please resend RX for cream. Please call to notify pt when sent in. [id number] [address], [place]. [phone] (Phone) [phone] (Fax)

## 2018-05-02 ENCOUNTER — Other Ambulatory Visit: Payer: Self-pay | Admitting: Internal Medicine

## 2018-05-16 ENCOUNTER — Other Ambulatory Visit: Payer: Self-pay | Admitting: Internal Medicine

## 2018-06-10 ENCOUNTER — Other Ambulatory Visit: Payer: Self-pay | Admitting: Internal Medicine

## 2018-06-19 ENCOUNTER — Other Ambulatory Visit: Payer: Self-pay | Admitting: Internal Medicine

## 2018-06-24 ENCOUNTER — Other Ambulatory Visit: Payer: Self-pay | Admitting: Neurological Surgery

## 2018-06-24 DIAGNOSIS — M4316 Spondylolisthesis, lumbar region: Secondary | ICD-10-CM

## 2018-06-29 ENCOUNTER — Ambulatory Visit
Admission: RE | Admit: 2018-06-29 | Discharge: 2018-06-29 | Disposition: A | Discharge status: Home / Self Care | Payer: Medicare Other | Source: Ambulatory Visit | Attending: Neurological Surgery | Admitting: Neurological Surgery

## 2018-06-29 DIAGNOSIS — M4316 Spondylolisthesis, lumbar region: Secondary | ICD-10-CM

## 2018-07-12 ENCOUNTER — Other Ambulatory Visit: Payer: Self-pay | Admitting: Internal Medicine

## 2018-08-04 ENCOUNTER — Other Ambulatory Visit: Payer: Self-pay | Admitting: Internal Medicine

## 2018-08-13 ENCOUNTER — Other Ambulatory Visit: Payer: Self-pay | Admitting: Internal Medicine

## 2018-09-01 ENCOUNTER — Telehealth: Payer: Self-pay

## 2018-09-01 NOTE — Telephone Encounter (Signed)
Copied from [place] [id number]. Topic: General - Other >> Sep 01, 2018  8:32 AM Judyann Munson wrote: Reason for CRM: patient is requesting lab order for his physical schedule on 09-07-18 with Dr. Jenny Reichmann

## 2018-09-01 NOTE — Telephone Encounter (Addendum)
Relation to pt: self Call back number:  [phone] (M)  Reason for call:   Patient requesting pre visit lab orders prior to 09/07/18 physical appointment. Patient was adamant about coming in this week stating he cant come back after his physical due to work conflict. Patient states insurance will cover, please advise

## 2018-09-01 NOTE — Telephone Encounter (Signed)
Called patient and left message informing orders have already been entered.

## 2018-09-01 NOTE — Telephone Encounter (Signed)
Pt already had pending orders to be drawn in chart.   Copied from [place] [id number]. Topic: General - Other >> Sep 01, 2018  8:32 AM Judyann Munson wrote: Reason for CRM: patient is requesting lab order for his physical schedule on 09-07-18 with Dr. Jenny Reichmann

## 2018-09-02 ENCOUNTER — Other Ambulatory Visit (INDEPENDENT_AMBULATORY_CARE_PROVIDER_SITE_OTHER): Payer: Medicare Other

## 2018-09-02 DIAGNOSIS — Z0000 Encounter for general adult medical examination without abnormal findings: Secondary | ICD-10-CM

## 2018-09-02 DIAGNOSIS — R972 Elevated prostate specific antigen [PSA]: Secondary | ICD-10-CM

## 2018-09-02 LAB — CBC WITH DIFFERENTIAL/PLATELET
Basophils Absolute: 0.2 10*3/uL — ABNORMAL HIGH (ref 0.0–0.1)
Basophils Relative: 2.4 % (ref 0.0–3.0)
EOS PCT: 2.9 % (ref 0.0–5.0)
Eosinophils Absolute: 0.3 10*3/uL (ref 0.0–0.7)
HCT: 39.5 % (ref 39.0–52.0)
Hemoglobin: 13.1 g/dL (ref 13.0–17.0)
LYMPHS ABS: 2.4 10*3/uL (ref 0.7–4.0)
Lymphocytes Relative: 25.3 % (ref 12.0–46.0)
MCHC: 33.2 g/dL (ref 30.0–36.0)
MCV: 91.2 fl (ref 78.0–100.0)
MONOS PCT: 12 % (ref 3.0–12.0)
Monocytes Absolute: 1.1 10*3/uL — ABNORMAL HIGH (ref 0.1–1.0)
NEUTROS ABS: 5.4 10*3/uL (ref 1.4–7.7)
NEUTROS PCT: 57.4 % (ref 43.0–77.0)
PLATELETS: 424 10*3/uL — AB (ref 150.0–400.0)
RBC: 4.33 Mil/uL (ref 4.22–5.81)
RDW: 14.2 % (ref 11.5–15.5)
WBC: 9.4 10*3/uL (ref 4.0–10.5)

## 2018-09-02 LAB — LIPID PANEL
CHOL/HDL RATIO: 4
Cholesterol: 173 mg/dL (ref 0–200)
HDL: 42.6 mg/dL (ref >39.00)
NONHDL: 130.7
TRIGLYCERIDES: 207 mg/dL — AB (ref 0.0–149.0)
VLDL: 41.4 mg/dL — AB (ref 0.0–40.0)

## 2018-09-02 LAB — HEPATIC FUNCTION PANEL
ALBUMIN: 4 g/dL (ref 3.5–5.2)
ALT: 17 U/L (ref 0–53)
AST: 14 U/L (ref 0–37)
Alkaline Phosphatase: 133 U/L — ABNORMAL HIGH (ref 39–117)
Bilirubin, Direct: 0.1 mg/dL (ref 0.0–0.3)
Total Bilirubin: 0.3 mg/dL (ref 0.2–1.2)
Total Protein: 7 g/dL (ref 6.0–8.3)

## 2018-09-02 LAB — URINALYSIS, ROUTINE W REFLEX MICROSCOPIC
Bilirubin Urine: NEGATIVE
HGB URINE DIPSTICK: NEGATIVE
Ketones, ur: NEGATIVE
Nitrite: NEGATIVE
RBC / HPF: NONE SEEN (ref 0–?)
Total Protein, Urine: NEGATIVE
URINE GLUCOSE: NEGATIVE
Urobilinogen, UA: 0.2 (ref 0.0–1.0)
pH: 7 (ref 5.0–8.0)

## 2018-09-02 LAB — BASIC METABOLIC PANEL
BUN: 11 mg/dL (ref 6–23)
CHLORIDE: 97 meq/L (ref 96–112)
CO2: 32 meq/L (ref 19–32)
CREATININE: 1.03 mg/dL (ref 0.40–1.50)
Calcium: 9.6 mg/dL (ref 8.4–10.5)
GFR: 95.2 mL/min (ref >60.00)
Glucose, Bld: 86 mg/dL (ref 70–99)
Potassium: 3.9 mEq/L (ref 3.5–5.1)
Sodium: 134 mEq/L — ABNORMAL LOW (ref 135–145)

## 2018-09-02 LAB — LDL CHOLESTEROL, DIRECT: Direct LDL: 100 mg/dL

## 2018-09-02 LAB — PSA: PSA: 5.5 ng/mL — AB (ref 0.10–4.00)

## 2018-09-02 LAB — TSH: TSH: 2.35 u[IU]/mL (ref 0.35–4.50)

## 2018-09-03 ENCOUNTER — Telehealth: Payer: Self-pay | Admitting: Internal Medicine

## 2018-09-03 ENCOUNTER — Encounter: Payer: Self-pay | Admitting: Internal Medicine

## 2018-09-03 ENCOUNTER — Other Ambulatory Visit: Payer: Self-pay | Admitting: Internal Medicine

## 2018-09-03 DIAGNOSIS — R972 Elevated prostate specific antigen [PSA]: Secondary | ICD-10-CM

## 2018-09-03 MED ORDER — DOXYCYCLINE HYCLATE 100 MG PO TABS: 100.0000 mg | ORAL_TABLET | Freq: Two times a day (BID) | ORAL | 0 refills | Status: DC

## 2018-09-03 NOTE — Telephone Encounter (Signed)
Done erx 

## 2018-09-04 ENCOUNTER — Telehealth: Payer: Self-pay | Admitting: Internal Medicine

## 2018-09-04 ENCOUNTER — Telehealth: Payer: Self-pay

## 2018-09-04 NOTE — Telephone Encounter (Signed)
Pt returned call.  Copied from [place] [phone]. Topic: General - Other >> Sep 04, 2018 10:03 AM Juliet Rude, CMA wrote: Reason for CRM:   Discuss LAB results. Ok for [facility] to inform. >> Sep 04, 2018 10:10 AM Keene Breath wrote: Patient is returning a call regarding his labs.  NT was busy with patients.  CB# [phone]

## 2018-09-04 NOTE — Telephone Encounter (Signed)
Called pt, LVM.   CRM created.  

## 2018-09-04 NOTE — Telephone Encounter (Signed)
Please review controlled substance database form on your desk and advise. Thank you!

## 2018-09-04 NOTE — Telephone Encounter (Signed)
Bryson [facility] from [facility] is calling to request a verbal Pre authorization that she can no longer be  override due the patient insurance change.  She stated there is a conflict with this medication to HYDROcodone-acetaminophen (NORCO) 10-325 MG per tablet   That was prescribe by another provider. Best contact number is [phone].

## 2018-09-04 NOTE — Telephone Encounter (Signed)
Pt called in to have his lab results disclosed. NT was busy, advised pt he will receive a call back.

## 2018-09-04 NOTE — Telephone Encounter (Signed)
-----   Message from Biagio Borg, MD sent at 09/03/2018  1:04 PM EST ----- Left message on MyChart, pt to cont same tx except  The test results show that your current treatment is OK, except the PSA is increased.  The reason for this is not clear, but we should try to treat with an antibiotic for one month and then retest.  If the PSA is still high in 1 month, we should refer you to Urology.    Shirron to please inform pt, I will do rx and order for PSA at the lab in 1 month

## 2018-09-04 NOTE — Telephone Encounter (Signed)
Called patient back and there was no answer.  Documented call in the result note.

## 2018-09-04 NOTE — Telephone Encounter (Signed)
Ok to take the [place] with the norco, thanks

## 2018-09-07 ENCOUNTER — Encounter: Payer: Self-pay | Admitting: Internal Medicine

## 2018-09-07 ENCOUNTER — Ambulatory Visit (INDEPENDENT_AMBULATORY_CARE_PROVIDER_SITE_OTHER): Payer: Medicare Other | Admitting: Internal Medicine

## 2018-09-07 VITALS — BP 124/82 | HR 95 | Temp 97.9°F | Ht 73.0 in | Wt 168.0 lb

## 2018-09-07 DIAGNOSIS — Z23 Encounter for immunization: Secondary | ICD-10-CM

## 2018-09-07 DIAGNOSIS — Z0000 Encounter for general adult medical examination without abnormal findings: Secondary | ICD-10-CM

## 2018-09-07 DIAGNOSIS — R972 Elevated prostate specific antigen [PSA]: Secondary | ICD-10-CM | POA: Diagnosis not present

## 2018-09-07 NOTE — Telephone Encounter (Signed)
Pharmacist has been made aware.

## 2018-09-07 NOTE — Patient Instructions (Addendum)
You had the flu shot today  Please continue all other medications as before, and refills have been done if requested.  Please have the pharmacy call with any other refills you may need.  Please continue your efforts at being more active, low cholesterol diet, and weight control.  You are otherwise up to date with prevention measures today.  Please keep your appointments with your specialists as you may have planned  Please return for the PSA to the LAB ONLY in 4 weeks  Please return in 1 year for your yearly visit, or sooner if needed, with Lab testing done 3-5 days before

## 2018-09-07 NOTE — Assessment & Plan Note (Signed)
For doxy x 1 mo course, then recheck

## 2018-09-07 NOTE — Assessment & Plan Note (Signed)

## 2018-09-07 NOTE — Progress Notes (Signed)
Subjective:    Patient ID: Mark Dunlap, male    DOB: 1961-05-19, 57 y.o.   MRN: [id number]  HPI  Here for wellness and f/u;  Overall doing ok;  Pt denies Chest pain, worsening SOB, DOE, wheezing, orthopnea, PND, worsening LE edema, palpitations, dizziness or syncope.  Pt denies neurological change such as new headache, facial or extremity weakness.  Pt denies polydipsia, polyuria, or low sugar symptoms. Pt states overall good compliance with treatment and medications, good tolerability, and has been trying to follow appropriate diet.  Pt denies worsening depressive symptoms, suicidal ideation or panic. No fever, night sweats, wt loss, loss of appetite, or other constitutional symptoms.  Pt states good ability with ADL's, has low fall risk, home safety reviewed and adequate, no other significant changes in hearing or vision, and only occasionally active with exercise.  Denies urinary symptoms such as dysuria, frequency, urgency, flank pain, hematuria or n/v, fever, chills.  No other new complaints Past Medical History:  Diagnosis Date  . Acute prostatitis 05/23/2009  . Adjustment disorder with depressed mood 09/2007  . CERVICAL RADICULOPATHY, LEFT 08/30/2008  . DJD (degenerative joint disease) of hip   . DJD (degenerative joint disease) of knee    left knee  . GERD (gastroesophageal reflux disease)   . 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 H/O: substance abuse ([place])    hx of ETOH/Crack cocaine-none since 11/08 per pt/Does not Drive due to this  . HYPERTENSION 08/30/2008  . HYPERTHYROIDISM 02/13/2010   pt was told by  Dr Jenny Reichmann  that thyroid was now back to normal .. 2012 ...   . HYPOTENSION 10/31/2009  . 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 Other and unspecified hyperlipidemia 08/20/2013  . PONV (postoperative nausea and vomiting)   . Seasonal allergies   . SPINAL STENOSIS, CERVICAL  08/08/2009  . THYROID NODULE, RIGHT 03/02/2010   Past Surgical History:  Procedure Laterality Date  . ANTERIOR CERVICAL DECOMP/DISCECTOMY FUSION  10/11/2011   Procedure: ANTERIOR CERVICAL DECOMPRESSION/DISCECTOMY FUSION 3 LEVELS;  Surgeon: Eustace Moore;  Location: [facility];  Service: Neurosurgery;  Laterality: N/A;  Cervical three-four ,cervical four five cervical five six Anterior Cervical Decompression Fusion with peek + plate Nuvasive translational plate Orthofix peek (2 1/2 hours) Rm # 32  . ANTERIOR CERVICAL DECOMP/DISCECTOMY FUSION N/A 07/29/2014   Procedure: ANTERIOR CERVICAL DECOMPRESSION/DISCECTOMY FUSION 1 LEVEL/HARDWARE REMOVAL;  Surgeon: Eustace Moore, MD;  Location: [facility];  Service: Neurosurgery;  Laterality: N/A;  cervical six-seven  . POSTERIOR CERVICAL FUSION/FORAMINOTOMY N/A 09/23/2013   Procedure: CERVICALTWO TO CERVICAL SEVEN POSTERIOR CERVICAL FUSION/FORAMINOTOMY WITH LATERAL MASS FIXATION;  Surgeon: Eustace Moore, MD;  Location: [facility];  Service: Neurosurgery;  Laterality: N/A;  . Right Hand I&D  12/07   s/p rdue to abscess-Dr. Amedeo Plenty  . TOTAL HIP ARTHROPLASTY  12/17/2011   Procedure: TOTAL HIP ARTHROPLASTY;  Surgeon: Johnny Bridge, MD;  Location: [facility];  Service: Orthopedics;  Laterality: Left;    reports that he has been smoking cigarettes. He has a 20.00 pack-year smoking history. He has never used smokeless tobacco. He reports that he has current or past drug history. Drug: "Crack" cocaine. He reports that he does not drink alcohol. family history includes Alcohol abuse in his brother, cousin, and father; Arthritis in his mother; Diabetes in his brother and  sister; Goiter in his mother and sister; Heart disease in his father and other; Hyperlipidemia in his mother; Hypertension in his sister; Stroke in his father. Allergies  Allergen Reactions  . Hydrocodone Itching and Other (See Comments)    Pt can tolerate  . Tramadol Itching  . Morphine And  Related Itching  . Oxycodone Itching   Current Outpatient Medications on File Prior to Visit  Medication Sig Dispense Refill  . acetaminophen (TYLENOL) 500 MG tablet Take 1,000 mg by mouth every 6 (six) hours as needed for moderate pain or headache.    [name] adapalene (DIFFERIN) 0.1 % gel APPLY 1 APPLICATION TOPICALLY AT BEDTIME. 45 g 1  . aspirin 81 MG tablet Take 1 tablet (81 mg total) by mouth daily. 30 tablet   . benzoyl peroxide (BENZOYL PEROXIDE) 5 % external liquid Apply 1 application topically 2 (two) times daily. 226 g 0  . bimatoprost (LUMIGAN) 0.01 % SOLN Place 1 drop into both eyes at bedtime.    . carboxymethylcellulose (REFRESH PLUS) 0.5 % SOLN Place 1 drop into both eyes 3 (three) times daily as needed (for clear eyes).     . diphenhydrAMINE (BENADRYL) 25 mg capsule Take 50 mg by mouth every 6 (six) hours as needed for itching.     [name] doxycycline (VIBRA-TABS) 100 MG tablet Take 1 tablet (100 mg total) by mouth 2 (two) times daily. 60 tablet 0  . fluticasone (FLONASE) 50 MCG/ACT nasal spray USE 2 SPRAYS DAILY IN BOTH NOSTRILS 16 g 1  . gabapentin (NEURONTIN) 100 MG capsule Take 200 mg by mouth 2 (two) times daily.  2  . hydrochlorothiazide (MICROZIDE) 12.5 MG capsule TAKE 1 CAPSULE (12.5 MG TOTAL) DAILY BY MOUTH. 90 capsule 1  . HYDROcodone-acetaminophen (NORCO) 10-325 MG per tablet Take 1 tablet by mouth every 6 (six) hours as needed for moderate pain. 60 tablet 0  . imiquimod (ALDARA) 5 % cream Apply topically 3 (three) times a week. 12 each 0  . Ketorolac Tromethamine (SPRIX) 15.75 MG/SPRAY SOLN Place 1 spray into the nose every 6 (six) hours as needed (for pain).    . nortriptyline (PAMELOR) 25 MG capsule Take 25 mg by mouth at bedtime.  0  . ondansetron (ZOFRAN) 4 MG tablet TAKE 1 TABLET (4 MG TOTAL) BY MOUTH EVERY 8 (EIGHT) HOURS AS NEEDED FOR NAUSEA OR VOMITING. 20 tablet 2  . oxybutynin (DITROPAN) 5 MG tablet Take 5 mg by mouth at bedtime.  11  . pantoprazole (PROTONIX) 40 MG  tablet TAKE 1 TABLET (40 MG TOTAL) BY MOUTH DAILY. 90 tablet 3  . ranitidine (ZANTAC) 150 MG tablet TAKE 1 TABLET (150 MG TOTAL) 2 (TWO) TIMES DAILY BY MOUTH. 180 tablet 3  . tamsulosin (FLOMAX) 0.4 MG CAPS capsule TAKE 1 CAPSULE (0.4 MG TOTAL) BY MOUTH twice daily 180 capsule 3  . timolol (TIMOPTIC) 0.5 % ophthalmic solution Place 1 drop into both eyes daily.  6  . tiZANidine (ZANAFLEX) 4 MG capsule TAKE 1 CAPSULE BY ORAL ROUTE 3 TIMES EVERY DAY 60 capsule 2  . tretinoin (RETIN-A) 0.1 % cream APPLY TO AFFECTED AREA AT BEDTIME TO FACE 45 g 2  . zolpidem (AMBIEN) 10 MG tablet TAKE 1 TABLET AT BEDTIME AS NEEDED FOR SLEEP 90 tablet 1   No current facility-administered medications on file prior to visit.    Review of Systems Constitutional: Negative for other unusual diaphoresis, sweats, appetite or weight changes HENT: Negative for other worsening hearing loss, ear pain, facial swelling, mouth  sores or neck stiffness.   Eyes: Negative for other worsening pain, redness or other visual disturbance.  Respiratory: Negative for other stridor or swelling Cardiovascular: Negative for other palpitations or other chest pain  Gastrointestinal: Negative for worsening diarrhea or loose stools, blood in stool, distention or other pain Genitourinary: Negative for hematuria, flank pain or other change in urine volume.  Musculoskeletal: Negative for myalgias or other joint swelling.  Skin: Negative for other color change, or other wound or worsening drainage.  Neurological: Negative for other syncope or numbness. Hematological: Negative for other adenopathy or swelling Psychiatric/Behavioral: Negative for hallucinations, other worsening agitation, SI, self-injury, or new decreased concentration All other system neg per pt    Objective:   Physical Exam BP 124/82   Pulse 95   Temp 97.9 F (36.6 C) (Oral)   Ht 6\' 1"  (1.854 m)   Wt 168 lb (76.2 kg)   SpO2 99%   BMI 22.16 kg/m  VS noted,    Constitutional: Pt is oriented to person, place, and time. Appears well-developed and well-nourished, in no significant distress and comfortable Head: Normocephalic and atraumatic  Eyes: Conjunctivae and EOM are normal. Pupils are equal, round, and reactive to light Right Ear: External ear normal without discharge Left Ear: External ear normal without discharge Nose: Nose without discharge or deformity Mouth/Throat: Oropharynx is without other ulcerations and moist  Neck: Normal range of motion. Neck supple. No JVD present. No tracheal deviation present or significant neck LA or mass Cardiovascular: Normal rate, regular rhythm, normal heart sounds and intact distal pulses.   Pulmonary/Chest: WOB normal and breath sounds without rales or wheezing  Abdominal: Soft. Bowel sounds are normal. NT. No HSM  Musculoskeletal: Normal range of motion. Exhibits no edema Lymphadenopathy: Has no other cervical adenopathy.  Neurological: Pt is alert and oriented to person, place, and time. Pt has normal reflexes. No cranial nerve deficit. Motor grossly intact, Gait intact Skin: Skin is warm and dry. No rash noted or new ulcerations Psychiatric:  Has normal mood and affect. Behavior is normal without agitation No other exam findings Lab Results  Component Value Date   WBC 9.4 09/02/2018   HGB 13.1 09/02/2018   HCT 39.5 09/02/2018   PLT 424.0 (H) 09/02/2018   GLUCOSE 86 09/02/2018   CHOL 173 09/02/2018   TRIG 207.0 (H) 09/02/2018   HDL 42.60 09/02/2018   LDLDIRECT 100.0 09/02/2018   LDLCALC 85 08/08/2017   ALT 17 09/02/2018   AST 14 09/02/2018   NA 134 (L) 09/02/2018   K 3.9 09/02/2018   CL 97 09/02/2018   CREATININE 1.03 09/02/2018   BUN 11 09/02/2018   CO2 32 09/02/2018   TSH 2.35 09/02/2018   PSA 5.50 (H) 09/02/2018   INR 0.96 07/22/2014       Assessment & Plan:

## 2018-09-26 ENCOUNTER — Other Ambulatory Visit: Payer: Self-pay | Admitting: Internal Medicine

## 2018-10-20 ENCOUNTER — Other Ambulatory Visit (INDEPENDENT_AMBULATORY_CARE_PROVIDER_SITE_OTHER): Payer: Medicare Other

## 2018-10-20 ENCOUNTER — Ambulatory Visit: Payer: Medicare Other | Admitting: Internal Medicine

## 2018-10-20 ENCOUNTER — Other Ambulatory Visit: Payer: Self-pay | Admitting: Internal Medicine

## 2018-10-20 ENCOUNTER — Encounter: Payer: Self-pay | Admitting: Internal Medicine

## 2018-10-20 VITALS — BP 132/84 | HR 84 | Temp 98.0°F | Ht 73.0 in | Wt 166.0 lb

## 2018-10-20 DIAGNOSIS — R972 Elevated prostate specific antigen [PSA]: Secondary | ICD-10-CM

## 2018-10-20 DIAGNOSIS — K219 Gastro-esophageal reflux disease without esophagitis: Secondary | ICD-10-CM

## 2018-10-20 DIAGNOSIS — I10 Essential (primary) hypertension: Secondary | ICD-10-CM | POA: Diagnosis not present

## 2018-10-20 LAB — PSA: PSA: 6.11 ng/mL — ABNORMAL HIGH (ref 0.10–4.00)

## 2018-10-20 NOTE — Progress Notes (Signed)
Subjective:    Patient ID: Mark Dunlap, male    DOB: Mar 07, 1961, 58 y.o.   MRN: [id number]  HPI   Here to f/u; overall doing ok,  Pt denies chest pain, increasing sob or doe, wheezing, orthopnea, PND, increased LE swelling, palpitations, dizziness or syncope.  Pt denies new neurological symptoms such as new headache, or facial or extremity weakness or numbness.  Pt denies polydipsia, polyuria  Pt states overall good compliance with meds, mostly trying to follow appropriate diet, with wt overall stable,. Denies worsening dysphagia, n/v, bowel change or blood, but has persistent epigastric discomfort and burning in the lower mid chest, assoc sometimes with belching.  Pain is nonpositional, nonexertional, nonpleuritic.  Ongoing intermittent mod for 2-3 wks.  Asks for GI referral as he has good compliance with meds.  Also denies urinary symptoms such as dysuria, frequency, urgency, flank pain, hematuria or n/v, fever, chills. Past Medical History:  Diagnosis Date  . Acute prostatitis 05/23/2009  . Adjustment disorder with depressed mood 09/2007  . CERVICAL RADICULOPATHY, LEFT 08/30/2008  . DJD (degenerative joint disease) of hip   . DJD (degenerative joint disease) of knee    left knee  . GERD (gastroesophageal reflux disease)   . 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 H/O: substance abuse ([place])    hx of ETOH/Crack cocaine-none since 11/08 per pt/Does not Drive due to this  . HYPERTENSION 08/30/2008  . HYPERTHYROIDISM 02/13/2010   pt was told by  Dr Jenny Reichmann  that thyroid was now back to normal .. 2012 ...   . HYPOTENSION 10/31/2009  . 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 Other and unspecified hyperlipidemia 08/20/2013  . PONV (postoperative nausea and vomiting)   . Seasonal allergies   . SPINAL STENOSIS, CERVICAL 08/08/2009  . THYROID NODULE, RIGHT 03/02/2010   Past Surgical History:    Procedure Laterality Date  . ANTERIOR CERVICAL DECOMP/DISCECTOMY FUSION  10/11/2011   Procedure: ANTERIOR CERVICAL DECOMPRESSION/DISCECTOMY FUSION 3 LEVELS;  Surgeon: Eustace Moore;  Location: [facility];  Service: Neurosurgery;  Laterality: N/A;  Cervical three-four ,cervical four five cervical five six Anterior Cervical Decompression Fusion with peek + plate Nuvasive translational plate Orthofix peek (2 1/2 hours) Rm # 32  . ANTERIOR CERVICAL DECOMP/DISCECTOMY FUSION N/A 07/29/2014   Procedure: ANTERIOR CERVICAL DECOMPRESSION/DISCECTOMY FUSION 1 LEVEL/HARDWARE REMOVAL;  Surgeon: Eustace Moore, MD;  Location: [facility];  Service: Neurosurgery;  Laterality: N/A;  cervical six-seven  . POSTERIOR CERVICAL FUSION/FORAMINOTOMY N/A 09/23/2013   Procedure: CERVICALTWO TO CERVICAL SEVEN POSTERIOR CERVICAL FUSION/FORAMINOTOMY WITH LATERAL MASS FIXATION;  Surgeon: Eustace Moore, MD;  Location: [facility];  Service: Neurosurgery;  Laterality: N/A;  . Right Hand I&D  12/07   s/p rdue to abscess-Dr. Amedeo Plenty  . TOTAL HIP ARTHROPLASTY  12/17/2011   Procedure: TOTAL HIP ARTHROPLASTY;  Surgeon: Johnny Bridge, MD;  Location: [facility];  Service: Orthopedics;  Laterality: Left;    reports that he has been smoking cigarettes. He has a 20.00 pack-year smoking history. He has never used smokeless tobacco. He reports current drug use. Drug: "Crack" cocaine. He reports that he does not drink alcohol. family history includes Alcohol abuse in his brother, cousin, and father; Arthritis in his mother; Diabetes in his brother and sister; Goiter in his mother and sister; Heart disease in his father and another  family member; Hyperlipidemia in his mother; Hypertension in his sister; Stroke in his father. Allergies  Allergen Reactions  . Hydrocodone Itching and Other (See Comments)    Pt can tolerate  . Tramadol Itching  . Morphine And Related Itching  . Oxycodone Itching   Current Outpatient Medications on File  Prior to Visit  Medication Sig Dispense Refill  . acetaminophen (TYLENOL) 500 MG tablet Take 1,000 mg by mouth every 6 (six) hours as needed for moderate pain or headache.    [name] adapalene (DIFFERIN) 0.1 % gel APPLY 1 APPLICATION TOPICALLY AT BEDTIME. 45 g 1  . aspirin 81 MG tablet Take 1 tablet (81 mg total) by mouth daily. 30 tablet   . benzoyl peroxide (BENZOYL PEROXIDE) 5 % external liquid Apply 1 application topically 2 (two) times daily. 226 g 0  . bimatoprost (LUMIGAN) 0.01 % SOLN Place 1 drop into both eyes at bedtime.    . carboxymethylcellulose (REFRESH PLUS) 0.5 % SOLN Place 1 drop into both eyes 3 (three) times daily as needed (for clear eyes).     . diphenhydrAMINE (BENADRYL) 25 mg capsule Take 50 mg by mouth every 6 (six) hours as needed for itching.     . fluticasone (FLONASE) 50 MCG/ACT nasal spray USE 2 SPRAYS DAILY IN BOTH NOSTRILS 16 g 1  . gabapentin (NEURONTIN) 100 MG capsule Take 200 mg by mouth 2 (two) times daily.  2  . hydrochlorothiazide (MICROZIDE) 12.5 MG capsule TAKE 1 CAPSULE (12.5 MG TOTAL) DAILY BY MOUTH. 90 capsule 1  . HYDROcodone-acetaminophen (NORCO) 10-325 MG per tablet Take 1 tablet by mouth every 6 (six) hours as needed for moderate pain. 60 tablet 0  . imiquimod (ALDARA) 5 % cream Apply topically 3 (three) times a week. 12 each 0  . Ketorolac Tromethamine (SPRIX) 15.75 MG/SPRAY SOLN Place 1 spray into the nose every 6 (six) hours as needed (for pain).    . nortriptyline (PAMELOR) 25 MG capsule Take 25 mg by mouth at bedtime.  0  . ondansetron (ZOFRAN) 4 MG tablet TAKE 1 TABLET (4 MG TOTAL) BY MOUTH EVERY 8 (EIGHT) HOURS AS NEEDED FOR NAUSEA OR VOMITING. 20 tablet 2  . oxybutynin (DITROPAN) 5 MG tablet Take 5 mg by mouth at bedtime.  11  . pantoprazole (PROTONIX) 40 MG tablet TAKE 1 TABLET (40 MG TOTAL) BY MOUTH DAILY. 90 tablet 3  . ranitidine (ZANTAC) 150 MG tablet TAKE 1 TABLET (150 MG TOTAL) 2 (TWO) TIMES DAILY BY MOUTH. 180 tablet 3  . tamsulosin (FLOMAX)  0.4 MG CAPS capsule TAKE 1 CAPSULE (0.4 MG TOTAL) BY MOUTH twice daily 180 capsule 3  . timolol (TIMOPTIC) 0.5 % ophthalmic solution Place 1 drop into both eyes daily.  6  . tiZANidine (ZANAFLEX) 4 MG capsule TAKE 1 CAPSULE BY ORAL ROUTE 3 TIMES EVERY DAY 60 capsule 2  . tretinoin (RETIN-A) 0.1 % cream APPLY TO AFFECTED AREA AT BEDTIME TO FACE 45 g 2  . zolpidem (AMBIEN) 10 MG tablet TAKE 1 TABLET AT BEDTIME AS NEEDED FOR SLEEP 90 tablet 1   No current facility-administered medications on file prior to visit.    Review of Systems  Constitutional: Negative for other unusual diaphoresis or sweats HENT: Negative for ear discharge or swelling Eyes: Negative for other worsening visual disturbances Respiratory: Negative for stridor or other swelling  Gastrointestinal: Negative for worsening distension or other blood Genitourinary: Negative for retention or other urinary change Musculoskeletal: Negative for other MSK pain or swelling Skin: Negative  for color change or other new lesions Neurological: Negative for worsening tremors and other numbness  Psychiatric/Behavioral: Negative for worsening agitation or other fatigue ALl other system neg per pt    Objective:   Physical Exam BP 132/84   Pulse 84   Temp 98 F (36.7 C) (Oral)   Ht 6\' 1"  (1.854 m)   Wt 166 lb (75.3 kg)   SpO2 97%   BMI 21.90 kg/m  VS noted,  Constitutional: Pt appears in NAD HENT: Head: NCAT.  Right Ear: External ear normal.  Left Ear: External ear normal.  Eyes: . Pupils are equal, round, and reactive to light. Conjunctivae and EOM are normal Nose: without d/c or deformity Neck: Neck supple. Gross normal ROM Cardiovascular: Normal rate and regular rhythm.   Pulmonary/Chest: Effort normal and breath sounds without rales or wheezing.  Abd:  Soft, NT, ND, + BS, no organomegaly Neurological: Pt is alert. At baseline orientation, motor grossly intact Skin: Skin is warm. No rashes, other new lesions, no LE  edema Psychiatric: Pt behavior is normal without agitation  No other exam findings Lab Results  Component Value Date   WBC 9.4 09/02/2018   HGB 13.1 09/02/2018   HCT 39.5 09/02/2018   PLT 424.0 (H) 09/02/2018   GLUCOSE 86 09/02/2018   CHOL 173 09/02/2018   TRIG 207.0 (H) 09/02/2018   HDL 42.60 09/02/2018   LDLDIRECT 100.0 09/02/2018   LDLCALC 85 08/08/2017   ALT 17 09/02/2018   AST 14 09/02/2018   NA 134 (L) 09/02/2018   K 3.9 09/02/2018   CL 97 09/02/2018   CREATININE 1.03 09/02/2018   BUN 11 09/02/2018   CO2 32 09/02/2018   TSH 2.35 09/02/2018   PSA 6.11 (H) 10/20/2018   INR 0.96 07/22/2014          Assessment & Plan:

## 2018-10-20 NOTE — Assessment & Plan Note (Signed)
For f/u psa today as he has finished his one month doxycyline,  to f/u any worsening symptoms or concerns

## 2018-10-20 NOTE — Patient Instructions (Signed)
Please continue all other medications as before, and refills have been done if requested.  Please have the pharmacy call with any other refills you may need.  Please continue your efforts at being more active, low cholesterol diet, and weight control.  You are otherwise up to date with prevention measures today.  Please keep your appointments with your specialists as you may have planned  You will be contacted regarding the referral for: Gastroenterology  Please go to the LAB in the Basement (turn left off the elevator) for the tests to be done today - just the PSA blood test  You will be contacted by phone if any changes need to be made immediately.  Otherwise, you will receive a letter about your results with an explanation, but please check with MyChart first.  Please remember to sign up for MyChart if you have not done so, as this will be important to you in the future with finding out test results, communicating by private email, and scheduling acute appointments online when needed.

## 2018-10-20 NOTE — Assessment & Plan Note (Signed)
With some odynophagia, cont current tx, refer GI for possible egd

## 2018-10-20 NOTE — Assessment & Plan Note (Signed)
stable overall by history and exam, recent data reviewed with pt, and pt to continue medical treatment as before,  to f/u any worsening symptoms or concerns  

## 2018-10-21 ENCOUNTER — Other Ambulatory Visit: Payer: Self-pay | Admitting: Internal Medicine

## 2018-10-21 ENCOUNTER — Telehealth: Payer: Self-pay

## 2018-10-21 NOTE — Telephone Encounter (Signed)
Called pt, LVM.   CRM created.  

## 2018-10-21 NOTE — Telephone Encounter (Signed)
-----   Message from Biagio Borg, MD sent at 10/20/2018  9:42 PM EST ----- Left message on MyChart, pt to cont same tx except  The test results show that your current treatment is OK, except the PSA is increased despite the antibiotic treatment.  We need to refer you to Urology for this, to determine any chance of Prostate Cancer.  \\  Shirron to please inform pt, I will do referral

## 2018-10-23 ENCOUNTER — Ambulatory Visit: Payer: Medicare Other | Admitting: Physician Assistant

## 2018-10-23 ENCOUNTER — Other Ambulatory Visit: Payer: Self-pay | Admitting: *Deleted

## 2018-10-23 ENCOUNTER — Encounter: Payer: Self-pay | Admitting: Physician Assistant

## 2018-10-23 VITALS — BP 140/80 | HR 76 | Ht 73.0 in | Wt 168.0 lb

## 2018-10-23 DIAGNOSIS — K219 Gastro-esophageal reflux disease without esophagitis: Secondary | ICD-10-CM

## 2018-10-23 DIAGNOSIS — R1013 Epigastric pain: Secondary | ICD-10-CM | POA: Diagnosis not present

## 2018-10-23 MED ORDER — AMBULATORY NON FORMULARY MEDICATION: 1 refills | Status: DC

## 2018-10-23 NOTE — Progress Notes (Signed)
Reviewed and agree with initial management plan.  Eschol Auxier T. Basya Casavant, MD FACG 

## 2018-10-23 NOTE — Progress Notes (Signed)
Chief Complaint: GERD  HPI:    Mark Dunlap is a 58 year old African-American male with a past medical history as listed below, known to Dr. Fuller Plan for colonoscopy, who was referred to me by Biagio Borg, MD for a complaint of GERD.      04/28/2015 colonoscopy was normal.  Repeat recommended in 5 years given personal history of adenomatous polyps in the past.    Today, patient presents clinic and explains that for the past year he has had uncontrolled reflux.  Initially started on a PPI twice daily.  Could not afford Pantoprazole twice daily so was taking his Ranitidine 150 mg and Pantoprazole 40 mg in the morning and then taking Nexium 40 mg and Ranitidine 150 mg before dinner.  Does tell me that if he has breakthrough symptoms throughout the day he will chug down a ginger ale and may be 2 other sodas in order to get a burp out which is somewhat of a relief to him.  Also loves to eat hot wings and mostly fried foods.  Associated symptoms include epigastric pain.    Denies weight loss, anorexia, nausea, vomiting, change in bowel habits or symptoms that awaken him from sleep.  Past Medical History:  Diagnosis Date  . Acute prostatitis 05/23/2009  . Adjustment disorder with depressed mood 09/2007  . CERVICAL RADICULOPATHY, LEFT 08/30/2008  . DJD (degenerative joint disease) of hip   . DJD (degenerative joint disease) of knee    left knee  . GERD (gastroesophageal reflux disease)   . 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 H/O: substance abuse ([place])    hx of ETOH/Crack cocaine-none since 11/08 per pt/Does not Drive due to this  . HYPERTENSION 08/30/2008  . HYPERTHYROIDISM 02/13/2010   pt was told by  Dr Jenny Reichmann  that thyroid was now back to normal .. 2012 ...   . HYPOTENSION 10/31/2009  . 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 Other and unspecified hyperlipidemia 08/20/2013  . PONV (postoperative  nausea and vomiting)   . Seasonal allergies   . SPINAL STENOSIS, CERVICAL 08/08/2009  . THYROID NODULE, RIGHT 03/02/2010    Past Surgical History:  Procedure Laterality Date  . ANTERIOR CERVICAL DECOMP/DISCECTOMY FUSION  10/11/2011   Procedure: ANTERIOR CERVICAL DECOMPRESSION/DISCECTOMY FUSION 3 LEVELS;  Surgeon: Eustace Moore;  Location: [place] [facility];  Service: Neurosurgery;  Laterality: N/A;  Cervical three-four ,cervical four five cervical five six Anterior Cervical Decompression Fusion with peek + plate Nuvasive translational plate Orthofix peek (2 1/2 hours) Rm # [id number]  . ANTERIOR CERVICAL DECOMP/DISCECTOMY FUSION N/A 07/29/2014   Procedure: ANTERIOR CERVICAL DECOMPRESSION/DISCECTOMY FUSION 1 LEVEL/HARDWARE REMOVAL;  Surgeon: Eustace Moore, MD;  Location: [place] [facility];  Service: Neurosurgery;  Laterality: N/A;  cervical six-seven  . POSTERIOR CERVICAL FUSION/FORAMINOTOMY N/A 09/23/2013   Procedure: CERVICALTWO TO CERVICAL SEVEN POSTERIOR CERVICAL FUSION/FORAMINOTOMY WITH LATERAL MASS FIXATION;  Surgeon: Eustace Moore, MD;  Location: [place] [facility];  Service: Neurosurgery;  Laterality: N/A;  . Right Hand I&D  12/07   s/p rdue to abscess-Dr. Amedeo Plenty  . TOTAL HIP ARTHROPLASTY  12/17/2011   Procedure: TOTAL HIP ARTHROPLASTY;  Surgeon: Johnny Bridge, MD;  Location: [facility];  Service: Orthopedics;  Laterality: Left;    Current Outpatient Medications  Medication Sig Dispense Refill  . acetaminophen (TYLENOL) 500 MG tablet Take 1,000 mg by  mouth every 6 (six) hours as needed for moderate pain or headache.    [name] adapalene (DIFFERIN) 0.1 % gel APPLY 1 APPLICATION TOPICALLY AT BEDTIME. 45 g 1  . benzoyl peroxide (BENZOYL PEROXIDE) 5 % external liquid Apply 1 application topically 2 (two) times daily. 226 g 0  . bimatoprost (LUMIGAN) 0.01 % SOLN Place 1 drop into both eyes at bedtime.    . carboxymethylcellulose (REFRESH PLUS) 0.5 % SOLN Place 1 drop into both eyes 3 (three) times daily as needed (for  clear eyes).     . diphenhydrAMINE (BENADRYL) 25 mg capsule Take 50 mg by mouth every 6 (six) hours as needed for itching.     [name] doxycycline (VIBRA-TABS) 100 MG tablet TAKE 1 TABLET BY MOUTH TWICE A DAY 60 tablet 0  . fluticasone (FLONASE) 50 MCG/ACT nasal spray USE 2 SPRAYS DAILY IN BOTH NOSTRILS 16 g 1  . gabapentin (NEURONTIN) 100 MG capsule Take 200 mg by mouth 2 (two) times daily.  2  . hydrochlorothiazide (MICROZIDE) 12.5 MG capsule TAKE 1 CAPSULE (12.5 MG TOTAL) DAILY BY MOUTH. 90 capsule 1  . HYDROcodone-acetaminophen (NORCO) 10-325 MG per tablet Take 1 tablet by mouth every 6 (six) hours as needed for moderate pain. 60 tablet 0  . imiquimod (ALDARA) 5 % cream Apply topically 3 (three) times a week. 12 each 0  . Ketorolac Tromethamine (SPRIX) 15.75 MG/SPRAY SOLN Place 1 spray into the nose every 6 (six) hours as needed (for pain).    . nortriptyline (PAMELOR) 25 MG capsule Take 25 mg by mouth at bedtime.  0  . ondansetron (ZOFRAN) 4 MG tablet TAKE 1 TABLET (4 MG TOTAL) BY MOUTH EVERY 8 (EIGHT) HOURS AS NEEDED FOR NAUSEA OR VOMITING. 20 tablet 2  . oxybutynin (DITROPAN) 5 MG tablet Take 5 mg by mouth at bedtime.  11  . pantoprazole (PROTONIX) 40 MG tablet TAKE 1 TABLET (40 MG TOTAL) BY MOUTH DAILY. 90 tablet 3  . ranitidine (ZANTAC) 150 MG tablet TAKE 1 TABLET (150 MG TOTAL) 2 (TWO) TIMES DAILY BY MOUTH. 180 tablet 3  . tamsulosin (FLOMAX) 0.4 MG CAPS capsule TAKE 1 CAPSULE (0.4 MG TOTAL) BY MOUTH twice daily 180 capsule 3  . timolol (TIMOPTIC) 0.5 % ophthalmic solution Place 1 drop into both eyes daily.  6  . tiZANidine (ZANAFLEX) 4 MG capsule TAKE 1 CAPSULE BY ORAL ROUTE 3 TIMES EVERY DAY 60 capsule 2  . tretinoin (RETIN-A) 0.1 % cream APPLY TO AFFECTED AREA AT BEDTIME TO FACE 45 g 2  . zolpidem (AMBIEN) 10 MG tablet TAKE 1 TABLET AT BEDTIME AS NEEDED FOR SLEEP 90 tablet 1   No current facility-administered medications for this visit.     Allergies as of 10/23/2018 - Review Complete  10/23/2018  Allergen Reaction Noted  . Hydrocodone Itching and Other (See Comments) 08/18/2013  . Tramadol Itching 10/03/2011  . Morphine and related Itching 12/06/2011  . Oxycodone Itching 11/20/2017    Family History  Problem Relation Age of Onset  . Alcohol abuse Father   . Stroke Father   . Heart disease Father   . Arthritis Mother   . Hyperlipidemia Mother   . Goiter Mother        resection of benign goiter  . Hypertension Sister   . Diabetes Sister   . Goiter Sister        resection of benign goiter  . Diabetes Brother   . Alcohol abuse Brother   . Alcohol abuse Cousin   .  Heart disease Other        Aunt  . Colon cancer Neg Hx     Social History   Socioeconomic History  . Marital status: Single    Spouse name: Not on file  . Number of children: 0  . Years of education: Not on file  . Highest education level: Not on file  Occupational History  . Occupation: Industries for the [employer]  . Financial resource strain: Not hard at all  . Food insecurity:    Worry: Never true    Inability: Never true  . Transportation needs:    Medical: No    Non-medical: No  Tobacco Use  . Smoking status: Current Every Day Smoker    Packs/day: 0.50    Years: 40.00    Pack years: 20.00    Types: Cigarettes  . Smokeless tobacco: Never Used  . Tobacco comment: last crack over 1 yr   alcohol last time few yrs. States today is his quit date for smoking   Substance and Sexual Activity  . Alcohol use: No    Alcohol/week: 0.0 standard drinks    Comment: hx of alcohol abuse  . Drug use: Yes    Types: "Crack" cocaine    Comment: history of, " clean for 24 MONTHS"  . Sexual activity: Not Currently  Lifestyle  . Physical activity:    Days per week: 0 days    Minutes per session: 0 min  . Stress: Rather much  Relationships  . Social connections:    Talks on phone: More than three times a week    Gets together: More than three times a week    Attends religious  service: More than 4 times per year    Active member of club or organization: Yes    Attends meetings of clubs or organizations: More than 4 times per year    Relationship status: Never married  . Intimate partner violence:    Fear of current or ex partner: Not on file    Emotionally abused: Not on file    Physically abused: Not on file    Forced sexual activity: Not on file  Other Topics Concern  . Not on file  Social History Narrative  . Not on file    Review of Systems:    Constitutional: No weight loss, fever or chills Skin: No rash Cardiovascular: No chest pain Respiratory: No SOB Gastrointestinal: See HPI and otherwise negative Genitourinary: No dysuria or change in urinary frequency Neurological: No headache Musculoskeletal: No new muscle or joint pain Hematologic: No bleeding  Psychiatric: No history of depression or anxiety   Physical Exam:  Vital signs: BP 140/80   Pulse 76   Ht 6\' 1"  (1.854 m)   Wt 168 lb (76.2 kg)   BMI 22.16 kg/m   Constitutional:   Pleasant AA male appears to be in NAD, Well developed, Well nourished, alert and cooperative Head:  Normocephalic and atraumatic. Eyes:   PEERL, EOMI. No icterus. Conjunctiva pink. Ears:  Normal auditory acuity. Neck:  Supple Throat: Oral cavity and pharynx without inflammation, swelling or lesion.  Respiratory: Respirations even and unlabored. Lungs clear to auscultation bilaterally.   No wheezes, crackles, or rhonchi.  Cardiovascular: Normal S1, S2. No MRG. Regular rate and rhythm. No peripheral edema, cyanosis or pallor.  Gastrointestinal:  Soft, nondistended, mild epigastric ttp. No rebound or guarding. Normal bowel sounds. No appreciable masses or hepatomegaly. Rectal:  Not performed.  Msk:  Symmetrical without  gross deformities. Without edema, no deformity or joint abnormality.  Neurologic:  Alert and  oriented x4;  grossly normal neurologically.  Skin:   Dry and intact without significant lesions or  rashes. Psychiatric: Demonstrates good judgement and reason without abnormal affect or behaviors.  MOST RECENT LABS AND IMAGING: CBC    Component Value Date/Time   WBC 9.4 09/02/2018 1623   RBC 4.33 09/02/2018 1623   HGB 13.1 09/02/2018 1623   HCT 39.5 09/02/2018 1623   PLT 424.0 (H) 09/02/2018 1623   MCV 91.2 09/02/2018 1623   MCH 29.8 11/21/2017 1541   MCHC 33.2 09/02/2018 1623   RDW 14.2 09/02/2018 1623   LYMPHSABS 2.4 09/02/2018 1623   MONOABS 1.1 (H) 09/02/2018 1623   EOSABS 0.3 09/02/2018 1623   BASOSABS 0.2 (H) 09/02/2018 1623    CMP     Component Value Date/Time   NA 134 (L) 09/02/2018 1623   K 3.9 09/02/2018 1623   CL 97 09/02/2018 1623   CO2 32 09/02/2018 1623   GLUCOSE 86 09/02/2018 1623   BUN 11 09/02/2018 1623   CREATININE 1.03 09/02/2018 1623   CALCIUM 9.6 09/02/2018 1623   PROT 7.0 09/02/2018 1623   ALBUMIN 4.0 09/02/2018 1623   AST 14 09/02/2018 1623   ALT 17 09/02/2018 1623   ALKPHOS 133 (H) 09/02/2018 1623   BILITOT 0.3 09/02/2018 1623   GFRNONAA >60 11/21/2017 1541   GFRAA >60 11/21/2017 1541    Assessment: 1.  GERD: Uncontrolled for the past year on a twice daily PPI and twice daily H2 blocker, though the patient was not taking these correctly and is also hindering his progress with his diet; consider PUD versus gastritis +/- H. pylori 2.  Epigastric pain: With above  Plan: 1.  Scheduled patient for an EGD in the [facility] with Dr. Fuller Plan.  Did discuss risks, benefits, limitations and alternatives and patient agrees to proceed. 2.  Discuss timing of taking his medications.  He should be taking Ranitidine 150 mg every morning and nightly.  He should take his Pantoprazole 40 mg about 20-30 minutes before breakfast and his Nexium 20-30 minutes before dinner. 3.  Also reviewed antireflux diet and lifestyle modifications.  Provided him with a handout.  Explained in detail that chugging 3 sodas for breakthrough reflux would only make his symptoms worse.  Also  fried foods and hot wings are likely contributory. 4.  Prescribed GI cocktail 5-10 mL's as needed for breakthrough symptoms during the day. 5.  Patient to follow in clinic per recommendations from Dr. Fuller Plan after time of procedure.  Ellouise Newer, PA-C [facility] 10/23/2018, 8:21 AM  Cc: Biagio Borg, MD

## 2018-10-23 NOTE — Patient Instructions (Signed)
We have given you a Reflux handout. Take Raniticine ( Zantac) right away when y ou wake up and again before you go to bed. Take Pantoprazole sodium 40 mg 20 min before breakfast .  Take Nexium 40 mg 20 min before dinner. Stop sodas and hot wings. We are sending a prescription for GI Cocktail to [facility]- take 8-40 ml's for breakthrough reflux.   You have been scheduled for an endoscopy. Please follow written instructions given to you at your visit today. If you use inhalers (even only as needed), please bring them with you on the day of your procedure. Your physician has requested that you go to www.startemmi.com and enter the access code given to you at your visit today. This web site gives a general overview about your procedure. However, you should still follow specific instructions given to you by our office regarding your preparation for the procedure.

## 2018-10-26 ENCOUNTER — Observation Stay (HOSPITAL_BASED_OUTPATIENT_CLINIC_OR_DEPARTMENT_OTHER): Payer: Medicare Other

## 2018-10-26 ENCOUNTER — Telehealth: Payer: Self-pay | Admitting: Gastroenterology

## 2018-10-26 ENCOUNTER — Encounter (HOSPITAL_COMMUNITY): Payer: Self-pay | Admitting: Emergency Medicine

## 2018-10-26 ENCOUNTER — Emergency Department (HOSPITAL_COMMUNITY): Payer: Medicare Other

## 2018-10-26 ENCOUNTER — Other Ambulatory Visit: Payer: Self-pay

## 2018-10-26 ENCOUNTER — Observation Stay (HOSPITAL_COMMUNITY)
Admission: EM | Admit: 2018-10-26 | Discharge: 2018-10-27 | Disposition: A | Discharge status: Home / Self Care | Payer: Medicare Other | Attending: Internal Medicine | Admitting: Internal Medicine

## 2018-10-26 ENCOUNTER — Observation Stay (HOSPITAL_COMMUNITY): Payer: Medicare Other

## 2018-10-26 DIAGNOSIS — M4802 Spinal stenosis, cervical region: Secondary | ICD-10-CM | POA: Insufficient documentation

## 2018-10-26 DIAGNOSIS — R262 Difficulty in walking, not elsewhere classified: Secondary | ICD-10-CM | POA: Diagnosis present

## 2018-10-26 DIAGNOSIS — G4700 Insomnia, unspecified: Secondary | ICD-10-CM | POA: Diagnosis not present

## 2018-10-26 DIAGNOSIS — M1712 Unilateral primary osteoarthritis, left knee: Secondary | ICD-10-CM | POA: Insufficient documentation

## 2018-10-26 DIAGNOSIS — I639 Cerebral infarction, unspecified: Secondary | ICD-10-CM

## 2018-10-26 DIAGNOSIS — F17210 Nicotine dependence, cigarettes, uncomplicated: Secondary | ICD-10-CM | POA: Diagnosis not present

## 2018-10-26 DIAGNOSIS — K219 Gastro-esophageal reflux disease without esophagitis: Secondary | ICD-10-CM | POA: Insufficient documentation

## 2018-10-26 DIAGNOSIS — Z79899 Other long term (current) drug therapy: Secondary | ICD-10-CM | POA: Diagnosis not present

## 2018-10-26 DIAGNOSIS — M1610 Unilateral primary osteoarthritis, unspecified hip: Secondary | ICD-10-CM | POA: Diagnosis not present

## 2018-10-26 DIAGNOSIS — E785 Hyperlipidemia, unspecified: Secondary | ICD-10-CM | POA: Insufficient documentation

## 2018-10-26 DIAGNOSIS — I959 Hypotension, unspecified: Secondary | ICD-10-CM | POA: Diagnosis not present

## 2018-10-26 DIAGNOSIS — F17200 Nicotine dependence, unspecified, uncomplicated: Secondary | ICD-10-CM | POA: Diagnosis present

## 2018-10-26 DIAGNOSIS — G459 Transient cerebral ischemic attack, unspecified: Secondary | ICD-10-CM

## 2018-10-26 DIAGNOSIS — H548 Legal blindness, as defined in USA: Secondary | ICD-10-CM | POA: Diagnosis not present

## 2018-10-26 DIAGNOSIS — I10 Essential (primary) hypertension: Secondary | ICD-10-CM | POA: Insufficient documentation

## 2018-10-26 DIAGNOSIS — D571 Sickle-cell disease without crisis: Secondary | ICD-10-CM | POA: Insufficient documentation

## 2018-10-26 DIAGNOSIS — R531 Weakness: Secondary | ICD-10-CM | POA: Diagnosis present

## 2018-10-26 DIAGNOSIS — Z8673 Personal history of transient ischemic attack (TIA), and cerebral infarction without residual deficits: Secondary | ICD-10-CM

## 2018-10-26 HISTORY — DX: Personal history of transient ischemic attack (TIA), and cerebral infarction without residual deficits: Z86.73

## 2018-10-26 LAB — CBC
HCT: 44.7 % (ref 39.0–52.0)
Hemoglobin: 13.8 g/dL (ref 13.0–17.0)
MCH: 29.1 pg (ref 26.0–34.0)
MCHC: 30.9 g/dL (ref 30.0–36.0)
MCV: 94.3 fL (ref 80.0–100.0)
Platelets: 327 10*3/uL (ref 150–400)
RBC: 4.74 MIL/uL (ref 4.22–5.81)
RDW: 13.8 % (ref 11.5–15.5)
WBC: 10.6 10*3/uL — AB (ref 4.0–10.5)
nRBC: 0 % (ref 0.0–0.2)

## 2018-10-26 LAB — I-STAT CHEM 8, ED
BUN: 14 mg/dL (ref 6–20)
Calcium, Ion: 1.1 mmol/L — ABNORMAL LOW (ref 1.15–1.40)
Chloride: 103 mmol/L (ref 98–111)
Creatinine, Ser: 1.1 mg/dL (ref 0.61–1.24)
Glucose, Bld: 101 mg/dL — ABNORMAL HIGH (ref 70–99)
HCT: 45 % (ref 39.0–52.0)
HEMOGLOBIN: 15.3 g/dL (ref 13.0–17.0)
Potassium: 3.4 mmol/L — ABNORMAL LOW (ref 3.5–5.1)
Sodium: 138 mmol/L (ref 135–145)
TCO2: 26 mmol/L (ref 22–32)

## 2018-10-26 LAB — COMPREHENSIVE METABOLIC PANEL
ALT: 27 U/L (ref 0–44)
ANION GAP: 9 (ref 5–15)
AST: 24 U/L (ref 15–41)
Albumin: 3.7 g/dL (ref 3.5–5.0)
Alkaline Phosphatase: 99 U/L (ref 38–126)
BUN: 12 mg/dL (ref 6–20)
CO2: 24 mmol/L (ref 22–32)
Calcium: 9.2 mg/dL (ref 8.9–10.3)
Chloride: 104 mmol/L (ref 98–111)
Creatinine, Ser: 1.13 mg/dL (ref 0.61–1.24)
GFR calc non Af Amer: >60 mL/min (ref >60)
Glucose, Bld: 104 mg/dL — ABNORMAL HIGH (ref 70–99)
Potassium: 3.5 mmol/L (ref 3.5–5.1)
Sodium: 137 mmol/L (ref 135–145)
Total Bilirubin: 0.9 mg/dL (ref 0.3–1.2)
Total Protein: 6.8 g/dL (ref 6.5–8.1)

## 2018-10-26 LAB — DIFFERENTIAL
Abs Immature Granulocytes: 0.07 10*3/uL (ref 0.00–0.07)
Basophils Absolute: 0.1 10*3/uL (ref 0.0–0.1)
Basophils Relative: 1 %
EOS ABS: 0.2 10*3/uL (ref 0.0–0.5)
Eosinophils Relative: 1 %
Immature Granulocytes: 1 %
Lymphocytes Relative: 18 %
Lymphs Abs: 1.9 10*3/uL (ref 0.7–4.0)
MONO ABS: 1.2 10*3/uL — AB (ref 0.1–1.0)
Monocytes Relative: 11 %
Neutro Abs: 7.2 10*3/uL (ref 1.7–7.7)
Neutrophils Relative %: 68 %

## 2018-10-26 LAB — RAPID URINE DRUG SCREEN, HOSP PERFORMED
Amphetamines: NOT DETECTED
BARBITURATES: NOT DETECTED
Benzodiazepines: NOT DETECTED
Cocaine: NOT DETECTED
Opiates: POSITIVE — AB
TETRAHYDROCANNABINOL: NOT DETECTED

## 2018-10-26 LAB — I-STAT TROPONIN, ED: Troponin i, poc: 0 ng/mL (ref 0.00–0.08)

## 2018-10-26 LAB — PROTIME-INR
INR: 0.94
Prothrombin Time: 12.5 seconds (ref 11.4–15.2)

## 2018-10-26 LAB — APTT: APTT: 26 s (ref 24–36)

## 2018-10-26 LAB — ECHOCARDIOGRAM COMPLETE
Height: 73 in
Weight: 2617.3 oz

## 2018-10-26 LAB — CBG MONITORING, ED: GLUCOSE-CAPILLARY: 96 mg/dL (ref 70–99)

## 2018-10-26 MED ORDER — GABAPENTIN 100 MG PO CAPS
200.0000 mg | ORAL_CAPSULE | Freq: Two times a day (BID) | ORAL | Status: DC
  Administered 2018-10-26 – 2018-10-27 (×3): 200 mg via ORAL
  Filled 2018-10-26 (×3): qty 2

## 2018-10-26 MED ORDER — OXYBUTYNIN CHLORIDE 5 MG PO TABS
5.0000 mg | ORAL_TABLET | Freq: Every day | ORAL | Status: DC
  Administered 2018-10-26: 5 mg via ORAL
  Filled 2018-10-26: qty 1

## 2018-10-26 MED ORDER — STROKE: EARLY STAGES OF RECOVERY BOOK
Freq: Once | Status: AC
  Administered 2018-10-26: 14:00:00
  Filled 2018-10-26: qty 1

## 2018-10-26 MED ORDER — CLOPIDOGREL BISULFATE 300 MG PO TABS
300.0000 mg | ORAL_TABLET | Freq: Once | ORAL | Status: AC
  Administered 2018-10-26: 300 mg via ORAL
  Filled 2018-10-26: qty 1

## 2018-10-26 MED ORDER — FAMOTIDINE 20 MG PO TABS
20.0000 mg | ORAL_TABLET | Freq: Every day | ORAL | Status: DC
  Administered 2018-10-26 – 2018-10-27 (×2): 20 mg via ORAL
  Filled 2018-10-26 (×2): qty 1

## 2018-10-26 MED ORDER — POLYVINYL ALCOHOL 1.4 % OP SOLN
1.0000 [drp] | Freq: Three times a day (TID) | OPHTHALMIC | Status: DC | PRN
  Filled 2018-10-26: qty 15

## 2018-10-26 MED ORDER — HYDROCHLOROTHIAZIDE 12.5 MG PO CAPS
12.5000 mg | ORAL_CAPSULE | Freq: Every day | ORAL | Status: DC
  Administered 2018-10-26 – 2018-10-27 (×2): 12.5 mg via ORAL
  Filled 2018-10-26 (×2): qty 1

## 2018-10-26 MED ORDER — ONDANSETRON HCL 4 MG PO TABS: 4.0000 mg | ORAL_TABLET | Freq: Three times a day (TID) | ORAL | Status: DC | PRN

## 2018-10-26 MED ORDER — ASPIRIN EC 325 MG PO TBEC: 325.0000 mg | DELAYED_RELEASE_TABLET | ORAL | Status: AC

## 2018-10-26 MED ORDER — ASPIRIN 300 MG RE SUPP
300.0000 mg | RECTAL | Status: AC
  Administered 2018-10-26: 300 mg via RECTAL
  Filled 2018-10-26: qty 1

## 2018-10-26 MED ORDER — HYDROCODONE-ACETAMINOPHEN 5-325 MG PO TABS
1.0000 | ORAL_TABLET | ORAL | Status: DC | PRN
  Administered 2018-10-26 – 2018-10-27 (×2): 1 via ORAL
  Filled 2018-10-26 (×2): qty 1

## 2018-10-26 MED ORDER — AMBULATORY NON FORMULARY MEDICATION: 1 refills | Status: DC

## 2018-10-26 MED ORDER — TAMSULOSIN HCL 0.4 MG PO CAPS
0.4000 mg | ORAL_CAPSULE | Freq: Every day | ORAL | Status: DC
  Administered 2018-10-26 – 2018-10-27 (×2): 0.4 mg via ORAL
  Filled 2018-10-26 (×2): qty 1

## 2018-10-26 MED ORDER — IOPAMIDOL (ISOVUE-370) INJECTION 76%
90.0000 mL | Freq: Once | INTRAVENOUS | Status: AC | PRN
  Administered 2018-10-26: 115 mL via INTRAVENOUS

## 2018-10-26 MED ORDER — ATORVASTATIN CALCIUM 40 MG PO TABS
40.0000 mg | ORAL_TABLET | Freq: Every day | ORAL | Status: DC
  Administered 2018-10-26: 40 mg via ORAL
  Filled 2018-10-26: qty 1

## 2018-10-26 MED ORDER — ENOXAPARIN SODIUM 40 MG/0.4ML ~~LOC~~ SOLN
40.0000 mg | SUBCUTANEOUS | Status: DC
  Administered 2018-10-26 – 2018-10-27 (×2): 40 mg via SUBCUTANEOUS
  Filled 2018-10-26 (×2): qty 0.4

## 2018-10-26 MED ORDER — CLOPIDOGREL BISULFATE 75 MG PO TABS
75.0000 mg | ORAL_TABLET | Freq: Every day | ORAL | Status: DC
  Administered 2018-10-27: 75 mg via ORAL
  Filled 2018-10-26: qty 1

## 2018-10-26 MED ORDER — ASPIRIN 81 MG PO CHEW
81.0000 mg | CHEWABLE_TABLET | Freq: Every day | ORAL | Status: DC
  Administered 2018-10-27: 81 mg via ORAL
  Filled 2018-10-26: qty 1

## 2018-10-26 MED ORDER — KETOROLAC TROMETHAMINE 15.75 MG/SPRAY NA SOLN: 1.0000 | Freq: Four times a day (QID) | NASAL | Status: DC | PRN

## 2018-10-26 MED ORDER — ACETAMINOPHEN 500 MG PO TABS: 1000.0000 mg | ORAL_TABLET | Freq: Four times a day (QID) | ORAL | Status: DC | PRN

## 2018-10-26 MED ORDER — NORTRIPTYLINE HCL 25 MG PO CAPS
25.0000 mg | ORAL_CAPSULE | Freq: Every day | ORAL | Status: DC
  Administered 2018-10-26: 25 mg via ORAL
  Filled 2018-10-26: qty 1

## 2018-10-26 MED ORDER — DIPHENHYDRAMINE HCL 25 MG PO CAPS
25.0000 mg | ORAL_CAPSULE | Freq: Four times a day (QID) | ORAL | Status: DC | PRN
  Administered 2018-10-26 – 2018-10-27 (×2): 25 mg via ORAL
  Filled 2018-10-26 (×2): qty 1

## 2018-10-26 MED ORDER — CARBOXYMETHYLCELLULOSE SODIUM 0.5 % OP SOLN: 1.0000 [drp] | Freq: Three times a day (TID) | OPHTHALMIC | Status: DC | PRN

## 2018-10-26 NOTE — Evaluation (Signed)
Occupational Therapy Evaluation and Discharge Summary Patient Details Name: Mark Dunlap MRN: [id number] DOB: 09-07-1961 Today's Date: 10/26/2018    History of Present Illness pt is a 58 yo male admitted after getting up in middle of night and feeling unsteady. Pt fell x3 at home and had L side weakness and L facial droop.  CT- and Pt down for MRI.  Symptoms have resolved. Pt with pain in L hip from fall. Has THR in L but xray is -   Clinical Impression   Pt admitted with the above diagnosis and overall appears to have returned to baseline.  Pt does have some L hip pain from the fall he took at home. Xray is negative.  Pt lives with his mother who is available to assist if needed. Pt was able to complete adls in his room without assist despite the pain. No further acute OT needs at this time.      Follow Up Recommendations  No OT follow up    Equipment Recommendations  None recommended by OT    Recommendations for Other Services       Precautions / Restrictions Precautions Precautions: Fall Restrictions Weight Bearing Restrictions: No      Mobility Bed Mobility Overal bed mobility: Modified Independent             General bed mobility comments: extra time given  Transfers Overall transfer level: Needs assistance Equipment used: None Transfers: Sit to/from [employer] Transfers Sit to Stand: Supervision Stand pivot transfers: Supervision       General transfer comment: First time seeing pt and complaints of L hip pain therefore S given.    Balance Overall balance assessment: No apparent balance deficits (not formally assessed)                                         ADL either performed or assessed with clinical judgement   ADL Overall ADL's : At baseline                                       General ADL Comments: Pt struggled to get socks on due to L hip pain from his fall but was able to get them on. All xrays  for L hip are negative.  Pt able to complete all basic adls in room without assist. Pt taken to MRI mid eval.     Vision Baseline Vision/History: No visual deficits Patient Visual Report: No change from baseline Vision Assessment?: No apparent visual deficits     Perception Perception Perception Tested?: No   Praxis Praxis Praxis tested?: Within functional limits    Pertinent Vitals/Pain Pain Assessment: 0-10 Pain Score: 6  Pain Location: L hip from fall Pain Descriptors / Indicators: Aching Pain Intervention(s): Limited activity within patient's tolerance;Monitored during session;Repositioned     Hand Dominance Right   Extremity/Trunk Assessment Upper Extremity Assessment Upper Extremity Assessment: Overall WFL for tasks assessed   Lower Extremity Assessment Lower Extremity Assessment: Defer to PT evaluation   Cervical / Trunk Assessment Cervical / Trunk Assessment: Normal   Communication Communication Communication: No difficulties   Cognition Arousal/Alertness: Awake/alert Behavior During Therapy: WFL for tasks assessed/performed Overall Cognitive Status: Within Functional Limits for tasks assessed  General Comments: No cognitive deficits noted.   General Comments  Pt appears to be at his baseline per pt and brother other thank pain in L hip from his fall.     Exercises     Shoulder Instructions      Home Living Family/patient expects to be discharged to:: Private residence Living Arrangements: Parent Available Help at Discharge: Family;Available 24 hours/day Type of Home: House Home Access: Level entry     Home Layout: One level     Bathroom Shower/Tub: Occupational psychologist: Standard     Home Equipment: Shower seat - built in          Prior Functioning/Environment Level of Independence: Independent        Comments: Works full time for [employer]        OT Problem  List:        OT Treatment/Interventions:      OT Goals(Current goals can be found in the care plan section) Acute Rehab OT Goals Patient Stated Goal: to go home OT Goal Formulation: All assessment and education complete, DC therapy  OT Frequency:     Barriers to D/C:            Co-evaluation              AM-PAC OT "6 Clicks" Daily Activity     Outcome Measure Help from another person eating meals?: None Help from another person taking care of personal grooming?: None Help from another person toileting, which includes using toliet, bedpan, or urinal?: None Help from another person bathing (including washing, rinsing, drying)?: None Help from another person to put on and taking off regular upper body clothing?: None Help from another person to put on and taking off regular lower body clothing?: None 6 Click Score: 24   End of Session Nurse Communication: Mobility status  Activity Tolerance: Patient tolerated treatment well Patient left: Other (comment)(on way to MRI)  OT Visit Diagnosis: Unsteadiness on feet (R26.81)                Time: 5366-4403 OT Time Calculation (min): 15 min Charges:  OT General Charges $OT Visit: 1 Visit OT Evaluation $OT Eval Low Complexity: [place], OTR/L [phone]  Glenford Peers 10/26/2018, 1:00 PM

## 2018-10-26 NOTE — Consult Note (Signed)
Neurology Consultation Reason for Consult: Left-sided weakness Referring Physician: Laurance Flatten, care  CC: L Sided weakness  History is obtained from: Patient  HPI: Mark Dunlap is a 58 y.o. male with a history of hypertension, left eye blindness who presents with left-sided weakness that started sometime after going to bed at 10 PM.  He states that he got up around midnight and tried to get to the bathroom but was stumbling.  He then managed to get back to his bed and lay down and then around 2 he got up and was once again stumbling, finally, 911 was called and he was brought in as a code stroke.    LKW: 10 PM tpa given?: no, outside of window    ROS: A 14 point ROS was performed and is negative except as noted in the HPI.   Past Medical History:  Diagnosis Date  . Acute prostatitis 05/23/2009  . Adjustment disorder with depressed mood 09/2007  . CERVICAL RADICULOPATHY, LEFT 08/30/2008  . DJD (degenerative joint disease) of hip   . DJD (degenerative joint disease) of knee    left knee  . GERD (gastroesophageal reflux disease)   . 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 H/O: substance abuse ([place])    hx of ETOH/Crack cocaine-none since 11/08 per pt/Does not Drive due to this  . HYPERTENSION 08/30/2008  . HYPERTHYROIDISM 02/13/2010   pt was told by  Dr Jenny Reichmann  that thyroid was now back to normal .. 2012 ...   . HYPOTENSION 10/31/2009  . 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 Other and unspecified hyperlipidemia 08/20/2013  . PONV (postoperative nausea and vomiting)   . Seasonal allergies   . SPINAL STENOSIS, CERVICAL 08/08/2009  . THYROID NODULE, RIGHT 03/02/2010     Family History  Problem Relation Age of Onset  . Alcohol abuse Father   . Stroke Father   . Heart disease Father   . Arthritis Mother   . Hyperlipidemia Mother   . Goiter Mother        resection of benign  goiter  . Hypertension Sister   . Diabetes Sister   . Goiter Sister        resection of benign goiter  . Diabetes Brother   . Alcohol abuse Brother   . Alcohol abuse Cousin   . Heart disease Other        Aunt  . Colon cancer Neg Hx      Social History:  reports that he has been smoking cigarettes. He has a 20.00 pack-year smoking history. He has never used smokeless tobacco. He reports current drug use. Drug: "Crack" cocaine. He reports that he does not drink alcohol.   Exam: Current vital signs: BP 152/102, pulse 97, respiratory rate of 21, SPO2 of 100  Physical Exam  Constitutional: Appears well-developed and well-nourished.  Psych: Affect appropriate to situation Eyes: No scleral injection HENT: No OP obstrucion Head: Normocephalic.  Cardiovascular: Normal rate and regular rhythm.  Respiratory: Effort normal, non-labored breathing GI: Soft.  No distension. There is no tenderness.  Skin: WDI  Neuro: Mental Status: Patient is awake, alert, oriented to person, place, month, year, and situation. Patient is able to give a clear and coherent history. No signs of aphasia or neglect Cranial Nerves: II: Visual Fields are full. Pupils are equal, round, and reactive to light.  III,IV, VI: EOMI without ptosis or diploplia.  V: Facial sensation is symmetric to temperature VII: Facial movement is markedly weak on the left VIII: hearing is intact to voice X: Uvula elevates symmetrically XI: Shoulder shrug is symmetric. XII: tongue is midline without atrophy or fasciculations.  Motor: Tone is normal. Bulk is normal. 5/5 strength was present on the right side, on the left side he initially the bridge had very little movement of his left arm and 2/5 strength of the left leg, but after CT is able to lift both against gravity. Sensory: Sensation is symmetric to light touch and temperature in the arms and legs. Cerebellar: No clear ataxia in the right   I have reviewed labs in  epic and the results pertinent to this consultation are: CBC-mild leukocytosis Chem-8-mild hypokalemia at 3.4  I have reviewed the images obtained: CT/CTA/CTP- no acute findings  Impression: 58 year old male with signs and symptoms of a small subcortical infarct or (hopefully if he continues improving TIA).  He is improving, though he was essentially plegic on arrival.  He will need to be admitted for physical therapy and secondary work-up.  Recommendations: - HgbA1c, fasting lipid panel - MRI  of the brain without contrast - Frequent neuro checks - Echocardiogram - Prophylactic therapy-Antiplatelet med: Aspirin - dose 325mg  PO or 300mg  PR and Plavix 75 mg daily following 300 mg load - Risk factor modification - Telemetry monitoring - PT consult, OT consult, Speech consult - Stroke team to follow   Roland Rack, MD [facility] [phone]  If 7pm- 7am, please page neurology on call as listed in [place].

## 2018-10-26 NOTE — ED Provider Notes (Addendum)
TIME SEEN: 4:36 AM  CHIEF COMPLAINT: Code stroke  HPI: Patient is a 58 year old male with history of hypertension, substance abuse who presents to the emergency department with EMS as a code stroke.  Last seen normal at 10 PM.  Reportedly got up to go to the bathroom twice last night and fell because of left-sided weakness.  Denies any numbness.  Is legally blind in the left eye.  Blood glucose with EMS normal.  ROS: See HPI Constitutional: no fever  Eyes: no drainage  ENT: no runny nose   Cardiovascular:  no chest pain  Resp: no SOB  GI: no vomiting GU: no dysuria Integumentary: no rash  Allergy: no hives  Musculoskeletal: no leg swelling  Neurological: no slurred speech ROS otherwise negative  PAST MEDICAL HISTORY/PAST SURGICAL HISTORY:  Past Medical History:  Diagnosis Date  . Acute prostatitis 05/23/2009  . Adjustment disorder with depressed mood 09/2007  . CERVICAL RADICULOPATHY, LEFT 08/30/2008  . DJD (degenerative joint disease) of hip   . DJD (degenerative joint disease) of knee    left knee  . GERD (gastroesophageal reflux disease)   . 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 H/O: substance abuse ([place])    hx of ETOH/Crack cocaine-none since 11/08 per pt/Does not Drive due to this  . HYPERTENSION 08/30/2008  . HYPERTHYROIDISM 02/13/2010   pt was told by  Dr Jenny Reichmann  that thyroid was now back to normal .. 2012 ...   . HYPOTENSION 10/31/2009  . 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 Other and unspecified hyperlipidemia 08/20/2013  . PONV (postoperative nausea and vomiting)   . Seasonal allergies   . SPINAL STENOSIS, CERVICAL 08/08/2009  . THYROID NODULE, RIGHT 03/02/2010    MEDICATIONS:  Prior to Admission medications   Medication Sig Start Date End Date Taking? Authorizing Provider  acetaminophen (TYLENOL) 500 MG tablet Take 1,000 mg by mouth every 6 (six) hours as  needed for moderate pain or headache.    [provider]  adapalene (DIFFERIN) 0.1 % gel APPLY 1 APPLICATION TOPICALLY AT BEDTIME. 06/22/18   Biagio Borg, MD  AMBULATORY NON FORMULARY MEDICATION Medication Name: GI Cocktail 90 ml-Viscous lidocaine 90 ml-Dicyclomine 10 mg/60ml 270 ml  Maalox Total ml's 450 ml Take 5-10 ml's and swallow as needed for breakthroughl. 10/23/18   Levin Erp, PA  benzoyl peroxide (BENZOYL PEROXIDE) 5 % external liquid Apply 1 application topically 2 (two) times daily. 02/17/18   Biagio Borg, MD  bimatoprost (LUMIGAN) 0.01 % SOLN Place 1 drop into both eyes at bedtime.    [provider]  carboxymethylcellulose (REFRESH PLUS) 0.5 % SOLN Place 1 drop into both eyes 3 (three) times daily as needed (for clear eyes).     [provider]  diphenhydrAMINE (BENADRYL) 25 mg capsule Take 50 mg by mouth every 6 (six) hours as needed for itching.     [provider]  doxycycline (VIBRA-TABS) 100 MG tablet TAKE 1 TABLET BY MOUTH TWICE A DAY 10/21/18   Biagio Borg, MD  fluticasone [facility]) 50 MCG/ACT nasal spray USE 2 SPRAYS DAILY IN BOTH NOSTRILS 08/04/18   Biagio Borg, MD  gabapentin (NEURONTIN) 100 MG capsule Take 200 mg by mouth 2 (two) times daily. 04/13/18   [provider]  hydrochlorothiazide (MICROZIDE) 12.5 MG capsule TAKE 1 CAPSULE (12.5 MG TOTAL) DAILY  BY MOUTH. 08/13/18   Biagio Borg, MD  HYDROcodone-acetaminophen [facility]) 10-325 MG per tablet Take 1 tablet by mouth every 6 (six) hours as needed for moderate pain. 07/30/14   Consuella Lose, MD  imiquimod (ALDARA) 5 % cream Apply topically 3 (three) times a week. 04/24/18   Biagio Borg, MD  Ketorolac Tromethamine (SPRIX) 15.75 MG/SPRAY SOLN Place 1 spray into the nose every 6 (six) hours as needed (for pain).    [provider]  nortriptyline (PAMELOR) 25 MG capsule Take 25 mg by mouth at bedtime. 10/29/17   [provider]  ondansetron  (ZOFRAN) 4 MG tablet TAKE 1 TABLET (4 MG TOTAL) BY MOUTH EVERY 8 (EIGHT) HOURS AS NEEDED FOR NAUSEA OR VOMITING. 04/12/16   Biagio Borg, MD  oxybutynin (DITROPAN) 5 MG tablet Take 5 mg by mouth at bedtime. 10/28/17   [provider]  pantoprazole (PROTONIX) 40 MG tablet TAKE 1 TABLET (40 MG TOTAL) BY MOUTH DAILY. 04/23/18   Biagio Borg, MD  ranitidine (ZANTAC) 150 MG tablet TAKE 1 TABLET (150 MG TOTAL) 2 (TWO) TIMES DAILY BY MOUTH. 09/03/18   Biagio Borg, MD  tamsulosin (FLOMAX) 0.4 MG CAPS capsule TAKE 1 CAPSULE (0.4 MG TOTAL) BY MOUTH twice daily 08/21/17   Biagio Borg, MD  timolol (TIMOPTIC) 0.5 % ophthalmic solution Place 1 drop into both eyes daily. 10/28/17   [provider]  tiZANidine (ZANAFLEX) 4 MG capsule TAKE 1 CAPSULE BY ORAL ROUTE 3 TIMES EVERY DAY 11/28/17   Eustace Moore, MD  tretinoin (RETIN-A) 0.1 % cream APPLY TO AFFECTED AREA AT BEDTIME TO FACE 02/17/18   Biagio Borg, MD  zolpidem (AMBIEN) 10 MG tablet TAKE 1 TABLET AT BEDTIME AS NEEDED FOR SLEEP 09/03/18   Biagio Borg, MD    ALLERGIES:  Allergies  Allergen Reactions  . Hydrocodone Itching and Other (See Comments)    Pt can tolerate  . Tramadol Itching  . Morphine And Related Itching  . Oxycodone Itching    SOCIAL HISTORY:  Social History   Tobacco Use  . Smoking status: Current Every Day Smoker    Packs/day: 0.50    Years: 40.00    Pack years: 20.00    Types: Cigarettes  . Smokeless tobacco: Never Used  . Tobacco comment: last crack over 1 yr   alcohol last time few yrs. States today is his quit date for smoking   Substance Use Topics  . Alcohol use: No    Alcohol/week: 0.0 standard drinks    Comment: hx of alcohol abuse    FAMILY HISTORY: Family History  Problem Relation Age of Onset  . Alcohol abuse Father   . Stroke Father   . Heart disease Father   . Arthritis Mother   . Hyperlipidemia Mother   . Goiter Mother        resection of benign goiter  . Hypertension Sister   .  Diabetes Sister   . Goiter Sister        resection of benign goiter  . Diabetes Brother   . Alcohol abuse Brother   . Alcohol abuse Cousin   . Heart disease Other        Aunt  . Colon cancer Neg Hx     EXAM: There were no vitals taken for this visit. CONSTITUTIONAL: Alert and oriented and responds appropriately to questions.  Tonically ill-appearing HEAD: Normocephalic EYES: Conjunctivae clear, pupils appear equal, EOMI ENT: normal nose; moist mucous membranes  NECK: Supple, no meningismus, no nuchal rigidity, no LAD  CARD: RRR; S1 and S2 appreciated; no murmurs, no clicks, no rubs, no gallops RESP: Normal chest excursion without splinting or tachypnea; breath sounds clear and equal bilaterally; no wheezes, no rhonchi, no rales, no hypoxia or respiratory distress, speaking full sentences ABD/GI: Normal bowel sounds; non-distended; soft, non-tender, no rebound, no guarding, no peritoneal signs, no hepatosplenomegaly BACK:  The back appears normal and is non-tender to palpation, there is no CVA tenderness EXT: Normal ROM in all joints; non-tender to palpation; no edema; normal capillary refill; no cyanosis, no calf tenderness or swelling    SKIN: Normal color for age and race; warm; no rash NEURO: Patient has left upper and lower extremity weakness on examination compared to right.  No sensory deficits.  Dysarthric speech.  Patient does have left facial droop.  Blind in the left eye.  No aphasia.  No neglect. PSYCH: The patient's mood and manner are appropriate. Grooming and personal hygiene are appropriate.  MEDICAL DECISION MAKING: Patient here with L hemiplegia, dysarthria.  Last seen normal 10 PM.  Patient comes in as a code stroke.  CT head shows no acute abnormality.  Symptoms slowly improving.  Not a TPA candidate given patient is outside window of treatment.  Patient will need admission.  Seen by Dr. Leonel Ramsay with neurology upon patient arrival.  ED PROGRESS: On my reexamination,  most of patient's neurologic deficits have completely resolved.  Drift has resolved.  No facial asymmetry.  Still having mild dysarthric speech.  Complaining of left-sided facial pain after his fall, neck pain and left hip pain.  Will obtain imaging.  Will discuss with medicine for admission.   5:13 AM Discussed patient's case with hospitalist, Dr. Hal Hope.  I have recommended admission and patient (and family if present) agree with this plan. Admitting physician will place admission orders.   I reviewed all nursing notes, vitals, pertinent previous records, EKGs, lab and urine results, imaging (as available).   6:10 AM  CT of the face shows remote fractures of the left side of the face but nothing appears acute.  CT cervical spine shows no acute abnormality.  X-ray of the hip shows normal prosthesis without acute abnormalities.   EKG Interpretation  Date/Time:  Monday October 26 2018 05:03:54 EST Ventricular Rate:  92 PR Interval:    QRS Duration: 114 QT Interval:  354 QTC Calculation: 438 R Axis:   -47 Text Interpretation:  Sinus rhythm Borderline prolonged PR interval Probable left atrial enlargement Left anterior fascicular block Abnormal R-wave progression, early transition Left ventricular hypertrophy Baseline wander in lead(s) V6 No significant change since last tracing Confirmed by , Cyril Mourning [phone]) on 10/26/2018 5:09:50 AM         , Delice Bison, DO 10/26/18 [place], Delice Bison, DO 10/26/18 [phone]

## 2018-10-26 NOTE — Progress Notes (Signed)
  Echocardiogram 2D Echocardiogram has been performed.  Mark Dunlap 10/26/2018, 3:34 PM

## 2018-10-26 NOTE — Progress Notes (Signed)
PT Cancellation Note  Patient Details Name: GURNIE DURIS MRN: [id number] DOB: Oct 06, 1961   Cancelled Treatment:    Reason Eval/Treat Not Completed: Patient at procedure or test/unavailable Pt currently having echo. Will follow up as schedule allows.   Leighton Ruff, PT, DPT  Acute Rehabilitation Services  Pager: [phone] Office: [phone]    Rudean Hitt 10/26/2018, 2:56 PM

## 2018-10-26 NOTE — Telephone Encounter (Signed)
Pt is scheduled for an egd with Dr. Fuller Plan on 10/28/18, he is hospitalized at [facility] for a TIA. He does not sure is he will be discharge tomorrow and wants to know if he should r/s his procedure. Pls call him and if he does not answer leave a detailed message.

## 2018-10-26 NOTE — ED Triage Notes (Signed)
Pt in from home via [facility] as code stroke with L side wkns, L facial droop. Per EMS, pt seen normal before bed at 2200, got up at 0000, fell x 3 and L side flaccidness then noticed. Pt fell to L hip, is c/o pain and limited ROM to that side. A&ox4, speech clear. To CT on arrival

## 2018-10-26 NOTE — Telephone Encounter (Signed)
Patient admitted to [facility] this am with acute TIA.  He is started on lovenox as well as plavix.  He is advised that he needs to cancel the EGD on 10/28/18.  He is advised that he will need clearance and the ability to hol;d Plavix.  Most likely will not be for at least 6 months.  He will call back if he has additional questions or concerns.  He understands to call back when he is released by neurology.

## 2018-10-26 NOTE — ED Notes (Signed)
Ordered diet tray for pt  

## 2018-10-26 NOTE — ED Notes (Signed)
CBG 96. 

## 2018-10-26 NOTE — H&P (Signed)
History and Physical    Mark Dunlap TKW:[id number] DOB: 10/25/1960 DOA: 10/26/2018  PCP: Biagio Borg, MD  Patient coming from: Home.  I have personally briefly reviewed patient's old medical records in [place] and Care everywhere.   Chief Complaint: Left-sided weakness, unable to walk.  HPI: Mark Dunlap is a 58 y.o. male with medical history significant of smoking, hypertension, hyperlipidemia not on treatment, crack cocaine abuse and prostatism who presents to the emergency room with episode of sudden weakness at night.  According to the patient, they watched game last night until 10 PM, he took Ambien to go to bed, little bit later he did not get sleep so he took another Ambien to try to sleep.  At about 2 AM in the morning, he thought he has to get ready to go to work so he went to the bathroom, he lost balance and fell.  He managed to come back to the bedroom then again went back to shower where he could not maintain his balance.  Patient does not quite remember that he had weakness on one side.  He just could not keep up his balance.  His mother lives with him.  They called 911 and patient was brought to the ER.  EMS reported that patient was unable to move his left leg.  Patient himself is currently sleepy.  Probably sleep deprived and double dose of Ambien.  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  He denies any symptoms currently.  He is able to swallow.   Denies any fever chills.  Denies any flulike symptoms.  Denies any nausea, vomiting, headache, dizziness, lightheadedness.  He was very sleepy last night.  Denies any bowel or urine changes.  Denies any abdominal pain.  His appetite is good.  He does have problems with drug abuse, however does not admit in front of the family and sister denies any such problem. ED Course: On arrival to the emergency room code neuro was called.  ER physician has examined patient who noted left upper and lower extremity weakness on examination  compared to right.  He also noted to have left facial droop.  He is blind on the left eye.  No neglect no aphasia.  Family did report that he was weak on the left side. Code stroke was called.  CT head normal.  CTA of the head and neck without emergent blockage.  Skeletal survey negative.  On repeat examination, his neurological deficits completely resolved.  No intervention was done on arrival because last seen normal was 10 PM last night and is neurological deficits improved.  Review of Systems: As per HPI otherwise 10 point review of systems negative.    Past Medical History:  Diagnosis Date  . Acute prostatitis 05/23/2009  . Adjustment disorder with depressed mood 09/2007  . CERVICAL RADICULOPATHY, LEFT 08/30/2008  . DJD (degenerative joint disease) of hip   . DJD (degenerative joint disease) of knee    left knee  . GERD (gastroesophageal reflux disease)   . 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 H/O: substance abuse ([place])    hx of ETOH/Crack cocaine-none since 11/08 per pt/Does not Drive due to this  . HYPERTENSION 08/30/2008  . HYPERTHYROIDISM 02/13/2010   pt was told by  Dr Jenny Reichmann  that thyroid was now back to normal .. 2012 ...   . HYPOTENSION 10/31/2009  . Legally blind in left eye, as defined in  [place]    Blind Left eye, small amt vision Right eye  . Other and unspecified hyperlipidemia 08/20/2013  . PONV (postoperative nausea and vomiting)   . Seasonal allergies   . SPINAL STENOSIS, CERVICAL 08/08/2009  . THYROID NODULE, RIGHT 03/02/2010    Past Surgical History:  Procedure Laterality Date  . ANTERIOR CERVICAL DECOMP/DISCECTOMY FUSION  10/11/2011   Procedure: ANTERIOR CERVICAL DECOMPRESSION/DISCECTOMY FUSION 3 LEVELS;  Surgeon: Eustace Moore;  Location: [place] [facility];  Service: Neurosurgery;  Laterality: N/A;  Cervical three-four ,cervical four five cervical five six Anterior Cervical Decompression Fusion with peek + plate Nuvasive  translational plate Orthofix peek (2 1/2 hours) Rm # [id number]  . ANTERIOR CERVICAL DECOMP/DISCECTOMY FUSION N/A 07/29/2014   Procedure: ANTERIOR CERVICAL DECOMPRESSION/DISCECTOMY FUSION 1 LEVEL/HARDWARE REMOVAL;  Surgeon: Eustace Moore, MD;  Location: [facility];  Service: Neurosurgery;  Laterality: N/A;  cervical six-seven  . POSTERIOR CERVICAL FUSION/FORAMINOTOMY N/A 09/23/2013   Procedure: CERVICALTWO TO CERVICAL SEVEN POSTERIOR CERVICAL FUSION/FORAMINOTOMY WITH LATERAL MASS FIXATION;  Surgeon: Eustace Moore, MD;  Location: [facility] [facility];  Service: Neurosurgery;  Laterality: N/A;  . Right Hand I&D  12/07   s/p rdue to abscess-Dr. Amedeo Plenty  . TOTAL HIP ARTHROPLASTY  12/17/2011   Procedure: TOTAL HIP ARTHROPLASTY;  Surgeon: Johnny Bridge, MD;  Location: [facility];  Service: Orthopedics;  Laterality: Left;     reports that he has been smoking cigarettes. He has a 20.00 pack-year smoking history. He has never used smokeless tobacco. He reports current drug use. Drug: "Crack" cocaine. He reports that he does not drink alcohol.  Allergies  Allergen Reactions  . Hydrocodone Itching and Other (See Comments)    Pt can tolerate  . Tramadol Itching  . Morphine And Related Itching  . Oxycodone Itching    Family History  Problem Relation Age of Onset  . Alcohol abuse Father   . Stroke Father   . Heart disease Father   . Arthritis Mother   . Hyperlipidemia Mother   . Goiter Mother        resection of benign goiter  . Hypertension Sister   . Diabetes Sister   . Goiter Sister        resection of benign goiter  . Diabetes Brother   . Alcohol abuse Brother   . Alcohol abuse Cousin   . Heart disease Other        Aunt  . Colon cancer Neg Hx      Prior to Admission medications   Medication Sig Start Date End Date Taking? Authorizing Provider  acetaminophen (TYLENOL) 500 MG tablet Take 1,000 mg by mouth every 6 (six) hours as needed for moderate pain or headache.    [provider]   adapalene (DIFFERIN) 0.1 % gel APPLY 1 APPLICATION TOPICALLY AT BEDTIME. 06/22/18   Biagio Borg, MD  AMBULATORY NON FORMULARY MEDICATION Medication Name: GI Cocktail 90 ml-Viscous lidocaine 90 ml-Dicyclomine 10 mg/50ml 270 ml  Maalox Total ml's 450 ml Take 5-10 ml's and swallow as needed for breakthroughl. 10/23/18   Levin Erp, PA  benzoyl peroxide (BENZOYL PEROXIDE) 5 % external liquid Apply 1 application topically 2 (two) times daily. 02/17/18   Biagio Borg, MD  bimatoprost (LUMIGAN) 0.01 % SOLN Place 1 drop into both eyes at bedtime.    [provider]  carboxymethylcellulose (REFRESH PLUS) 0.5 % SOLN Place 1 drop into both eyes 3 (three) times daily as needed (for clear eyes).  [provider]  diphenhydrAMINE (BENADRYL) 25 mg capsule Take 50 mg by mouth every 6 (six) hours as needed for itching.     [provider]  doxycycline (VIBRA-TABS) 100 MG tablet TAKE 1 TABLET BY MOUTH TWICE A DAY 10/21/18   Biagio Borg, MD  fluticasone [facility]) 50 MCG/ACT nasal spray USE 2 SPRAYS DAILY IN BOTH NOSTRILS 08/04/18   Biagio Borg, MD  gabapentin (NEURONTIN) 100 MG capsule Take 200 mg by mouth 2 (two) times daily. 04/13/18   [provider]  hydrochlorothiazide (MICROZIDE) 12.5 MG capsule TAKE 1 CAPSULE (12.5 MG TOTAL) DAILY BY MOUTH. 08/13/18   Biagio Borg, MD  HYDROcodone-acetaminophen [facility]) 10-325 MG per tablet Take 1 tablet by mouth every 6 (six) hours as needed for moderate pain. 07/30/14   Consuella Lose, MD  imiquimod (ALDARA) 5 % cream Apply topically 3 (three) times a week. 04/24/18   Biagio Borg, MD  Ketorolac Tromethamine (SPRIX) 15.75 MG/SPRAY SOLN Place 1 spray into the nose every 6 (six) hours as needed (for pain).    [provider]  nortriptyline (PAMELOR) 25 MG capsule Take 25 mg by mouth at bedtime. 10/29/17   [provider]  ondansetron (ZOFRAN) 4 MG tablet TAKE 1 TABLET (4 MG TOTAL) BY MOUTH EVERY 8 (EIGHT)  HOURS AS NEEDED FOR NAUSEA OR VOMITING. 04/12/16   Biagio Borg, MD  oxybutynin (DITROPAN) 5 MG tablet Take 5 mg by mouth at bedtime. 10/28/17   [provider]  pantoprazole (PROTONIX) 40 MG tablet TAKE 1 TABLET (40 MG TOTAL) BY MOUTH DAILY. 04/23/18   Biagio Borg, MD  ranitidine (ZANTAC) 150 MG tablet TAKE 1 TABLET (150 MG TOTAL) 2 (TWO) TIMES DAILY BY MOUTH. 09/03/18   Biagio Borg, MD  tamsulosin (FLOMAX) 0.4 MG CAPS capsule TAKE 1 CAPSULE (0.4 MG TOTAL) BY MOUTH twice daily 08/21/17   Biagio Borg, MD  timolol (TIMOPTIC) 0.5 % ophthalmic solution Place 1 drop into both eyes daily. 10/28/17   [provider]  tiZANidine (ZANAFLEX) 4 MG capsule TAKE 1 CAPSULE BY ORAL ROUTE 3 TIMES EVERY DAY 11/28/17   Eustace Moore, MD  tretinoin (RETIN-A) 0.1 % cream APPLY TO AFFECTED AREA AT BEDTIME TO FACE 02/17/18   Biagio Borg, MD  zolpidem (AMBIEN) 10 MG tablet TAKE 1 TABLET AT BEDTIME AS NEEDED FOR SLEEP 09/03/18   Biagio Borg, MD    Physical Exam: Vitals:   10/26/18 0615 10/26/18 0645 10/26/18 0700 10/26/18 0715  BP: (!) 152/94 139/85 113/74 124/70  Pulse: 66 71 80 79  Resp: 11 12 (!) 25 14  Temp:      TempSrc:      SpO2: 100% 100% 99% 99%  Weight:        Constitutional: NAD, calm, comfortable Vitals:   10/26/18 0615 10/26/18 0645 10/26/18 0700 10/26/18 0715  BP: (!) 152/94 139/85 113/74 124/70  Pulse: 66 71 80 79  Resp: 11 12 (!) 25 14  Temp:      TempSrc:      SpO2: 100% 100% 99% 99%  Weight:       Eyes: PERRL, lids and conjunctivae normal ENMT: Mucous membranes are moist. Posterior pharynx clear of any exudate or lesions.Normal dentition.  Neck: normal, supple, no masses, no thyromegaly Respiratory: clear to auscultation bilaterally, no wheezing, no crackles. Normal respiratory effort. No accessory muscle use.  Cardiovascular: Regular rate and rhythm, no murmurs / rubs / gallops. No extremity edema. 2+ pedal  pulses. No carotid bruits.  Abdomen: no tenderness,  no masses palpated. No hepatosplenomegaly. Bowel sounds positive.  Musculoskeletal: no clubbing / cyanosis. No joint deformity upper and lower extremities. Good ROM, no contractures. Normal muscle tone.  Skin: no rashes, lesions, ulcers. No induration Neurologic: CN 2-12 grossly intact. Sensation intact, DTR normal. Strength 5/5 in all 4.  Psychiatric: Normal judgment and insight. Alert and oriented x 3. Normal mood.  Patient is sleepy now, wakes up and talks.  Do not find any neurological deficit.   Labs on Admission: I have personally reviewed following labs and imaging studies  CBC: Recent Labs  Lab 10/26/18 0435 10/26/18 0443  WBC 10.6*  --   NEUTROABS 7.2  --   HGB 13.8 15.3  HCT 44.7 45.0  MCV 94.3  --   PLT 327  --    Basic Metabolic Panel: Recent Labs  Lab 10/26/18 0435 10/26/18 0443  NA 137 138  K 3.5 3.4*  CL 104 103  CO2 24  --   GLUCOSE 104* 101*  BUN 12 14  CREATININE 1.13 1.10  CALCIUM 9.2  --    GFR: Estimated Creatinine Clearance: 79.4 mL/min (by C-G formula based on SCr of 1.1 mg/dL). Liver Function Tests: Recent Labs  Lab 10/26/18 0435  AST 24  ALT 27  ALKPHOS 99  BILITOT 0.9  PROT 6.8  ALBUMIN 3.7   No results for input(s): LIPASE, AMYLASE in the last 168 hours. No results for input(s): AMMONIA in the last 168 hours. Coagulation Profile: Recent Labs  Lab 10/26/18 0435  INR 0.94   Cardiac Enzymes: No results for input(s): CKTOTAL, CKMB, CKMBINDEX, TROPONINI in the last 168 hours. BNP (last 3 results) No results for input(s): PROBNP in the last 8760 hours. HbA1C: No results for input(s): HGBA1C in the last 72 hours. CBG: Recent Labs  Lab 10/26/18 0434  GLUCAP 96   Lipid Profile: No results for input(s): CHOL, HDL, LDLCALC, TRIG, CHOLHDL, LDLDIRECT in the last 72 hours. Thyroid Function Tests: No results for input(s): TSH, T4TOTAL, FREET4, T3FREE, THYROIDAB in the last 72 hours. Anemia Panel: No results for input(s):  VITAMINB12, FOLATE, FERRITIN, TIBC, IRON, RETICCTPCT in the last 72 hours. Urine analysis:    Component Value Date/Time   COLORURINE YELLOW 09/02/2018 1623   APPEARANCEUR CLEAR 09/02/2018 1623   LABSPEC <=1.005 (A) 09/02/2018 1623   PHURINE 7.0 09/02/2018 1623   GLUCOSEU NEGATIVE 09/02/2018 1623   HGBUR NEGATIVE 09/02/2018 1623   BILIRUBINUR NEGATIVE 09/02/2018 1623   BILIRUBINUR neg 08/28/2017 1032   KETONESUR NEGATIVE 09/02/2018 1623   PROTEINUR neg 08/28/2017 1032   PROTEINUR NEGATIVE 10/11/2011 0625   UROBILINOGEN 0.2 09/02/2018 1623   NITRITE NEGATIVE 09/02/2018 1623   LEUKOCYTESUR TRACE (A) 09/02/2018 1623    Radiological Exams on Admission: Ct Angio Head W Or Wo Contrast  Result Date: 10/26/2018 CLINICAL DATA:  Post stroke.  Left arm and leg weakness EXAM: CT ANGIOGRAPHY HEAD AND NECK CT PERFUSION BRAIN TECHNIQUE: Multidetector CT imaging of the head and neck was performed using the standard protocol during bolus administration of intravenous contrast. Multiplanar CT image reconstructions and MIPs were obtained to evaluate the vascular anatomy. Carotid stenosis measurements (when applicable) are obtained utilizing NASCET criteria, using the distal internal carotid diameter as the denominator. Multiphase CT imaging of the brain was performed following IV bolus contrast injection. Subsequent parametric perfusion maps were calculated using RAPID software. CONTRAST:  165mL ISOVUE-370 IOPAMIDOL (ISOVUE-370) INJECTION 76% COMPARISON:  Head CT from earlier today FINDINGS:  CTA NECK FINDINGS Aortic arch: Atherosclerotic plaque.  Three vessel branching. Right carotid system: Vessels are smooth and widely patent. Limited atheromatous changes at the bifurcation. Left carotid system: Vessels are smooth and widely patent. Moderate calcified plaque at the ICA bulb. Vertebral arteries: No proximal subclavian stenosis. Plaque at both vertebral ostia of the vertebral arteries are widely patent and smooth  all the way to the dura. Skeleton: C3-C7 ACDF. Solid arthrodesis is seen except at C6-7 with ACDF hardware is well located but there is clear lucency around the C7 lateral mass screws. No acute osseous finding. Other neck: No emergent finding Upper chest: Biapical scar-like appearance Review of the MIP images confirms the above findings CTA HEAD FINDINGS Anterior circulation: Atherosclerotic plaque on the carotid siphons. There is suboptimal bolus density but still diagnostic for excluding large vessel occlusion. No flow limiting stenosis or aneurysm seen. Posterior circulation: Symmetric vertebral arteries. The vertebral and basilar arteries are smooth and widely patent. Symmetric flow with no evident branch occlusion Venous sinuses: Patent Anatomic variants: None significant Delayed phase: Not obtained in the emergent setting. Review of the MIP images confirms the above findings CT Brain Perfusion Findings: CBF (<30%) Volume: 67mL Perfusion (Tmax>6.0s) volume: 80mL These results were communicated to Dr. Leonel Ramsay at 5:12 amon 1/13/2020by text page via the [facility] messaging system. IMPRESSION: 1. No emergent large vessel occlusion. No infarct or ischemia by CT perfusion. 2. Cervical and intracranial atherosclerosis without proximal flow limiting stenosis. 3. Suboptimal arterial bolus density. 4. C3-C7 fusion with solid arthrodesis except at C6-7 where there are findings of pseudoarthrosis. Electronically Signed   By: Monte Fantasia M.D.   On: 10/26/2018 05:12   Ct Angio Neck W Or Wo Contrast  Result Date: 10/26/2018 CLINICAL DATA:  Post stroke.  Left arm and leg weakness EXAM: CT ANGIOGRAPHY HEAD AND NECK CT PERFUSION BRAIN TECHNIQUE: Multidetector CT imaging of the head and neck was performed using the standard protocol during bolus administration of intravenous contrast. Multiplanar CT image reconstructions and MIPs were obtained to evaluate the vascular anatomy. Carotid stenosis measurements (when applicable)  are obtained utilizing NASCET criteria, using the distal internal carotid diameter as the denominator. Multiphase CT imaging of the brain was performed following IV bolus contrast injection. Subsequent parametric perfusion maps were calculated using RAPID software. CONTRAST:  127mL ISOVUE-370 IOPAMIDOL (ISOVUE-370) INJECTION 76% COMPARISON:  Head CT from earlier today FINDINGS: CTA NECK FINDINGS Aortic arch: Atherosclerotic plaque.  Three vessel branching. Right carotid system: Vessels are smooth and widely patent. Limited atheromatous changes at the bifurcation. Left carotid system: Vessels are smooth and widely patent. Moderate calcified plaque at the ICA bulb. Vertebral arteries: No proximal subclavian stenosis. Plaque at both vertebral ostia of the vertebral arteries are widely patent and smooth all the way to the dura. Skeleton: C3-C7 ACDF. Solid arthrodesis is seen except at C6-7 with ACDF hardware is well located but there is clear lucency around the C7 lateral mass screws. No acute osseous finding. Other neck: No emergent finding Upper chest: Biapical scar-like appearance Review of the MIP images confirms the above findings CTA HEAD FINDINGS Anterior circulation: Atherosclerotic plaque on the carotid siphons. There is suboptimal bolus density but still diagnostic for excluding large vessel occlusion. No flow limiting stenosis or aneurysm seen. Posterior circulation: Symmetric vertebral arteries. The vertebral and basilar arteries are smooth and widely patent. Symmetric flow with no evident branch occlusion Venous sinuses: Patent Anatomic variants: None significant Delayed phase: Not obtained in the emergent setting. Review of the MIP images  confirms the above findings CT Brain Perfusion Findings: CBF (<30%) Volume: 65mL Perfusion (Tmax>6.0s) volume: 10mL These results were communicated to Dr. Leonel Ramsay at 5:12 amon 1/13/2020by text page via the [facility] messaging system. IMPRESSION: 1. No emergent large vessel  occlusion. No infarct or ischemia by CT perfusion. 2. Cervical and intracranial atherosclerosis without proximal flow limiting stenosis. 3. Suboptimal arterial bolus density. 4. C3-C7 fusion with solid arthrodesis except at C6-7 where there are findings of pseudoarthrosis. Electronically Signed   By: Monte Fantasia M.D.   On: 10/26/2018 05:12   Ct Cervical Spine Wo Contrast  Result Date: 10/26/2018 CLINICAL DATA:  Multiple falls in the past week. EXAM: CT CERVICAL SPINE WITHOUT CONTRAST TECHNIQUE: Multidetector CT imaging of the cervical spine was performed without intravenous contrast. Multiplanar CT image reconstructions were also generated. COMPARISON:  Cervical myelogram 03/25/2014 FINDINGS: Alignment: Physiologic alignment Skull base and vertebrae: Negative for fracture. Soft tissues and spinal canal: No prevertebral fluid or swelling. No visible canal hematoma. Disc levels: C3-C7 posterior rod and screw fixation. Chronic lucency around the C7 lateral mass screws with some ingrowth of bone since 2015 suggesting that an interval C6-7 ACDF has achieved solid arthrodesis (although difficult to demonstrate on reformats). No ACDF complication. There is solid interbody arthrodesis from C3-C6. C2-3 central disc protrusion with buttressing osteophyte and ligamentum flavum thickening that causes flattening of the cord Upper chest: No acute finding IMPRESSION: 1. No emergent finding. 2. Postoperative changes as described. 3. 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 Electronically Signed   By: Monte Fantasia M.D.   On: 10/26/2018 05:59   Ct Cerebral Perfusion W Contrast  Result Date: 10/26/2018 CLINICAL DATA:  Post stroke.  Left arm and leg weakness EXAM: CT ANGIOGRAPHY HEAD AND NECK CT PERFUSION BRAIN TECHNIQUE: Multidetector CT imaging of the head and neck was performed using the standard protocol during bolus administration of intravenous contrast. Multiplanar CT image reconstructions and MIPs were obtained to  evaluate the vascular anatomy. Carotid stenosis measurements (when applicable) are obtained utilizing NASCET criteria, using the distal internal carotid diameter as the denominator. Multiphase CT imaging of the brain was performed following IV bolus contrast injection. Subsequent parametric perfusion maps were calculated using RAPID software. CONTRAST:  153mL ISOVUE-370 IOPAMIDOL (ISOVUE-370) INJECTION 76% COMPARISON:  Head CT from earlier today FINDINGS: CTA NECK FINDINGS Aortic arch: Atherosclerotic plaque.  Three vessel branching. Right carotid system: Vessels are smooth and widely patent. Limited atheromatous changes at the bifurcation. Left carotid system: Vessels are smooth and widely patent. Moderate calcified plaque at the ICA bulb. Vertebral arteries: No proximal subclavian stenosis. Plaque at both vertebral ostia of the vertebral arteries are widely patent and smooth all the way to the dura. Skeleton: C3-C7 ACDF. Solid arthrodesis is seen except at C6-7 with ACDF hardware is well located but there is clear lucency around the C7 lateral mass screws. No acute osseous finding. Other neck: No emergent finding Upper chest: Biapical scar-like appearance Review of the MIP images confirms the above findings CTA HEAD FINDINGS Anterior circulation: Atherosclerotic plaque on the carotid siphons. There is suboptimal bolus density but still diagnostic for excluding large vessel occlusion. No flow limiting stenosis or aneurysm seen. Posterior circulation: Symmetric vertebral arteries. The vertebral and basilar arteries are smooth and widely patent. Symmetric flow with no evident branch occlusion Venous sinuses: Patent Anatomic variants: None significant Delayed phase: Not obtained in the emergent setting. Review of the MIP images confirms the above findings CT Brain Perfusion Findings: CBF (<30%) Volume: 52mL Perfusion (Tmax>6.0s) volume: 73mL These  results were communicated to Dr. Leonel Ramsay at 5:12 amon 1/13/2020by  text page via the [facility] messaging system. IMPRESSION: 1. No emergent large vessel occlusion. No infarct or ischemia by CT perfusion. 2. Cervical and intracranial atherosclerosis without proximal flow limiting stenosis. 3. Suboptimal arterial bolus density. 4. C3-C7 fusion with solid arthrodesis except at C6-7 where there are findings of pseudoarthrosis. Electronically Signed   By: Monte Fantasia M.D.   On: 10/26/2018 05:12   Dg Hip Unilat W Or Wo Pelvis 2-3 Views Left  Result Date: 10/26/2018 CLINICAL DATA:  58 y/o  M; left hip pain, fall, and stroke. EXAM: DG HIP (WITH OR WITHOUT PELVIS) 2-3V LEFT COMPARISON:  None. FINDINGS: There is no evidence of hip fracture or dislocation. Left total hip arthroplasty. No periprosthetic lucency or fracture identified. Partially visualized lower lumbar fusion hardware. IMPRESSION: 1. No acute fracture or dislocation identified. 2. Left hip prosthesis.  No apparent hardware related complication. Electronically Signed   By: Kristine Garbe M.D.   On: 10/26/2018 06:08   Ct Head Code Stroke Wo Contrast  Result Date: 10/26/2018 CLINICAL DATA:  Code stroke. 58 y/o M; left-sided leg and arm weakness as well as left facial droop. EXAM: CT HEAD WITHOUT CONTRAST TECHNIQUE: Contiguous axial images were obtained from the base of the skull through the vertex without intravenous contrast. COMPARISON:  None. FINDINGS: Brain: No evidence of large vascular territory acute infarction, hemorrhage, hydrocephalus, extra-axial collection or mass lesion/mass effect. There are several lucencies in the basal ganglia including bilateral thalami, right caudate head, and right anterior lentiform nucleus, probably representing chronic lacunar infarcts. Additionally, there are numerous hypodensities in white matter compatible with chronic microvascular ischemic changes, age advanced. Vascular: Calcific atherosclerosis of carotid siphons. No hyperdense vessel identified. Skull: Normal.  Negative for fracture or focal lesion. Sinuses/Orbits: Mild right maxillary sinus mucosal thickening. Large left maxillary sinus mucous retention cyst. There is a small defect in the left posterior inferior lamina papyracea with herniation of extraconal fat into the roof of the left maxillary sinus, likely chronic fracture. Other: None. ASPECTS [facility] Stroke Program Early CT Score) - Ganglionic level infarction (caudate, lentiform nuclei, internal capsule, insula, M1-M3 cortex): 7 - Supraganglionic infarction (M4-M6 cortex): 3 Total score (0-10 with 10 being normal): 10 IMPRESSION: 1. No large acute stroke, hemorrhage, or mass effect. 2. ASPECTS is 10 3. Multiple chronic lacunar infarcts in the right greater than left basal ganglia and age advanced chronic microvascular ischemic changes of the brain. A small acute infarct may be obscured given advanced chronic changes. No prior study for comparison. These results were called by telephone at the time of interpretation on 10/26/2018 at 4:48 am to Dr. Pryor Curia , who verbally acknowledged these results. Electronically Signed   By: Kristine Garbe M.D.   On: 10/26/2018 04:54   Ct Maxillofacial Wo Contrast  Result Date: 10/26/2018 CLINICAL DATA:  Blunt maxillofacial trauma EXAM: CT MAXILLOFACIAL WITHOUT CONTRAST TECHNIQUE: Multidetector CT imaging of the maxillofacial structures was performed. Multiplanar CT image reconstructions were also generated. COMPARISON:  None. FINDINGS: Osseous: Significantly degraded by motion artifact but maxillofacial structures were visualized on preceding CTA of the head and neck. Remote appearing, mildly depressed fractures of the anterior wall left maxillary sinus and left zygomatic arch. Remote floor fracture of the left orbit. Remote nasal arch fractures. No acute fracture is seen. Orbits: No emergent finding. Chronic appearing orbital fat herniation into the upper maxillary sinus posteriorly. Sinuses: No evident  contusion or opaque foreign body. Soft tissues: No  hemosinus. Secretions layer in the left maxillary sinus. Limited intracranial: Negative IMPRESSION: 1. Significantly motion degraded but correlation is made with preceding CTA. 2. Remote appearing fractures of the nasal arch, left orbital floor, left maxillary sinus, and left zygoma. Electronically Signed   By: Monte Fantasia M.D.   On: 10/26/2018 06:03    EKG: Independently reviewed.  Normal sinus rhythm.  Comparable to previous EKGs.  Assessment/Plan Principal Problem:   TIA (transient ischemic attack) Active Problems:   Essential hypertension   Hyperlipidemia   Smoker   Insomnia   Left hemiplegia/TIA: Improved neurological symptoms.  Given severity of symptoms, admit to stroke unit.  Vital signs and neuro checks as per the stroke protocol.  Telemetry monitoring to catch any arrhythmias.  Currently neurologically stable.  MRI of the brain, 2D echocardiogram will be done.  Patient already had CTA of the head and neck.  Will be followed by neurology.  PT OT and speech consultation.  Patient passed bedside swallow evaluation. As neuro recommendation, loaded with Plavix and aspirin, will continue aspirin 81 mg and Plavix 75 mg daily.  Will check lipid profile.  Start on Lipitor 40 mg.  Check A1c.  Insomnia: Patient was repeatedly using additional doses of sleep medicine.  I discussed in detail with patient and family about limiting doses of sleep medicine as that can cause sleepiness at night and potential for fall.  Smoker: Counseled to quit.  Holding nicotine patch.  Hypertension: Fairly stable.  He just on hydrochlorothiazide.  With no evidence of acute infarction on CT scan, will allow normal blood pressure.  Resume hydrochlorothiazide.  Replace potassium.  Prostatism: On Flomax and oxybutynin that he will continue.  Chronic pain syndrome: On multiple medications including gabapentin and muscle relaxant.  Will decrease use of muscle  relaxant.  DVT prophylaxis: Lovenox. Code Status: Full code. Family Communication: Mother and sister at the bedside. Disposition Plan: Home with outpatient PT. Consults called: Neurology. Admission status: Observation, telemetry.   Barb Merino MD [facility] Pager [phone]  If 7PM-7AM, please contact night-coverage [email or web address] Password [facility]  10/26/2018, 7:52 AM

## 2018-10-27 DIAGNOSIS — I63311 Cerebral infarction due to thrombosis of right middle cerebral artery: Secondary | ICD-10-CM | POA: Diagnosis not present

## 2018-10-27 DIAGNOSIS — F17200 Nicotine dependence, unspecified, uncomplicated: Secondary | ICD-10-CM | POA: Diagnosis not present

## 2018-10-27 DIAGNOSIS — I10 Essential (primary) hypertension: Secondary | ICD-10-CM | POA: Diagnosis not present

## 2018-10-27 DIAGNOSIS — I639 Cerebral infarction, unspecified: Secondary | ICD-10-CM | POA: Diagnosis not present

## 2018-10-27 DIAGNOSIS — E785 Hyperlipidemia, unspecified: Secondary | ICD-10-CM | POA: Diagnosis not present

## 2018-10-27 LAB — LIPID PANEL
Cholesterol: 194 mg/dL (ref 0–200)
HDL: 47 mg/dL (ref >40)
LDL Cholesterol: 104 mg/dL — ABNORMAL HIGH (ref 0–99)
Total CHOL/HDL Ratio: 4.1 RATIO
Triglycerides: 217 mg/dL — ABNORMAL HIGH (ref <150)
VLDL: 43 mg/dL — ABNORMAL HIGH (ref 0–40)

## 2018-10-27 LAB — HEMOGLOBIN A1C
Hgb A1c MFr Bld: 5.1 % (ref 4.8–5.6)
Mean Plasma Glucose: 99.67 mg/dL

## 2018-10-27 LAB — HIV ANTIBODY (ROUTINE TESTING W REFLEX): HIV Screen 4th Generation wRfx: NONREACTIVE

## 2018-10-27 MED ORDER — ATORVASTATIN CALCIUM 40 MG PO TABS: 40.0000 mg | ORAL_TABLET | Freq: Every day | ORAL | 0 refills | Status: DC

## 2018-10-27 MED ORDER — CLOPIDOGREL BISULFATE 75 MG PO TABS: 75.0000 mg | ORAL_TABLET | Freq: Every day | ORAL | 0 refills | Status: DC

## 2018-10-27 MED ORDER — TIMOLOL MALEATE 0.5 % OP SOLN
1.0000 [drp] | Freq: Every day | OPHTHALMIC | Status: DC
  Administered 2018-10-27: 1 [drp] via OPHTHALMIC
  Filled 2018-10-27: qty 5

## 2018-10-27 MED ORDER — LATANOPROST 0.005 % OP SOLN
1.0000 [drp] | Freq: Every day | OPHTHALMIC | Status: DC
  Filled 2018-10-27: qty 2.5

## 2018-10-27 MED ORDER — ASPIRIN 81 MG PO CHEW: 81.0000 mg | CHEWABLE_TABLET | Freq: Every day | ORAL | Status: DC

## 2018-10-27 NOTE — Evaluation (Signed)
Physical Therapy Evaluation Patient Details Name: Mark Dunlap MRN: [id number] DOB: 1961-08-13 Today's Date: 10/27/2018   History of Present Illness  Pt is a 58 yo male admitted after getting up in middle of night and feeling unsteady. Pt fell x3 at home and had L side weakness and L facial droop.  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 Pt with pain in L hip from fall. Has THR in L but xray is negative.  Clinical Impression  Pt admitted with above diagnosis. Pt currently with functional limitations due to the deficits listed below (see PT Problem List). Pt generally anxious about life stressors including work and caring for 38 yo mother, spent some times discussing stress mgmt including [facility], help from siblings for his mom, and smoking cessation. Pt ambulated 300' with no AD and supervision, antalgic gait noted due to L hip pain and mild balance deficits noted when pt challenged. Will follow acutely but do not anticipate needing PT after d/c.  Pt will benefit from skilled PT to increase their independence and safety with mobility to allow discharge to the venue listed below.       Follow Up Recommendations No PT follow up    Equipment Recommendations  None recommended by PT    Recommendations for Other Services       Precautions / Restrictions Precautions Precautions: Fall Restrictions Weight Bearing Restrictions: No      Mobility  Bed Mobility Overal bed mobility: Modified Independent                Transfers Overall transfer level: Needs assistance Equipment used: None Transfers: Sit to/from Stand Sit to Stand: Supervision         General transfer comment: L hip pain with standing, takes additional time to get adjusted to standing and get balance  Ambulation/Gait Ambulation/Gait assistance: Supervision Gait Distance (Feet): 300 Feet Assistive device: None Gait Pattern/deviations: Antalgic Gait velocity:  decreased Gait velocity interpretation: 1.31 - 2.62 ft/sec, indicative of limited community ambulator General Gait Details: L hip pain obvious with ambulation  Stairs            Wheelchair Mobility    Modified Rankin (Stroke Patients Only) Modified Rankin (Stroke Patients Only) Pre-Morbid Rankin Score: No symptoms Modified Rankin: Slight disability     Balance Overall balance assessment: Needs assistance Sitting-balance support: No upper extremity supported Sitting balance-Leahy Scale: Normal     Standing balance support: No upper extremity supported Standing balance-Leahy Scale: Good Standing balance comment: limitations evident when pt is challenged, worked on bkwd and sidestepping gait to challenge balance                             Pertinent Vitals/Pain Pain Assessment: Faces Faces Pain Scale: Hurts even more Pain Location: L hip from fall Pain Descriptors / Indicators: Aching Pain Intervention(s): Limited activity within patient's tolerance;Monitored during session    Home Living Family/patient expects to be discharged to:: Private residence Living Arrangements: Parent Available Help at Discharge: Family;Available 24 hours/day Type of Home: House Home Access: Level entry     Home Layout: One level Home Equipment: Shower seat - built in      Prior Function Level of Independence: Independent         Comments: Works full time for [employer] Hand: Right    Extremity/Trunk  Assessment   Upper Extremity Assessment Upper Extremity Assessment: Defer to OT evaluation    Lower Extremity Assessment Lower Extremity Assessment: LLE deficits/detail LLE Deficits / Details: posterior hip soreness with resisted hip flexion and knee ext. Hip flex 3+/5, knee ext 4/5, knee flex 4/5 LLE Sensation: WNL LLE Coordination: WNL    Cervical / Trunk Assessment Cervical / Trunk Assessment: Normal   Communication   Communication: No difficulties  Cognition Arousal/Alertness: Awake/alert Behavior During Therapy: WFL for tasks assessed/performed Overall Cognitive Status: Within Functional Limits for tasks assessed                                 General Comments: quite verbose, sometimes hard to get him to focus, very internally focused on the current stressors that he has including working full time and caring for 92 to mother at home      General Comments General comments (skin integrity, edema, etc.): discussed stress mgmt and smoking cessation at length    Exercises     Assessment/Plan    PT Assessment Patient needs continued PT services  PT Problem List Decreased strength;Decreased balance;Decreased mobility;Pain       PT Treatment Interventions Gait training;Stair training;Functional mobility training;Therapeutic activities;Therapeutic exercise;Balance training;Neuromuscular re-education;Patient/family education    PT Goals (Current goals can be found in the Care Plan section)  Acute Rehab PT Goals Patient Stated Goal: to go home PT Goal Formulation: With patient Time For Goal Achievement: 11/10/18 Potential to Achieve Goals: Good Additional Goals Additional Goal #1: Pt to score >52/56 on Berg    Frequency Min 3X/week   Barriers to discharge        Co-evaluation               AM-PAC PT "6 Clicks" Mobility  Outcome Measure Help needed turning from your back to your side while in a flat bed without using bedrails?: None Help needed moving from lying on your back to sitting on the side of a flat bed without using bedrails?: None Help needed moving to and from a bed to a chair (including a wheelchair)?: None Help needed standing up from a chair using your arms (e.g., wheelchair or bedside chair)?: A Little Help needed to walk in hospital room?: A Little Help needed climbing 3-5 steps with a railing? : A Little 6 Click Score: 21    End of  Session   Activity Tolerance: Patient tolerated treatment well Patient left: in bed;with call bell/phone within reach Nurse Communication: Mobility status PT Visit Diagnosis: Unsteadiness on feet (R26.81);Pain;Hemiplegia and hemiparesis Hemiplegia - Right/Left: Left Hemiplegia - dominant/non-dominant: Non-dominant Hemiplegia - caused by: Cerebral infarction Pain - Right/Left: Left Pain - part of body: Hip    Time: 2094-7096 PT Time Calculation (min) (ACUTE ONLY): 36 min   Charges:   PT Evaluation $PT Eval Moderate Complexity: 1 Mod PT Treatments $Gait Training: 8-22 mins        Barber  Pager [phone] Office [place] 10/27/2018, 9:52 AM

## 2018-10-27 NOTE — Care Management Obs Status (Signed)
[facility] NOTIFICATION   Patient Details  Name: Mark Dunlap MRN: [id number] Date of Birth: 11/22/1960   Medicare Observation Status Notification Given:  Yes    Midge Minium RN, BSN, NCM-BC, ACM-RN [phone] 10/27/2018, 2:45 PM

## 2018-10-27 NOTE — Progress Notes (Addendum)
STROKE TEAM PROGRESS NOTE   INTERVAL HISTORY His mother is at the bedside.  Patient feels back to baseline. Ready for d/c. Reports he has no therapy needs.   Vitals:   10/26/18 1800 10/26/18 1942 10/27/18 0002 10/27/18 0615  BP: (!) 164/89 (!) 148/98 134/90 (!) 125/91  Pulse: 82 80 85 78  Resp: 18 18 18    Temp: 98.3 F (36.8 C) 97.8 F (36.6 C) 98.4 F (36.9 C) 98 F (36.7 C)  TempSrc: Oral Oral Oral Oral  SpO2: 98% 100% 99% 98%  Weight:      Height:        CBC:  Recent Labs  Lab 10/26/18 0435 10/26/18 0443  WBC 10.6*  --   NEUTROABS 7.2  --   HGB 13.8 15.3  HCT 44.7 45.0  MCV 94.3  --   PLT 327  --     Basic Metabolic Panel:  Recent Labs  Lab 10/26/18 0435 10/26/18 0443  NA 137 138  K 3.5 3.4*  CL 104 103  CO2 24  --   GLUCOSE 104* 101*  BUN 12 14  CREATININE 1.13 1.10  CALCIUM 9.2  --    Lipid Panel:     Component Value Date/Time   CHOL 194 10/27/2018 0547   TRIG 217 (H) 10/27/2018 0547   HDL 47 10/27/2018 0547   CHOLHDL 4.1 10/27/2018 0547   VLDL 43 (H) 10/27/2018 0547   LDLCALC 104 (H) 10/27/2018 0547   HgbA1c:  Lab Results  Component Value Date   HGBA1C 5.1 10/27/2018   Urine Drug Screen:     Component Value Date/Time   LABOPIA POSITIVE (A) 10/26/2018 0611   COCAINSCRNUR NONE DETECTED 10/26/2018 0611   LABBENZ NONE DETECTED 10/26/2018 0611   AMPHETMU NONE DETECTED 10/26/2018 0611   THCU NONE DETECTED 10/26/2018 0611   LABBARB NONE DETECTED 10/26/2018 0611    Alcohol Level No results found for: ETH  IMAGING Ct Angio Head W Or Wo Contrast  Result Date: 10/26/2018 CLINICAL DATA:  Post stroke.  Left arm and leg weakness EXAM: CT ANGIOGRAPHY HEAD AND NECK CT PERFUSION BRAIN TECHNIQUE: Multidetector CT imaging of the head and neck was performed using the standard protocol during bolus administration of intravenous contrast. Multiplanar CT image reconstructions and MIPs were obtained to evaluate the vascular anatomy. Carotid stenosis  measurements (when applicable) are obtained utilizing NASCET criteria, using the distal internal carotid diameter as the denominator. Multiphase CT imaging of the brain was performed following IV bolus contrast injection. Subsequent parametric perfusion maps were calculated using RAPID software. CONTRAST:  134mL ISOVUE-370 IOPAMIDOL (ISOVUE-370) INJECTION 76% COMPARISON:  Head CT from earlier today FINDINGS: CTA NECK FINDINGS Aortic arch: Atherosclerotic plaque.  Three vessel branching. Right carotid system: Vessels are smooth and widely patent. Limited atheromatous changes at the bifurcation. Left carotid system: Vessels are smooth and widely patent. Moderate calcified plaque at the ICA bulb. Vertebral arteries: No proximal subclavian stenosis. Plaque at both vertebral ostia of the vertebral arteries are widely patent and smooth all the way to the dura. Skeleton: C3-C7 ACDF. Solid arthrodesis is seen except at C6-7 with ACDF hardware is well located but there is clear lucency around the C7 lateral mass screws. No acute osseous finding. Other neck: No emergent finding Upper chest: Biapical scar-like appearance Review of the MIP images confirms the above findings CTA HEAD FINDINGS Anterior circulation: Atherosclerotic plaque on the carotid siphons. There is suboptimal bolus density but still diagnostic for excluding large vessel occlusion. No flow limiting  stenosis or aneurysm seen. Posterior circulation: Symmetric vertebral arteries. The vertebral and basilar arteries are smooth and widely patent. Symmetric flow with no evident branch occlusion Venous sinuses: Patent Anatomic variants: None significant Delayed phase: Not obtained in the emergent setting. Review of the MIP images confirms the above findings CT Brain Perfusion Findings: CBF (<30%) Volume: 66mL Perfusion (Tmax>6.0s) volume: 25mL These results were communicated to Dr. Leonel Dunlap at 5:12 amon 1/13/2020by text page via the [facility] messaging system.  IMPRESSION: 1. No emergent large vessel occlusion. No infarct or ischemia by CT perfusion. 2. Cervical and intracranial atherosclerosis without proximal flow limiting stenosis. 3. Suboptimal arterial bolus density. 4. C3-C7 fusion with solid arthrodesis except at C6-7 where there are findings of pseudoarthrosis. Electronically Signed   By: Mark Dunlap M.D.   On: 10/26/2018 05:12   Ct Angio Neck W Or Wo Contrast  Result Date: 10/26/2018 CLINICAL DATA:  Post stroke.  Left arm and leg weakness EXAM: CT ANGIOGRAPHY HEAD AND NECK CT PERFUSION BRAIN TECHNIQUE: Multidetector CT imaging of the head and neck was performed using the standard protocol during bolus administration of intravenous contrast. Multiplanar CT image reconstructions and MIPs were obtained to evaluate the vascular anatomy. Carotid stenosis measurements (when applicable) are obtained utilizing NASCET criteria, using the distal internal carotid diameter as the denominator. Multiphase CT imaging of the brain was performed following IV bolus contrast injection. Subsequent parametric perfusion maps were calculated using RAPID software. CONTRAST:  148mL ISOVUE-370 IOPAMIDOL (ISOVUE-370) INJECTION 76% COMPARISON:  Head CT from earlier today FINDINGS: CTA NECK FINDINGS Aortic arch: Atherosclerotic plaque.  Three vessel branching. Right carotid system: Vessels are smooth and widely patent. Limited atheromatous changes at the bifurcation. Left carotid system: Vessels are smooth and widely patent. Moderate calcified plaque at the ICA bulb. Vertebral arteries: No proximal subclavian stenosis. Plaque at both vertebral ostia of the vertebral arteries are widely patent and smooth all the way to the dura. Skeleton: C3-C7 ACDF. Solid arthrodesis is seen except at C6-7 with ACDF hardware is well located but there is clear lucency around the C7 lateral mass screws. No acute osseous finding. Other neck: No emergent finding Upper chest: Biapical scar-like appearance  Review of the MIP images confirms the above findings CTA HEAD FINDINGS Anterior circulation: Atherosclerotic plaque on the carotid siphons. There is suboptimal bolus density but still diagnostic for excluding large vessel occlusion. No flow limiting stenosis or aneurysm seen. Posterior circulation: Symmetric vertebral arteries. The vertebral and basilar arteries are smooth and widely patent. Symmetric flow with no evident branch occlusion Venous sinuses: Patent Anatomic variants: None significant Delayed phase: Not obtained in the emergent setting. Review of the MIP images confirms the above findings CT Brain Perfusion Findings: CBF (<30%) Volume: 32mL Perfusion (Tmax>6.0s) volume: 39mL These results were communicated to Dr. Leonel Dunlap at 5:12 amon 1/13/2020by text page via the [facility] messaging system. IMPRESSION: 1. No emergent large vessel occlusion. No infarct or ischemia by CT perfusion. 2. Cervical and intracranial atherosclerosis without proximal flow limiting stenosis. 3. Suboptimal arterial bolus density. 4. C3-C7 fusion with solid arthrodesis except at C6-7 where there are findings of pseudoarthrosis. Electronically Signed   By: Mark Dunlap M.D.   On: 10/26/2018 05:12   Ct Cervical Spine Wo Contrast  Result Date: 10/26/2018 CLINICAL DATA:  Multiple falls in the past week. EXAM: CT CERVICAL SPINE WITHOUT CONTRAST TECHNIQUE: Multidetector CT imaging of the cervical spine was performed without intravenous contrast. Multiplanar CT image reconstructions were also generated. COMPARISON:  Cervical myelogram 03/25/2014 FINDINGS: Alignment:  Physiologic alignment Skull base and vertebrae: Negative for fracture. Soft tissues and spinal canal: No prevertebral fluid or swelling. No visible canal hematoma. Disc levels: C3-C7 posterior rod and screw fixation. Chronic lucency around the C7 lateral mass screws with some ingrowth of bone since 2015 suggesting that an interval C6-7 ACDF has achieved solid arthrodesis  (although difficult to demonstrate on reformats). No ACDF complication. There is solid interbody arthrodesis from C3-C6. C2-3 central disc protrusion with buttressing osteophyte and ligamentum flavum thickening that causes flattening of the cord Upper chest: No acute finding IMPRESSION: 1. No emergent finding. 2. Postoperative changes as described. 3. 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 Electronically Signed   By: Mark Dunlap M.D.   On: 10/26/2018 05:59   Mr Brain Wo Contrast  Result Date: 10/26/2018 CLINICAL DATA:  Unsteady gait and falling. Left-sided weakness and facial droop. EXAM: MRI HEAD WITHOUT CONTRAST TECHNIQUE: Multiplanar, multiecho pulse sequences of the brain and surrounding structures were obtained without intravenous contrast. COMPARISON:  Multiple CT studies earlier same day FINDINGS: Brain: Diffusion imaging shows acute infarction in the right basal ganglia corpus striatum. No evidence of swelling or hemorrhage at this time. No other acute insult. The brainstem and cerebellum are normal. Moderate chronic small-vessel ischemic changes elsewhere affect the cerebral hemispheric deep and subcortical white matter. No large vessel territory infarction. No mass lesion, hemorrhage, hydrocephalus or extra-axial collection. Minimal petechial blood products in the right basal ganglia. Vascular: Major vessels at the base of the brain show flow. Skull and upper cervical spine: Negative Sinuses/Orbits: Layering fluid in the left maxillary sinus consistent with rhinosinusitis. Orbits negative. Other: None IMPRESSION: Acute infarction affecting the right basal ganglia corpus striatum. Minimal petechial blood products but no frank hematoma. No mass effect. Moderate pre-existing small-vessel ischemic changes elsewhere throughout the brain. Electronically Signed   By: Mark Dunlap M.D.   On: 10/26/2018 13:43   Ct Cerebral Perfusion W Contrast  Result Date: 10/26/2018 CLINICAL DATA:  Post stroke.  Left arm  and leg weakness EXAM: CT ANGIOGRAPHY HEAD AND NECK CT PERFUSION BRAIN TECHNIQUE: Multidetector CT imaging of the head and neck was performed using the standard protocol during bolus administration of intravenous contrast. Multiplanar CT image reconstructions and MIPs were obtained to evaluate the vascular anatomy. Carotid stenosis measurements (when applicable) are obtained utilizing NASCET criteria, using the distal internal carotid diameter as the denominator. Multiphase CT imaging of the brain was performed following IV bolus contrast injection. Subsequent parametric perfusion maps were calculated using RAPID software. CONTRAST:  150mL ISOVUE-370 IOPAMIDOL (ISOVUE-370) INJECTION 76% COMPARISON:  Head CT from earlier today FINDINGS: CTA NECK FINDINGS Aortic arch: Atherosclerotic plaque.  Three vessel branching. Right carotid system: Vessels are smooth and widely patent. Limited atheromatous changes at the bifurcation. Left carotid system: Vessels are smooth and widely patent. Moderate calcified plaque at the ICA bulb. Vertebral arteries: No proximal subclavian stenosis. Plaque at both vertebral ostia of the vertebral arteries are widely patent and smooth all the way to the dura. Skeleton: C3-C7 ACDF. Solid arthrodesis is seen except at C6-7 with ACDF hardware is well located but there is clear lucency around the C7 lateral mass screws. No acute osseous finding. Other neck: No emergent finding Upper chest: Biapical scar-like appearance Review of the MIP images confirms the above findings CTA HEAD FINDINGS Anterior circulation: Atherosclerotic plaque on the carotid siphons. There is suboptimal bolus density but still diagnostic for excluding large vessel occlusion. No flow limiting stenosis or aneurysm seen. Posterior circulation: Symmetric vertebral arteries. The vertebral  and basilar arteries are smooth and widely patent. Symmetric flow with no evident branch occlusion Venous sinuses: Patent Anatomic variants:  None significant Delayed phase: Not obtained in the emergent setting. Review of the MIP images confirms the above findings CT Brain Perfusion Findings: CBF (<30%) Volume: 75mL Perfusion (Tmax>6.0s) volume: 61mL These results were communicated to Dr. Leonel Dunlap at 5:12 amon 1/13/2020by text page via the [facility] messaging system. IMPRESSION: 1. No emergent large vessel occlusion. No infarct or ischemia by CT perfusion. 2. Cervical and intracranial atherosclerosis without proximal flow limiting stenosis. 3. Suboptimal arterial bolus density. 4. C3-C7 fusion with solid arthrodesis except at C6-7 where there are findings of pseudoarthrosis. Electronically Signed   By: Mark Dunlap M.D.   On: 10/26/2018 05:12   Dg Hip Unilat W Or Wo Pelvis 2-3 Views Left  Result Date: 10/26/2018 CLINICAL DATA:  57 y/o  M; left hip pain, fall, and stroke. EXAM: DG HIP (WITH OR WITHOUT PELVIS) 2-3V LEFT COMPARISON:  None. FINDINGS: There is no evidence of hip fracture or dislocation. Left total hip arthroplasty. No periprosthetic lucency or fracture identified. Partially visualized lower lumbar fusion hardware. IMPRESSION: 1. No acute fracture or dislocation identified. 2. Left hip prosthesis.  No apparent hardware related complication. Electronically Signed   By: Mark Dunlap M.D.   On: 10/26/2018 06:08   Ct Head Code Stroke Wo Contrast  Result Date: 10/26/2018 CLINICAL DATA:  Code stroke. 58 y/o M; left-sided leg and arm weakness as well as left facial droop. EXAM: CT HEAD WITHOUT CONTRAST TECHNIQUE: Contiguous axial images were obtained from the base of the skull through the vertex without intravenous contrast. COMPARISON:  None. FINDINGS: Brain: No evidence of large vascular territory acute infarction, hemorrhage, hydrocephalus, extra-axial collection or mass lesion/mass effect. There are several lucencies in the basal ganglia including bilateral thalami, right caudate head, and right anterior lentiform nucleus,  probably representing chronic lacunar infarcts. Additionally, there are numerous hypodensities in white matter compatible with chronic microvascular ischemic changes, age advanced. Vascular: Calcific atherosclerosis of carotid siphons. No hyperdense vessel identified. Skull: Normal. Negative for fracture or focal lesion. Sinuses/Orbits: Mild right maxillary sinus mucosal thickening. Large left maxillary sinus mucous retention cyst. There is a small defect in the left posterior inferior lamina papyracea with herniation of extraconal fat into the roof of the left maxillary sinus, likely chronic fracture. Other: None. ASPECTS [facility] Stroke Program Early CT Score) - Ganglionic level infarction (caudate, lentiform nuclei, internal capsule, insula, M1-M3 cortex): 7 - Supraganglionic infarction (M4-M6 cortex): 3 Total score (0-10 with 10 being normal): 10 IMPRESSION: 1. No large acute stroke, hemorrhage, or mass effect. 2. ASPECTS is 10 3. Multiple chronic lacunar infarcts in the right greater than left basal ganglia and age advanced chronic microvascular ischemic changes of the brain. A small acute infarct may be obscured given advanced chronic changes. No prior study for comparison. These results were called by telephone at the time of interpretation on 10/26/2018 at 4:48 am to Dr. Pryor Dunlap , who verbally acknowledged these results. Electronically Signed   By: Mark Dunlap M.D.   On: 10/26/2018 04:54   Ct Maxillofacial Wo Contrast  Result Date: 10/26/2018 CLINICAL DATA:  Blunt maxillofacial trauma EXAM: CT MAXILLOFACIAL WITHOUT CONTRAST TECHNIQUE: Multidetector CT imaging of the maxillofacial structures was performed. Multiplanar CT image reconstructions were also generated. COMPARISON:  None. FINDINGS: Osseous: Significantly degraded by motion artifact but maxillofacial structures were visualized on preceding CTA of the head and neck. Remote appearing, mildly depressed fractures of the  anterior wall  left maxillary sinus and left zygomatic arch. Remote floor fracture of the left orbit. Remote nasal arch fractures. No acute fracture is seen. Orbits: No emergent finding. Chronic appearing orbital fat herniation into the upper maxillary sinus posteriorly. Sinuses: No evident contusion or opaque foreign body. Soft tissues: No hemosinus. Secretions layer in the left maxillary sinus. Limited intracranial: Negative IMPRESSION: 1. Significantly motion degraded but correlation is made with preceding CTA. 2. Remote appearing fractures of the nasal arch, left orbital floor, left maxillary sinus, and left zygoma. Electronically Signed   By: Mark Dunlap M.D.   On: 10/26/2018 06:03   2D Echocardiogram  - Left ventricle: The cavity size was normal. Wall thickness was increased in a pattern of mild LVH. Systolic function was normal. The estimated ejection fraction was in the range of 60% to 65%. Wall motion was normal; there were no regional wall motion abnormalities. Left ventricular diastolic function parameters were normal. - Left atrium: The atrium was normal in size. - Inferior vena cava: The vessel was normal in size. The respirophasic diameter changes were in the normal range (>= 50%), consistent with normal central venous pressure. Impressions:  Normal study.   PHYSICAL EXAM Constitutional: Appears well-developed and well-nourished.  Psych: Affect appropriate to situation Eyes: No scleral injection Head: Normocephalic.  Cardiovascular: Normal rate and regular rhythm.  Respiratory: Effort normal, non-labored breathing Skin: WDI  Neuro: Mental Status: Patient is awake, alert, oriented to person, place, month, year, and situation. Patient is able to give a clear and coherent history. No signs of aphasia or neglect Cranial Nerves: II: Visual Fields are full. Pupils are equal, round, and reactive to light.   III,IV, VI: EOMI without ptosis or diploplia.  V: Facial sensation is symmetric to  temperature VII: Facial movement is symmetric VIII: hearing is intact to voice X: Uvula elevates symmetrically XI: Shoulder shrug is symmetric. XII: tongue is midline without atrophy or fasciculations.  Motor: Tone is normal. Bulk is normal. Strength 5/5 all extremities. no orbiting. FMM ok Sensory: Sensation is symmetric to light touch and temperature in the arms and legs. Cerebellar: No ataxia B FNF   ASSESSMENT/PLAN [name] is a 58 y.o. 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 R>L old BG lacumes. Advanced small vessel disease. ASPECTS 10.     CTA head & neck no ELVO. C  CT perfusion no ischemia  CT CS C2-3 cord impingement. C3-C7 fusion w/ solid arthodesis x at C6-7 w/ pseudoarthrosis. No emergent finding  MRI  R BG/CR infarct. Moderate Small vessel disease.   2D Echo  normal  LDL 104  HgbA1c 5.1  Lovenox 40 mg sq daily for VTE prophylaxis  aspirin 81 mg daily prior to admission, now on aspirin 81 mg daily and clopidogrel 75 mg daily. Continue DAPT x 3 weeks then plavix alone  Therapy recommendations:  No therapy needs  Disposition:  Return home  Works in a warehouse/shipping environment. Recommend no work this week and gradual return to worse next week if he feels better [facility] for d/c from stroke standpoint Follow-up Stroke Clinic at [facility] in 4 weeks. Office will call with appointment date and time. Order placed.  Hypertension  Stable . Permissive hypertension (OK if < 220/120) but gradually normalize  in 5-7 days . Long-term BP goal normotensive  Hyperlipidemia  Home meds:  No statin  Now on lipitor 40  LDL 104, goal < 70  Continue statin at discharge  Other Stroke Risk Factors  Cigarette  smoker, advised to stop smoking - he is agreeable but states he just wants 2 draws a day. He will see  Advised not to use drugs  Other Active Problems  Insomnia  Prostatism  Chronic pain syndrome  Hospital day # 0  Mark Sabin, MSN, APRN, ANVP-BC, AGPCNP-BC Advanced Practice Stroke Nurse [place] for Schedule & Pager information 10/27/2018 2:31 PM   ATTENDING NOTE: I reviewed above note and agree with the assessment and plan. Pt was seen and examined.   58 year old male with history of hypertension, remote cocaine use, left eye legally blind due to glaucoma and smoker admitted for left-sided weakness.  Now resolved.  CT showed no acute abnormality but old lacunar infarct at the bilateral BG, right more than left.  CTA head and neck unremarkable.  CT perfusion negative.  MRI showed right BG and CR infarcts.  2D echo EF 60 to 65%.  UDS positive for opiates.  LDL 104 and A1c 5.1.  Patient stroke most likely due to small vessel disease given stroke location and risk factors.  Recommend to continue aspirin 81 and the Plavix for 3 weeks and then aspirin 81 alone for stroke prevention.  Continue Lipitor 40 for hyperlipidemia and stroke prevention.  Aggressive stroke risk factor modification. Quit smoking  Neurology will sign off. Please call with questions. Pt will follow up with stroke clinic NP at [facility] in about 4 weeks. Thanks for the consult.   Mark Hawking, MD PhD Stroke Neurology 10/27/2018 2:46 PM     To contact Stroke Continuity provider, please refer to [email or web address] After hours, contact General Neurology

## 2018-10-27 NOTE — Discharge Summary (Signed)
Physician Discharge Summary  BRETTON TANDY EVO:[id number] DOB: 12-11-1960 DOA: 10/26/2018  PCP: Biagio Borg, MD  Admit date: 10/26/2018 Discharge date: 10/27/2018  Admitted From: home Discharge disposition: home   Recommendations for Outpatient Follow-Up:   1. ASA/plavix x 3 weeks then ASA alone (will need clarification when following up with neurology as initially it was ASA alone but note change reflects plavix alone) 2. Patient to re-schedule EGD for when off plavix   Discharge Diagnosis:   Principal Problem:   cva Active Problems:   Essential hypertension   Hyperlipidemia   Smoker   Insomnia    Discharge Condition: Improved.  Diet recommendation: Low sodium, heart healthy.  Carbohydrate-modified.  Regular.  Wound care: None.  Code status: Full.   History of Present Illness:   Mark Dunlap is a 58 y.o. male with medical history significant of smoking, hypertension, hyperlipidemia not on treatment, crack cocaine abuse and prostatism who presents to the emergency room with episode of sudden weakness at night.  According to the patient, they watched game last night until 10 PM, he took Ambien to go to bed, little bit later he did not get sleep so he took another Ambien to try to sleep.  At about 2 AM in the morning, he thought he has to get ready to go to work so he went to the bathroom, he lost balance and fell.  He managed to come back to the bedroom then again went back to shower where he could not maintain his balance.  Patient does not quite remember that he had weakness on one side.  He just could not keep up his balance.  His mother lives with him.  They called 911 and patient was brought to the ER.  EMS reported that patient was unable to move his left leg.  Patient himself is currently sleepy.  Probably sleep deprived and double dose of Ambien.  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  He denies any symptoms currently.  He is able to swallow.      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 Moderate Small vessel disease.   2D Echo  normal  LDL 104- statin started  HgbA1c 5.1 aspirin 81 mg daily and clopidogrel 75 mg daily. Continue DAPT x 3 weeks then plavix alone (this will need to be clarified with neurology at appointment   Hypertension  Resume home meds  Hyperlipidemia  LDL 104, goal < 70  lipitor  Tobacco abuse -encourage cessation   Medical Consultants:    neuro  Discharge Exam:   Vitals:   10/27/18 0615 10/27/18 1127  BP: (!) 125/91 (!) 152/96  Pulse: 78 76  Resp:  16  Temp: 98 F (36.7 C) 98.2 F (36.8 C)  SpO2: 98% 99%   Vitals:   10/26/18 1942 10/27/18 0002 10/27/18 0615 10/27/18 1127  BP: (!) 148/98 134/90 (!) 125/91 (!) 152/96  Pulse: 80 85 78 76  Resp: 18 18  16   Temp: 97.8 F (36.6 C) 98.4 F (36.9 C) 98 F (36.7 C) 98.2 F (36.8 C)  TempSrc: Oral Oral Oral Oral  SpO2: 100% 99% 98% 99%  Weight:      Height:        General exam: Appears calm and comfortable.   The results of significant diagnostics from this hospitalization (including imaging, microbiology, ancillary and laboratory) are  listed below for reference.     Procedures and Diagnostic Studies:   Ct Angio Head W Or Wo Contrast  Result Date: 10/26/2018 CLINICAL DATA:  Post stroke.  Left arm and leg weakness EXAM: CT ANGIOGRAPHY HEAD AND NECK CT PERFUSION BRAIN TECHNIQUE: Multidetector CT imaging of the head and neck was performed using the standard protocol during bolus administration of intravenous contrast. Multiplanar CT image reconstructions and MIPs were obtained to evaluate the vascular anatomy. Carotid stenosis measurements (when applicable) are obtained utilizing NASCET criteria, using the distal internal carotid diameter as the denominator. Multiphase CT imaging of the brain was performed following IV  bolus contrast injection. Subsequent parametric perfusion maps were calculated using RAPID software. CONTRAST:  147mL ISOVUE-370 IOPAMIDOL (ISOVUE-370) INJECTION 76% COMPARISON:  Head CT from earlier today FINDINGS: CTA NECK FINDINGS Aortic arch: Atherosclerotic plaque.  Three vessel branching. Right carotid system: Vessels are smooth and widely patent. Limited atheromatous changes at the bifurcation. Left carotid system: Vessels are smooth and widely patent. Moderate calcified plaque at the ICA bulb. Vertebral arteries: No proximal subclavian stenosis. Plaque at both vertebral ostia of the vertebral arteries are widely patent and smooth all the way to the dura. Skeleton: C3-C7 ACDF. Solid arthrodesis is seen except at C6-7 with ACDF hardware is well located but there is clear lucency around the C7 lateral mass screws. No acute osseous finding. Other neck: No emergent finding Upper chest: Biapical scar-like appearance Review of the MIP images confirms the above findings CTA HEAD FINDINGS Anterior circulation: Atherosclerotic plaque on the carotid siphons. There is suboptimal bolus density but still diagnostic for excluding large vessel occlusion. No flow limiting stenosis or aneurysm seen. Posterior circulation: Symmetric vertebral arteries. The vertebral and basilar arteries are smooth and widely patent. Symmetric flow with no evident branch occlusion Venous sinuses: Patent Anatomic variants: None significant Delayed phase: Not obtained in the emergent setting. Review of the MIP images confirms the above findings CT Brain Perfusion Findings: CBF (<30%) Volume: 56mL Perfusion (Tmax>6.0s) volume: 57mL These results were communicated to Dr. Leonel Ramsay at 5:12 amon 1/13/2020by text page via the [facility] messaging system. IMPRESSION: 1. No emergent large vessel occlusion. No infarct or ischemia by CT perfusion. 2. Cervical and intracranial atherosclerosis without proximal flow limiting stenosis. 3. Suboptimal arterial bolus  density. 4. C3-C7 fusion with solid arthrodesis except at C6-7 where there are findings of pseudoarthrosis. Electronically Signed   By: Monte Fantasia M.D.   On: 10/26/2018 05:12   Ct Angio Neck W Or Wo Contrast  Result Date: 10/26/2018 CLINICAL DATA:  Post stroke.  Left arm and leg weakness EXAM: CT ANGIOGRAPHY HEAD AND NECK CT PERFUSION BRAIN TECHNIQUE: Multidetector CT imaging of the head and neck was performed using the standard protocol during bolus administration of intravenous contrast. Multiplanar CT image reconstructions and MIPs were obtained to evaluate the vascular anatomy. Carotid stenosis measurements (when applicable) are obtained utilizing NASCET criteria, using the distal internal carotid diameter as the denominator. Multiphase CT imaging of the brain was performed following IV bolus contrast injection. Subsequent parametric perfusion maps were calculated using RAPID software. CONTRAST:  157mL ISOVUE-370 IOPAMIDOL (ISOVUE-370) INJECTION 76% COMPARISON:  Head CT from earlier today FINDINGS: CTA NECK FINDINGS Aortic arch: Atherosclerotic plaque.  Three vessel branching. Right carotid system: Vessels are smooth and widely patent. Limited atheromatous changes at the bifurcation. Left carotid system: Vessels are smooth and widely patent. Moderate calcified plaque at the ICA bulb. Vertebral arteries: No proximal subclavian stenosis. Plaque at both vertebral ostia of the  vertebral arteries are widely patent and smooth all the way to the dura. Skeleton: C3-C7 ACDF. Solid arthrodesis is seen except at C6-7 with ACDF hardware is well located but there is clear lucency around the C7 lateral mass screws. No acute osseous finding. Other neck: No emergent finding Upper chest: Biapical scar-like appearance Review of the MIP images confirms the above findings CTA HEAD FINDINGS Anterior circulation: Atherosclerotic plaque on the carotid siphons. There is suboptimal bolus density but still diagnostic for  excluding large vessel occlusion. No flow limiting stenosis or aneurysm seen. Posterior circulation: Symmetric vertebral arteries. The vertebral and basilar arteries are smooth and widely patent. Symmetric flow with no evident branch occlusion Venous sinuses: Patent Anatomic variants: None significant Delayed phase: Not obtained in the emergent setting. Review of the MIP images confirms the above findings CT Brain Perfusion Findings: CBF (<30%) Volume: 19mL Perfusion (Tmax>6.0s) volume: 40mL These results were communicated to Dr. Leonel Ramsay at 5:12 amon 1/13/2020by text page via the [facility] messaging system. IMPRESSION: 1. No emergent large vessel occlusion. No infarct or ischemia by CT perfusion. 2. Cervical and intracranial atherosclerosis without proximal flow limiting stenosis. 3. Suboptimal arterial bolus density. 4. C3-C7 fusion with solid arthrodesis except at C6-7 where there are findings of pseudoarthrosis. Electronically Signed   By: Monte Fantasia M.D.   On: 10/26/2018 05:12   Ct Cervical Spine Wo Contrast  Result Date: 10/26/2018 CLINICAL DATA:  Multiple falls in the past week. EXAM: CT CERVICAL SPINE WITHOUT CONTRAST TECHNIQUE: Multidetector CT imaging of the cervical spine was performed without intravenous contrast. Multiplanar CT image reconstructions were also generated. COMPARISON:  Cervical myelogram 03/25/2014 FINDINGS: Alignment: Physiologic alignment Skull base and vertebrae: Negative for fracture. Soft tissues and spinal canal: No prevertebral fluid or swelling. No visible canal hematoma. Disc levels: C3-C7 posterior rod and screw fixation. Chronic lucency around the C7 lateral mass screws with some ingrowth of bone since 2015 suggesting that an interval C6-7 ACDF has achieved solid arthrodesis (although difficult to demonstrate on reformats). No ACDF complication. There is solid interbody arthrodesis from C3-C6. C2-3 central disc protrusion with buttressing osteophyte and ligamentum flavum  thickening that causes flattening of the cord Upper chest: No acute finding IMPRESSION: 1. No emergent finding. 2. Postoperative changes as described. 3. 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 Electronically Signed   By: Monte Fantasia M.D.   On: 10/26/2018 05:59   Mr Brain Wo Contrast  Result Date: 10/26/2018 CLINICAL DATA:  Unsteady gait and falling. Left-sided weakness and facial droop. EXAM: MRI HEAD WITHOUT CONTRAST TECHNIQUE: Multiplanar, multiecho pulse sequences of the brain and surrounding structures were obtained without intravenous contrast. COMPARISON:  Multiple CT studies earlier same day FINDINGS: Brain: Diffusion imaging shows acute infarction in the right basal ganglia corpus striatum. No evidence of swelling or hemorrhage at this time. No other acute insult. The brainstem and cerebellum are normal. Moderate chronic small-vessel ischemic changes elsewhere affect the cerebral hemispheric deep and subcortical white matter. No large vessel territory infarction. No mass lesion, hemorrhage, hydrocephalus or extra-axial collection. Minimal petechial blood products in the right basal ganglia. Vascular: Major vessels at the base of the brain show flow. Skull and upper cervical spine: Negative Sinuses/Orbits: Layering fluid in the left maxillary sinus consistent with rhinosinusitis. Orbits negative. Other: None IMPRESSION: Acute infarction affecting the right basal ganglia corpus striatum. Minimal petechial blood products but no frank hematoma. No mass effect. Moderate pre-existing small-vessel ischemic changes elsewhere throughout the brain. Electronically Signed   By: Jan Fireman.D.  On: 10/26/2018 13:43   Ct Cerebral Perfusion W Contrast  Result Date: 10/26/2018 CLINICAL DATA:  Post stroke.  Left arm and leg weakness EXAM: CT ANGIOGRAPHY HEAD AND NECK CT PERFUSION BRAIN TECHNIQUE: Multidetector CT imaging of the head and neck was performed using the standard protocol during bolus administration  of intravenous contrast. Multiplanar CT image reconstructions and MIPs were obtained to evaluate the vascular anatomy. Carotid stenosis measurements (when applicable) are obtained utilizing NASCET criteria, using the distal internal carotid diameter as the denominator. Multiphase CT imaging of the brain was performed following IV bolus contrast injection. Subsequent parametric perfusion maps were calculated using RAPID software. CONTRAST:  159mL ISOVUE-370 IOPAMIDOL (ISOVUE-370) INJECTION 76% COMPARISON:  Head CT from earlier today FINDINGS: CTA NECK FINDINGS Aortic arch: Atherosclerotic plaque.  Three vessel branching. Right carotid system: Vessels are smooth and widely patent. Limited atheromatous changes at the bifurcation. Left carotid system: Vessels are smooth and widely patent. Moderate calcified plaque at the ICA bulb. Vertebral arteries: No proximal subclavian stenosis. Plaque at both vertebral ostia of the vertebral arteries are widely patent and smooth all the way to the dura. Skeleton: C3-C7 ACDF. Solid arthrodesis is seen except at C6-7 with ACDF hardware is well located but there is clear lucency around the C7 lateral mass screws. No acute osseous finding. Other neck: No emergent finding Upper chest: Biapical scar-like appearance Review of the MIP images confirms the above findings CTA HEAD FINDINGS Anterior circulation: Atherosclerotic plaque on the carotid siphons. There is suboptimal bolus density but still diagnostic for excluding large vessel occlusion. No flow limiting stenosis or aneurysm seen. Posterior circulation: Symmetric vertebral arteries. The vertebral and basilar arteries are smooth and widely patent. Symmetric flow with no evident branch occlusion Venous sinuses: Patent Anatomic variants: None significant Delayed phase: Not obtained in the emergent setting. Review of the MIP images confirms the above findings CT Brain Perfusion Findings: CBF (<30%) Volume: 62mL Perfusion (Tmax>6.0s)  volume: 36mL These results were communicated to Dr. Leonel Ramsay at 5:12 amon 1/13/2020by text page via the [facility] messaging system. IMPRESSION: 1. No emergent large vessel occlusion. No infarct or ischemia by CT perfusion. 2. Cervical and intracranial atherosclerosis without proximal flow limiting stenosis. 3. Suboptimal arterial bolus density. 4. C3-C7 fusion with solid arthrodesis except at C6-7 where there are findings of pseudoarthrosis. Electronically Signed   By: Monte Fantasia M.D.   On: 10/26/2018 05:12   Dg Hip Unilat W Or Wo Pelvis 2-3 Views Left  Result Date: 10/26/2018 CLINICAL DATA:  58 y/o  M; left hip pain, fall, and stroke. EXAM: DG HIP (WITH OR WITHOUT PELVIS) 2-3V LEFT COMPARISON:  None. FINDINGS: There is no evidence of hip fracture or dislocation. Left total hip arthroplasty. No periprosthetic lucency or fracture identified. Partially visualized lower lumbar fusion hardware. IMPRESSION: 1. No acute fracture or dislocation identified. 2. Left hip prosthesis.  No apparent hardware related complication. Electronically Signed   By: Kristine Garbe M.D.   On: 10/26/2018 06:08   Ct Head Code Stroke Wo Contrast  Result Date: 10/26/2018 CLINICAL DATA:  Code stroke. 58 y/o M; left-sided leg and arm weakness as well as left facial droop. EXAM: CT HEAD WITHOUT CONTRAST TECHNIQUE: Contiguous axial images were obtained from the base of the skull through the vertex without intravenous contrast. COMPARISON:  None. FINDINGS: Brain: No evidence of large vascular territory acute infarction, hemorrhage, hydrocephalus, extra-axial collection or mass lesion/mass effect. There are several lucencies in the basal ganglia including bilateral thalami, right caudate head, and right  anterior lentiform nucleus, probably representing chronic lacunar infarcts. Additionally, there are numerous hypodensities in white matter compatible with chronic microvascular ischemic changes, age advanced. Vascular: Calcific  atherosclerosis of carotid siphons. No hyperdense vessel identified. Skull: Normal. Negative for fracture or focal lesion. Sinuses/Orbits: Mild right maxillary sinus mucosal thickening. Large left maxillary sinus mucous retention cyst. There is a small defect in the left posterior inferior lamina papyracea with herniation of extraconal fat into the roof of the left maxillary sinus, likely chronic fracture. Other: None. ASPECTS [facility] Stroke Program Early CT Score) - Ganglionic level infarction (caudate, lentiform nuclei, internal capsule, insula, M1-M3 cortex): 7 - Supraganglionic infarction (M4-M6 cortex): 3 Total score (0-10 with 10 being normal): 10 IMPRESSION: 1. No large acute stroke, hemorrhage, or mass effect. 2. ASPECTS is 10 3. Multiple chronic lacunar infarcts in the right greater than left basal ganglia and age advanced chronic microvascular ischemic changes of the brain. A small acute infarct may be obscured given advanced chronic changes. No prior study for comparison. These results were called by telephone at the time of interpretation on 10/26/2018 at 4:48 am to Dr. Pryor Curia , who verbally acknowledged these results. Electronically Signed   By: Kristine Garbe M.D.   On: 10/26/2018 04:54   Ct Maxillofacial Wo Contrast  Result Date: 10/26/2018 CLINICAL DATA:  Blunt maxillofacial trauma EXAM: CT MAXILLOFACIAL WITHOUT CONTRAST TECHNIQUE: Multidetector CT imaging of the maxillofacial structures was performed. Multiplanar CT image reconstructions were also generated. COMPARISON:  None. FINDINGS: Osseous: Significantly degraded by motion artifact but maxillofacial structures were visualized on preceding CTA of the head and neck. Remote appearing, mildly depressed fractures of the anterior wall left maxillary sinus and left zygomatic arch. Remote floor fracture of the left orbit. Remote nasal arch fractures. No acute fracture is seen. Orbits: No emergent finding. Chronic appearing orbital  fat herniation into the upper maxillary sinus posteriorly. Sinuses: No evident contusion or opaque foreign body. Soft tissues: No hemosinus. Secretions layer in the left maxillary sinus. Limited intracranial: Negative IMPRESSION: 1. Significantly motion degraded but correlation is made with preceding CTA. 2. Remote appearing fractures of the nasal arch, left orbital floor, left maxillary sinus, and left zygoma. Electronically Signed   By: Monte Fantasia M.D.   On: 10/26/2018 06:03     Labs:   Basic Metabolic Panel: Recent Labs  Lab 10/26/18 0435 10/26/18 0443  NA 137 138  K 3.5 3.4*  CL 104 103  CO2 24  --   GLUCOSE 104* 101*  BUN 12 14  CREATININE 1.13 1.10  CALCIUM 9.2  --    GFR Estimated Creatinine Clearance: 76.8 mL/min (by C-G formula based on SCr of 1.1 mg/dL). Liver Function Tests: Recent Labs  Lab 10/26/18 0435  AST 24  ALT 27  ALKPHOS 99  BILITOT 0.9  PROT 6.8  ALBUMIN 3.7   No results for input(s): LIPASE, AMYLASE in the last 168 hours. No results for input(s): AMMONIA in the last 168 hours. Coagulation profile Recent Labs  Lab 10/26/18 0435  INR 0.94    CBC: Recent Labs  Lab 10/26/18 0435 10/26/18 0443  WBC 10.6*  --   NEUTROABS 7.2  --   HGB 13.8 15.3  HCT 44.7 45.0  MCV 94.3  --   PLT 327  --    Cardiac Enzymes: No results for input(s): CKTOTAL, CKMB, CKMBINDEX, TROPONINI in the last 168 hours. BNP: Invalid input(s): POCBNP CBG: Recent Labs  Lab 10/26/18 0434  GLUCAP 96   D-Dimer No results  for input(s): DDIMER in the last 72 hours. Hgb A1c Recent Labs    10/27/18 0547  HGBA1C 5.1   Lipid Profile Recent Labs    10/27/18 0547  CHOL 194  HDL 47  LDLCALC 104*  TRIG 217*  CHOLHDL 4.1   Thyroid function studies No results for input(s): TSH, T4TOTAL, T3FREE, THYROIDAB in the last 72 hours.  Invalid input(s): FREET3 Anemia work up No results for input(s): VITAMINB12, FOLATE, FERRITIN, TIBC, IRON, RETICCTPCT in the last 72  hours. Microbiology No results found for this or any previous visit (from the past 240 hour(s)).   Discharge Instructions:   Discharge Instructions    Ambulatory referral to Neurology   Complete by:  As directed    Follow up with stroke clinic NP (Ival Pacer Vanschaick or Cecille Rubin, if both not available, consider Zachery Dauer, or [facility]) at [facility] in about 4 weeks. Thanks.   Diet - low sodium heart healthy   Complete by:  As directed    Discharge instructions   Complete by:  As directed    Asa/plavix x 3 weeks then ASA alone   Increase activity slowly   Complete by:  As directed      Allergies as of 10/27/2018      Reactions   Hydrocodone Itching, Other (See Comments)   Pt can tolerate   Tramadol Itching   Morphine And Related Itching   Oxycodone Itching      Medication List    STOP taking these medications   doxycycline 100 MG tablet Commonly known as:  VIBRA-TABS     TAKE these medications   acetaminophen 500 MG tablet Commonly known as:  TYLENOL Take 1,000 mg by mouth every 6 (six) hours as needed for moderate pain or headache.   adapalene 0.1 % gel Commonly known as:  DIFFERIN APPLY 1 APPLICATION TOPICALLY AT BEDTIME.   AMBULATORY NON FORMULARY MEDICATION Medication Name: GI Cocktail 90 ml-Viscous lidocaine 90 ml-Dicyclomine 10 mg/92ml 270 ml  Maalox Total ml's 450 ml Take 5-10 ml's and swallow as needed for breakthroughl.   aspirin 81 MG chewable tablet Chew 1 tablet (81 mg total) by mouth daily. Start taking on:  October 28, 2018   atorvastatin 40 MG tablet Commonly known as:  LIPITOR Take 1 tablet (40 mg total) by mouth daily at 6 PM.   benzoyl peroxide 5 % external liquid Commonly known as:  benzoyl peroxide Apply 1 application topically 2 (two) times daily.   bimatoprost 0.01 % Soln Commonly known as:  LUMIGAN Place 1 drop into both eyes at bedtime.   carboxymethylcellulose 0.5 % Soln Commonly known as:  REFRESH PLUS Place 1 drop into  both eyes 3 (three) times daily as needed (for clear eyes).   clopidogrel 75 MG tablet Commonly known as:  PLAVIX Take 1 tablet (75 mg total) by mouth daily. Start taking on:  October 28, 2018   diphenhydrAMINE 25 mg capsule Commonly known as:  BENADRYL Take 50 mg by mouth every 6 (six) hours as needed for itching.   fluticasone 50 MCG/ACT nasal spray Commonly known as:  FLONASE USE 2 SPRAYS DAILY IN BOTH NOSTRILS What changed:  See the new instructions.   gabapentin 100 MG capsule Commonly known as:  NEURONTIN Take 200 mg by mouth 2 (two) times daily.   hydrochlorothiazide 12.5 MG capsule Commonly known as:  MICROZIDE TAKE 1 CAPSULE (12.5 MG TOTAL) DAILY BY MOUTH.   HYDROcodone-acetaminophen 10-325 MG tablet Commonly known as:  NORCO Take 1 tablet  by mouth every 6 (six) hours as needed for moderate pain.   imiquimod 5 % cream Commonly known as:  ALDARA Apply topically 3 (three) times a week.   nortriptyline 25 MG capsule Commonly known as:  PAMELOR Take 25 mg by mouth at bedtime.   ondansetron 4 MG tablet Commonly known as:  ZOFRAN TAKE 1 TABLET (4 MG TOTAL) BY MOUTH EVERY 8 (EIGHT) HOURS AS NEEDED FOR NAUSEA OR VOMITING.   oxybutynin 5 MG tablet Commonly known as:  DITROPAN Take 5 mg by mouth at bedtime.   pantoprazole 40 MG tablet Commonly known as:  PROTONIX TAKE 1 TABLET (40 MG TOTAL) BY MOUTH DAILY. What changed:    how much to take  how to take this  when to take this  additional instructions   ranitidine 150 MG tablet Commonly known as:  ZANTAC TAKE 1 TABLET (150 MG TOTAL) 2 (TWO) TIMES DAILY BY MOUTH.   SPRIX 15.75 MG/SPRAY Soln Generic drug:  Ketorolac Tromethamine Place 1 spray into the nose every 6 (six) hours as needed (for pain).   tamsulosin 0.4 MG Caps capsule Commonly known as:  FLOMAX TAKE 1 CAPSULE (0.4 MG TOTAL) BY MOUTH twice daily   timolol 0.5 % ophthalmic solution Commonly known as:  TIMOPTIC Place 1 drop into both eyes  daily.   tiZANidine 4 MG capsule Commonly known as:  ZANAFLEX TAKE 1 CAPSULE BY ORAL ROUTE 3 TIMES EVERY DAY What changed:    how much to take  how to take this  when to take this  reasons to take this  additional instructions   tretinoin 0.1 % cream Commonly known as:  RETIN-A APPLY TO AFFECTED AREA AT BEDTIME TO FACE   zolpidem 10 MG tablet Commonly known as:  AMBIEN TAKE 1 TABLET AT BEDTIME AS NEEDED FOR SLEEP What changed:    reasons to take this  additional instructions      Follow-up Information    Biagio Borg, MD Follow up in 1 week(s).   Specialties:  Internal Medicine, Radiology Contact information: [address] [place] [address] [phone]        [facility] Follow up in 4 week(s).   Specialty:  Neurology Why:  stroke clinic. office will call with appt date and time. Contact information: [address] [place] [phone]           Time coordinating discharge: 25 min  Signed:  Geradine Girt DO  [facility] 10/27/2018, 5:13 PM

## 2018-10-28 ENCOUNTER — Encounter: Payer: Medicare Other | Admitting: Gastroenterology

## 2018-10-28 ENCOUNTER — Telehealth: Payer: Self-pay | Admitting: *Deleted

## 2018-10-28 NOTE — Telephone Encounter (Signed)
Transition Care Management Follow-up Telephone Call   Date discharged? 10/27/18   How have you been since you were released from the hospital? Pt states he is doing ok   Do you understand why you were in the hospital? YES   Do you understand the discharge instructions? YES   Where were you discharged to? Home   Items Reviewed:  Medications reviewed: YES  Allergies reviewed: YES  Dietary changes reviewed: YES  Referrals reviewed: YES, still waiting on appt w/neurology    Functional Questionnaire:   Activities of Daily Living (ADLs):   He states he are independent in the following: ambulation, bathing and hygiene, feeding, continence, grooming, toileting and dressing States he doesn't require assistance    Any transportation issues/concerns?: NO   Any patient concerns? NO   Confirmed importance and date/time of follow-up visits scheduled YES, appt 10/30/18  Provider Appointment booked with Dr, Jenny Reichmann   Confirmed with patient if condition begins to worsen call PCP or go to the ER.  Patient was given the office number and encouraged to call back with question or concerns.  : YES

## 2018-10-30 ENCOUNTER — Encounter: Payer: Self-pay | Admitting: Internal Medicine

## 2018-10-30 ENCOUNTER — Ambulatory Visit: Payer: Medicare Other | Admitting: Internal Medicine

## 2018-10-30 VITALS — BP 104/74 | HR 84 | Temp 98.2°F | Ht 73.0 in | Wt 166.0 lb

## 2018-10-30 DIAGNOSIS — I69354 Hemiplegia and hemiparesis following cerebral infarction affecting left non-dominant side: Secondary | ICD-10-CM | POA: Diagnosis not present

## 2018-10-30 DIAGNOSIS — E785 Hyperlipidemia, unspecified: Secondary | ICD-10-CM | POA: Diagnosis not present

## 2018-10-30 DIAGNOSIS — I639 Cerebral infarction, unspecified: Secondary | ICD-10-CM

## 2018-10-30 DIAGNOSIS — I10 Essential (primary) hypertension: Secondary | ICD-10-CM

## 2018-10-30 NOTE — Progress Notes (Signed)
Subjective:    Patient ID: Mark Dunlap, male    DOB: 05-19-1961, 58 y.o.   MRN: [id number]  HPI  58 y.o.malewith medical history significant ofsmoking, hypertension, hyperlipidemia not on treatment, crack cocaine abuse and prostatism who presents to the emergency room with episode of sudden weakness at night. According to the patient, they watched game last night until 10 PM, he took Ambien to go to bed, little bit later he did not get sleep so he took another Ambien to try to sleep. At about 2 AM in the morning, he thought he has to get ready to go to work so he went to the bathroom, he lost balance and fell. He managed to come back to the bedroom then again went back to shower where he could not maintain his balance. Still has some left sided weakness mild but did not need outpt PT and plans to start at the gym soon.  Stil taking the asa/plavix x 3 wks, then asa alone, plans to f/u with GI for reflux and EGD after off plavix.   Plans to quit smoking with patches soon.  Goinb back to work next wk.  Plans to really work on low chol diet as well.  Plans to f/u with neurology and urology soon for elevated psa not improved with doxy course. No new complaints Past Medical History:  Diagnosis Date  . Acute prostatitis 05/23/2009  . Adjustment disorder with depressed mood 09/2007  . CERVICAL RADICULOPATHY, LEFT 08/30/2008  . DJD (degenerative joint disease) of hip   . DJD (degenerative joint disease) of knee    left knee  . GERD (gastroesophageal reflux disease)   . 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 H/O: substance abuse ([place])    hx of ETOH/Crack cocaine-none since 11/08 per pt/Does not Drive due to this  . HYPERTENSION 08/30/2008  . HYPERTHYROIDISM 02/13/2010   pt was told by  Dr Jenny Reichmann  that thyroid was now back to normal .. 2012 ...   . HYPOTENSION 10/31/2009  . Legally blind in left eye, as defined in [place]    Blind Left eye, small amt  vision Right eye  . Other and unspecified hyperlipidemia 08/20/2013  . PONV (postoperative nausea and vomiting)   . Seasonal allergies   . SPINAL STENOSIS, CERVICAL 08/08/2009  . THYROID NODULE, RIGHT 03/02/2010   Past Surgical History:  Procedure Laterality Date  . ANTERIOR CERVICAL DECOMP/DISCECTOMY FUSION  10/11/2011   Procedure: ANTERIOR CERVICAL DECOMPRESSION/DISCECTOMY FUSION 3 LEVELS;  Surgeon: Eustace Moore;  Location: [place] [facility];  Service: Neurosurgery;  Laterality: N/A;  Cervical three-four ,cervical four five cervical five six Anterior Cervical Decompression Fusion with peek + plate Nuvasive translational plate Orthofix peek (2 1/2 hours) Rm # [id number]  . ANTERIOR CERVICAL DECOMP/DISCECTOMY FUSION N/A 07/29/2014   Procedure: ANTERIOR CERVICAL DECOMPRESSION/DISCECTOMY FUSION 1 LEVEL/HARDWARE REMOVAL;  Surgeon: Eustace Moore, MD;  Location: [place] [facility];  Service: Neurosurgery;  Laterality: N/A;  cervical six-seven  . POSTERIOR CERVICAL FUSION/FORAMINOTOMY N/A 09/23/2013   Procedure: CERVICALTWO TO CERVICAL SEVEN POSTERIOR CERVICAL FUSION/FORAMINOTOMY WITH LATERAL MASS FIXATION;  Surgeon: Eustace Moore, MD;  Location: [place] [facility];  Service: Neurosurgery;  Laterality: N/A;  . Right Hand I&D  12/07   s/p rdue to abscess-Dr. Amedeo Plenty  . TOTAL HIP ARTHROPLASTY  12/17/2011   Procedure: TOTAL HIP ARTHROPLASTY;  Surgeon: Johnny Bridge, MD;  Location: [facility];  Service: Orthopedics;  Laterality: Left;  reports that he has been smoking cigarettes. He has a 20.00 pack-year smoking history. He has never used smokeless tobacco. He reports current drug use. Drug: "Crack" cocaine. He reports that he does not drink alcohol. family history includes Alcohol abuse in his brother, cousin, and father; Arthritis in his mother; Diabetes in his brother and sister; Goiter in his mother and sister; Heart disease in his father and another family member; Hyperlipidemia in his mother; Hypertension in his sister;  Stroke in his father. Allergies  Allergen Reactions  . Hydrocodone Itching and Other (See Comments)    Pt can tolerate  . Tramadol Itching  . Morphine And Related Itching  . Oxycodone Itching   Current Outpatient Medications on File Prior to Visit  Medication Sig Dispense Refill  . acetaminophen (TYLENOL) 500 MG tablet Take 1,000 mg by mouth every 6 (six) hours as needed for moderate pain or headache.    [name] adapalene (DIFFERIN) 0.1 % gel APPLY 1 APPLICATION TOPICALLY AT BEDTIME. 45 g 1  . AMBULATORY NON FORMULARY MEDICATION Medication Name: GI Cocktail 90 ml-Viscous lidocaine 90 ml-Dicyclomine 10 mg/38ml 270 ml  Maalox Total ml's 450 ml Take 5-10 ml's and swallow as needed for breakthroughl. 450 mL 1  . aspirin 81 MG chewable tablet Chew 1 tablet (81 mg total) by mouth daily.    [name] atorvastatin (LIPITOR) 40 MG tablet Take 1 tablet (40 mg total) by mouth daily at 6 PM. 30 tablet 0  . benzoyl peroxide (BENZOYL PEROXIDE) 5 % external liquid Apply 1 application topically 2 (two) times daily. 226 g 0  . bimatoprost (LUMIGAN) 0.01 % SOLN Place 1 drop into both eyes at bedtime.    . carboxymethylcellulose (REFRESH PLUS) 0.5 % SOLN Place 1 drop into both eyes 3 (three) times daily as needed (for clear eyes).     . clopidogrel (PLAVIX) 75 MG tablet Take 1 tablet (75 mg total) by mouth daily. 21 tablet 0  . diphenhydrAMINE (BENADRYL) 25 mg capsule Take 50 mg by mouth every 6 (six) hours as needed for itching.     . fluticasone (FLONASE) 50 MCG/ACT nasal spray USE 2 SPRAYS DAILY IN BOTH NOSTRILS (Patient taking differently: Place 2 sprays into both nostrils daily. ) 16 g 1  . gabapentin (NEURONTIN) 100 MG capsule Take 200 mg by mouth 2 (two) times daily.  2  . hydrochlorothiazide (MICROZIDE) 12.5 MG capsule TAKE 1 CAPSULE (12.5 MG TOTAL) DAILY BY MOUTH. 90 capsule 1  . HYDROcodone-acetaminophen (NORCO) 10-325 MG per tablet Take 1 tablet by mouth every 6 (six) hours as needed for moderate pain. 60  tablet 0  . imiquimod (ALDARA) 5 % cream Apply topically 3 (three) times a week. 12 each 0  . Ketorolac Tromethamine (SPRIX) 15.75 MG/SPRAY SOLN Place 1 spray into the nose every 6 (six) hours as needed (for pain).    . nortriptyline (PAMELOR) 25 MG capsule Take 25 mg by mouth at bedtime.  0  . ondansetron (ZOFRAN) 4 MG tablet TAKE 1 TABLET (4 MG TOTAL) BY MOUTH EVERY 8 (EIGHT) HOURS AS NEEDED FOR NAUSEA OR VOMITING. 20 tablet 2  . oxybutynin (DITROPAN) 5 MG tablet Take 5 mg by mouth at bedtime.  11  . pantoprazole (PROTONIX) 40 MG tablet TAKE 1 TABLET (40 MG TOTAL) BY MOUTH DAILY. (Patient taking differently: Take 40 mg by mouth daily. ) 90 tablet 3  . ranitidine (ZANTAC) 150 MG tablet TAKE 1 TABLET (150 MG TOTAL) 2 (TWO) TIMES DAILY BY MOUTH. 180 tablet 3  .  timolol (TIMOPTIC) 0.5 % ophthalmic solution Place 1 drop into both eyes daily.  6  . tiZANidine (ZANAFLEX) 4 MG capsule TAKE 1 CAPSULE BY ORAL ROUTE 3 TIMES EVERY DAY (Patient taking differently: Take 4 mg by mouth 3 (three) times daily as needed for muscle spasms. ) 60 capsule 2  . tretinoin (RETIN-A) 0.1 % cream APPLY TO AFFECTED AREA AT BEDTIME TO FACE 45 g 2  . zolpidem (AMBIEN) 10 MG tablet TAKE 1 TABLET AT BEDTIME AS NEEDED FOR SLEEP (Patient taking differently: Take 10 mg by mouth at bedtime as needed for sleep. ) 90 tablet 1   No current facility-administered medications on file prior to visit.    Review of Systems  Constitutional: Negative for other unusual diaphoresis or sweats HENT: Negative for ear discharge or swelling Eyes: Negative for other worsening visual disturbances Respiratory: Negative for stridor or other swelling  Gastrointestinal: Negative for worsening distension or other blood Genitourinary: Negative for retention or other urinary change Musculoskeletal: Negative for other MSK pain or swelling Skin: Negative for color change or other new lesions Neurological: Negative for worsening tremors and other numbness    Psychiatric/Behavioral: Negative for worsening agitation or other fatigue All other system neg per pt    Objective:   Physical Exam BP 104/74   Pulse 84   Temp 98.2 F (36.8 C) (Oral)   Ht 6\' 1"  (1.854 m)   Wt 166 lb (75.3 kg)   SpO2 98%   BMI 21.90 kg/m  VS noted,  Constitutional: Pt appears in NAD HENT: Head: NCAT.  Right Ear: External ear normal.  Left Ear: External ear normal.  Eyes: . Pupils are equal, round, and reactive to light. Conjunctivae and EOM are normal Nose: without d/c or deformity Neck: Neck supple. Gross normal ROM Cardiovascular: Normal rate and regular rhythm.   Pulmonary/Chest: Effort normal and breath sounds without rales or wheezing.  Abd:  Soft, NT, ND, + BS, no organomegaly Neurological: Pt is alert. At baseline orientation, motor with LUE and LLE 4+/5 strength Skin: Skin is warm. No rashes, other new lesions, no LE edema Psychiatric: Pt behavior is normal without agitation  No other exam findings  Lab Results  Component Value Date   WBC 10.6 (H) 10/26/2018   HGB 15.3 10/26/2018   HCT 45.0 10/26/2018   PLT 327 10/26/2018   GLUCOSE 101 (H) 10/26/2018   CHOL 194 10/27/2018   TRIG 217 (H) 10/27/2018   HDL 47 10/27/2018   LDLDIRECT 100.0 09/02/2018   LDLCALC 104 (H) 10/27/2018   ALT 27 10/26/2018   AST 24 10/26/2018   NA 138 10/26/2018   K 3.4 (L) 10/26/2018   CL 103 10/26/2018   CREATININE 1.10 10/26/2018   BUN 14 10/26/2018   CO2 24 10/26/2018   TSH 2.35 09/02/2018   PSA 6.11 (H) 10/20/2018   INR 0.94 10/26/2018   HGBA1C 5.1 10/27/2018       Assessment & Plan:

## 2018-10-30 NOTE — Patient Instructions (Addendum)
Please continue all other medications as before, and refills have been done if requested.  Please have the pharmacy call with any other refills you may need.  Please continue your efforts at being more active, low cholesterol diet, and weight control.  Please keep your appointments with your specialists as you may have planned  Please return in 6 months, or sooner if needed 

## 2018-10-31 ENCOUNTER — Other Ambulatory Visit: Payer: Self-pay | Admitting: Internal Medicine

## 2018-11-01 ENCOUNTER — Encounter: Payer: Self-pay | Admitting: Internal Medicine

## 2018-11-01 NOTE — Assessment & Plan Note (Signed)
stable overall by history and exam, recent data reviewed with pt, and pt to continue medical treatment as before,  to f/u any worsening symptoms or concerns  

## 2018-11-01 NOTE — Assessment & Plan Note (Addendum)
Stable, cont same tx,  to f/u any worsening symptoms or concerns cont asa 81 and statin

## 2018-11-02 ENCOUNTER — Ambulatory Visit: Payer: Medicare Other | Admitting: Diagnostic Neuroimaging

## 2018-11-03 ENCOUNTER — Other Ambulatory Visit: Payer: Self-pay

## 2018-11-03 NOTE — Patient Outreach (Signed)
[facility]) Care Management  11/03/2018  Mark Dunlap March 26, 1961 [id number]   EMMI- stroke RED ON EMMI ALERT Day # 6 Date: 11/03/2018 Red Alert Reason:  Smoked or been around smoke.  Outreach attempt: Spoke with patient.  He reports he is trying to quit.  He states he is on the last ones of his cigarettes.    He states he has his patches and waiting to quit his cigarettes.  Patient states that he is doing what he is supposed to do.  Encouraged patient to continue to try to quit smoking. He verbalized understanding.  Patient voices no concerns.     Plan: RN CM will close case.    Jone Baseman, RN, MSN [facility] Care Management Care Management Coordinator Direct Line [phone] Toll Free: [phone]  Fax: [phone]

## 2018-11-06 ENCOUNTER — Other Ambulatory Visit: Payer: Self-pay | Admitting: Internal Medicine

## 2018-11-06 NOTE — Telephone Encounter (Signed)
Copied from [place] [id number]. Topic: Quick Communication - Rx Refill/Question >> Nov 06, 2018  3:46 PM Percell Belt A wrote: Medication: imiquimod (ALDARA) 5 % cream [[id number]  Has the patient contacted their pharmacy?  No  (Agent: If no, request that the patient contact the pharmacy for the refill.) (Agent: If yes, when and what did the pharmacy advise?)  Preferred Pharmacy (with phone number or street name):   Agent: Please be advised that RX refills may take up to 3 business days. We ask that you follow-up with your pharmacy.

## 2018-11-06 NOTE — Telephone Encounter (Signed)
Requested medication (s) are due for refill today: Yes  Requested medication (s) are on the active medication list: Yes  Last refill:  04/24/18  Future visit scheduled: Yes  Notes to clinic:  Unable to refill per protocol.     Requested Prescriptions  Pending Prescriptions Disp Refills   imiquimod (ALDARA) 5 % cream 12 each 0    Sig: Apply topically 3 (three) times a week.     Off-Protocol Failed - 11/06/2018  4:02 PM      Failed - Medication not assigned to a protocol, review manually.      Passed - Valid encounter within last 12 months    Recent Outpatient Visits          1 week ago Cerebrovascular accident (CVA), unspecified mechanism ([place])   [place] John, James W, MD   2 weeks ago Gastroesophageal reflux disease, esophagitis presence not specified   [place] John, James W, MD   2 months ago Preventative health care   [facility] -Georges Mouse, MD   6 months ago Perianal wart   [place] John, James W, MD   8 months ago Gastroesophageal reflux disease, esophagitis presence not specified   [place], James W, MD      Future Appointments            In 1 month Venancio Poisson, NP [facility]   In 3 months  [place], [place]   In 5 months Jenny Reichmann, Hunt Oris, MD [place], [facility]

## 2018-11-09 MED ORDER — IMIQUIMOD 5 % EX CREA: TOPICAL_CREAM | CUTANEOUS | 0 refills | Status: DC

## 2018-11-15 ENCOUNTER — Other Ambulatory Visit: Payer: Self-pay | Admitting: Internal Medicine

## 2018-11-18 ENCOUNTER — Other Ambulatory Visit: Payer: Self-pay | Admitting: Internal Medicine

## 2018-11-18 MED ORDER — CLOPIDOGREL BISULFATE 75 MG PO TABS: 75.0000 mg | ORAL_TABLET | Freq: Every day | ORAL | 0 refills | Status: DC

## 2018-11-18 NOTE — Telephone Encounter (Signed)
Copied from [place] [phone] [phone]. Topic: Quick Communication - Rx Refill/Question >> Nov 18, 2018 10:03 AM Reyne Dumas L wrote: Medication: clopidogrel (PLAVIX) 75 MG tablet  Has the patient contacted their pharmacy? Yes - states no refills, pt is completely out of medication (Agent: If no, request that the patient contact the pharmacy for the refill.) (Agent: If yes, when and what did the pharmacy advise?)  Preferred Pharmacy (with phone number or street name):  [id number] [address], [place]. [phone] (Phone) [phone] (Fax)  Agent: Please be advised that RX refills may take up to 3 business days. We ask that you follow-up with your pharmacy.

## 2018-11-18 NOTE — Telephone Encounter (Signed)
Requested medication (s) are due for refill today -yes  Requested medication (s) are on the active medication list -yes  Future visit scheduled -yes  Last refill: 10/28/18  Notes to clinic: Patient is requesting refill of medication that passes protocol- need to have PCP name on Rx- outside provider.  Requested Prescriptions  Pending Prescriptions Disp Refills   clopidogrel (PLAVIX) 75 MG tablet 21 tablet 0    Sig: Take 1 tablet (75 mg total) by mouth daily.     Hematology: Antiplatelets - clopidogrel Failed - 11/18/2018 10:08 AM      Failed - Evaluate AST, ALT within 2 months of therapy initiation.      Passed - ALT in normal range and within 360 days    ALT  Date Value Ref Range Status  10/26/2018 27 0 - 44 U/L Final         Passed - AST in normal range and within 360 days    AST  Date Value Ref Range Status  10/26/2018 24 15 - 41 U/L Final         Passed - HCT in normal range and within 180 days    HCT  Date Value Ref Range Status  10/26/2018 45.0 39.0 - 52.0 % Final         Passed - HGB in normal range and within 180 days    Hemoglobin  Date Value Ref Range Status  10/26/2018 15.3 13.0 - 17.0 g/dL Final         Passed - PLT in normal range and within 180 days    Platelets  Date Value Ref Range Status  10/26/2018 327 150 - 400 K/uL Final         Passed - Valid encounter within last 6 months    Recent Outpatient Visits          2 weeks ago Cerebrovascular accident (CVA), unspecified mechanism ([place])   [place] [facility] -Georges Mouse, MD   4 weeks ago Gastroesophageal reflux disease, esophagitis presence not specified   [place] John, James W, MD   2 months ago Preventative health care   [facility] -Georges Mouse, MD   6 months ago Perianal wart   [place] John, James W, MD   9 months ago Gastroesophageal reflux disease, esophagitis presence not specified   [place], James W, MD      Future Appointments            In 2 weeks Venancio Poisson, NP [facility]   In 2 months  [place], [place]   In 5 months Biagio Borg, MD [place], [facility]            Requested Prescriptions  Pending Prescriptions Disp Refills   clopidogrel (PLAVIX) 75 MG tablet 21 tablet 0    Sig: Take 1 tablet (75 mg total) by mouth daily.     Hematology: Antiplatelets - clopidogrel Failed - 11/18/2018 10:08 AM      Failed - Evaluate AST, ALT within 2 months of therapy initiation.      Passed - ALT in normal range and within 360 days    ALT  Date Value Ref Range Status  10/26/2018 27 0 - 44 U/L Final         Passed - AST in normal range and within 360 days  AST  Date Value Ref Range Status  10/26/2018 24 15 - 41 U/L Final         Passed - HCT in normal range and within 180 days    HCT  Date Value Ref Range Status  10/26/2018 45.0 39.0 - 52.0 % Final         Passed - HGB in normal range and within 180 days    Hemoglobin  Date Value Ref Range Status  10/26/2018 15.3 13.0 - 17.0 g/dL Final         Passed - PLT in normal range and within 180 days    Platelets  Date Value Ref Range Status  10/26/2018 327 150 - 400 K/uL Final         Passed - Valid encounter within last 6 months    Recent Outpatient Visits          2 weeks ago Cerebrovascular accident (CVA), unspecified mechanism ([place])   [place] [facility] -Georges Mouse, MD   4 weeks ago Gastroesophageal reflux disease, esophagitis presence not specified   [place], James W, MD   2 months ago Preventative health care   [facility] -Georges Mouse, MD   6 months ago Perianal wart   [place] John, James W, MD   9 months ago Gastroesophageal reflux disease, esophagitis presence not  specified   [place], James W, MD      Future Appointments            In 2 weeks Venancio Poisson, NP [facility]   In 2 months  [place], [place]   In 5 months Jenny Reichmann, Hunt Oris, MD [place], [facility]

## 2018-11-20 ENCOUNTER — Other Ambulatory Visit: Payer: Self-pay | Admitting: Internal Medicine

## 2018-11-23 ENCOUNTER — Telehealth: Payer: Self-pay

## 2018-11-23 ENCOUNTER — Telehealth: Payer: Self-pay | Admitting: Physician Assistant

## 2018-11-23 MED ORDER — ATORVASTATIN CALCIUM 40 MG PO TABS: 40.0000 mg | ORAL_TABLET | Freq: Every day | ORAL | 1 refills | Status: DC

## 2018-11-23 NOTE — Telephone Encounter (Signed)
Pt is requesting prescription for Gi cocktail sent to [facility] on [address].

## 2018-11-23 NOTE — Telephone Encounter (Signed)
Refill sent.

## 2018-11-24 ENCOUNTER — Other Ambulatory Visit: Payer: Self-pay

## 2018-11-24 ENCOUNTER — Other Ambulatory Visit: Payer: Self-pay | Admitting: Physician Assistant

## 2018-11-24 MED ORDER — AMBULATORY NON FORMULARY MEDICATION: 1 refills | Status: DC

## 2018-11-30 ENCOUNTER — Telehealth: Payer: Self-pay | Admitting: Internal Medicine

## 2018-11-30 MED ORDER — IMIQUIMOD 5 % EX CREA: TOPICAL_CREAM | CUTANEOUS | 1 refills | Status: DC

## 2018-11-30 NOTE — Telephone Encounter (Signed)
Pt needs a refill of his imiquimod cream, please sent to [facility] on [address].

## 2018-12-07 ENCOUNTER — Ambulatory Visit: Payer: Medicare Other | Admitting: Adult Health

## 2018-12-07 VITALS — BP 136/89 | HR 81 | Ht 73.5 in | Wt 169.6 lb

## 2018-12-07 DIAGNOSIS — E785 Hyperlipidemia, unspecified: Secondary | ICD-10-CM

## 2018-12-07 DIAGNOSIS — I639 Cerebral infarction, unspecified: Secondary | ICD-10-CM | POA: Diagnosis not present

## 2018-12-07 DIAGNOSIS — I10 Essential (primary) hypertension: Secondary | ICD-10-CM

## 2018-12-07 DIAGNOSIS — Z720 Tobacco use: Secondary | ICD-10-CM | POA: Diagnosis not present

## 2018-12-07 NOTE — Patient Instructions (Signed)
Continue clopidogrel 75 mg daily  and Lipitor for secondary stroke prevention  You can stop use of aspirin 81mg  at this time and continue clopidogrel (Plavix) only   Continue to follow up with PCP regarding cholesterol and blood pressure management   Continue to follow with GI provider in regards to your GERD  Stress relaxation techniques are important  Continue to decrease cigarette use....great job so far!! Continue to decrease amt until you completely quit with assistance of patches    Continue healthy diet to prevent GERD symptoms along with improvement of your blood pressure and cholesterol   Continue to monitor blood pressure at home  Maintain strict control of hypertension with blood pressure goal below 130/90, diabetes with hemoglobin A1c goal below 6.5% and cholesterol with LDL cholesterol (bad cholesterol) goal below 70 mg/dL. I also advised the patient to eat a healthy diet with plenty of whole grains, cereals, fruits and vegetables, exercise regularly and maintain ideal body weight.  Followup in the future with me in 4 months or call earlier if needed       Thank you for coming to see [place] at [facility]. I hope we have been able to provide you high quality care today.  You may receive a patient satisfaction survey over the next few weeks. We would appreciate your feedback and comments so that we may continue to improve ourselves and the health of our patients.

## 2018-12-07 NOTE — Progress Notes (Signed)
[facility] [address] [place]. [place] [address] [phone]       OFFICE FOLLOW UP NOTE  Mark Dunlap Date of Birth:  1961-05-19 Medical Record Number:  [id number]   Reason for Referral:  hospital stroke follow up  CHIEF COMPLAINT:  Chief Complaint  Patient presents with  . Hospitalization Follow-up    Rm hall, alone  . 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 History obtained from patient and chart review. Reviewed all radiology images and labs personally.  Mark Dunlap is a 58 y.o. 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  CT head reviewed and was negative for acute infarct but did show right greater than left old BG lacunes and small vessel disease.  CTA head and neck negative for emergent LVO.  MRI head reviewed and showed right BG/CR infarct with moderate small vessel disease.  2D echo normal.  Recommended DAPT for 3 weeks then Plavix alone as he was on aspirin PTA.  HTN stable and recommended long-term BP goal normotensive range.  LDL 104 and initiated atorvastatin 40 mg daily.  Current tobacco use with smoking cessation counseling provided.  All symptoms resolved prior to discharge therefore discharged home in stable condition.  He is being seen today for hospital discharge follow-up and overall has been stable from a stroke standpoint.  He denies residual deficits or recurring of symptoms.  He has returned back to all prior activities including driving and working without difficulties.  He continues on both aspirin and plavix without bleeding or bruising.  Continues on atorvastatin without side effects myalgias.  Blood pressure today satisfactory at 136/89.  He does endorse improving his diet and eating healthier.  Tobacco use with approx 10 cig per day with prior use over 1 pack per day. He does have nicotine patches and is aware of need of quitting.  Denies new or worsening stroke/TIA symptoms.   ROS:   14 system review of systems performed and negative with exception of see HPI  PMH:  Past Medical History:  Diagnosis Date  . Acute prostatitis 05/23/2009  . Adjustment disorder with depressed mood 09/2007  . CERVICAL RADICULOPATHY, LEFT 08/30/2008  . DJD (degenerative joint disease) of hip   . DJD (degenerative joint disease) of knee    left knee  . GERD (gastroesophageal reflux disease)   . 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 H/O: substance abuse ([place])    hx of ETOH/Crack cocaine-none since 11/08 per pt/Does not Drive due to this  . HYPERTENSION 08/30/2008  . HYPERTHYROIDISM 02/13/2010   pt was told by  Dr Jenny Reichmann  that thyroid was now back to normal .. 2012 ...   . HYPOTENSION 10/31/2009  . 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 Other and unspecified hyperlipidemia 08/20/2013  . PONV (postoperative nausea and vomiting)   . Seasonal allergies   . SPINAL STENOSIS, CERVICAL 08/08/2009  . THYROID NODULE, RIGHT 03/02/2010    PSH:  Past Surgical History:  Procedure Laterality Date  . ANTERIOR CERVICAL DECOMP/DISCECTOMY FUSION  10/11/2011   Procedure: ANTERIOR CERVICAL DECOMPRESSION/DISCECTOMY FUSION 3 LEVELS;  Surgeon:  Eustace Moore;  Location: [place] [facility];  Service: Neurosurgery;  Laterality: N/A;  Cervical three-four ,cervical four five cervical five six Anterior Cervical Decompression Fusion with peek + plate Nuvasive translational plate Orthofix peek (2 1/2 hours) Rm # [id number]  . ANTERIOR CERVICAL DECOMP/DISCECTOMY FUSION N/A 07/29/2014   Procedure: ANTERIOR CERVICAL DECOMPRESSION/DISCECTOMY FUSION 1 LEVEL/HARDWARE REMOVAL;  Surgeon: Eustace Moore, MD;  Location: [place] [facility];  Service:  Neurosurgery;  Laterality: N/A;  cervical six-seven  . POSTERIOR CERVICAL FUSION/FORAMINOTOMY N/A 09/23/2013   Procedure: CERVICALTWO TO CERVICAL SEVEN POSTERIOR CERVICAL FUSION/FORAMINOTOMY WITH LATERAL MASS FIXATION;  Surgeon: Eustace Moore, MD;  Location: [place] [facility];  Service: Neurosurgery;  Laterality: N/A;  . Right Hand I&D  12/07   s/p rdue to abscess-Dr. Amedeo Plenty  . TOTAL HIP ARTHROPLASTY  12/17/2011   Procedure: TOTAL HIP ARTHROPLASTY;  Surgeon: Johnny Bridge, MD;  Location: [place];  Service: Orthopedics;  Laterality: Left;    Social History:  Social History   Socioeconomic History  . Marital status: Single    Spouse name: Not on file  . Number of children: 0  . Years of education: Not on file  . Highest education level: Not on file  Occupational History  . Occupation: Industries for the [employer]  . Financial resource strain: Not hard at all  . Food insecurity:    Worry: Never true    Inability: Never true  . Transportation needs:    Medical: No    Non-medical: No  Tobacco Use  . Smoking status: Current Every Day Smoker    Packs/day: 0.50    Years: 40.00    Pack years: 20.00    Types: Cigarettes  . Smokeless tobacco: Never Used  . Tobacco comment: last crack over 1 yr   alcohol last time few yrs. States today is his quit date for smoking   Substance and Sexual Activity  . Alcohol use: No    Alcohol/week: 0.0 standard drinks    Comment: hx of alcohol abuse  . Drug use: Yes    Types: "Crack" cocaine    Comment: history of, " clean for 24 MONTHS"  . Sexual activity: Not Currently  Lifestyle  . Physical activity:    Days per week: 0 days    Minutes per session: 0 min  . Stress: Rather much  Relationships  . Social connections:    Talks on phone: More than three times a week    Gets together: More than three times a week    Attends religious service: More than 4 times per year    Active member of club or organization: Yes    Attends meetings of  clubs or organizations: More than 4 times per year    Relationship status: Never married  . Intimate partner violence:    Fear of current or ex partner: Not on file    Emotionally abused: Not on file    Physically abused: Not on file    Forced sexual activity: Not on file  Other Topics Concern  . Not on file  Social History Narrative  . Not on file    Family History:  Family History  Problem Relation Age of Onset  . Alcohol abuse Father   . Stroke Father   . Heart disease Father   . Arthritis Mother   . Hyperlipidemia Mother   . Goiter Mother        resection of benign goiter  . Hypertension Sister   .  Diabetes Sister   . Goiter Sister        resection of benign goiter  . Diabetes Brother   . Alcohol abuse Brother   . Alcohol abuse Cousin   . Heart disease Other        Aunt  . Colon cancer Neg Hx     Medications:   Current Outpatient Medications on File Prior to Visit  Medication Sig Dispense Refill  . acetaminophen (TYLENOL) 500 MG tablet Take 1,000 mg by mouth every 6 (six) hours as needed for moderate pain or headache.    [name] adapalene (DIFFERIN) 0.1 % gel APPLY 1 APPLICATION TOPICALLY AT BEDTIME. 45 g 1  . AMBULATORY NON FORMULARY MEDICATION Medication Name: GI Cocktail 90 ml-Viscous lidocaine 90 ml-Dicyclomine 10 mg/30ml 270 ml  Maalox Total ml's 450 ml Take 5-10 ml's and swallow as needed for breakthroughl. 450 mL 1  . atorvastatin (LIPITOR) 40 MG tablet Take 1 tablet (40 mg total) by mouth daily at 6 PM. 90 tablet 1  . benzoyl peroxide (BENZOYL PEROXIDE) 5 % external liquid Apply 1 application topically 2 (two) times daily. 226 g 0  . bimatoprost (LUMIGAN) 0.01 % SOLN Place 1 drop into both eyes at bedtime.    . carboxymethylcellulose (REFRESH PLUS) 0.5 % SOLN Place 1 drop into both eyes 3 (three) times daily as needed (for clear eyes).     . clopidogrel (PLAVIX) 75 MG tablet TAKE 1 TABLET BY MOUTH EVERY DAY 90 tablet 1  . diphenhydrAMINE (BENADRYL) 25 mg  capsule Take 50 mg by mouth every 6 (six) hours as needed for itching.     . fluticasone (FLONASE) 50 MCG/ACT nasal spray USE 2 SPRAYS DAILY IN BOTH NOSTRILS (Patient taking differently: Place 2 sprays into both nostrils daily. ) 16 g 1  . gabapentin (NEURONTIN) 100 MG capsule Take 200 mg by mouth 2 (two) times daily.  2  . hydrochlorothiazide (MICROZIDE) 12.5 MG capsule TAKE 1 CAPSULE (12.5 MG TOTAL) DAILY BY MOUTH. 90 capsule 1  . HYDROcodone-acetaminophen (NORCO) 10-325 MG per tablet Take 1 tablet by mouth every 6 (six) hours as needed for moderate pain. 60 tablet 0  . imiquimod (ALDARA) 5 % cream Apply topically 3 (three) times a week. 12 each 1  . Ketorolac Tromethamine (SPRIX) 15.75 MG/SPRAY SOLN Place 1 spray into the nose every 6 (six) hours as needed (for pain).    . nortriptyline (PAMELOR) 25 MG capsule Take 25 mg by mouth at bedtime.  0  . ondansetron (ZOFRAN) 4 MG tablet TAKE 1 TABLET (4 MG TOTAL) BY MOUTH EVERY 8 (EIGHT) HOURS AS NEEDED FOR NAUSEA OR VOMITING. 20 tablet 2  . oxybutynin (DITROPAN) 5 MG tablet Take 5 mg by mouth at bedtime.  11  . pantoprazole (PROTONIX) 40 MG tablet TAKE 1 TABLET (40 MG TOTAL) BY MOUTH DAILY. (Patient taking differently: Take 40 mg by mouth daily. ) 90 tablet 3  . ranitidine (ZANTAC) 150 MG tablet TAKE 1 TABLET (150 MG TOTAL) 2 (TWO) TIMES DAILY BY MOUTH. 180 tablet 3  . tamsulosin (FLOMAX) 0.4 MG CAPS capsule TAKE 1 CAPSULE (0.4 MG TOTAL) BY MOUTH TWICE DAILY 180 capsule 1  . timolol (TIMOPTIC) 0.5 % ophthalmic solution Place 1 drop into both eyes daily.  6  . tiZANidine (ZANAFLEX) 4 MG capsule TAKE 1 CAPSULE BY ORAL ROUTE 3 TIMES EVERY DAY (Patient taking differently: Take 4 mg by mouth 3 (three) times daily as needed for muscle spasms. ) 60 capsule 2  .  tretinoin (RETIN-A) 0.1 % cream APPLY TO AFFECTED AREA AT BEDTIME TO FACE 45 g 2  . zolpidem (AMBIEN) 10 MG tablet TAKE 1 TABLET AT BEDTIME AS NEEDED FOR SLEEP (Patient taking differently: Take 10 mg by  mouth at bedtime as needed for sleep. ) 90 tablet 1   No current facility-administered medications on file prior to visit.     Allergies:   Allergies  Allergen Reactions  . Hydrocodone Itching and Other (See Comments)    Pt can tolerate  . Tramadol Itching  . Morphine And Related Itching  . Oxycodone Itching     Physical Exam  Vitals:   12/07/18 1322  BP: 136/89  Pulse: 81  Weight: 169 lb 9.6 oz (76.9 kg)  Height: 6' 1.5" (1.867 m)   Body mass index is 22.07 kg/m.  Visual Acuity Screening   Right eye Left eye Both eyes  Without correction: 20/200 0   With correction:       Depression screen [facility] 2/9 12/08/2018  Decreased Interest 0  Down, Depressed, Hopeless 0  PHQ - 2 Score 0  Altered sleeping -  Tired, decreased energy -  Change in appetite -  Feeling bad or failure about yourself  -  Trouble concentrating -  Moving slowly or fidgety/restless -  Suicidal thoughts -  PHQ-9 Score -  Difficult doing work/chores -     General: well developed, well nourished, pleasant middle-aged African-American male, seated, in no evident distress Head: head normocephalic and atraumatic.   Neck: supple with no carotid or supraclavicular bruits Cardiovascular: regular rate and rhythm, no murmurs Musculoskeletal: no deformity Skin:  no rash/petichiae Vascular:  Normal pulses all extremities  Neurologic Exam Mental Status: Awake and fully alert. Oriented to place and time. Recent and remote memory intact. Attention span, concentration and fund of knowledge appropriate. Mood and affect appropriate.  Cranial Nerves: Fundoscopic exam reveals sharp disc margins. Pupils equal, briskly reactive to light. Extraocular movements full without nystagmus. Visual fields full to confrontation. Hearing intact. Facial sensation intact. Face, tongue, palate moves normally and symmetrically.  Motor: Normal bulk and tone. Normal strength in all tested extremity muscles. Sensory.: intact to touch ,  pinprick , position and vibratory sensation.  Coordination: Rapid alternating movements normal in all extremities. Finger-to-nose and heel-to-shin performed accurately bilaterally. Gait and Station: Arises from chair without difficulty. Stance is normal. Gait demonstrates normal stride length and balance. Able to heel, toe and tandem walk without difficulty.  Reflexes: 1+ and symmetric. Toes downgoing.    NIHSS  0 Modified Rankin  0   Diagnostic Data (Labs, Imaging, Testing)  CT HEAD WO CONTRAST 10/26/2018 IMPRESSION: 1. No large acute stroke, hemorrhage, or mass effect. 2. ASPECTS is 10 3. Multiple chronic lacunar infarcts in the right greater than left basal ganglia and age advanced chronic microvascular ischemic changes of the brain. A small acute infarct may be obscured given advanced chronic changes. No prior study for comparison.  CT ANGIO HEAD W OR WO CONTRAST CT ANGIO NECK W OR WO CONTRAST CT CEREBRAL PERFUSION W CONTRAST 10/26/2018 IMPRESSION: 1. No emergent large vessel occlusion. No infarct or ischemia by CT perfusion. 2. Cervical and intracranial atherosclerosis without proximal flow limiting stenosis. 3. Suboptimal arterial bolus density.  MR BRAIN WO CONTRAST 10/26/2018 IMPRESSION: Acute infarction affecting the right basal ganglia corpus striatum. Minimal petechial blood products but no frank hematoma. No mass effect. Moderate pre-existing small-vessel ischemic changes elsewhere throughout the brain.  ECHOCARDIOGRAM 10/26/2018 Study Conclusions - Left ventricle: The  cavity size was normal. Wall thickness was   increased in a pattern of mild LVH. Systolic function was normal.   The estimated ejection fraction was in the range of 60% to 65%.   Wall motion was normal; there were no regional wall motion   abnormalities. Left ventricular diastolic function parameters   were normal. - Left atrium: The atrium was normal in size. - Inferior vena cava: The vessel  was normal in size. The   respirophasic diameter changes were in the normal range (>= 50%),   consistent with normal central venous pressure.    ASSESSMENT: Mark Dunlap is a 58 y.o. year old male here with right BG/CR infarct on 10/26/2018 secondary to small vessel disease. Vascular risk factors include HTN, HLD, tobacco use and prior substance abuse.  He has been seen today for follow-up visit and has been stable from a stroke standpoint without residual deficits or recurring of symptoms.    PLAN:  1. Right BG/CR infarct: Continue clopidogrel 75 mg daily  and atorvastatin 40 mg for secondary stroke prevention.  Advised to discontinue aspirin at this time as 3-week DAPT completed.  Maintain strict control of hypertension with blood pressure goal below 130/90, diabetes with hemoglobin A1c goal below 6.5% and cholesterol with LDL cholesterol (bad cholesterol) goal below 70 mg/dL.  I also advised the patient to eat a healthy diet with plenty of whole grains, cereals, fruits and vegetables, exercise regularly with at least 30 minutes of continuous activity daily and maintain ideal body weight. 2. HTN: Advised to continue current treatment regimen.  Today's BP 136/89.  Advised to continue to monitor at home along with continued follow-up with PCP for management 3. HLD: Advised to continue current treatment regimen along with continued follow-up with PCP for future prescribing and monitoring of lipid panel 4. Tobacco use: Highly encouraged complete cessation from tobacco use 5. He was previously being evaluated by GI for ongoing GERD symptoms with need of EGD.  Did advise him that it is typically recommended to hold off on any elective procedure that requires discontinuation of blood thinners for at least 6 months post stroke.  Advised him to continue to follow with GI for ongoing symptom management during the interval time    Follow up in 4 months for routine follow-up and evaluation of EGD  clearance or call earlier if needed   Greater than 50% of time during this 25 minute visit was spent on counseling, explanation of diagnosis of BG/CR infarct, reviewing risk factor management of HTN, HLD and tobacco use, planning of further management along with potential future management, and discussion with patient and family answering all questions.    Venancio Poisson, AGNP-BC  [facility] [address] [place], [place] [address]  Phone [phone] Fax [phone] Note: This document was prepared with digital dictation and possible smart phrase technology. Any transcriptional errors that result from this process are unintentional.

## 2018-12-08 ENCOUNTER — Encounter: Payer: Self-pay | Admitting: Adult Health

## 2018-12-09 NOTE — Progress Notes (Signed)
I agree with the above plan 

## 2018-12-16 NOTE — Telephone Encounter (Signed)
GI cocktail requires PA.   Called pt. He states that he will pay out of pocket for it. Faxed pharmacy goodrx card to try.  Pt also wants Anderson Malta informed about his recent stroke and him starting on Plavix. His Egd was cancelled in Jan with [facility].  Pt is still not feeling well.

## 2018-12-16 NOTE — Telephone Encounter (Signed)
If he is still feeling bad, can schedule him return OV with me or other App to discuss further.   Thanks-JLL

## 2018-12-17 ENCOUNTER — Other Ambulatory Visit: Payer: Self-pay | Admitting: Physician Assistant

## 2018-12-18 ENCOUNTER — Other Ambulatory Visit: Payer: Self-pay

## 2018-12-18 MED ORDER — AMBULATORY NON FORMULARY MEDICATION: 3 refills | Status: DC

## 2018-12-18 NOTE — Telephone Encounter (Signed)
Left message informing pt that if he would like an appointment to please return call and schedule.

## 2018-12-18 NOTE — Telephone Encounter (Signed)
Pt returned call. He would like to be seen. Appt scheduled 12/23/18 at 1:45

## 2018-12-23 ENCOUNTER — Other Ambulatory Visit: Payer: Self-pay

## 2018-12-23 ENCOUNTER — Encounter: Payer: Self-pay | Admitting: Physician Assistant

## 2018-12-23 ENCOUNTER — Ambulatory Visit: Payer: Medicare Other | Admitting: Physician Assistant

## 2018-12-23 VITALS — BP 110/70 | HR 80 | Temp 98.4°F | Ht 73.0 in | Wt 168.6 lb

## 2018-12-23 DIAGNOSIS — R1013 Epigastric pain: Secondary | ICD-10-CM | POA: Diagnosis not present

## 2018-12-23 DIAGNOSIS — Z7901 Long term (current) use of anticoagulants: Secondary | ICD-10-CM | POA: Diagnosis not present

## 2018-12-23 DIAGNOSIS — K219 Gastro-esophageal reflux disease without esophagitis: Secondary | ICD-10-CM

## 2018-12-23 MED ORDER — SUCRALFATE 1 G PO TABS: 1.0000 g | ORAL_TABLET | Freq: Three times a day (TID) | ORAL | 1 refills | Status: DC

## 2018-12-23 MED ORDER — FAMOTIDINE 40 MG PO TABS: 40.0000 mg | ORAL_TABLET | Freq: Two times a day (BID) | ORAL | 3 refills | Status: DC

## 2018-12-23 NOTE — Patient Instructions (Signed)
We have sent the following medications to your pharmacy for you to pick up at your convenience:  Normal BMI (Body Mass Index- based on height and weight) is between 19 and 25. Your BMI today is Body mass index is 22.24 kg/m. [name] Please consider follow up  regarding your BMI with your Primary Care Provider.

## 2018-12-23 NOTE — Progress Notes (Signed)
Chief Complaint: Follow-up GERD  HPI:    Mark Dunlap is a 58 year old African-American male with a past medical history as listed below, known to Dr. Fuller Plan, who returns clinic today for follow-up of reflux.     10/23/2018 patient seen in clinic by me and described for the past year he had uncontrolled reflux, initially started on PPI twice daily but could not afford this so was taking his Ranitidine 150 mg and Pantoprazole 40 mg in the morning and then taking Nexium 40 mg and Ranitidine 150 mg before dinner.  He continues with some breakthrough symptoms for which he would chug down a ginger ale.  At that time schedule patient for an EGD.  Also discussed timing of taking his medications.  Prescribed GI cocktail 5-10 mL's as needed for breakthrough symptoms.    10/31/2018 patient seen in the hospital for CVA.  He was started on aspirin and Plavix.  Recommend he stay on dual antiplatelet therapy for 3 weeks then Plavix alone. For this reason could not have EGD.    12/07/2018 follow-up with neurology.  Continued on Plavix 75 mg daily.  Aspirin was stopped.  At that time it was recommended he wait 6 months in order to hold his blood thinner for EGD.    Today, the patient presents to clinic and explains that he continues with an epigastric discomfort and some reflux symptoms.  Was on Zantac 150 mg every morning and nightly until last week when he read about possible cancer side effects and stopped this medication.  Currently he is using Pantoprazole 40 mg about 30 minutes before breakfast and Nexium 40 mg any minutes before dinner as his insurance will not pay for either of these medications twice daily.  Reminds me that he had a recent stroke as above and is supposed to be on therapy for at least 6 months before we can pursue EGD.  Patient does tell me that GI cocktail does help with his symptoms but this is very costly to him as his insurance would not help pay for it.    Denies fever, chills, weight loss,  anorexia, nausea, vomiting or symptoms that awaken him from sleep.  Past Medical History:  Diagnosis Date  . Acute prostatitis 05/23/2009  . Adjustment disorder with depressed mood 09/2007  . CERVICAL RADICULOPATHY, LEFT 08/30/2008  . DJD (degenerative joint disease) of hip   . DJD (degenerative joint disease) of knee    left knee  . GERD (gastroesophageal reflux disease)   . 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 H/O: substance abuse ([place])    hx of ETOH/Crack cocaine-none since 11/08 per pt/Does not Drive due to this  . HYPERTENSION 08/30/2008  . HYPERTHYROIDISM 02/13/2010   pt was told by  Dr Jenny Reichmann  that thyroid was now back to normal .. 2012 ...   . HYPOTENSION 10/31/2009  . 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 Other and unspecified hyperlipidemia 08/20/2013  . PONV (postoperative nausea and vomiting)   . Seasonal allergies   . SPINAL STENOSIS, CERVICAL 08/08/2009  . THYROID NODULE, RIGHT 03/02/2010    Past Surgical History:  Procedure Laterality Date  . ANTERIOR CERVICAL DECOMP/DISCECTOMY FUSION  10/11/2011   Procedure: ANTERIOR CERVICAL DECOMPRESSION/DISCECTOMY FUSION 3 LEVELS;  Surgeon: Eustace Moore;  Location: [place] [facility];  Service: Neurosurgery;  Laterality: N/A;  Cervical three-four ,  cervical four five cervical five six Anterior Cervical Decompression Fusion with peek + plate Nuvasive translational plate Orthofix peek (2 1/2 hours) Rm # [id number]  . ANTERIOR CERVICAL DECOMP/DISCECTOMY FUSION N/A 07/29/2014   Procedure: ANTERIOR CERVICAL DECOMPRESSION/DISCECTOMY FUSION 1 LEVEL/HARDWARE REMOVAL;  Surgeon: Eustace Moore, MD;  Location: [place] [facility];  Service: Neurosurgery;  Laterality: N/A;  cervical six-seven  . POSTERIOR CERVICAL FUSION/FORAMINOTOMY N/A 09/23/2013   Procedure: CERVICALTWO TO CERVICAL SEVEN POSTERIOR CERVICAL FUSION/FORAMINOTOMY WITH LATERAL MASS FIXATION;   Surgeon: Eustace Moore, MD;  Location: [place] [facility];  Service: Neurosurgery;  Laterality: N/A;  . Right Hand I&D  12/07   s/p rdue to abscess-Dr. Amedeo Plenty  . TOTAL HIP ARTHROPLASTY  12/17/2011   Procedure: TOTAL HIP ARTHROPLASTY;  Surgeon: Johnny Bridge, MD;  Location: [facility];  Service: Orthopedics;  Laterality: Left;    Current Outpatient Medications  Medication Sig Dispense Refill  . acetaminophen (TYLENOL) 500 MG tablet Take 1,000 mg by mouth every 6 (six) hours as needed for moderate pain or headache.    [name] adapalene (DIFFERIN) 0.1 % gel APPLY 1 APPLICATION TOPICALLY AT BEDTIME. 45 g 1  . AMBULATORY NON FORMULARY MEDICATION Medication Name: GI Cocktail 90 ml-Viscous lidocaine 90 ml-Dicyclomine 10 mg/69ml 270 ml  Maalox Total ml's 450 ml Take 5-10 ml's and swallow as needed for breakthroughl. 450 mL 3  . atorvastatin (LIPITOR) 40 MG tablet Take 1 tablet (40 mg total) by mouth daily at 6 PM. 90 tablet 1  . benzoyl peroxide (BENZOYL PEROXIDE) 5 % external liquid Apply 1 application topically 2 (two) times daily. 226 g 0  . bimatoprost (LUMIGAN) 0.01 % SOLN Place 1 drop into both eyes at bedtime.    . carboxymethylcellulose (REFRESH PLUS) 0.5 % SOLN Place 1 drop into both eyes 3 (three) times daily as needed (for clear eyes).     . clopidogrel (PLAVIX) 75 MG tablet TAKE 1 TABLET BY MOUTH EVERY DAY 90 tablet 1  . diphenhydrAMINE (BENADRYL) 25 mg capsule Take 50 mg by mouth every 6 (six) hours as needed for itching.     . fluticasone (FLONASE) 50 MCG/ACT nasal spray USE 2 SPRAYS DAILY IN BOTH NOSTRILS (Patient taking differently: Place 2 sprays into both nostrils daily. ) 16 g 1  . gabapentin (NEURONTIN) 100 MG capsule Take 200 mg by mouth 2 (two) times daily.  2  . hydrochlorothiazide (MICROZIDE) 12.5 MG capsule TAKE 1 CAPSULE (12.5 MG TOTAL) DAILY BY MOUTH. 90 capsule 1  . HYDROcodone-acetaminophen (NORCO) 10-325 MG per tablet Take 1 tablet by mouth every 6 (six) hours as needed for moderate  pain. 60 tablet 0  . imiquimod (ALDARA) 5 % cream Apply topically 3 (three) times a week. 12 each 1  . Ketorolac Tromethamine (SPRIX) 15.75 MG/SPRAY SOLN Place 1 spray into the nose every 6 (six) hours as needed (for pain).    . nortriptyline (PAMELOR) 25 MG capsule Take 25 mg by mouth at bedtime.  0  . ondansetron (ZOFRAN) 4 MG tablet TAKE 1 TABLET (4 MG TOTAL) BY MOUTH EVERY 8 (EIGHT) HOURS AS NEEDED FOR NAUSEA OR VOMITING. 20 tablet 2  . oxybutynin (DITROPAN) 5 MG tablet Take 5 mg by mouth at bedtime.  11  . pantoprazole (PROTONIX) 40 MG tablet TAKE 1 TABLET (40 MG TOTAL) BY MOUTH DAILY. (Patient taking differently: Take 40 mg by mouth daily. ) 90 tablet 3  . ranitidine (ZANTAC) 150 MG tablet TAKE 1 TABLET (150 MG TOTAL) 2 (TWO) TIMES DAILY BY  MOUTH. 180 tablet 3  . tamsulosin (FLOMAX) 0.4 MG CAPS capsule TAKE 1 CAPSULE (0.4 MG TOTAL) BY MOUTH TWICE DAILY 180 capsule 1  . timolol (TIMOPTIC) 0.5 % ophthalmic solution Place 1 drop into both eyes daily.  6  . tiZANidine (ZANAFLEX) 4 MG capsule TAKE 1 CAPSULE BY ORAL ROUTE 3 TIMES EVERY DAY (Patient taking differently: Take 4 mg by mouth 3 (three) times daily as needed for muscle spasms. ) 60 capsule 2  . tretinoin (RETIN-A) 0.1 % cream APPLY TO AFFECTED AREA AT BEDTIME TO FACE 45 g 2  . zolpidem (AMBIEN) 10 MG tablet TAKE 1 TABLET AT BEDTIME AS NEEDED FOR SLEEP (Patient taking differently: Take 10 mg by mouth at bedtime as needed for sleep. ) 90 tablet 1   No current facility-administered medications for this visit.     Allergies as of 12/23/2018 - Review Complete 12/08/2018  Allergen Reaction Noted  . Hydrocodone Itching and Other (See Comments) 08/18/2013  . Tramadol Itching 10/03/2011  . Morphine and related Itching 12/06/2011  . Oxycodone Itching 11/20/2017    Family History  Problem Relation Age of Onset  . Alcohol abuse Father   . Stroke Father   . Heart disease Father   . Arthritis Mother   . Hyperlipidemia Mother   . Goiter  Mother        resection of benign goiter  . Hypertension Sister   . Diabetes Sister   . Goiter Sister        resection of benign goiter  . Diabetes Brother   . Alcohol abuse Brother   . Alcohol abuse Cousin   . Heart disease Other        Aunt  . Colon cancer Neg Hx     Social History   Socioeconomic History  . Marital status: Single    Spouse name: Not on file  . Number of children: 0  . Years of education: Not on file  . Highest education level: Not on file  Occupational History  . Occupation: Industries for the [employer]  . Financial resource strain: Not hard at all  . Food insecurity:    Worry: Never true    Inability: Never true  . Transportation needs:    Medical: No    Non-medical: No  Tobacco Use  . Smoking status: Current Every Day Smoker    Packs/day: 0.50    Years: 40.00    Pack years: 20.00    Types: Cigarettes  . Smokeless tobacco: Never Used  . Tobacco comment: last crack over 1 yr   alcohol last time few yrs. States today is his quit date for smoking   Substance and Sexual Activity  . Alcohol use: No    Alcohol/week: 0.0 standard drinks    Comment: hx of alcohol abuse  . Drug use: Yes    Types: "Crack" cocaine    Comment: history of, " clean for 24 MONTHS"  . Sexual activity: Not Currently  Lifestyle  . Physical activity:    Days per week: 0 days    Minutes per session: 0 min  . Stress: Rather much  Relationships  . Social connections:    Talks on phone: More than three times a week    Gets together: More than three times a week    Attends religious service: More than 4 times per year    Active member of club or organization: Yes    Attends meetings of clubs or organizations: More than  4 times per year    Relationship status: Never married  . Intimate partner violence:    Fear of current or ex partner: Not on file    Emotionally abused: Not on file    Physically abused: Not on file    Forced sexual activity: Not on file  Other  Topics Concern  . Not on file  Social History Narrative  . Not on file    Review of Systems:    Constitutional: No weight loss, fever or chills Cardiovascular: No chest pain Respiratory: No SOB  Gastrointestinal: See HPI and otherwise negative   Physical Exam:  Vital signs: BP 110/70   Pulse 80   Temp 98.4 F (36.9 C) (Oral)   Ht 6\' 1"  (1.854 m)   Wt 168 lb 9.6 oz (76.5 kg)   BMI 22.24 kg/m   Constitutional:   Pleasant AA male appears to be in NAD, Well developed, Well nourished, alert and cooperative Respiratory: Respirations even and unlabored. Lungs clear to auscultation bilaterally.   No wheezes, crackles, or rhonchi.  Cardiovascular: Normal S1, S2. No MRG. Regular rate and rhythm. No peripheral edema, cyanosis or pallor.  Gastrointestinal:  Soft, mild epigastric ttp, nontender. No rebound or guarding. Normal bowel sounds. No appreciable masses or hepatomegaly. Psychiatric: Demonstrates good judgement and reason without abnormal affect or behaviors.  RELEVANT LABS AND IMAGING: CBC    Component Value Date/Time   WBC 10.6 (H) 10/26/2018 0435   RBC 4.74 10/26/2018 0435   HGB 15.3 10/26/2018 0443   HCT 45.0 10/26/2018 0443   PLT 327 10/26/2018 0435   MCV 94.3 10/26/2018 0435   MCH 29.1 10/26/2018 0435   MCHC 30.9 10/26/2018 0435   RDW 13.8 10/26/2018 0435   LYMPHSABS 1.9 10/26/2018 0435   MONOABS 1.2 (H) 10/26/2018 0435   EOSABS 0.2 10/26/2018 0435   BASOSABS 0.1 10/26/2018 0435    CMP     Component Value Date/Time   NA 138 10/26/2018 0443   K 3.4 (L) 10/26/2018 0443   CL 103 10/26/2018 0443   CO2 24 10/26/2018 0435   GLUCOSE 101 (H) 10/26/2018 0443   BUN 14 10/26/2018 0443   CREATININE 1.10 10/26/2018 0443   CALCIUM 9.2 10/26/2018 0435   PROT 6.8 10/26/2018 0435   ALBUMIN 3.7 10/26/2018 0435   AST 24 10/26/2018 0435   ALT 27 10/26/2018 0435   ALKPHOS 99 10/26/2018 0435   BILITOT 0.9 10/26/2018 0435   GFRNONAA >60 10/26/2018 0435   GFRAA >60 10/26/2018  0435    Assessment: 1.  GERD: Breakthrough symptoms 2.  Epigastric pain: Continues per the patient on a twice daily PPI and twice daily H2 blocker 3.  Chronic anticoagulation: Patient is a started on Plavix status post stroke in January, recommend he stay on this for 6 months before stopping for any elective procedures  Plan: 1.  Stop Zantac.  Prescribed Pepcid 40 mg twice daily, every morning and nightly #60 with 3 refills 2.  Continue Pantoprazole 40 mg 30-60 minutes before breakfast and Nexium 40 mg 30-60 minutes before dinner. 3.  Reviewed antireflux diet and lifestyle modifications.  Patient has stopped drinking ginger ale's. 4.  We will try to get a prior authorization for GI cocktail which is helpful for the patient given that he will have to wait at least another 4 months before EGD. 5.  Prescribed Carafate 1 g 4 times daily, 20 to 30 minutes after meals and at bedtime (timing is slightly different with this patient given  all of his other maintenance medications) 6.  Patient to follow in clinic with me in 4-5 months.  At that time hopefully we can arrange for an EGD off of his Plavix for 5 days. 7.  Patient to call in the interim if he has any concerns.  Ellouise Newer, PA-C [facility] 12/23/2018, 1:35 PM  Cc: Biagio Borg, MD

## 2018-12-24 ENCOUNTER — Telehealth: Payer: Self-pay | Admitting: Physician Assistant

## 2018-12-24 NOTE — Telephone Encounter (Signed)
Famotidine was sent to [facility] today by Claiborne Billings. Pt was advised.

## 2018-12-24 NOTE — Telephone Encounter (Signed)
Pt called and said "somebody called me about my pills or whatever that was sent to [facility] from [facility]."  Please return pt's call.  Pt does not know the name of the medication or with whom he had spoken.

## 2018-12-27 NOTE — Progress Notes (Signed)
Reviewed and agree with initial management plan.  Kary Colaizzi T. Deepak Bless, MD FACG 

## 2019-01-12 ENCOUNTER — Telehealth: Payer: Self-pay | Admitting: *Deleted

## 2019-01-12 NOTE — Telephone Encounter (Signed)
Called patient and LVM to inform them the nurse needs to either convert their upcoming AWV to a virtual visit or reschedule the visit out into the future due to covid-19 safety measures. Nurse requested that the patient call-back and stated she would call them back at a later date.    

## 2019-01-25 ENCOUNTER — Telehealth: Payer: Self-pay | Admitting: Adult Health

## 2019-01-25 NOTE — Telephone Encounter (Signed)
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  Placed on DAPT initially and currently continues on Plavix alone.  Typically recommend waiting 6 months post stroke for any type of elective procedures with need of stopping blood thinners due to increase risk of recurrent stroke during that time period.  If emergent need of procedure where benefit of procedure outweighs risk of recurrent stroke, he can discontinue Plavix for 7 days prior and restart immediately after once able with small but acceptable risk of recurrent stroke while off Plavix.

## 2019-01-25 NOTE — Telephone Encounter (Signed)
Chesley.Pouch Dr Marlan Palau office([facility]) called stating on 03-31 she sent a clearance for pt to stop Plavix for days 7 days before Prostate biopsy on 04-24.  No call requested, Danae Chen said this can be faxed over to [phone]

## 2019-01-26 NOTE — Telephone Encounter (Signed)
Left vm for Erica at [facility] to call back. I stated a clearance form was never receive or given to me. I requested a call back because pt had stroke in 10/2018 and usually pts wait 6 months. I need to know if this is urgent or can wait till July 2020.

## 2019-01-28 NOTE — Telephone Encounter (Signed)
I spoke with Mark Dunlap at [facility]. She stated per the urology MD its not urgent for pt to have the prostate biopsy. SEh receive my message and they will r/s him in JUn 2020 which is 6 momths from his stroke. She stated his PSA numbers were just high. I advise Erica to please send [place] clearance form at least 3 to 4 weeks in advance to complete for pt.She verbalized understanding.

## 2019-01-28 NOTE — Telephone Encounter (Signed)
Left another message for Danae Chen at [phone] if pt had stroke in January 2020. Janett Billow NP needs to know if this is a urgent procedure. Typically pts  need to wait 6 months from having a stroke because of the high risk factor.

## 2019-02-03 ENCOUNTER — Telehealth: Payer: Self-pay | Admitting: *Deleted

## 2019-02-03 NOTE — Telephone Encounter (Signed)
Called patient to inform them the nurse needs to either convert their upcoming AWV to a virtual visit or reschedule the visit out into the future due to covid-19 safety measures. AWV was reschedule on 05/03/19.

## 2019-02-07 ENCOUNTER — Other Ambulatory Visit: Payer: Self-pay | Admitting: Internal Medicine

## 2019-02-10 ENCOUNTER — Telehealth: Payer: Self-pay | Admitting: Adult Health

## 2019-02-10 ENCOUNTER — Ambulatory Visit: Payer: Medicare Other

## 2019-02-10 NOTE — Telephone Encounter (Signed)
Mark Dunlap is calling from [facility] stating she needs his medical clearance for surgery  She has faxed already. [phone] ext [phone] .

## 2019-02-10 NOTE — Telephone Encounter (Signed)
I return Mark Dunlap call that pt had stroke in January 2020. The plavix is prescribed by his PCP. I stated a message will be sent to [facility] NP if she wants to clear the pt for Mar 04 2019 appt for  injections in his back. I stated its preferred to wait 6 months. I stated message will be sent to her. Mark Dunlap stated the form can be fax back to [phone], and [phone]. Mark Dunlap verbalized understanding.

## 2019-02-10 NOTE — Telephone Encounter (Addendum)
Juliann Pulse from [facility] called they are wanting to get clarence to do injections bc Janett Billow has prescribed the plavis. Her phone number is [phone] ext. [phone].       Documentation

## 2019-02-10 NOTE — Telephone Encounter (Signed)
Jarrett Soho from [facility] called they are wanting to get clarence to do injections bc Janett Billow has prescribed the plavis. Her phone number is [phone] ext. [phone].

## 2019-02-10 NOTE — Telephone Encounter (Signed)
As his stroke was on 10/26/2018, it is recommended to wait at least 6 months post stroke for any type of elective procedure that requires stopping of blood thinners.  It would be recommended to reschedule injections towards mid to end of June and if he continues to be stable from a stroke standpoint, he will be cleared to discontinue Plavix 3 to 5 days prior to procedure and restart immediately after.  Even after 6 months, there is a small but acceptable periprocedural risk of recurrent stroke while off blood thinner.

## 2019-02-10 NOTE — Telephone Encounter (Signed)
Me       02/10/19 12:45 PM  Note    I return Jarrett Soho call that pt had stroke in January 2020. The plavix is prescribed by his PCP. I stated a message will be sent to [facility] NP if she wants to clear the pt for Mar 04 2019 appt for  injections in his back. I stated its preferred to wait 6 months. I stated message will be sent to her. Jarrett Soho stated the form can be fax back to [phone], and [phone]. Jarrett Soho verbalized understanding

## 2019-02-10 NOTE — Telephone Encounter (Addendum)
RN receive form on 02/09/2019.  Left message for Ilean China that pt had stroke in January 2020 and is preferred to wait 6 months from that date. Also pt wanted clearance for a prostate biopsy this month that was put on hold. LEft vm if this was a urgent surgery or was it elective.Also left vm that the form has cardiac clearance. The form needs to be change to neurology clearance.

## 2019-02-15 NOTE — Telephone Encounter (Signed)
I fax clearance form based on Jessica NP recommendations. Pt will have to wait till June 2020. Form fax to [phone] and [phone] to [employer] [facility]. Both numbers stated fax confirmed and receive.

## 2019-02-27 ENCOUNTER — Other Ambulatory Visit: Payer: Self-pay | Admitting: Internal Medicine

## 2019-03-03 ENCOUNTER — Other Ambulatory Visit: Payer: Self-pay | Admitting: Internal Medicine

## 2019-03-15 NOTE — Telephone Encounter (Signed)
[email or web address] office has called for the clearance for pt to come off the Plavix before procedure.  Danae Chen said she will also fax the request over.

## 2019-03-19 ENCOUNTER — Telehealth: Payer: Self-pay

## 2019-03-19 NOTE — Telephone Encounter (Signed)
Patient calling for varication of future appointments.  Related when the appointments are.

## 2019-03-30 ENCOUNTER — Ambulatory Visit (INDEPENDENT_AMBULATORY_CARE_PROVIDER_SITE_OTHER): Payer: Medicare Other | Admitting: Internal Medicine

## 2019-03-30 ENCOUNTER — Telehealth: Payer: Self-pay | Admitting: Adult Health

## 2019-03-30 ENCOUNTER — Encounter: Payer: Self-pay | Admitting: Internal Medicine

## 2019-03-30 DIAGNOSIS — E785 Hyperlipidemia, unspecified: Secondary | ICD-10-CM

## 2019-03-30 DIAGNOSIS — I10 Essential (primary) hypertension: Secondary | ICD-10-CM | POA: Diagnosis not present

## 2019-03-30 DIAGNOSIS — R05 Cough: Secondary | ICD-10-CM

## 2019-03-30 DIAGNOSIS — R059 Cough, unspecified: Secondary | ICD-10-CM

## 2019-03-30 NOTE — Progress Notes (Signed)
Patient ID: Mark Dunlap, male   DOB: October 24, 1960, 58 y.o.   MRN: [id number]  Virtual Visit via Video Note  I connected with [name] on 03/30/19 at  1:20 PM EDT by a video enabled telemedicine application and verified that I am speaking with the correct person using two identifiers.  Location: Patient: at home Provider: at office   I discussed the limitations of evaluation and management by telemedicine and the availability of in person appointments. The patient expressed understanding and agreed to proceed.  History of Present Illness: Here to f/u with c/o a coughing fit for about an hour yesterday at work, non productive, no fever, seemed to happen during lunch but not sure about aspiration, but did vomit x 1; No ST, sinus pain, ear pain, and Pt denies chest pain, increased sob or doe, wheezing, orthopnea, PND, increased LE swelling, palpitations, dizziness or syncope.   Has no further n/v or cough since then but sent home from work, asked to come back with a note to certify he is not ill or has any communicable disease.   Pt denies polydipsia, polyuria Past Medical History:  Diagnosis Date  . Acute prostatitis 05/23/2009  . Adjustment disorder with depressed mood 09/2007  . CERVICAL RADICULOPATHY, LEFT 08/30/2008  . CVA (cerebrovascular accident) ([place])   . DJD (degenerative joint disease) of hip   . DJD (degenerative joint disease) of knee    left knee  . GERD (gastroesophageal reflux disease)   . 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 H/O: substance abuse ([place])    hx of ETOH/Crack cocaine-none since 11/08 per pt/Does not Drive due to this  . HYPERTENSION 08/30/2008  . HYPERTHYROIDISM 02/13/2010   pt was told by  Dr Jenny Reichmann  that thyroid was now back to normal .. 2012 ...   . HYPOTENSION 10/31/2009  . 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 Other and unspecified  hyperlipidemia 08/20/2013  . PONV (postoperative nausea and vomiting)   . Seasonal allergies   . SPINAL STENOSIS, CERVICAL 08/08/2009  . THYROID NODULE, RIGHT 03/02/2010   Past Surgical History:  Procedure Laterality Date  . ANTERIOR CERVICAL DECOMP/DISCECTOMY FUSION  10/11/2011   Procedure: ANTERIOR CERVICAL DECOMPRESSION/DISCECTOMY FUSION 3 LEVELS;  Surgeon: Eustace Moore;  Location: [facility];  Service: Neurosurgery;  Laterality: N/A;  Cervical three-four ,cervical four five cervical five six Anterior Cervical Decompression Fusion with peek + plate Nuvasive translational plate Orthofix peek (2 1/2 hours) Rm # [id number]  . ANTERIOR CERVICAL DECOMP/DISCECTOMY FUSION N/A 07/29/2014   Procedure: ANTERIOR CERVICAL DECOMPRESSION/DISCECTOMY FUSION 1 LEVEL/HARDWARE REMOVAL;  Surgeon: Eustace Moore, MD;  Location: [facility];  Service: Neurosurgery;  Laterality: N/A;  cervical six-seven  . POSTERIOR CERVICAL FUSION/FORAMINOTOMY N/A 09/23/2013   Procedure: CERVICALTWO TO CERVICAL SEVEN POSTERIOR CERVICAL FUSION/FORAMINOTOMY WITH LATERAL MASS FIXATION;  Surgeon: Eustace Moore, MD;  Location: [facility];  Service: Neurosurgery;  Laterality: N/A;  . Right Hand I&D  12/07   s/p rdue to abscess-Dr. Amedeo Plenty  . TOTAL HIP ARTHROPLASTY  12/17/2011   Procedure: TOTAL HIP ARTHROPLASTY;  Surgeon: Johnny Bridge, MD;  Location: [facility];  Service: Orthopedics;  Laterality: Left;    reports that he has been smoking cigarettes. He has a 20.00 pack-year smoking history. He has never used smokeless tobacco. He reports current drug use. Drug: "Crack" cocaine.  He reports that he does not drink alcohol. family history includes Alcohol abuse in his brother, cousin, and father; Arthritis in his mother; Diabetes in his brother and sister; Goiter in his mother and sister; Heart disease in his father and another family member; Hyperlipidemia in his mother; Hypertension in his sister; Stroke in his father. Allergies  Allergen  Reactions  . Hydrocodone Itching and Other (See Comments)    Pt can tolerate  . Tramadol Itching  . Morphine And Related Itching  . Oxycodone Itching   Current Outpatient Medications on File Prior to Visit  Medication Sig Dispense Refill  . acetaminophen (TYLENOL) 500 MG tablet Take 1,000 mg by mouth every 6 (six) hours as needed for moderate pain or headache.    [name] adapalene (DIFFERIN) 0.1 % gel APPLY 1 APPLICATION TOPICALLY AT BEDTIME. 45 g 1  . AMBULATORY NON FORMULARY MEDICATION Medication Name: GI Cocktail 90 ml-Viscous lidocaine 90 ml-Dicyclomine 10 mg/50ml 270 ml  Maalox Total ml's 450 ml Take 5-10 ml's and swallow as needed for breakthroughl. 450 mL 3  . atorvastatin (LIPITOR) 40 MG tablet TAKE 1 TABLET BY MOUTH ONCE DAILY AT 6PM 180 tablet 1  . benzoyl peroxide (BENZOYL PEROXIDE) 5 % external liquid Apply 1 application topically 2 (two) times daily. 226 g 0  . bimatoprost (LUMIGAN) 0.01 % SOLN Place 1 drop into both eyes at bedtime.    . carboxymethylcellulose (REFRESH PLUS) 0.5 % SOLN Place 1 drop into both eyes 3 (three) times daily as needed (for clear eyes).     . clopidogrel (PLAVIX) 75 MG tablet TAKE 1 TABLET BY MOUTH EVERY DAY 90 tablet 1  . diphenhydrAMINE (BENADRYL) 25 mg capsule Take 50 mg by mouth every 6 (six) hours as needed for itching.     . famotidine (PEPCID) 40 MG tablet Take 1 tablet (40 mg total) by mouth 2 (two) times daily. Take every morning and at bedtime 60 tablet 3  . fluticasone (FLONASE) 50 MCG/ACT nasal spray USE 2 SPRAYS DAILY IN BOTH NOSTRILS (Patient taking differently: Place 2 sprays into both nostrils daily. ) 16 g 1  . gabapentin (NEURONTIN) 100 MG capsule Take 200 mg by mouth 2 (two) times daily.  2  . hydrochlorothiazide (MICROZIDE) 12.5 MG capsule TAKE 1 CAPSULE (12.5 MG TOTAL) DAILY BY MOUTH. 90 capsule 1  . HYDROcodone-acetaminophen (NORCO) 10-325 MG per tablet Take 1 tablet by mouth every 6 (six) hours as needed for moderate pain. 60 tablet 0   . imiquimod (ALDARA) 5 % cream Apply topically 3 (three) times a week. 12 each 1  . Ketorolac Tromethamine (SPRIX) 15.75 MG/SPRAY SOLN Place 1 spray into the nose every 6 (six) hours as needed (for pain).    . nortriptyline (PAMELOR) 25 MG capsule Take 25 mg by mouth at bedtime.  0  . ondansetron (ZOFRAN) 4 MG tablet TAKE 1 TABLET (4 MG TOTAL) BY MOUTH EVERY 8 (EIGHT) HOURS AS NEEDED FOR NAUSEA OR VOMITING. 20 tablet 2  . oxybutynin (DITROPAN) 5 MG tablet Take 5 mg by mouth at bedtime.  11  . pantoprazole (PROTONIX) 40 MG tablet TAKE 1 TABLET (40 MG TOTAL) BY MOUTH DAILY. (Patient taking differently: Take 40 mg by mouth daily. ) 90 tablet 3  . ranitidine (ZANTAC) 150 MG tablet TAKE 1 TABLET (150 MG TOTAL) 2 (TWO) TIMES DAILY BY MOUTH. 180 tablet 3  . sucralfate (CARAFATE) 1 g tablet Take 1 tablet (1 g total) by mouth 3 (three) times daily. Take 20 minutes after  meals and at bedtime 120 tablet 1  . tamsulosin (FLOMAX) 0.4 MG CAPS capsule TAKE 1 CAPSULE (0.4 MG TOTAL) BY MOUTH TWICE DAILY 180 capsule 1  . timolol (TIMOPTIC) 0.5 % ophthalmic solution Place 1 drop into both eyes daily.  6  . tiZANidine (ZANAFLEX) 4 MG capsule TAKE 1 CAPSULE BY ORAL ROUTE 3 TIMES EVERY DAY (Patient taking differently: Take 4 mg by mouth 3 (three) times daily as needed for muscle spasms. ) 60 capsule 2  . tretinoin (RETIN-A) 0.1 % cream APPLY TO AFFECTED AREA AT BEDTIME TO FACE 45 g 2  . zolpidem (AMBIEN) 10 MG tablet TAKE 1 TABLET AT BEDTIME AS NEEDED FOR SLEEP (Patient taking differently: Take 10 mg by mouth at bedtime as needed for sleep. ) 90 tablet 1   No current facility-administered medications on file prior to visit.     Observations/Objective: Alert, NAD, appropriate mood and affect, resps normal, cn 2-12 intact, moves all 4s, no visible rash or swelling Lab Results  Component Value Date   WBC 10.6 (H) 10/26/2018   HGB 15.3 10/26/2018   HCT 45.0 10/26/2018   PLT 327 10/26/2018   GLUCOSE 101 (H)  10/26/2018   CHOL 194 10/27/2018   TRIG 217 (H) 10/27/2018   HDL 47 10/27/2018   LDLDIRECT 100.0 09/02/2018   LDLCALC 104 (H) 10/27/2018   ALT 27 10/26/2018   AST 24 10/26/2018   NA 138 10/26/2018   K 3.4 (L) 10/26/2018   CL 103 10/26/2018   CREATININE 1.10 10/26/2018   BUN 14 10/26/2018   CO2 24 10/26/2018   TSH 2.35 09/02/2018   PSA 6.11 (H) 10/20/2018   INR 0.94 10/26/2018   HGBA1C 5.1 10/27/2018   Assessment and Plan: See notes  Follow Up Instructions: See notes   I discussed the assessment and treatment plan with the patient. The patient was provided an opportunity to ask questions and all were answered. The patient agreed with the plan and demonstrated an understanding of the instructions.   The patient was advised to call back or seek an in-person evaluation if the symptoms worsen or if the condition fails to improve as anticipated   Cathlean Cower, MD

## 2019-03-30 NOTE — Patient Instructions (Signed)
Ok for your note to return to work  Please continue all other medications as before, and refills have been done if requested.  Please have the pharmacy call with any other refills you may need.  Please continue your efforts at being more active, low cholesterol diet, and weight control.  Please keep your appointments with your specialists as you may have planned

## 2019-03-30 NOTE — Assessment & Plan Note (Signed)
stable overall by history and exam, recent data reviewed with pt, and pt to continue medical treatment as before,  to f/u any worsening symptoms or concerns  

## 2019-03-30 NOTE — Assessment & Plan Note (Signed)
Transient yesterday with possible aspiration with eating, but no fever and no other s/s of acute illness; no recurrence cough today, ok for note to return to work

## 2019-03-30 NOTE — Telephone Encounter (Signed)
Erica @ Dr Purvis [place] office is calling re: the status of clearance on Biopsy. Please call [phone] option 6

## 2019-03-30 NOTE — Telephone Encounter (Signed)
Prostate biopsy currently scheduled on 05/11/2019 through [facility] urology specialists.  Requesting clearance to discontinue Plavix 75 mg 7 days prior to procedure.  As patient stroke occurred in 10/2018, scheduled procedure date will be greater than 6 months since prior stroke which is the recommended wait time.  As he has been stable from a stroke standpoint, he is cleared to d/c Plavix 75 mg 7 days prior to procedure as requested and to restart immediately after, once indicated by urology.  There is a small but acceptable periprocedural risk of recurrent stroke while off Plavix therapy.   A fax will be returned to [facility] urology specialists at [phone]

## 2019-03-30 NOTE — Telephone Encounter (Signed)
I called Erica at [place] office and was transfer. Nurse was on hold for over 8 minutes.Nurse will call back.

## 2019-03-30 NOTE — Telephone Encounter (Signed)
Fax clearance telephone note to [phone]. Fax twice and confirmed.

## 2019-03-30 NOTE — Assessment & Plan Note (Signed)
Encouraged pt to continue to monitor BP at home and next visit

## 2019-04-02 ENCOUNTER — Other Ambulatory Visit: Payer: Self-pay | Admitting: Internal Medicine

## 2019-04-12 ENCOUNTER — Other Ambulatory Visit: Payer: Self-pay | Admitting: Internal Medicine

## 2019-04-13 ENCOUNTER — Telehealth: Payer: Self-pay | Admitting: Internal Medicine

## 2019-04-13 NOTE — Telephone Encounter (Signed)
Pt called stating he needs medications refilled. Pt states he does not know names of them, but he needs all of them refilled. Pt states he is at work and he cannot provide names. Pt was advised he would get a call back if further information was needed. Please advise.   [facility] ([address] [place] - [address]  [address] [place] ([place]) [place] [address]  Phone: [phone] Fax: [phone]  Not a 24 hour pharmacy; exact hours not known.

## 2019-04-13 NOTE — Telephone Encounter (Signed)
Noted  

## 2019-04-13 NOTE — Telephone Encounter (Signed)
Pt should call us with the name of medication needed. I have looked at quite a few that still had refills on them. Two of them we're sent this morning.

## 2019-04-13 NOTE — Telephone Encounter (Signed)
Spoke with patient, informed him of the other 2 that was sent. He is going to check with pharmacy and see if anymore are needed, he isn't sure. If so he will have them send a request.

## 2019-05-03 ENCOUNTER — Encounter: Payer: Self-pay | Admitting: Internal Medicine

## 2019-05-03 ENCOUNTER — Ambulatory Visit (INDEPENDENT_AMBULATORY_CARE_PROVIDER_SITE_OTHER): Payer: Medicare Other | Admitting: *Deleted

## 2019-05-03 ENCOUNTER — Other Ambulatory Visit: Payer: Self-pay

## 2019-05-03 ENCOUNTER — Other Ambulatory Visit (INDEPENDENT_AMBULATORY_CARE_PROVIDER_SITE_OTHER): Payer: Medicare Other

## 2019-05-03 ENCOUNTER — Ambulatory Visit (INDEPENDENT_AMBULATORY_CARE_PROVIDER_SITE_OTHER): Payer: Medicare Other | Admitting: Internal Medicine

## 2019-05-03 VITALS — BP 143/72 | HR 80 | Temp 97.8°F | Resp 17 | Ht 73.0 in | Wt 165.0 lb

## 2019-05-03 VITALS — BP 143/72 | HR 90 | Temp 97.8°F | Ht 73.0 in | Wt 165.0 lb

## 2019-05-03 DIAGNOSIS — E559 Vitamin D deficiency, unspecified: Secondary | ICD-10-CM

## 2019-05-03 DIAGNOSIS — E611 Iron deficiency: Secondary | ICD-10-CM

## 2019-05-03 DIAGNOSIS — F17200 Nicotine dependence, unspecified, uncomplicated: Secondary | ICD-10-CM

## 2019-05-03 DIAGNOSIS — Z0001 Encounter for general adult medical examination with abnormal findings: Secondary | ICD-10-CM

## 2019-05-03 DIAGNOSIS — R739 Hyperglycemia, unspecified: Secondary | ICD-10-CM

## 2019-05-03 DIAGNOSIS — E785 Hyperlipidemia, unspecified: Secondary | ICD-10-CM | POA: Diagnosis not present

## 2019-05-03 DIAGNOSIS — E538 Deficiency of other specified B group vitamins: Secondary | ICD-10-CM | POA: Diagnosis not present

## 2019-05-03 DIAGNOSIS — F329 Major depressive disorder, single episode, unspecified: Secondary | ICD-10-CM

## 2019-05-03 DIAGNOSIS — I10 Essential (primary) hypertension: Secondary | ICD-10-CM

## 2019-05-03 DIAGNOSIS — F419 Anxiety disorder, unspecified: Secondary | ICD-10-CM

## 2019-05-03 DIAGNOSIS — N289 Disorder of kidney and ureter, unspecified: Secondary | ICD-10-CM | POA: Diagnosis not present

## 2019-05-03 DIAGNOSIS — R972 Elevated prostate specific antigen [PSA]: Secondary | ICD-10-CM

## 2019-05-03 DIAGNOSIS — F32A Depression, unspecified: Secondary | ICD-10-CM | POA: Insufficient documentation

## 2019-05-03 DIAGNOSIS — Z0000 Encounter for general adult medical examination without abnormal findings: Secondary | ICD-10-CM

## 2019-05-03 LAB — CBC WITH DIFFERENTIAL/PLATELET
Basophils Absolute: 0.1 10*3/uL (ref 0.0–0.1)
Basophils Relative: 1.1 % (ref 0.0–3.0)
Eosinophils Absolute: 0.2 10*3/uL (ref 0.0–0.7)
Eosinophils Relative: 1.8 % (ref 0.0–5.0)
HCT: 44.6 % (ref 39.0–52.0)
Hemoglobin: 14.6 g/dL (ref 13.0–17.0)
Lymphocytes Relative: 23.2 % (ref 12.0–46.0)
Lymphs Abs: 2.4 10*3/uL (ref 0.7–4.0)
MCHC: 32.7 g/dL (ref 30.0–36.0)
MCV: 92.1 fl (ref 78.0–100.0)
Monocytes Absolute: 1.1 10*3/uL — ABNORMAL HIGH (ref 0.1–1.0)
Monocytes Relative: 10.8 % (ref 3.0–12.0)
Neutro Abs: 6.5 10*3/uL (ref 1.4–7.7)
Neutrophils Relative %: 63.1 % (ref 43.0–77.0)
Platelets: 384 10*3/uL (ref 150.0–400.0)
RBC: 4.84 Mil/uL (ref 4.22–5.81)
RDW: 13.7 % (ref 11.5–15.5)
WBC: 10.2 10*3/uL (ref 4.0–10.5)

## 2019-05-03 LAB — HEMOGLOBIN A1C: Hgb A1c MFr Bld: 5.5 % (ref 4.6–6.5)

## 2019-05-03 MED ORDER — CITALOPRAM HYDROBROMIDE 20 MG PO TABS: 20.0000 mg | ORAL_TABLET | Freq: Every day | ORAL | 3 refills | Status: DC

## 2019-05-03 MED ORDER — IMIQUIMOD 5 % EX CREA: TOPICAL_CREAM | CUTANEOUS | 2 refills | Status: DC

## 2019-05-03 MED ORDER — ADAPALENE 0.1 % EX GEL: 1.0000 "application " | Freq: Every day | CUTANEOUS | 1 refills | Status: DC

## 2019-05-03 NOTE — Patient Instructions (Addendum)
Please take all new medication as prescribed - the celexa 20 mg per day  Please continue all other medications as before, and refills have been done if requested.  Please have the pharmacy call with any other refills you may need.  Please continue your efforts at being more active, low cholesterol diet, and weight control.  You are otherwise up to date with prevention measures today.  Please keep your appointments with your specialists as you may have planned  Please go to the LAB in the Basement (turn left off the elevator) for the tests to be done today  You will be contacted by phone if any changes need to be made immediately.  Otherwise, you will receive a letter about your results with an explanation, but please check with MyChart first.  Please remember to sign up for MyChart if you have not done so, as this will be important to you in the future with finding out test results, communicating by private email, and scheduling acute appointments online when needed.  Please return in 6 months, or sooner if needed

## 2019-05-03 NOTE — Progress Notes (Addendum)
Subjective:   IHAN PAT is a 58 y.o. male who presents for Medicare Annual/Subsequent preventive examination.  Review of Systems:   Cardiac Risk Factors include: advanced age (>57men, >75 women);dyslipidemia;male gender;hypertension Sleep patterns: gets up 1-2 times nightly to void and sleeps 4-5 hours nightly. Patient reports insomnia issues, discussed recommended sleep tips and stress reduction tips, education was attached to patient's AVS. Relevant patient education assigned to patient using Emmi.  Home Safety/Smoke Alarms: Feels safe in home. Smoke alarms in place.  Living environment; residence and Firearm Safety: 1-story house/ trailer.Lives with mother who he takes care of, no needs for DME, good support system Seat Belt Safety/Bike Helmet: Wears seat belt.   PSA-  Lab Results  Component Value Date   PSA 6.11 (H) 10/20/2018   PSA 5.50 (H) 09/02/2018   PSA 3.73 08/08/2017       Objective:    Vitals: There were no vitals taken for this visit.  There is no height or weight on file to calculate BMI.  Advanced Directives 05/03/2019 10/26/2018 10/26/2018 02/05/2018 11/28/2017 11/27/2017 11/21/2017  Does Patient Have a Medical Advance Directive? No No No No No No No  Would patient like information on creating a medical advance directive? No - Patient declined No - Patient declined No - Patient declined No - Patient declined No - Patient declined No - Patient declined -  Pre-existing out of facility DNR order (yellow form or pink MOST form) - - - - - - -    Tobacco Social History   Tobacco Use  Smoking Status Current Every Day Smoker  . Packs/day: 0.50  . Years: 40.00  . Pack years: 20.00  . Types: Cigarettes  Smokeless Tobacco Never Used     Ready to quit: Yes Counseling given: Yes   Past Medical History:  Diagnosis Date  . Acute prostatitis 05/23/2009  . Adjustment disorder with depressed mood 09/2007  . CERVICAL RADICULOPATHY, LEFT 08/30/2008  . CVA  (cerebrovascular accident) ([place])   . DJD (degenerative joint disease) of hip   . DJD (degenerative joint disease) of knee    left knee  . GERD (gastroesophageal reflux disease)   . 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 H/O: substance abuse ([place])    hx of ETOH/Crack cocaine-none since 11/08 per pt/Does not Drive due to this  . HYPERTENSION 08/30/2008  . HYPERTHYROIDISM 02/13/2010   pt was told by  Dr Jenny Reichmann  that thyroid was now back to normal .. 2012 ...   . HYPOTENSION 10/31/2009  . 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 Other and unspecified hyperlipidemia 08/20/2013  . PONV (postoperative nausea and vomiting)   . Seasonal allergies   . SPINAL STENOSIS, CERVICAL 08/08/2009  . THYROID NODULE, RIGHT 03/02/2010   Past Surgical History:  Procedure Laterality Date  . ANTERIOR CERVICAL DECOMP/DISCECTOMY FUSION  10/11/2011   Procedure: ANTERIOR CERVICAL DECOMPRESSION/DISCECTOMY FUSION 3 LEVELS;  Surgeon: Eustace Moore;  Location: [facility];  Service: Neurosurgery;  Laterality: N/A;  Cervical three-four ,cervical four five cervical five six Anterior Cervical Decompression Fusion with peek + plate Nuvasive translational plate Orthofix peek (2 1/2 hours) Rm # [id number]  . ANTERIOR CERVICAL DECOMP/DISCECTOMY FUSION N/A 07/29/2014   Procedure: ANTERIOR CERVICAL DECOMPRESSION/DISCECTOMY FUSION 1 LEVEL/HARDWARE REMOVAL;  Surgeon: Eustace Moore, MD;  Location: [facility];  Service: Neurosurgery;  Laterality:  N/A;  cervical six-seven  . POSTERIOR CERVICAL FUSION/FORAMINOTOMY N/A 09/23/2013   Procedure: CERVICALTWO TO CERVICAL SEVEN POSTERIOR CERVICAL FUSION/FORAMINOTOMY WITH LATERAL MASS FIXATION;  Surgeon: Eustace Moore, MD;  Location: [facility];  Service: Neurosurgery;  Laterality: N/A;  . Right Hand I&D  12/07   s/p rdue to abscess-Dr. Amedeo Plenty  . TOTAL HIP ARTHROPLASTY  12/17/2011   Procedure:  TOTAL HIP ARTHROPLASTY;  Surgeon: Johnny Bridge, MD;  Location: [facility];  Service: Orthopedics;  Laterality: Left;   Family History  Problem Relation Age of Onset  . Alcohol abuse Father   . Stroke Father   . Heart disease Father   . Arthritis Mother   . Hyperlipidemia Mother   . Goiter Mother        resection of benign goiter  . Hypertension Sister   . Diabetes Sister   . Goiter Sister        resection of benign goiter  . Diabetes Brother   . Alcohol abuse Brother   . Alcohol abuse Cousin   . Heart disease Other        Aunt  . Colon cancer Neg Hx    Social History   Socioeconomic History  . Marital status: Single    Spouse name: Not on file  . Number of children: 0  . Years of education: Not on file  . Highest education level: Not on file  Occupational History  . Occupation: Industries for the [employer]  . Financial resource strain: Not hard at all  . Food insecurity    Worry: Never true    Inability: Never true  . Transportation needs    Medical: No    Non-medical: No  Tobacco Use  . Smoking status: Current Every Day Smoker    Packs/day: 0.50    Years: 40.00    Pack years: 20.00    Types: Cigarettes  . Smokeless tobacco: Never Used  Substance and Sexual Activity  . Alcohol use: No    Alcohol/week: 0.0 standard drinks    Comment: hx of alcohol abuse  . Drug use: Not Currently    Types: "Crack" cocaine    Comment: history of, " clean for 24 MONTHS"  . Sexual activity: Not Currently  Lifestyle  . Physical activity    Days per week: 0 days    Minutes per session: 0 min  . Stress: Rather much  Relationships  . Social connections    Talks on phone: More than three times a week    Gets together: More than three times a week    Attends religious service: More than 4 times per year    Active member of club or organization: Yes    Attends meetings of clubs or organizations: More than 4 times per year    Relationship status: Never married  Other  Topics Concern  . Not on file  Social History Narrative  . Not on file    Outpatient Encounter Medications as of 05/03/2019  Medication Sig  . acetaminophen (TYLENOL) 500 MG tablet Take 1,000 mg by mouth every 6 (six) hours as needed for moderate pain or headache.  [name] adapalene (DIFFERIN) 0.1 % gel APPLY 1 APPLICATION TOPICALLY AT BEDTIME.  [name] atorvastatin (LIPITOR) 40 MG tablet TAKE 1 TABLET BY MOUTH ONCE DAILY AT 6PM  . benzoyl peroxide (BENZOYL PEROXIDE) 5 % external liquid Apply 1 application topically 2 (two) times daily.  . bimatoprost (LUMIGAN) 0.01 % SOLN Place 1 drop into both  eyes at bedtime.  . carboxymethylcellulose (REFRESH PLUS) 0.5 % SOLN Place 1 drop into both eyes 3 (three) times daily as needed (for clear eyes).   . clopidogrel (PLAVIX) 75 MG tablet Take 1 tablet by mouth once daily  . diphenhydrAMINE (BENADRYL) 25 mg capsule Take 50 mg by mouth every 6 (six) hours as needed for itching.   . famotidine (PEPCID) 40 MG tablet Take 1 tablet (40 mg total) by mouth 2 (two) times daily. Take every morning and at bedtime  . fluticasone (FLONASE) 50 MCG/ACT nasal spray USE 2 SPRAYS DAILY IN BOTH NOSTRILS  . gabapentin (NEURONTIN) 100 MG capsule Take 200 mg by mouth 2 (two) times daily.  . hydrochlorothiazide (MICROZIDE) 12.5 MG capsule TAKE 1 CAPSULE (12.5 MG TOTAL) DAILY BY MOUTH.  [name] HYDROcodone-acetaminophen (NORCO) 10-325 MG per tablet Take 1 tablet by mouth every 6 (six) hours as needed for moderate pain.  [name] imiquimod (ALDARA) 5 % cream Apply topically 3 (three) times a week.  [name] Ketorolac Tromethamine (SPRIX) 15.75 MG/SPRAY SOLN Place 1 spray into the nose every 6 (six) hours as needed (for pain).  . nortriptyline (PAMELOR) 25 MG capsule Take 25 mg by mouth at bedtime.  . ondansetron (ZOFRAN) 4 MG tablet TAKE 1 TABLET (4 MG TOTAL) BY MOUTH EVERY 8 (EIGHT) HOURS AS NEEDED FOR NAUSEA OR VOMITING.  [name] oxybutynin (DITROPAN) 5 MG tablet Take 5 mg by mouth at bedtime.  . pantoprazole  (PROTONIX) 40 MG tablet Take 1 tablet by mouth once daily  . sucralfate (CARAFATE) 1 g tablet Take 1 tablet (1 g total) by mouth 3 (three) times daily. Take 20 minutes after meals and at bedtime  . tamsulosin (FLOMAX) 0.4 MG CAPS capsule TAKE 1 CAPSULE (0.4 MG TOTAL) BY MOUTH TWICE DAILY  . timolol (TIMOPTIC) 0.5 % ophthalmic solution Place 1 drop into both eyes daily.  [name] tiZANidine (ZANAFLEX) 4 MG capsule TAKE 1 CAPSULE BY ORAL ROUTE 3 TIMES EVERY DAY (Patient taking differently: Take 4 mg by mouth 3 (three) times daily as needed for muscle spasms. )  . tretinoin (RETIN-A) 0.1 % cream APPLY TO AFFECTED AREA AT BEDTIME TO FACE  . zolpidem (AMBIEN) 10 MG tablet TAKE 1 TABLET AT BEDTIME AS NEEDED FOR SLEEP (Patient taking differently: Take 10 mg by mouth at bedtime as needed for sleep. )  . [DISCONTINUED] AMBULATORY NON FORMULARY MEDICATION Medication Name: GI Cocktail 90 ml-Viscous lidocaine 90 ml-Dicyclomine 10 mg/35ml 270 ml  Maalox Total ml's 450 ml Take 5-10 ml's and swallow as needed for breakthroughl.  . [DISCONTINUED] ranitidine (ZANTAC) 150 MG tablet TAKE 1 TABLET (150 MG TOTAL) 2 (TWO) TIMES DAILY BY MOUTH. (Patient not taking: Reported on 05/03/2019)   No facility-administered encounter medications on file as of 05/03/2019.        Activities of Daily Living In your present state of health, do you have any difficulty performing the following activities: 05/03/2019 10/26/2018  Hearing? N N  Vision? N Y  Difficulty concentrating or making decisions? N N  Walking or climbing stairs? N N  Dressing or bathing? N N  Doing errands, shopping? N N  Preparing Food and eating ? N -  Using the Toilet? N -  In the past six months, have you accidently leaked urine? N -  Do you have problems with loss of bowel control? N -  Managing your Medications? N -  Managing your Finances? N -  Housekeeping or managing your Housekeeping? N -  Some recent data might  be hidden    Patient Care Team: Biagio Borg, MD as PCP - Jeannette How, MD as Consulting Physician (Neurosurgery) Warden Fillers, MD as Consulting Physician (Ophthalmology)   Assessment:   This is a routine wellness examination for Tashi. Physical assessment deferred to PCP.   Exercise Activities and Dietary recommendations Current Exercise Habits: The patient does not participate in regular exercise at present, Exercise limited by: orthopedic condition(s)  Diet (meal preparation, eat out, water intake, caffeinated beverages, dairy products, fruits and vegetables): in general, a "healthy" diet      Reviewed heart healthy diet. Encouraged patient to increase daily water and healthy fluid intake.     Goals    . Patient Stated     I want to quit smoking, decrease the amount of cigarettes I smoke daily. Limit how many packs of cigarettes I buy at the same time. Think about going to [facility] smoking cessation classes.       Fall Risk Fall Risk  05/03/2019 08/21/2017 09/13/2016 08/08/2015 04/05/2014  Falls in the past year? 1 No No No No  Number falls in past yr: 1 - - - -     Depression Screen PHQ 2/9 Scores 05/03/2019 12/08/2018 02/05/2018 08/21/2017  PHQ - 2 Score 2 0 0 0  PHQ- 9 Score 4 - 0 -    Cognitive Function       Ad8 score reviewed for issues:  Issues making decisions: no  Less interest in hobbies / activities: no  Repeats questions, stories (family complaining): no  Trouble using ordinary gadgets (microwave, computer, phone):no  Forgets the month or year: no  Mismanaging finances: no  Remembering appts: no  Daily problems with thinking and/or memory: no Ad8 score is= 0  Immunization History  Administered Date(s) Administered  . Influenza Split 08/20/2012  . Influenza Whole 08/25/2008  . Influenza,inj,Quad PF,6+ Mos 07/19/2013, 07/06/2014, 08/08/2015, 08/21/2017, 09/07/2018  . Pneumococcal Polysaccharide-23 07/30/2014  . Td 09/15/2006  . Tdap 08/08/2015   Screening  Tests Health Maintenance  Topic Date Due  . INFLUENZA VACCINE  05/15/2019  . COLONOSCOPY  04/27/2020  . TETANUS/TDAP  08/07/2025  . Hepatitis C Screening  Completed  . HIV Screening  Completed      Plan:    Reviewed health maintenance screenings with patient today and relevant education, vaccines, and/or referrals were provided.   I have personally reviewed and noted the following in the patient's chart:   . Medical and social history . Use of alcohol, tobacco or illicit drugs  . Current medications and supplements . Functional ability and status . Nutritional status . Physical activity . Advanced directives . List of other physicians . Hospitalizations, surgeries, and ER visits in previous 12 months . Vitals . Screenings to include cognitive, depression, and falls . Referrals and appointments  In addition, I have reviewed and discussed with patient certain preventive protocols, quality metrics, and best practice recommendations. A written personalized care plan for preventive services as well as general preventive health recommendations were provided to patient.     Michiel Cowboy, RN  05/03/2019  Medical screening examination/treatment/procedure(s) were performed by non-physician practitioner and as supervising physician I was immediately available for consultation/collaboration. I agree with above. Cathlean Cower, MD

## 2019-05-03 NOTE — Patient Instructions (Addendum)
Continue doing brain stimulating activities (puzzles, reading, adult coloring books, staying active) to keep memory sharp.   Continue to eat heart healthy diet (full of fruits, vegetables, whole grains, lean protein, water--limit salt, fat, and sugar intake) and increase physical activity as tolerated.   [name] , Thank you for taking time to come for your Medicare Wellness Visit. I appreciate your ongoing commitment to your health goals. Please review the following plan we discussed and let me know if I can assist you in the future.   These are the goals we discussed: Goals    . Patient Stated     I want to quit smoking, decrease the amount of cigarettes I smoke daily. Limit how many packs of cigarettes I buy at the same time. Think about going to [facility] smoking cessation classes. Increase my physical activity by starting to walk daily.       This is a list of the screening recommended for you and due dates:  Health Maintenance  Topic Date Due  . Flu Shot  05/15/2019  . Colon Cancer Screening  04/27/2020  . Tetanus Vaccine  08/07/2025  .  Hepatitis C: One time screening is recommended by Center for Disease Control  (CDC) for  adults born from [birth year] through 1966.   Completed  . HIV Screening  Completed     The [place] provides information and referral services to aging adults 65+ in [place]. If there are waiting lists for community services, or if services are not available, [facility] connects clients with [facility] volunteers close to them who provide services such as respite care, wheelchair ramp construction, friendly visits, and transportation assistance. [facility]'s Call Center fields more than 350 calls each month.  AAIRS*- and SHIIP*-certified Call Center Specialists are ready to lend a compassionate ear and seek resources Monday through Friday, 9:00 am - 5:00 pm. The Call Center has met the needs of aging adults in all of [place] counties. No religious  affiliation is required; the only eligibility criterion is that clients be 65+ or older. Contact [facility] for help. *[facility] [employer] [facility]erral [facility] *Seniors' Health Insurance Information Program Need help? Call 832-230-6050 today!  [employer] Line at 803-610-1689  1-800-QUIT-NOW 419-790-1299). http://bell-fernandez.com/   Stress Stress is a normal reaction to life events. Stress is what you feel when life demands more than you are used to, or more than you think you can handle. Some stress can be useful, such as studying for a test or meeting a deadline at work. Stress that occurs too often or for too long can cause problems. It can affect your emotional health and interfere with relationships and normal daily activities. Too much stress can weaken your body's defense system (immune system) and increase your risk for physical illness. If you already have a medical problem, stress can make it worse. What are the causes? All sorts of life events can cause stress. An event that causes stress for one person may not be stressful for another person. Major life events, whether positive or negative, commonly cause stress. Examples include:  Losing a job or starting a new job.  Losing a loved one.  Moving to a new town or home.  Getting married or divorced.  Having a baby.  Injury or illness. Less obvious life events can also cause stress, especially if they occur day after day or in combination with each other. Examples include:  Working long hours.  Driving in traffic.  Caring  for children.  Being in debt.  Being in a difficult relationship. What are the signs or symptoms? Stress can cause emotional symptoms, including:  Anxiety. This is feeling worried, afraid, on edge, overwhelmed, or out of control.  Anger, including irritation or impatience.  Depression. This is feeling  sad, down, helpless, or guilty.  Trouble focusing, remembering, or making decisions. Stress can cause physical symptoms, including:  Aches and pains. These may affect your head, neck, back, stomach, or other areas of your body.  Tight muscles or a clenched jaw.  Low energy.  Trouble sleeping. Stress can cause unhealthy behaviors, including:  Eating to feel better (overeating) or skipping meals.  Working too much or putting off tasks.  Smoking, drinking alcohol, or using drugs to feel better. How is this diagnosed? Stress is diagnosed through an assessment by your health care provider. He or she may diagnose this condition based on:  Your symptoms and any stressful life events.  Your medical history.  Tests to rule out other causes of your symptoms. Depending on your condition, your health care provider may refer you to a specialist for further evaluation. How is this treated?  Stress management techniques are the recommended treatment for stress. Medicine is not typically recommended for the treatment of stress. Techniques to reduce your reaction to stressful life events include:  Stress identification. Monitor yourself for symptoms of stress and identify what causes stress for you. These skills may help you to avoid or prepare for stressful events.  Time management. Set your priorities, keep a calendar of events, and learn to say "no." Taking these actions can help you avoid making too many commitments. Techniques for coping with stress include:  Rethinking the problem. Try to think realistically about stressful events rather than ignoring them or overreacting. Try to find the positives in a stressful situation rather than focusing on the negatives.  Exercise. Physical exercise can release both physical and emotional tension. The key is to find a form of exercise that you enjoy and do it regularly.  Relaxation techniques. These relax the body and mind. The key is to find  one or more that you enjoy and use the technique(s) regularly. Examples include: ? Meditation, deep breathing, or progressive relaxation techniques. ? Yoga or tai chi. ? Biofeedback, mindfulness techniques, or journaling. ? Listening to music, being out in nature, or participating in other hobbies.  Practicing a healthy lifestyle. Eat a balanced diet, drink plenty of water, limit or avoid caffeine, and get plenty of sleep.  Having a strong support network. Spend time with family, friends, or other people you enjoy being around. Express your feelings and talk things over with someone you trust. Counseling or talk therapy with a mental health professional may be helpful if you are having trouble managing stress on your own. Follow these instructions at home: Lifestyle   Avoid drugs.  Do not use any products that contain nicotine or tobacco, such as cigarettes and e-cigarettes. If you need help quitting, ask your health care provider.  Limit alcohol intake to no more than 1 drink a day for nonpregnant women and 2 drinks a day for men. One drink equals 12 oz of beer, 5 oz of wine, or 1 oz of hard liquor.  Do not use alcohol or drugs to relax.  Eat a balanced diet that includes fresh fruits and vegetables, whole grains, lean meats, fish, eggs, and beans, and low-fat dairy. Avoid processed foods and foods high in added fat, sugar, and salt.  Exercise at least 30 minutes on 5 or more days each week.  Get 7-8 hours of sleep each night. General instructions   Practice stress management techniques as discussed with your health care provider.  Drink enough fluid to keep your urine clear or pale yellow.  Take over-the-counter and prescription medicines only as told by your health care provider.  Keep all follow-up visits as told by your health care provider. This is important. Contact a health care provider if:  Your symptoms get worse.  You have new symptoms.  You feel overwhelmed by  your problems and can no longer manage them on your own. Get help right away if:  You have thoughts of hurting yourself or others. If you ever feel like you may hurt yourself or others, or have thoughts about taking your own life, get help right away. You can go to your nearest emergency department or call:  Your local emergency services (911 in the [place]  A suicide crisis helpline, such as the [facility] at 431-506-3836. This is open 24 hours a day. Summary  Stress is a normal reaction to life events. It can cause problems if it happens too often or for too long.  Practicing stress management techniques is the best way to treat stress.  Counseling or talk therapy with a mental health professional may be helpful if you are having trouble managing stress on your own. This information is not intended to replace advice given to you by your health care provider. Make sure you discuss any questions you have with your health care provider. Document Released: 03/26/2001 Document Revised: 09/12/2017 Document Reviewed: 11/20/2016 Elsevier Patient Education  [place] and Cholesterol Restricted Eating Plan Getting too much fat and cholesterol in your diet may cause health problems. Choosing the right foods helps keep your fat and cholesterol at normal levels. This can keep you from getting certain diseases. Your doctor may recommend an eating plan that includes:  Total fat: ______% or less of total calories a day.  Saturated fat: ______% or less of total calories a day.  Cholesterol: less than _________mg a day.  Fiber: ______g a day. What are tips for following this plan? Meal planning  At meals, divide your plate into four equal parts: ? Fill one-half of your plate with vegetables and green salads. ? Fill one-fourth of your plate with whole grains. ? Fill one-fourth of your plate with low-fat (lean) protein foods.  Eat fish that is high in  omega-3 fats at least two times a week. This includes mackerel, tuna, sardines, and salmon.  Eat foods that are high in fiber, such as whole grains, beans, apples, broccoli, carrots, peas, and barley. General tips   Work with your doctor to lose weight if you need to.  Avoid: ? Foods with added sugar. ? Fried foods. ? Foods with partially hydrogenated oils.  Limit alcohol intake to no more than 1 drink a day for nonpregnant women and 2 drinks a day for men. One drink equals 12 oz of beer, 5 oz of wine, or 1 oz of hard liquor. Reading food labels  Check food labels for: ? Trans fats. ? Partially hydrogenated oils. ? Saturated fat (g) in each serving. ? Cholesterol (mg) in each serving. ? Fiber (g) in each serving.  Choose foods with healthy fats, such as: ? Monounsaturated fats. ? Polyunsaturated fats. ? Omega-3 fats.  Choose grain products that have whole grains. Look for the word "whole" as  the first word in the ingredient list. Cooking  Cook foods using low-fat methods. These include baking, boiling, grilling, and broiling.  Eat more home-cooked foods. Eat at restaurants and buffets less often.  Avoid cooking using saturated fats, such as butter, cream, palm oil, palm kernel oil, and coconut oil. Recommended foods  Fruits  All fresh, canned (in natural juice), or frozen fruits. Vegetables  Fresh or frozen vegetables (raw, steamed, roasted, or grilled). Green salads. Grains  Whole grains, such as whole wheat or whole grain breads, crackers, cereals, and pasta. Unsweetened oatmeal, bulgur, barley, quinoa, or brown rice. Corn or whole wheat flour tortillas. Meats and other protein foods  Ground beef (85% or leaner), grass-fed beef, or beef trimmed of fat. Skinless chicken or [place]. Ground chicken or [place]. Pork trimmed of fat. All fish and seafood. Egg whites. Dried beans, peas, or lentils. Unsalted nuts or seeds. Unsalted canned beans. Nut butters without added  sugar or oil. Dairy  Low-fat or nonfat dairy products, such as skim or 1% milk, 2% or reduced-fat cheeses, low-fat and fat-free ricotta or cottage cheese, or plain low-fat and nonfat yogurt. Fats and oils  Tub margarine without trans fats. Light or reduced-fat mayonnaise and salad dressings. Avocado. Olive, canola, sesame, or safflower oils. The items listed above may not be a complete list of foods and beverages you can eat. Contact a dietitian for more information. Foods to avoid Fruits  Canned fruit in heavy syrup. Fruit in cream or butter sauce. Fried fruit. Vegetables  Vegetables cooked in cheese, cream, or butter sauce. Fried vegetables. Grains  White bread. White pasta. White rice. Cornbread. Bagels, pastries, and croissants. Crackers and snack foods that contain trans fat and hydrogenated oils. Meats and other protein foods  Fatty cuts of meat. Ribs, chicken wings, bacon, sausage, bologna, salami, chitterlings, fatback, hot dogs, bratwurst, and packaged lunch meats. Liver and organ meats. Whole eggs and egg yolks. Chicken and [place] with skin. Fried meat. Dairy  Whole or 2% milk, cream, half-and-half, and cream cheese. Whole milk cheeses. Whole-fat or sweetened yogurt. Full-fat cheeses. Nondairy creamers and whipped toppings. Processed cheese, cheese spreads, and cheese curds. Beverages  Alcohol. Sugar-sweetened drinks such as sodas, lemonade, and fruit drinks. Fats and oils  Butter, stick margarine, lard, shortening, ghee, or bacon fat. Coconut, palm kernel, and palm oils. Sweets and desserts  Corn syrup, sugars, honey, and molasses. Candy. Jam and jelly. Syrup. Sweetened cereals. Cookies, pies, cakes, donuts, muffins, and ice cream. The items listed above may not be a complete list of foods and beverages you should avoid. Contact a dietitian for more information. Summary  Choosing the right foods helps keep your fat and cholesterol at normal levels. This can keep you  from getting certain diseases.  At meals, fill one-half of your plate with vegetables and green salads.  Eat high-fiber foods, like whole grains, beans, apples, carrots, peas, and barley.  Limit added sugar, saturated fats, alcohol, and fried foods. This information is not intended to replace advice given to you by your health care provider. Make sure you discuss any questions you have with your health care provider. Document Released: 03/31/2012 Document Revised: 06/03/2018 Document Reviewed: 06/17/2017 Elsevier Patient Education  2020 [employer].

## 2019-05-03 NOTE — Progress Notes (Signed)
Subjective:    Patient ID: Mark Dunlap, male    DOB: 10-05-1961, 58 y.o.   MRN: [id number]  HPI  Here for wellness and f/u;  Overall doing ok;  Pt denies Chest pain, worsening SOB, DOE, wheezing, orthopnea, PND, worsening LE edema, palpitations, dizziness or syncope.  Pt denies neurological change such as new headache, facial or extremity weakness.  Pt denies polydipsia, polyuria, or low sugar symptoms. Pt states overall good compliance with treatment and medications, good tolerability, and has been trying to follow appropriate diet.  Pt denies worsening depressive symptoms, suicidal ideation or panic. No fever, night sweats, wt loss, loss of appetite, or other constitutional symptoms.  Pt states good ability with ADL's, has low fall risk, home safety reviewed and adequate, no other significant changes in hearing or vision, and only occasionally active with exercise. Also has 52yo Mother who lives in his home and he has increased anxiety depression as he refuses her for NH, mom has good memory but difficulty with ambulation and he is constantly concerned about her falling, has multiple cameras in the home he can access by his phone.  Sister in law helps with bathing. Denies worsening suicidal ideation, or panic   Due for prostate bx July 28, had to wait for 6 mo on plavix s/p CVA. Pt still smoking, unable to quit.  Denies urinary symptoms such as dysuria, frequency, urgency, flank pain, hematuria or n/v, fever, chills. Past Medical History:  Diagnosis Date  . Acute prostatitis 05/23/2009  . Adjustment disorder with depressed mood 09/2007  . CERVICAL RADICULOPATHY, LEFT 08/30/2008  . CVA (cerebrovascular accident) ([place])   . DJD (degenerative joint disease) of hip   . DJD (degenerative joint disease) of knee    left knee  . GERD (gastroesophageal reflux disease)   . 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 H/O: substance abuse ([place])    hx of  ETOH/Crack cocaine-none since 11/08 per pt/Does not Drive due to this  . HYPERTENSION 08/30/2008  . HYPERTHYROIDISM 02/13/2010   pt was told by  Dr Jenny Reichmann  that thyroid was now back to normal .. 2012 ...   . HYPOTENSION 10/31/2009  . 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 Other and unspecified hyperlipidemia 08/20/2013  . PONV (postoperative nausea and vomiting)   . Seasonal allergies   . SPINAL STENOSIS, CERVICAL 08/08/2009  . THYROID NODULE, RIGHT 03/02/2010   Past Surgical History:  Procedure Laterality Date  . ANTERIOR CERVICAL DECOMP/DISCECTOMY FUSION  10/11/2011   Procedure: ANTERIOR CERVICAL DECOMPRESSION/DISCECTOMY FUSION 3 LEVELS;  Surgeon: Eustace Moore;  Location: [facility];  Service: Neurosurgery;  Laterality: N/A;  Cervical three-four ,cervical four five cervical five six Anterior Cervical Decompression Fusion with peek + plate Nuvasive translational plate Orthofix peek (2 1/2 hours) Rm # [id number]  . ANTERIOR CERVICAL DECOMP/DISCECTOMY FUSION N/A 07/29/2014   Procedure: ANTERIOR CERVICAL DECOMPRESSION/DISCECTOMY FUSION 1 LEVEL/HARDWARE REMOVAL;  Surgeon: Eustace Moore, MD;  Location: [facility];  Service: Neurosurgery;  Laterality: N/A;  cervical six-seven  . POSTERIOR CERVICAL FUSION/FORAMINOTOMY N/A 09/23/2013   Procedure: CERVICALTWO TO CERVICAL SEVEN POSTERIOR CERVICAL FUSION/FORAMINOTOMY WITH LATERAL MASS FIXATION;  Surgeon: Eustace Moore, MD;  Location: [facility];  Service: Neurosurgery;  Laterality: N/A;  . Right Hand I&D  12/07   s/p rdue to abscess-Dr. Amedeo Plenty  . TOTAL HIP ARTHROPLASTY  12/17/2011   Procedure: TOTAL HIP ARTHROPLASTY;  Surgeon: Johnny Bridge, MD;  Location: [facility];  Service: Orthopedics;  Laterality: Left;    reports that he has been smoking cigarettes. He has a 20.00 pack-year smoking history. He has never used smokeless tobacco. He reports previous drug use. Drug: "Crack" cocaine. He reports that he  does not drink alcohol. family history includes Alcohol abuse in his brother, cousin, and father; Arthritis in his mother; Diabetes in his brother and sister; Goiter in his mother and sister; Heart disease in his father and another family member; Hyperlipidemia in his mother; Hypertension in his sister; Stroke in his father. Allergies  Allergen Reactions  . Tramadol Itching  . Morphine And Related Itching  . Oxycodone Itching   Current Outpatient Medications on File Prior to Visit  Medication Sig Dispense Refill  . acetaminophen (TYLENOL) 500 MG tablet Take 1,000 mg by mouth every 6 (six) hours as needed for moderate pain or headache.    [name] atorvastatin (LIPITOR) 40 MG tablet TAKE 1 TABLET BY MOUTH ONCE DAILY AT 6PM 180 tablet 1  . benzoyl peroxide (BENZOYL PEROXIDE) 5 % external liquid Apply 1 application topically 2 (two) times daily. 226 g 0  . bimatoprost (LUMIGAN) 0.01 % SOLN Place 1 drop into both eyes at bedtime.    . carboxymethylcellulose (REFRESH PLUS) 0.5 % SOLN Place 1 drop into both eyes 3 (three) times daily as needed (for clear eyes).     . clopidogrel (PLAVIX) 75 MG tablet Take 1 tablet by mouth once daily 180 tablet 0  . diphenhydrAMINE (BENADRYL) 25 mg capsule Take 50 mg by mouth every 6 (six) hours as needed for itching.     . famotidine (PEPCID) 40 MG tablet Take 1 tablet (40 mg total) by mouth 2 (two) times daily. Take every morning and at bedtime 60 tablet 3  . fluticasone (FLONASE) 50 MCG/ACT nasal spray USE 2 SPRAYS DAILY IN BOTH NOSTRILS 16 mL 1  . gabapentin (NEURONTIN) 100 MG capsule Take 200 mg by mouth 2 (two) times daily.  2  . hydrochlorothiazide (MICROZIDE) 12.5 MG capsule TAKE 1 CAPSULE (12.5 MG TOTAL) DAILY BY MOUTH. 90 capsule 1  . HYDROcodone-acetaminophen (NORCO) 10-325 MG per tablet Take 1 tablet by mouth every 6 (six) hours as needed for moderate pain. 60 tablet 0  . Ketorolac Tromethamine (SPRIX) 15.75 MG/SPRAY SOLN Place 1 spray into the nose every 6  (six) hours as needed (for pain).    . nortriptyline (PAMELOR) 25 MG capsule Take 25 mg by mouth at bedtime.  0  . ondansetron (ZOFRAN) 4 MG tablet TAKE 1 TABLET (4 MG TOTAL) BY MOUTH EVERY 8 (EIGHT) HOURS AS NEEDED FOR NAUSEA OR VOMITING. 20 tablet 2  . oxybutynin (DITROPAN) 5 MG tablet Take 5 mg by mouth at bedtime.  11  . pantoprazole (PROTONIX) 40 MG tablet Take 1 tablet by mouth once daily 90 tablet 0  . sucralfate (CARAFATE) 1 g tablet Take 1 tablet (1 g total) by mouth 3 (three) times daily. Take 20 minutes after meals and at bedtime 120 tablet 1  . tamsulosin (FLOMAX) 0.4 MG CAPS capsule TAKE 1 CAPSULE (0.4 MG TOTAL) BY MOUTH TWICE DAILY 180 capsule 1  . timolol (TIMOPTIC) 0.5 % ophthalmic solution Place 1 drop into both eyes daily.  6  . tiZANidine (ZANAFLEX) 4 MG capsule TAKE 1 CAPSULE BY ORAL ROUTE 3 TIMES EVERY DAY (Patient taking differently: Take 4 mg by mouth 3 (three) times daily as  needed for muscle spasms. ) 60 capsule 2  . tretinoin (RETIN-A) 0.1 % cream APPLY TO AFFECTED AREA AT BEDTIME TO FACE 45 g 2  . zolpidem (AMBIEN) 10 MG tablet TAKE 1 TABLET AT BEDTIME AS NEEDED FOR SLEEP (Patient taking differently: Take 10 mg by mouth at bedtime as needed for sleep. ) 90 tablet 1   No current facility-administered medications on file prior to visit.    Review of Systems Constitutional: Negative for other unusual diaphoresis, sweats, appetite or weight changes HENT: Negative for other worsening hearing loss, ear pain, facial swelling, mouth sores or neck stiffness.   Eyes: Negative for other worsening pain, redness or other visual disturbance.  Respiratory: Negative for other stridor or swelling Cardiovascular: Negative for other palpitations or other chest pain  Gastrointestinal: Negative for worsening diarrhea or loose stools, blood in stool, distention or other pain Genitourinary: Negative for hematuria, flank pain or other change in urine volume.  Musculoskeletal: Negative for  myalgias or other joint swelling.  Skin: Negative for other color change, or other wound or worsening drainage.  Neurological: Negative for other syncope or numbness. Hematological: Negative for other adenopathy or swelling Psychiatric/Behavioral: Negative for hallucinations, other worsening agitation, SI, self-injury, or new decreased concentration All other system neg per pt    Objective:   Physical Exam BP (!) 143/72   Pulse 90   Temp 97.8 F (36.6 C) (Oral)   Ht 6\' 1"  (1.854 m)   Wt 165 lb (74.8 kg)   SpO2 100%   BMI 21.77 kg/m  VS noted,  Constitutional: Pt is oriented to person, place, and time. Appears well-developed and well-nourished, in no significant distress and comfortable Head: Normocephalic and atraumatic  Eyes: Conjunctivae and EOM are normal. Pupils are equal, round, and reactive to light Right Ear: External ear normal without discharge Left Ear: External ear normal without discharge Nose: Nose without discharge or deformity Mouth/Throat: Oropharynx is without other ulcerations and moist  Neck: Normal range of motion. Neck supple. No JVD present. No tracheal deviation present or significant neck LA or mass Cardiovascular: Normal rate, regular rhythm, normal heart sounds and intact distal pulses.   Pulmonary/Chest: WOB normal and breath sounds without rales or wheezing  Abdominal: Soft. Bowel sounds are normal. NT. No HSM  Musculoskeletal: Normal range of motion. Exhibits no edema Lymphadenopathy: Has no other cervical adenopathy.  Neurological: Pt is alert and oriented to person, place, and time. Pt has normal reflexes. No cranial nerve deficit. Motor grossly intact, Gait intact Skin: Skin is warm and dry. No rash noted or new ulcerations Psychiatric:  Has nervous depressed mood and affect. Behavior is normal without agitation No other exam findings Lab Results  Component Value Date   WBC 10.2 05/03/2019   HGB 14.6 05/03/2019   HCT 44.6 05/03/2019   PLT 384.0  05/03/2019   GLUCOSE 92 05/03/2019   CHOL 128 05/03/2019   TRIG 151.0 (H) 05/03/2019   HDL 48.50 05/03/2019   LDLDIRECT 100.0 09/02/2018   LDLCALC 50 05/03/2019   ALT 21 05/03/2019   AST 19 05/03/2019   NA 137 05/03/2019   K 3.7 05/03/2019   CL 98 05/03/2019   CREATININE 1.12 05/03/2019   BUN 10 05/03/2019   CO2 31 05/03/2019   TSH 1.66 05/03/2019   PSA 5.21 (H) 05/03/2019   INR 0.94 10/26/2018   HGBA1C 5.5 05/03/2019      Assessment & Plan:

## 2019-05-03 NOTE — Assessment & Plan Note (Signed)
Goal < 70, for f/u lab today

## 2019-05-04 ENCOUNTER — Encounter: Payer: Self-pay | Admitting: Internal Medicine

## 2019-05-04 LAB — TSH: TSH: 1.66 u[IU]/mL (ref 0.35–4.50)

## 2019-05-04 LAB — LIPID PANEL
Cholesterol: 128 mg/dL (ref 0–200)
HDL: 48.5 mg/dL (ref >39.00)
LDL Cholesterol: 50 mg/dL (ref 0–99)
NonHDL: 79.93
Total CHOL/HDL Ratio: 3
Triglycerides: 151 mg/dL — ABNORMAL HIGH (ref 0.0–149.0)
VLDL: 30.2 mg/dL (ref 0.0–40.0)

## 2019-05-04 LAB — BASIC METABOLIC PANEL
BUN: 10 mg/dL (ref 6–23)
CO2: 31 mEq/L (ref 19–32)
Calcium: 9.4 mg/dL (ref 8.4–10.5)
Chloride: 98 mEq/L (ref 96–112)
Creatinine, Ser: 1.12 mg/dL (ref 0.40–1.50)
GFR: 81.13 mL/min (ref >60.00)
Glucose, Bld: 92 mg/dL (ref 70–99)
Potassium: 3.7 mEq/L (ref 3.5–5.1)
Sodium: 137 mEq/L (ref 135–145)

## 2019-05-04 LAB — HEPATIC FUNCTION PANEL
ALT: 21 U/L (ref 0–53)
AST: 19 U/L (ref 0–37)
Albumin: 4.4 g/dL (ref 3.5–5.2)
Alkaline Phosphatase: 135 U/L — ABNORMAL HIGH (ref 39–117)
Bilirubin, Direct: 0.1 mg/dL (ref 0.0–0.3)
Total Bilirubin: 0.5 mg/dL (ref 0.2–1.2)
Total Protein: 7.3 g/dL (ref 6.0–8.3)

## 2019-05-04 LAB — IBC PANEL
Iron: 69 ug/dL (ref 42–165)
Saturation Ratios: 18.3 % — ABNORMAL LOW (ref 20.0–50.0)
Transferrin: 270 mg/dL (ref 212.0–360.0)

## 2019-05-04 LAB — VITAMIN D 25 HYDROXY (VIT D DEFICIENCY, FRACTURES): VITD: 37.25 ng/mL (ref 30.00–100.00)

## 2019-05-04 LAB — PSA: PSA: 5.21 ng/mL — ABNORMAL HIGH (ref 0.10–4.00)

## 2019-05-04 LAB — VITAMIN B12: Vitamin B-12: 369 pg/mL (ref 211–911)

## 2019-05-09 ENCOUNTER — Encounter: Payer: Self-pay | Admitting: Internal Medicine

## 2019-05-09 NOTE — Assessment & Plan Note (Addendum)
mild to mod, no Si or HI, for celexa 20 qd, declines counseling referral or psychiatry referral  In addition to the time spent performing CPE, I spent an additional 25 minutes face to face,in which greater than 50% of this time was spent in counseling and coordination of care for patient's acute illness as documented, including the differential dx, treatment, further evaluation and other management of  Anxiety, depression, HTN, HLD, renal insufficiency, smoking

## 2019-05-09 NOTE — Assessment & Plan Note (Signed)
stable overall by history and exam, recent data reviewed with pt, and pt to continue medical treatment as before,  to f/u any worsening symptoms or concerns  

## 2019-05-09 NOTE — Assessment & Plan Note (Signed)
Asympt, for f/u psa with labs

## 2019-05-09 NOTE — Assessment & Plan Note (Signed)
Urged to quit,  to f/u any worsening symptoms or concerns  

## 2019-05-21 ENCOUNTER — Other Ambulatory Visit: Payer: Self-pay | Admitting: Physician Assistant

## 2019-05-25 ENCOUNTER — Telehealth: Payer: Self-pay | Admitting: Physician Assistant

## 2019-05-25 NOTE — Telephone Encounter (Signed)
Pt requested a refill on "a cocktail."

## 2019-05-26 ENCOUNTER — Other Ambulatory Visit: Payer: Self-pay | Admitting: Internal Medicine

## 2019-05-26 NOTE — Telephone Encounter (Signed)
Done erx 

## 2019-05-27 ENCOUNTER — Other Ambulatory Visit: Payer: Self-pay

## 2019-05-27 MED ORDER — AMBULATORY NON FORMULARY MEDICATION: 1 refills | Status: DC

## 2019-05-27 NOTE — Telephone Encounter (Signed)
Patient requesting refill for GI Cocktail states he is having a lot of discomfort.  He states he had a stroke back in January and not able to have EGD for 6 months.  Amy Esterwood, PA approved refill x one but states patient needs a follow up.  I called patient and left a voicemail stating prescription has been faxed to [facility] with NO refills.  He will need to make an appointment.  Scanned RX in chart

## 2019-06-01 ENCOUNTER — Other Ambulatory Visit: Payer: Self-pay | Admitting: Internal Medicine

## 2019-06-08 ENCOUNTER — Other Ambulatory Visit: Payer: Self-pay

## 2019-06-08 ENCOUNTER — Encounter: Payer: Self-pay | Admitting: Adult Health

## 2019-06-08 ENCOUNTER — Ambulatory Visit (INDEPENDENT_AMBULATORY_CARE_PROVIDER_SITE_OTHER): Payer: Medicare Other | Admitting: Adult Health

## 2019-06-08 VITALS — BP 127/82 | HR 86 | Temp 98.2°F | Ht 73.0 in | Wt 168.4 lb

## 2019-06-08 DIAGNOSIS — Z720 Tobacco use: Secondary | ICD-10-CM | POA: Diagnosis not present

## 2019-06-08 DIAGNOSIS — I10 Essential (primary) hypertension: Secondary | ICD-10-CM

## 2019-06-08 DIAGNOSIS — E785 Hyperlipidemia, unspecified: Secondary | ICD-10-CM

## 2019-06-08 DIAGNOSIS — I639 Cerebral infarction, unspecified: Secondary | ICD-10-CM | POA: Diagnosis not present

## 2019-06-08 DIAGNOSIS — F329 Major depressive disorder, single episode, unspecified: Secondary | ICD-10-CM

## 2019-06-08 NOTE — Patient Instructions (Addendum)
It would be highly recommended for you to start Celexa as this can improve potential underlying depression and anxiety  Important to perform stress reduction techniques  Continue clopidogrel 75 mg daily  and Lipitor for secondary stroke prevention  Continue to follow up with PCP regarding cholesterol and blood pressure management   Continue to monitor blood pressure at home  Maintain strict control of hypertension with blood pressure goal below 130/90, diabetes with hemoglobin A1c goal below 6.5% and cholesterol with LDL cholesterol (bad cholesterol) goal below 70 mg/dL. I also advised the patient to eat a healthy diet with plenty of whole grains, cereals, fruits and vegetables, exercise regularly and maintain ideal body weight.  Follow-up as needed       Thank you for coming to see [place] at [facility]. I hope we have been able to provide you high quality care today.  You may receive a patient satisfaction survey over the next few weeks. We would appreciate your feedback and comments so that we may continue to improve ourselves and the health of our patients.   Mindfulness-Based Stress Reduction Mindfulness-based stress reduction (MBSR) is a program that helps people learn to practice mindfulness. Mindfulness is the practice of intentionally paying attention to the present moment. It can be learned and practiced through techniques such as education, breathing exercises, meditation, and yoga. MBSR includes several mindfulness techniques in one program. MBSR works best when you understand the treatment, are willing to try new things, and can commit to spending time practicing what you learn. MBSR training may include learning about:  How your emotions, thoughts, and reactions affect your body.  New ways to respond to things that cause negative thoughts to start (triggers).  How to notice your thoughts and let go of them.  Practicing awareness of everyday things that  you normally do without thinking.  The techniques and goals of different types of meditation. What are the benefits of MBSR? MBSR can have many benefits, which include helping you to:  Develop self-awareness. This refers to knowing and understanding yourself.  Learn skills and attitudes that help you to participate in your own health care.  Learn new ways to care for yourself.  Be more accepting about how things are, and let things go.  Be less judgmental and approach things with an open mind.  Be patient with yourself and trust yourself more. MBSR has also been shown to:  Reduce negative emotions, such as depression and anxiety.  Improve memory and focus.  Change how you sense and approach pain.  Boost your body's ability to fight infections.  Help you connect better with other people.  Improve your sense of well-being. Follow these instructions at home:   Find a local in-person or online MBSR program.  Set aside some time regularly for mindfulness practice.  Find a mindfulness practice that works best for you. This may include one or more of the following: ? Meditation. Meditation involves focusing your mind on a certain thought or activity. ? Breathing awareness exercises. These help you to stay present by focusing on your breath. ? Body scan. For this practice, you lie down and pay attention to each part of your body from head to toe. You can identify tension and soreness and intentionally relax parts of your body. ? Yoga. Yoga involves stretching and breathing, and it can improve your ability to move and be flexible. It can also provide an experience of testing your body's limits, which can help you release stress. ?  Mindful eating. This way of eating involves focusing on the taste, texture, color, and smell of each bite of food. Because this slows down eating and helps you feel full sooner, it can be an important part of a weight-loss plan.  Find a podcast or  recording that provides guidance for breathing awareness, body scan, or meditation exercises. You can listen to these any time when you have a free moment to rest without distractions.  Follow your treatment plan as told by your health care provider. This may include taking regular medicines and making changes to your diet or lifestyle as recommended. How to practice mindfulness To do a basic awareness exercise:  Find a comfortable place to sit.  Pay attention to the present moment. Observe your thoughts, feelings, and surroundings just as they are.  Avoid placing judgment on yourself, your feelings, or your surroundings. Make note of any judgment that comes up, and let it go.  Your mind may wander, and that is okay. Make note of when your thoughts drift, and return your attention to the present moment. To do basic mindfulness meditation:  Find a comfortable place to sit. This may include a stable chair or a firm floor cushion. ? Sit upright with your back straight. Let your arms fall next to your side with your hands resting on your legs. ? If sitting in a chair, rest your feet flat on the floor. ? If sitting on a cushion, cross your legs in front of you.  Keep your head in a neutral position with your chin dropped slightly. Relax your jaw and rest the tip of your tongue on the roof of your mouth. Drop your gaze to the floor. You can close your eyes if you like.  Breathe normally and pay attention to your breath. Feel the air moving in and out of your nose. Feel your belly expanding and relaxing with each breath.  Your mind may wander, and that is okay. Make note of when your thoughts drift, and return your attention to your breath.  Avoid placing judgment on yourself, your feelings, or your surroundings. Make note of any judgment or feelings that come up, let them go, and bring your attention back to your breath.  When you are ready, lift your gaze or open your eyes. Pay attention to  how your body feels after the meditation. Where to find more information You can find more information about MBSR from:  Your health care provider.  Community-based meditation centers or programs.  Programs offered near you. Summary  Mindfulness-based stress reduction (MBSR) is a program that teaches you how to intentionally pay attention to the present moment. It is used with other treatments to help you cope better with daily stress, emotions, and pain.  MBSR focuses on developing self-awareness, which allows you to respond to life stress without judgment or negative emotions.  MBSR programs may involve learning different mindfulness practices, such as breathing exercises, meditation, yoga, body scan, or mindful eating. Find a mindfulness practice that works best for you, and set aside time for it on a regular basis. This information is not intended to replace advice given to you by your health care provider. Make sure you discuss any questions you have with your health care provider. Document Released: 02/06/2017 Document Revised: 09/12/2017 Document Reviewed: 02/06/2017 Elsevier Patient Education  2020 [employer].

## 2019-06-08 NOTE — Progress Notes (Signed)
[facility] [address] [place]. [place] [address] [phone]       OFFICE FOLLOW UP NOTE  Mark Dunlap Date of Birth:  [birth year] Medical Record Number:  [id number]   Reason for visit: Right BG/CR infarct  CHIEF COMPLAINT:  Chief Complaint  Patient presents with  . Follow-up    6 mon f/u. Alone. Treatment room. No new concerns at this time.     HPI:  Stroke admission 10/26/2018: Mark Dunlap is a 58 y.o. 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  CT head reviewed and was negative for acute infarct but did show right greater than left old BG lacunes and small vessel disease.  CTA head and neck negative for emergent LVO.  MRI head reviewed and showed right BG/CR infarct with moderate small vessel disease.  2D echo normal.  Recommended DAPT for 3 weeks then Plavix alone as he was on aspirin PTA.  HTN stable and recommended long-term BP goal normotensive range.  LDL 104 and initiated atorvastatin 40 mg daily.  Current tobacco use with smoking cessation counseling provided.  All symptoms resolved prior to discharge therefore discharged home in stable condition.  12/07/2018 initial visit: He is being seen today for hospital discharge follow-up and overall has been stable from a stroke standpoint.  He denies residual deficits or recurring of symptoms.  He has returned back to all prior activities including driving and working without difficulties.  He continues on both aspirin and plavix without bleeding or bruising.  Continues on atorvastatin without side effects myalgias.  Blood pressure today satisfactory at 136/89.  He does endorse improving his diet and eating healthier. Tobacco use with approx 10 cig per day with prior use over 1 pack per day. He does have nicotine patches and is aware of need of quitting.  Denies new or worsening stroke/TIA symptoms.   06/08/2019 update: Mark Dunlap is a 58 year old male who is being seen today for stroke follow-up.  He has been stable from a stroke standpoint without residual deficits or reoccurring of symptoms.  He has experienced mild short-term memory loss such as misplacing items or forgetting items at work.  He does endorse increased stress as he cares for his elderly mother along with potential untreated depression.  PCP recommended initiating Celexa but patient has not started yet as he read potential side effects of suicide ideation.  Continues on clopidogrel and atorvastatin for secondary stroke prevention without side effects.  Blood pressure today 127/82.  Ongoing tobacco use with continuing to decrease amount but feels as though he will not be able to completely quit at this time due to increased stress.  No further concerns at this time.    ROS:   14 system review of systems performed and negative with exception of see HPI  PMH:  Past Medical History:  Diagnosis Date  . Acute prostatitis 05/23/2009  . Adjustment disorder with depressed mood 09/2007  . CERVICAL RADICULOPATHY, LEFT 08/30/2008  . CVA (cerebrovascular accident) ([place])   . DJD (degenerative joint disease) of hip   . DJD (degenerative joint disease) of knee    left knee  . GERD (gastroesophageal reflux disease)   . 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 H/O: substance abuse ([place])    hx of ETOH/Crack cocaine-none since 11/08 per pt/Does not Drive due  to this  . HYPERTENSION 08/30/2008  . HYPERTHYROIDISM 02/13/2010   pt was told by  Dr Jenny Reichmann  that thyroid was now back to normal .. 2012 ...   . HYPOTENSION 10/31/2009  . 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 Other and unspecified hyperlipidemia 08/20/2013  . PONV (postoperative nausea and vomiting)   . Seasonal allergies   . SPINAL STENOSIS, CERVICAL 08/08/2009  . THYROID NODULE,  RIGHT 03/02/2010    PSH:  Past Surgical History:  Procedure Laterality Date  . ANTERIOR CERVICAL DECOMP/DISCECTOMY FUSION  10/11/2011   Procedure: ANTERIOR CERVICAL DECOMPRESSION/DISCECTOMY FUSION 3 LEVELS;  Surgeon: Eustace Moore;  Location: [facility];  Service: Neurosurgery;  Laterality: N/A;  Cervical three-four ,cervical four five cervical five six Anterior Cervical Decompression Fusion with peek + plate Nuvasive translational plate Orthofix peek (2 1/2 hours) Rm # [id number]  . ANTERIOR CERVICAL DECOMP/DISCECTOMY FUSION N/A 07/29/2014   Procedure: ANTERIOR CERVICAL DECOMPRESSION/DISCECTOMY FUSION 1 LEVEL/HARDWARE REMOVAL;  Surgeon: Eustace Moore, MD;  Location: [facility];  Service: Neurosurgery;  Laterality: N/A;  cervical six-seven  . POSTERIOR CERVICAL FUSION/FORAMINOTOMY N/A 09/23/2013   Procedure: CERVICALTWO TO CERVICAL SEVEN POSTERIOR CERVICAL FUSION/FORAMINOTOMY WITH LATERAL MASS FIXATION;  Surgeon: Eustace Moore, MD;  Location: [facility];  Service: Neurosurgery;  Laterality: N/A;  . Right Hand I&D  12/07   s/p rdue to abscess-Dr. Amedeo Plenty  . TOTAL HIP ARTHROPLASTY  12/17/2011   Procedure: TOTAL HIP ARTHROPLASTY;  Surgeon: Johnny Bridge, MD;  Location: [facility];  Service: Orthopedics;  Laterality: Left;    Social History:  Social History   Socioeconomic History  . Marital status: Single    Spouse name: Not on file  . Number of children: 0  . Years of education: Not on file  . Highest education level: Not on file  Occupational History  . Occupation: Industries for the [employer]  . Financial resource strain: Not hard at all  . Food insecurity    Worry: Never true    Inability: Never true  . Transportation needs    Medical: No    Non-medical: No  Tobacco Use  . Smoking status: Current Every Day Smoker    Packs/day: 0.50    Years: 40.00    Pack years: 20.00    Types: Cigarettes  . Smokeless tobacco: Never Used  Substance and Sexual Activity  . Alcohol  use: No    Alcohol/week: 0.0 standard drinks    Comment: hx of alcohol abuse  . Drug use: Not Currently    Types: "Crack" cocaine    Comment: history of, " clean for 24 MONTHS"  . Sexual activity: Not Currently  Lifestyle  . Physical activity    Days per week: 0 days    Minutes per session: 0 min  . Stress: Rather much  Relationships  . Social connections    Talks on phone: More than three times a week    Gets together: More than three times a week    Attends religious service: More than 4 times per year    Active member of club or organization: Yes    Attends meetings of clubs or organizations: More than 4 times per year    Relationship status: Never married  . Intimate partner violence    Fear of current or ex partner: Not on file    Emotionally abused: Not on file  Physically abused: Not on file    Forced sexual activity: Not on file  Other Topics Concern  . Not on file  Social History Narrative  . Not on file    Family History:  Family History  Problem Relation Age of Onset  . Alcohol abuse Father   . Stroke Father   . Heart disease Father   . Arthritis Mother   . Hyperlipidemia Mother   . Goiter Mother        resection of benign goiter  . Hypertension Sister   . Diabetes Sister   . Goiter Sister        resection of benign goiter  . Diabetes Brother   . Alcohol abuse Brother   . Alcohol abuse Cousin   . Heart disease Other        Aunt  . Colon cancer Neg Hx     Medications:   Current Outpatient Medications on File Prior to Visit  Medication Sig Dispense Refill  . acetaminophen (TYLENOL) 500 MG tablet Take 1,000 mg by mouth every 6 (six) hours as needed for moderate pain or headache.    [name] adapalene (DIFFERIN) 0.1 % gel Apply 1 application topically at bedtime. 45 g 1  . AMBULATORY NON FORMULARY MEDICATION Medication Name: GI Cocktail 90 ml-Viscous lidocaine 90 ml-Dicyclomine 10 mg/59ml 270 ml  Maalox Total ml's 450 ml Take 5-10 ml's and swallow as  needed for breakthroughl. 450 mL 1  . atorvastatin (LIPITOR) 40 MG tablet TAKE 1 TABLET BY MOUTH ONCE DAILY AT 6PM 180 tablet 1  . benzoyl peroxide (BENZOYL PEROXIDE) 5 % external liquid Apply 1 application topically 2 (two) times daily. 226 g 0  . bimatoprost (LUMIGAN) 0.01 % SOLN Place 1 drop into both eyes at bedtime.    . carboxymethylcellulose (REFRESH PLUS) 0.5 % SOLN Place 1 drop into both eyes 3 (three) times daily as needed (for clear eyes).     . citalopram (CELEXA) 20 MG tablet Take 1 tablet (20 mg total) by mouth daily. 90 tablet 3  . clopidogrel (PLAVIX) 75 MG tablet Take 1 tablet by mouth once daily 180 tablet 0  . diphenhydrAMINE (BENADRYL) 25 mg capsule Take 50 mg by mouth every 6 (six) hours as needed for itching.     . famotidine (PEPCID) 40 MG tablet Take 1 tablet (40 mg total) by mouth 2 (two) times daily. Take every morning and at bedtime 60 tablet 3  . fluticasone (FLONASE) 50 MCG/ACT nasal spray USE 2 SPRAYS DAILY IN BOTH NOSTRILS (Patient taking differently: Place 1 spray into both nostrils as needed. ) 16 mL 1  . gabapentin (NEURONTIN) 100 MG capsule Take 200 mg by mouth 2 (two) times daily.  2  . hydrochlorothiazide (MICROZIDE) 12.5 MG capsule TAKE 1 CAPSULE (12.5 MG TOTAL) DAILY BY MOUTH. 90 capsule 1  . HYDROcodone-acetaminophen (NORCO) 10-325 MG per tablet Take 1 tablet by mouth every 6 (six) hours as needed for moderate pain. 60 tablet 0  . imiquimod (ALDARA) 5 % cream Apply topically 3 (three) times a week. 24 each 2  . Ketorolac Tromethamine (SPRIX) 15.75 MG/SPRAY SOLN Place 1 spray into the nose every 6 (six) hours as needed (for pain).    . nortriptyline (PAMELOR) 25 MG capsule Take 25 mg by mouth at bedtime.  0  . ondansetron (ZOFRAN) 4 MG tablet TAKE 1 TABLET (4 MG TOTAL) BY MOUTH EVERY 8 (EIGHT) HOURS AS NEEDED FOR NAUSEA OR VOMITING. 20 tablet 2  . oxybutynin (DITROPAN) 5  MG tablet Take 5 mg by mouth at bedtime.  11  . pantoprazole (PROTONIX) 40 MG tablet Take  1 tablet by mouth once daily 90 tablet 0  . sucralfate (CARAFATE) 1 g tablet Take 1 tablet (1 g total) by mouth 3 (three) times daily. Take 20 minutes after meals and at bedtime 120 tablet 1  . tamsulosin (FLOMAX) 0.4 MG CAPS capsule TAKE 1 CAPSULE (0.4 MG TOTAL) BY MOUTH TWICE DAILY 180 capsule 1  . timolol (TIMOPTIC) 0.5 % ophthalmic solution Place 1 drop into both eyes daily.  6  . tiZANidine (ZANAFLEX) 4 MG capsule TAKE 1 CAPSULE BY ORAL ROUTE 3 TIMES EVERY DAY (Patient taking differently: Take 4 mg by mouth 3 (three) times daily as needed for muscle spasms. ) 60 capsule 2  . tretinoin (RETIN-A) 0.1 % cream APPLY  CREAM TOPICALLY TO AFFECTED AREA OF FACE AT BEDTIME 40 g 0  . zolpidem (AMBIEN) 10 MG tablet TAKE 1 TABLET BY MOUTH AT BEDTIME AS NEEDED FOR SLEEP 90 tablet 1   No current facility-administered medications on file prior to visit.     Allergies:   Allergies  Allergen Reactions  . Tramadol Itching  . Morphine And Related Itching  . Oxycodone Itching     Physical Exam  Vitals:   06/08/19 1334  BP: 127/82  Pulse: 86  Temp: 98.2 F (36.8 C)  TempSrc: Oral  Weight: 168 lb 6.4 oz (76.4 kg)  Height: 6\' 1"  (1.854 m)   Body mass index is 22.22 kg/m. No exam data present  General: well developed, well nourished, pleasant middle-aged African-American male, seated, in no evident distress Head: head normocephalic and atraumatic.   Neck: supple with no carotid or supraclavicular bruits Cardiovascular: regular rate and rhythm, no murmurs Musculoskeletal: no deformity Skin:  no rash/petichiae Vascular:  Normal pulses all extremities  Neurologic Exam Mental Status: Awake and fully alert. Oriented to place and time. Recent and remote memory intact. Attention span, concentration and fund of knowledge appropriate. Mood and affect appropriate.  Cranial Nerves: Fundoscopic exam reveals sharp disc margins. Pupils equal, briskly reactive to light. Extraocular movements full without  nystagmus. Visual fields full to confrontation. Hearing intact. Facial sensation intact. Face, tongue, palate moves normally and symmetrically.  Motor: Normal bulk and tone. Normal strength in all tested extremity muscles. Sensory.: intact to touch , pinprick , position and vibratory sensation.  Coordination: Rapid alternating movements normal in all extremities. Finger-to-nose and heel-to-shin performed accurately bilaterally. Gait and Station: Arises from chair without difficulty. Stance is normal. Gait demonstrates normal stride length and balance. Able to heel, toe and tandem walk without difficulty.  Reflexes: 1+ and symmetric. Toes downgoing.      Diagnostic Data (Labs, Imaging, Testing)  CT HEAD WO CONTRAST 10/26/2018 IMPRESSION: 1. No large acute stroke, hemorrhage, or mass effect. 2. ASPECTS is 10 3. Multiple chronic lacunar infarcts in the right greater than left basal ganglia and age advanced chronic microvascular ischemic changes of the brain. A small acute infarct may be obscured given advanced chronic changes. No prior study for comparison.  CT ANGIO HEAD W OR WO CONTRAST CT ANGIO NECK W OR WO CONTRAST CT CEREBRAL PERFUSION W CONTRAST 10/26/2018 IMPRESSION: 1. No emergent large vessel occlusion. No infarct or ischemia by CT perfusion. 2. Cervical and intracranial atherosclerosis without proximal flow limiting stenosis. 3. Suboptimal arterial bolus density.  MR BRAIN WO CONTRAST 10/26/2018 IMPRESSION: Acute infarction affecting the right basal ganglia corpus striatum. Minimal petechial blood products but no frank  hematoma. No mass effect. Moderate pre-existing small-vessel ischemic changes elsewhere throughout the brain.  ECHOCARDIOGRAM 10/26/2018 Study Conclusions - Left ventricle: The cavity size was normal. Wall thickness was   increased in a pattern of mild LVH. Systolic function was normal.   The estimated ejection fraction was in the range of 60% to 65%.    Wall motion was normal; there were no regional wall motion   abnormalities. Left ventricular diastolic function parameters   were normal. - Left atrium: The atrium was normal in size. - Inferior vena cava: The vessel was normal in size. The   respirophasic diameter changes were in the normal range (>= 50%),   consistent with normal central venous pressure.    ASSESSMENT: Mark Dunlap is a 58 y.o. year old male here with right BG/CR infarct on 10/26/2018 secondary to small vessel disease. Vascular risk factors include HTN, HLD, tobacco use and prior substance abuse.  Recovered well from a stroke standpoint without residual deficits.  Concerns today of short-term memory loss such as misplacing items but does endorse increased stress with caring for his elderly mother.    PLAN:  1. Right BG/CR infarct: Continue clopidogrel 75 mg daily  and atorvastatin 40 mg for secondary stroke prevention.  Advised to discontinue aspirin at this time as 3-week DAPT completed.  Maintain strict control of hypertension with blood pressure goal below 130/90, diabetes with hemoglobin A1c goal below 6.5% and cholesterol with LDL cholesterol (bad cholesterol) goal below 70 mg/dL.  I also advised the patient to eat a healthy diet with plenty of whole grains, cereals, fruits and vegetables, exercise regularly with at least 30 minutes of continuous activity daily and maintain ideal body weight. 2. HTN: Advised to continue current treatment regimen.  Today's BP 136/89.  Advised to continue to monitor at home along with continued follow-up with PCP for management 3. HLD: Advised to continue current treatment regimen along with continued follow-up with PCP for future prescribing and monitoring of lipid panel 4. Tobacco use: Highly encouraged complete cessation from tobacco use 5. Short-term memory complaints: Likely secondary to increased stress with underlying untreated depression.  Highly encouraged initiating Celexa as  suicide ideation is typically seen in children, adolescence and young adults.  He was agreeable to initiate Celexa.  He declines further depression intervention with counseling.  We also discussed importance of stress reduction techniques.   Overall stable from stroke standpoint recommend follow-up as needed   Greater than 50% of time during this 25 minute visit was spent on counseling, discussion regarding short-term memory concerns likely related to increased stress, explanation of diagnosis of BG/CR infarct, reviewing risk factor management of HTN, HLD and tobacco use, planning of further management along with potential future management, and discussion with patient answering all questions.    Venancio Poisson, AGNP-BC  [facility] [address] [place], [place] [address]  Phone [phone] Fax [phone] Note: This document was prepared with digital dictation and possible smart phrase technology. Any transcriptional errors that result from this process are unintentional.

## 2019-06-09 NOTE — Progress Notes (Signed)
I agree with the above plan 

## 2019-06-14 ENCOUNTER — Ambulatory Visit (INDEPENDENT_AMBULATORY_CARE_PROVIDER_SITE_OTHER): Payer: Medicare Other | Admitting: Physician Assistant

## 2019-06-14 ENCOUNTER — Telehealth: Payer: Self-pay | Admitting: Emergency Medicine

## 2019-06-14 ENCOUNTER — Encounter: Payer: Self-pay | Admitting: Physician Assistant

## 2019-06-14 VITALS — BP 128/82 | HR 76 | Temp 98.1°F | Ht 71.65 in | Wt 164.1 lb

## 2019-06-14 DIAGNOSIS — Z7901 Long term (current) use of anticoagulants: Secondary | ICD-10-CM

## 2019-06-14 DIAGNOSIS — K219 Gastro-esophageal reflux disease without esophagitis: Secondary | ICD-10-CM

## 2019-06-14 DIAGNOSIS — R1013 Epigastric pain: Secondary | ICD-10-CM

## 2019-06-14 MED ORDER — AMBULATORY NON FORMULARY MEDICATION: 2 refills | Status: DC

## 2019-06-14 NOTE — Telephone Encounter (Signed)
   Mark Dunlap [birth year] [id number]  Dear Dr. Leonie Man:  We have scheduled the above named patient for a(n) endoscopy procedure. Our records show that he is on anticoagulation therapy.  Please advise as to whether the patient may come off their therapy of Plavix 5 days prior to their procedure which is scheduled for 07-01-2019.  Please route your response to Tinnie Gens, CMA or fax response to [phone].  Sincerely,    [facility]

## 2019-06-14 NOTE — Telephone Encounter (Signed)
Spoke with patient and informed him to hold Plavix 5 days prior to procedure, he will take his last dose on 06-25-2019 and resume after the procedure as directed at discharge. He verbalized understanding and will call back if he has any questions.

## 2019-06-14 NOTE — Progress Notes (Signed)
Chief Complaint: Follow-up GERD  HPI:    Mark Dunlap is a 57 year old African-American male with a past medical history as listed below, known to Dr. Fuller Plan, who returns to clinic today for follow-up of his reflux.    12/07/2018 had follow-up with neurology.  Continued on Plavix.  Aspirin stopped.  Told to wait 6 months in order to hold his blood thinner for EGD.    12/23/2018 office visit with me for follow-up of reflux.  At that time, patient continued with epigastric discomfort and some reflux symptoms.  He was on Zantac 150 mg every morning and night until the week before when he read about possible cancer side effects and stopped the medicine.  He was using Pantoprazole 40 mg before breakfast and Nexium 40 mg before dinner as his insurance would not pay for either of the medications twice daily.  He reminded me he had a recent stroke 10/31/2018 and started on Plavix, for this reason he could not pursue an EGD for at least 6 months.  Reported GI cocktail helped with his symptoms.  Prescribed Pepcid 40 mg twice daily, continued on Pantoprazole daily and Nexium daily.  Refilled GI cocktail.  Told to wait another 4 months before EGD.  Prescribed Carafate 1 g 4 times daily.  Told to follow in clinic in 4 to 5 months to arrange EGD off of his Plavix for 5 days.    06/08/2019 follow-up with neurology, Claris Gower, NP.  Continued on Plavix.    Today, the patient tells me that he has been taking over-the-counter Nexium 20 mg every morning and Pantoprazole 40 mg nightly as well as Pepcid 40 mg twice daily and GI cocktail intermittently for his heartburn and reflux symptoms with epigastric pain.  The symptoms have remained the same and are typically constant since being seen last in clinic.  Tried using the Carafate which only sometimes helps sometimes and he "cannot remember to take it that much".  Tells me he recently saw neurology who have signed off on him.  He believes it will be okay to stop his Plavix for  a procedure at this time.    Denies fever, chills, weight loss, nausea or vomiting.  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 2012 ...    HYPOTENSION 10/31/2009   Legally blind in left eye, as defined in [place]    Blind Left eye, small amt vision Right eye   Other and unspecified hyperlipidemia 08/20/2013   PONV (postoperative nausea and vomiting)    Seasonal allergies    SPINAL STENOSIS, CERVICAL 08/08/2009   THYROID NODULE, RIGHT 03/02/2010    Past Surgical History:  Procedure Laterality Date   ANTERIOR CERVICAL DECOMP/DISCECTOMY FUSION  10/11/2011   Procedure: ANTERIOR CERVICAL DECOMPRESSION/DISCECTOMY FUSION 3 LEVELS;  Surgeon: Eustace Moore;  Location: [place]  NEURO ORS;  Service: Neurosurgery;  Laterality: N/A;  Cervical three-four ,cervical four five cervical five six Anterior Cervical Decompression Fusion with peek + plate Nuvasive translational plate Orthofix peek (2 1/2 hours) Rm # [id number]   ANTERIOR CERVICAL DECOMP/DISCECTOMY FUSION N/A 07/29/2014   Procedure: ANTERIOR CERVICAL DECOMPRESSION/DISCECTOMY FUSION 1 LEVEL/HARDWARE REMOVAL;  Surgeon: Eustace Moore, MD;  Location: [facility] [facility] NEURO ORS;  Service: Neurosurgery;  Laterality: N/A;  cervical six-seven   POSTERIOR CERVICAL FUSION/FORAMINOTOMY N/A 09/23/2013   Procedure: CERVICALTWO TO CERVICAL SEVEN POSTERIOR  CERVICAL FUSION/FORAMINOTOMY WITH LATERAL MASS FIXATION;  Surgeon: Eustace Moore, MD;  Location: [place] NEURO ORS;  Service: Neurosurgery;  Laterality: N/A;   Right Hand I&D  12/07   s/p rdue to abscess-Dr. Amedeo Plenty   TOTAL HIP ARTHROPLASTY  12/17/2011   Procedure: TOTAL HIP ARTHROPLASTY;  Surgeon: Johnny Bridge, MD;  Location: [facility];  Service: Orthopedics;  Laterality: Left;    Current Outpatient Medications  Medication Sig Dispense Refill   acetaminophen (TYLENOL) 500 MG tablet Take 1,000 mg by mouth every 6 (six) hours as needed for moderate pain or headache.     adapalene (DIFFERIN) 0.1 % gel Apply 1 application topically at bedtime. 45 g 1   AMBULATORY NON FORMULARY MEDICATION Medication Name: GI Cocktail 90 ml-Viscous lidocaine 90 ml-Dicyclomine 10 mg/98ml 270 ml  Maalox Total ml's 450 ml Take 5-10 ml's and swallow as needed for breakthroughl. 450 mL 1   atorvastatin (LIPITOR) 40 MG tablet TAKE 1 TABLET BY MOUTH ONCE DAILY AT 6PM 180 tablet 1   benzoyl peroxide (BENZOYL PEROXIDE) 5 % external liquid Apply 1 application topically 2 (two) times daily. 226 g 0   bimatoprost (LUMIGAN) 0.01 % SOLN Place 1 drop into both eyes at bedtime.     carboxymethylcellulose (REFRESH PLUS) 0.5 % SOLN Place 1 drop into both eyes 3 (three) times daily as needed (for clear eyes).      citalopram (CELEXA) 20 MG tablet Take 1 tablet (20 mg total) by mouth daily. 90 tablet 3   clopidogrel (PLAVIX) 75 MG tablet Take 1 tablet by mouth once daily 180 tablet 0   diphenhydrAMINE (BENADRYL) 25 mg capsule Take 50 mg by mouth every 6 (six) hours as needed for itching.      famotidine (PEPCID) 40 MG tablet Take 1 tablet (40 mg total) by mouth 2 (two) times daily. Take every morning and at bedtime 60 tablet 3   fluticasone (FLONASE) 50 MCG/ACT nasal spray USE 2 SPRAYS DAILY IN BOTH NOSTRILS (Patient taking differently: Place 1 spray into both nostrils as needed. ) 16 mL 1   gabapentin (NEURONTIN) 100 MG  capsule Take 200 mg by mouth 2 (two) times daily.  2   hydrochlorothiazide (MICROZIDE) 12.5 MG capsule TAKE 1 CAPSULE (12.5 MG TOTAL) DAILY BY MOUTH. 90 capsule 1   HYDROcodone-acetaminophen (NORCO) 10-325 MG per tablet Take 1 tablet by mouth every 6 (six) hours as needed for moderate pain. 60 tablet 0   imiquimod (ALDARA) 5 % cream Apply topically 3 (three) times a week. 24 each 2   Ketorolac Tromethamine (SPRIX) 15.75 MG/SPRAY SOLN Place 1 spray into the nose every 6 (six) hours as needed (for pain).     nortriptyline (PAMELOR) 25 MG capsule Take 25 mg by mouth at bedtime.  0   ondansetron (ZOFRAN) 4 MG tablet TAKE 1 TABLET (4 MG TOTAL) BY MOUTH EVERY 8 (EIGHT) HOURS AS NEEDED FOR NAUSEA OR VOMITING. 20 tablet 2   oxybutynin (DITROPAN) 5 MG tablet  Take 5 mg by mouth at bedtime.  11   pantoprazole (PROTONIX) 40 MG tablet Take 1 tablet by mouth once daily 90 tablet 0   sucralfate (CARAFATE) 1 g tablet Take 1 tablet (1 g total) by mouth 3 (three) times daily. Take 20 minutes after meals and at bedtime 120 tablet 1   tamsulosin (FLOMAX) 0.4 MG CAPS capsule TAKE 1 CAPSULE (0.4 MG TOTAL) BY MOUTH TWICE DAILY 180 capsule 1   timolol (TIMOPTIC) 0.5 % ophthalmic solution Place 1 drop into both eyes daily.  6   tiZANidine (ZANAFLEX) 4 MG capsule TAKE 1 CAPSULE BY ORAL ROUTE 3 TIMES EVERY DAY (Patient taking differently: Take 4 mg by mouth 3 (three) times daily as needed for muscle spasms. ) 60 capsule 2   tretinoin (RETIN-A) 0.1 % cream APPLY  CREAM TOPICALLY TO AFFECTED AREA OF FACE AT BEDTIME 40 g 0   zolpidem (AMBIEN) 10 MG tablet TAKE 1 TABLET BY MOUTH AT BEDTIME AS NEEDED FOR SLEEP 90 tablet 1   No current facility-administered medications for this visit.     Allergies as of 06/14/2019 - Review Complete 06/08/2019  Allergen Reaction Noted   Tramadol Itching 10/03/2011   Morphine and related Itching 12/06/2011   Oxycodone Itching 11/20/2017    Family History  Problem Relation  Age of Onset   Alcohol abuse Father    Stroke Father    Heart disease Father    Arthritis Mother    Hyperlipidemia Mother    Goiter Mother        resection of benign goiter   Hypertension Sister    Diabetes Sister    Goiter Sister        resection of benign goiter   Diabetes Brother    Alcohol abuse Brother    Alcohol abuse Cousin    Heart disease Other        Aunt   Colon cancer Neg Hx     Social History   Socioeconomic History   Marital status: Single    Spouse name: Not on file   Number of children: 0   Years of education: Not on file   Highest education level: Not on file  Occupational History   Occupation: [employer]  Social Needs   Financial resource strain: Not hard at all   Food insecurity    Worry: Never true    Inability: Never true   Transportation needs    Medical: No    Non-medical: No  Tobacco Use   Smoking status: Current Every Day Smoker    Packs/day: 0.50    Years: 40.00    Pack years: 20.00    Types: Cigarettes   Smokeless tobacco: Never Used  Substance and Sexual Activity   Alcohol use: No    Alcohol/week: 0.0 standard drinks    Comment: hx of alcohol abuse   Drug use: Not Currently    Types: "Crack" cocaine    Comment: history of, " clean for 24 MONTHS"   Sexual activity: Not Currently  Lifestyle   Physical activity    Days per week: 0 days    Minutes per session: 0 min   Stress: Rather much  Relationships   Social connections    Talks on phone: More than three times a week    Gets together: More than three times a week    Attends religious service: More than 4 times per year    Active member of club or organization: Yes  Attends meetings of clubs or organizations: More than 4 times per year    Relationship status: Never married   Intimate partner violence    Fear of current or ex partner: Not on file    Emotionally abused: Not on file    Physically abused: Not on file    Forced  sexual activity: Not on file  Other Topics Concern   Not on file  Social History Narrative   Not on file    Review of Systems:    Constitutional: No weight loss, fever or chills Cardiovascular: No chest pain  Respiratory: No SOB  Gastrointestinal: See HPI and otherwise negative   Physical Exam:  Vital signs: Temp 98.1 F (36.7 C)    Ht 5' 11.65" (1.82 m) Comment: height measured without shoes   Wt 164 lb 2 oz (74.4 kg)    BMI 22.47 kg/m   Constitutional:   Pleasant AA male appears to be in NAD, Well developed, Well nourished, alert and cooperative Respiratory: Respirations even and unlabored. Lungs clear to auscultation bilaterally.   No wheezes, crackles, or rhonchi.  Cardiovascular: Normal S1, S2. No MRG. Regular rate and rhythm. No peripheral edema, cyanosis or pallor.  Gastrointestinal:  Soft, nondistended,moderate epigastric ttp. No rebound or guarding. Normal bowel sounds. No appreciable masses or hepatomegaly. Psychiatric: Demonstrates good judgement and reason without abnormal affect or behaviors.  MOST RECENT LABS AND IMAGING: CBC    Component Value Date/Time   WBC 10.2 05/03/2019 1613   RBC 4.84 05/03/2019 1613   HGB 14.6 05/03/2019 1613   HCT 44.6 05/03/2019 1613   PLT 384.0 05/03/2019 1613   MCV 92.1 05/03/2019 1613   MCH 29.1 10/26/2018 0435   MCHC 32.7 05/03/2019 1613   RDW 13.7 05/03/2019 1613   LYMPHSABS 2.4 05/03/2019 1613   MONOABS 1.1 (H) 05/03/2019 1613   EOSABS 0.2 05/03/2019 1613   BASOSABS 0.1 05/03/2019 1613    CMP     Component Value Date/Time   NA 137 05/03/2019 1613   K 3.7 05/03/2019 1613   CL 98 05/03/2019 1613   CO2 31 05/03/2019 1613   GLUCOSE 92 05/03/2019 1613   BUN 10 05/03/2019 1613   CREATININE 1.12 05/03/2019 1613   CALCIUM 9.4 05/03/2019 1613   PROT 7.3 05/03/2019 1613   ALBUMIN 4.4 05/03/2019 1613   AST 19 05/03/2019 1613   ALT 21 05/03/2019 1613   ALKPHOS 135 (H) 05/03/2019 1613   BILITOT 0.5 05/03/2019 1613    GFRNONAA >60 10/26/2018 0435   GFRAA >60 10/26/2018 0435    Assessment: 1.  GERD: Breakthrough symptoms 2.  Epigastric pain: Continues per patient on twice daily PPI and Famotidine twice daily 3.  Chronic anticoagulation: On Plavix status post stroke in January, it was recommended to stay on this for 6 months  Plan: 1.  Continue Famotidine twice daily and PPI twice daily (this is 20mg  of Nexium and 40mg  Pantoprazole as insurance will not pay for BID). 2.  Refilled GI cocktail, 5-10 mL's every 4-6 hours as needed for epigastric pain breakthrough reflux symptoms. Refillx2 3.  Scheduled patient for a diagnostic EGD in the [place] with Dr. Fuller Plan.  Did discuss risks, benefits, limitations and alternatives and patient agrees to proceed. 4.  Advised patient to hold his Plavix for 5 days prior to time of procedure.  We will contact patient's neurologist to ensure that holding the patient's Plavix is acceptable for him. 5.  Reviewed antireflux diet and lifestyle modifications. 6.  Patient to follow  in clinic following EGD as advised by Dr. Fuller Plan.  Ellouise Newer, PA-C [facility] 06/14/2019, 9:53 AM  Cc: Biagio Borg, MD

## 2019-06-14 NOTE — Telephone Encounter (Signed)
I have reviewed the patient's chart.  He had a stroke in January 2020 and seems to have done well since then without recurrent stroke or TIA symptoms.  He may hold Plavix for 3 to 5 days prior to the procedure and resume it after the procedure when safe with a small but acceptable periprocedural risk of TIA/stroke if patient is willing

## 2019-06-14 NOTE — Patient Instructions (Addendum)
We have sent the following medications to your pharmacy for you to pick up at your convenience:  Gi cocktail   Continue Nexium, Pantoprazole 40 mg daily, and Famotidine 40 mg twice a day  You have been scheduled for an endoscopy. Please follow the written instructions given to you at your visit today. If you use inhalers (even only as needed), please bring them with you on the day of your procedure.

## 2019-06-15 NOTE — Progress Notes (Signed)
Reviewed and agree with management plan.  Doree Kuehne T. Rhonda Vangieson, MD FACG [facility]  

## 2019-06-18 ENCOUNTER — Encounter: Payer: Self-pay | Admitting: Gastroenterology

## 2019-06-25 ENCOUNTER — Other Ambulatory Visit: Payer: Self-pay | Admitting: Internal Medicine

## 2019-06-30 ENCOUNTER — Telehealth: Payer: Self-pay

## 2019-06-30 NOTE — Telephone Encounter (Signed)
Covid-19 screening questions   Do you now or have you had a fever in the last 14 days?  Do you have any respiratory symptoms of shortness of breath or cough now or in the last 14 days?  Do you have any family members or close contacts with diagnosed or suspected Covid-19 in the past 14 days?  Have you been tested for Covid-19 and found to be positive?       

## 2019-07-01 ENCOUNTER — Telehealth: Payer: Self-pay

## 2019-07-01 ENCOUNTER — Ambulatory Visit (INDEPENDENT_AMBULATORY_CARE_PROVIDER_SITE_OTHER): Payer: Medicare Other

## 2019-07-01 ENCOUNTER — Ambulatory Visit (AMBULATORY_SURGERY_CENTER): Payer: Medicare Other | Admitting: Gastroenterology

## 2019-07-01 ENCOUNTER — Encounter: Payer: Self-pay | Admitting: Gastroenterology

## 2019-07-01 ENCOUNTER — Other Ambulatory Visit: Payer: Self-pay | Admitting: Gastroenterology

## 2019-07-01 ENCOUNTER — Other Ambulatory Visit: Payer: Self-pay

## 2019-07-01 VITALS — BP 113/80 | HR 63 | Temp 97.9°F | Resp 13 | Ht 71.0 in | Wt 164.0 lb

## 2019-07-01 DIAGNOSIS — Z23 Encounter for immunization: Secondary | ICD-10-CM | POA: Diagnosis not present

## 2019-07-01 DIAGNOSIS — Z299 Encounter for prophylactic measures, unspecified: Secondary | ICD-10-CM

## 2019-07-01 DIAGNOSIS — K219 Gastro-esophageal reflux disease without esophagitis: Secondary | ICD-10-CM

## 2019-07-01 DIAGNOSIS — K2970 Gastritis, unspecified, without bleeding: Secondary | ICD-10-CM

## 2019-07-01 DIAGNOSIS — K2950 Unspecified chronic gastritis without bleeding: Secondary | ICD-10-CM | POA: Diagnosis not present

## 2019-07-01 DIAGNOSIS — R1013 Epigastric pain: Secondary | ICD-10-CM

## 2019-07-01 MED ORDER — SODIUM CHLORIDE 0.9 % IV SOLN: 500.0000 mL | Freq: Once | INTRAVENOUS | Status: DC

## 2019-07-01 NOTE — Progress Notes (Signed)
Called to room to assist during endoscopic procedure.  Patient ID and intended procedure confirmed with present staff. Received instructions for my participation in the procedure from the performing physician.  

## 2019-07-01 NOTE — Patient Instructions (Signed)
Impression/Recommendations:  Gastritis handout given to patient.  Resume previous diet. Continue present medications.  FD Gard 1-2 by mouth 3 times daily as needed for dyspepsia.  Await pathology results.  Perform RUQ ultrasound at next available appointment. Resume Plavix (clopidogre) at prior dose tomorrow.  Refer to managing physician for further adjustment of therapy.  YOU HAD AN ENDOSCOPIC PROCEDURE TODAY AT [facility]:   Refer to the procedure report that was given to you for any specific questions about what was found during the examination.  If the procedure report does not answer your questions, please call your gastroenterologist to clarify.  If you requested that your care partner not be given the details of your procedure findings, then the procedure report has been included in a sealed envelope for you to review at your convenience later.  YOU SHOULD EXPECT: Some feelings of bloating in the abdomen. Passage of more gas than usual.  Walking can help get rid of the air that was put into your GI tract during the procedure and reduce the bloating. If you had a lower endoscopy (such as a colonoscopy or flexible sigmoidoscopy) you may notice spotting of blood in your stool or on the toilet paper. If you underwent a bowel prep for your procedure, you may not have a normal bowel movement for a few days.  Please Note:  You might notice some irritation and congestion in your nose or some drainage.  This is from the oxygen used during your procedure.  There is no need for concern and it should clear up in a day or so.  SYMPTOMS TO REPORT IMMEDIATELY:   Following upper endoscopy (EGD)  Vomiting of blood or coffee ground material  New chest pain or pain under the shoulder blades  Painful or persistently difficult swallowing  New shortness of breath  Fever of 100F or higher  Black, tarry-looking stools  For urgent or emergent issues, a gastroenterologist can be  reached at any hour by calling [phone].   DIET:  We do recommend a small meal at first, but then you may proceed to your regular diet.  Drink plenty of fluids but you should avoid alcoholic beverages for 24 hours.  ACTIVITY:  You should plan to take it easy for the rest of today and you should NOT DRIVE or use heavy machinery until tomorrow (because of the sedation medicines used during the test).    FOLLOW UP: Our staff will call the number listed on your records 48-72 hours following your procedure to check on you and address any questions or concerns that you may have regarding the information given to you following your procedure. If we do not reach you, we will leave a message.  We will attempt to reach you two times.  During this call, we will ask if you have developed any symptoms of COVID 19. If you develop any symptoms (ie: fever, flu-like symptoms, shortness of breath, cough etc.) before then, please call [phone].  If you test positive for Covid 19 in the 2 weeks post procedure, please call and report this information to [place].    If any biopsies were taken you will be contacted by phone or by letter within the next 1-3 weeks.  Please call us at [phone] if you have not heard about the biopsies in 3 weeks.    SIGNATURES/CONFIDENTIALITY: You and/or your care partner have signed paperwork which will be entered into your electronic medical record.  These signatures attest to  the fact that that the information above on your After Visit Summary has been reviewed and is understood.  Full responsibility of the confidentiality of this discharge information lies with you and/or your care-partner.

## 2019-07-01 NOTE — Telephone Encounter (Signed)
Per dr Jenny Reichmann, ok for patient to get pneumo 23 vaccine today, also ok to get prevnar 13 in one year from today

## 2019-07-01 NOTE — Op Note (Addendum)
[facility] Patient Name: Mark Dunlap Procedure Date: 07/01/2019 10:07 AM MRN: [id number] Endoscopist: Ladene Artist , MD Age: 58 Referring MD:  Date of Birth: May 18, 1961 Gender: Male Account #: [id number] Procedure:                Upper GI endoscopy Indications:              Epigastric abdominal pain, Gastroesophageal reflux                            disease Medicines:                Monitored Anesthesia Care Procedure:                Pre-Anesthesia Assessment:                           - Prior to the procedure, a History and Physical                            was performed, and patient medications and                            allergies were reviewed. The patient's tolerance of                            previous anesthesia was also reviewed. The risks                            and benefits of the procedure and the sedation                            options and risks were discussed with the patient.                            All questions were answered, and informed consent                            was obtained. Prior Anticoagulants: The patient has                            taken Plavix (clopidogrel), last dose was 5 days                            prior to procedure. ASA Grade Assessment: III - A                            patient with severe systemic disease. After                            reviewing the risks and benefits, the patient was                            deemed in satisfactory condition to undergo the  procedure.                           After obtaining informed consent, the endoscope was                            passed under direct vision. Throughout the                            procedure, the patient's blood pressure, pulse, and                            oxygen saturations were monitored continuously. The                            Endoscope was introduced through the mouth, and   advanced to the second part of duodenum. The upper                            GI endoscopy was accomplished without difficulty.                            The patient tolerated the procedure well. Scope In: Scope Out: Findings:                 The examined esophagus was normal.                           Patchy mild inflammation characterized by erythema                            and granularity was found in the gastric body and                            in the gastric antrum. Biopsies were taken with a                            cold forceps for histology.                           The exam of the stomach was otherwise normal.                           The duodenal bulb and second portion of the                            duodenum were normal. Complications:            No immediate complications. Estimated Blood Loss:     Estimated blood loss was minimal. Impression:               - Normal esophagus.                           - Mild gastritis. Biopsied.                           -  Normal duodenal bulb and second portion of the                            duodenum. Recommendation:           - Patient has a contact number available for                            emergencies. The signs and symptoms of potential                            delayed complications were discussed with the                            patient. Return to normal activities tomorrow.                            Written discharge instructions were provided to the                            patient.                           - Resume previous diet.                           - Continue present medications.                           - FDgard 1-2 po tid prn dyspepsia.                           - Await pathology results.                           - Perform a RUQ ultrasound at the next available                            appointment.                           - Resume Plavix (clopidogrel) at prior dose                             tomorrow. Refer to managing physician for further                            adjustment of therapy. Ladene Artist, MD 07/01/2019 10:21:27 AM This report has been signed electronically.

## 2019-07-01 NOTE — Progress Notes (Signed)
Pt Drowsy. VSS. To PACU, report to RN. No anesthetic complications noted.  

## 2019-07-01 NOTE — Progress Notes (Signed)
Temp-June bullock  Vital signs-courtney [place]

## 2019-07-02 ENCOUNTER — Other Ambulatory Visit: Payer: Self-pay

## 2019-07-02 DIAGNOSIS — R1013 Epigastric pain: Secondary | ICD-10-CM

## 2019-07-02 NOTE — Progress Notes (Signed)
Patient notified of RUQ [place] recommended via procedure report 07/01/19.  Patient notified that he is scheduled for 07/07/19 [facility] 8:45 arrival for 9:00 appt.  He verbalized understanding of arrival date and time and to be NPO after midnight.

## 2019-07-05 ENCOUNTER — Telehealth: Payer: Self-pay

## 2019-07-05 NOTE — Telephone Encounter (Signed)
  Follow up Call-  Call back number 07/01/2019  Post procedure Call Back phone  # [phone]  Permission to leave phone message Yes  Some recent data might be hidden     Patient questions:  Do you have a fever, pain , or abdominal swelling? No. Pain Score  0 *  Have you tolerated food without any problems? Yes.    Have you been able to return to your normal activities? Yes.    Do you have any questions about your discharge instructions: Diet   No. Medications  No. Follow up visit  No.  Do you have questions or concerns about your Care? No.  Actions: * If pain score is 4 or above: 1. Have you developed a fever since your procedure? no  2.   Have you had an respiratory symptoms (SOB or cough) since your procedure? no  3.   Have you tested positive for COVID 19 since your procedure no  4.   Have you had any family members/close contacts diagnosed with the COVID 19 since your procedure?  no   If yes to any of these questions please route to Joylene John, RN and Alphonsa Gin, Therapist, sports.

## 2019-07-07 ENCOUNTER — Ambulatory Visit (HOSPITAL_COMMUNITY)
Admission: RE | Admit: 2019-07-07 | Discharge: 2019-07-07 | Disposition: A | Discharge status: Home / Self Care | Payer: Medicare Other | Source: Ambulatory Visit | Attending: Gastroenterology | Admitting: Gastroenterology

## 2019-07-07 ENCOUNTER — Other Ambulatory Visit: Payer: Self-pay

## 2019-07-07 DIAGNOSIS — R1013 Epigastric pain: Secondary | ICD-10-CM | POA: Insufficient documentation

## 2019-07-08 ENCOUNTER — Encounter: Payer: Self-pay | Admitting: Gastroenterology

## 2019-07-15 ENCOUNTER — Other Ambulatory Visit: Payer: Self-pay | Admitting: Internal Medicine

## 2019-07-28 ENCOUNTER — Other Ambulatory Visit: Payer: Self-pay | Admitting: Internal Medicine

## 2019-08-01 ENCOUNTER — Other Ambulatory Visit: Payer: Self-pay | Admitting: Internal Medicine

## 2019-09-25 ENCOUNTER — Other Ambulatory Visit: Payer: Self-pay | Admitting: Internal Medicine

## 2019-10-19 ENCOUNTER — Other Ambulatory Visit: Payer: Self-pay | Admitting: Physician Assistant

## 2019-10-20 ENCOUNTER — Other Ambulatory Visit: Payer: Self-pay | Admitting: General Surgery

## 2019-10-20 MED ORDER — LIDOCAINE VISCOUS HCL 2 % MT SOLN: OROMUCOSAL | 0 refills | Status: DC

## 2019-10-20 NOTE — Progress Notes (Signed)
Refill request sent from pharmacy with medication break dowm. ta

## 2019-10-27 ENCOUNTER — Other Ambulatory Visit: Payer: Self-pay | Admitting: Internal Medicine

## 2019-11-03 ENCOUNTER — Ambulatory Visit: Payer: Medicare Other | Admitting: Internal Medicine

## 2019-11-05 ENCOUNTER — Ambulatory Visit (INDEPENDENT_AMBULATORY_CARE_PROVIDER_SITE_OTHER): Payer: Medicare Other | Admitting: Internal Medicine

## 2019-11-05 ENCOUNTER — Encounter: Payer: Self-pay | Admitting: Internal Medicine

## 2019-11-05 ENCOUNTER — Other Ambulatory Visit: Payer: Self-pay

## 2019-11-05 VITALS — BP 134/88 | HR 68 | Temp 98.1°F | Ht 71.0 in | Wt 165.0 lb

## 2019-11-05 DIAGNOSIS — E785 Hyperlipidemia, unspecified: Secondary | ICD-10-CM | POA: Diagnosis not present

## 2019-11-05 DIAGNOSIS — F419 Anxiety disorder, unspecified: Secondary | ICD-10-CM | POA: Diagnosis not present

## 2019-11-05 DIAGNOSIS — I10 Essential (primary) hypertension: Secondary | ICD-10-CM | POA: Diagnosis not present

## 2019-11-05 DIAGNOSIS — F17200 Nicotine dependence, unspecified, uncomplicated: Secondary | ICD-10-CM

## 2019-11-05 DIAGNOSIS — M79671 Pain in right foot: Secondary | ICD-10-CM

## 2019-11-05 DIAGNOSIS — Z0000 Encounter for general adult medical examination without abnormal findings: Secondary | ICD-10-CM | POA: Diagnosis not present

## 2019-11-05 DIAGNOSIS — F329 Major depressive disorder, single episode, unspecified: Secondary | ICD-10-CM

## 2019-11-05 DIAGNOSIS — Z0001 Encounter for general adult medical examination with abnormal findings: Secondary | ICD-10-CM

## 2019-11-05 LAB — CBC WITH DIFFERENTIAL/PLATELET
Basophils Absolute: 0 10*3/uL (ref 0.0–0.1)
Basophils Relative: 0.5 % (ref 0.0–3.0)
Eosinophils Absolute: 0.2 10*3/uL (ref 0.0–0.7)
Eosinophils Relative: 2.9 % (ref 0.0–5.0)
HCT: 41.3 % (ref 39.0–52.0)
Hemoglobin: 13.5 g/dL (ref 13.0–17.0)
Lymphocytes Relative: 26.3 % (ref 12.0–46.0)
Lymphs Abs: 2.2 10*3/uL (ref 0.7–4.0)
MCHC: 32.7 g/dL (ref 30.0–36.0)
MCV: 94.3 fl (ref 78.0–100.0)
Monocytes Absolute: 0.8 10*3/uL (ref 0.1–1.0)
Monocytes Relative: 9.9 % (ref 3.0–12.0)
Neutro Abs: 5.1 10*3/uL (ref 1.4–7.7)
Neutrophils Relative %: 60.4 % (ref 43.0–77.0)
Platelets: 352 10*3/uL (ref 150.0–400.0)
RBC: 4.38 Mil/uL (ref 4.22–5.81)
RDW: 14.3 % (ref 11.5–15.5)
WBC: 8.4 10*3/uL (ref 4.0–10.5)

## 2019-11-05 LAB — URINALYSIS, ROUTINE W REFLEX MICROSCOPIC
Bilirubin Urine: NEGATIVE
Hgb urine dipstick: NEGATIVE
Ketones, ur: NEGATIVE
Nitrite: NEGATIVE
RBC / HPF: NONE SEEN (ref 0–?)
Specific Gravity, Urine: 1.02 (ref 1.000–1.030)
Total Protein, Urine: NEGATIVE
Urine Glucose: NEGATIVE
Urobilinogen, UA: 0.2 (ref 0.0–1.0)
pH: 7 (ref 5.0–8.0)

## 2019-11-05 LAB — BASIC METABOLIC PANEL
BUN: 7 mg/dL (ref 6–23)
CO2: 31 mEq/L (ref 19–32)
Calcium: 9.5 mg/dL (ref 8.4–10.5)
Chloride: 101 mEq/L (ref 96–112)
Creatinine, Ser: 1.01 mg/dL (ref 0.40–1.50)
GFR: 91.25 mL/min (ref >60.00)
Glucose, Bld: 96 mg/dL (ref 70–99)
Potassium: 4.2 mEq/L (ref 3.5–5.1)
Sodium: 140 mEq/L (ref 135–145)

## 2019-11-05 LAB — LIPID PANEL
Cholesterol: 119 mg/dL (ref 0–200)
HDL: 51.9 mg/dL (ref >39.00)
LDL Cholesterol: 46 mg/dL (ref 0–99)
NonHDL: 66.63
Total CHOL/HDL Ratio: 2
Triglycerides: 105 mg/dL (ref 0.0–149.0)
VLDL: 21 mg/dL (ref 0.0–40.0)

## 2019-11-05 LAB — HEPATIC FUNCTION PANEL
ALT: 17 U/L (ref 0–53)
AST: 14 U/L (ref 0–37)
Albumin: 4 g/dL (ref 3.5–5.2)
Alkaline Phosphatase: 139 U/L — ABNORMAL HIGH (ref 39–117)
Bilirubin, Direct: 0.1 mg/dL (ref 0.0–0.3)
Total Bilirubin: 0.4 mg/dL (ref 0.2–1.2)
Total Protein: 6.8 g/dL (ref 6.0–8.3)

## 2019-11-05 LAB — TSH: TSH: 1.33 u[IU]/mL (ref 0.35–4.50)

## 2019-11-05 LAB — PSA: PSA: 5.54 ng/mL — ABNORMAL HIGH (ref 0.10–4.00)

## 2019-11-05 MED ORDER — OXYBUTYNIN CHLORIDE 5 MG PO TABS: 5.0000 mg | ORAL_TABLET | Freq: Every day | ORAL | 3 refills | Status: DC

## 2019-11-05 MED ORDER — TRETINOIN 0.1 % EX CREA: TOPICAL_CREAM | CUTANEOUS | 1 refills | Status: DC

## 2019-11-05 MED ORDER — LIDOCAINE VISCOUS HCL 2 % MT SOLN: OROMUCOSAL | 3 refills | Status: DC

## 2019-11-05 NOTE — Progress Notes (Addendum)
Subjective:    Patient ID: Mark Dunlap, male    DOB: 02-Mar-1961, 59 y.o.   MRN: [id number]  HPI:  Here for wellness and f/u;  Overall doing ok;  Pt denies Chest pain, worsening SOB, DOE, wheezing, orthopnea, PND, worsening LE edema, palpitations, dizziness or syncope.  Pt denies neurological change such as new headache, facial or extremity weakness.  Pt denies polydipsia, polyuria, or low sugar symptoms. Pt states overall good compliance with treatment and medications, good tolerability, and has been trying to follow appropriate diet.  Pt denies worsening depressive symptoms, suicidal ideation or panic. No fever, night sweats, wt loss, loss of appetite, or other constitutional symptoms.  Pt states good ability with ADL's, has low fall risk, home safety reviewed and adequate, no other significant changes in hearing or vision, and only occasionally active with exercise. Also with c/o right foot pain and swelling, mild to mod, constant, 1 mo, worse to walk, better to sit, mostly located between `st and 2nd mtps, without trauma or hx of gout. Past Medical History:  Diagnosis Date  . Acute prostatitis 05/23/2009  . Adjustment disorder with depressed mood 09/2007  . Cataract    bilateral  . CERVICAL RADICULOPATHY, LEFT 08/30/2008  . CVA (cerebrovascular accident) ([place])   . DJD (degenerative joint disease) of hip   . DJD (degenerative joint disease) of knee    left knee  . GERD (gastroesophageal reflux disease)   . 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 H/O: substance abuse ([place])    hx of ETOH/Crack cocaine-none since 11/08 per pt/Does not Drive due to this  . HYPERTENSION 08/30/2008  . HYPERTHYROIDISM 02/13/2010   pt was told by  Dr Jenny Reichmann  that thyroid was now back to normal .. 2012 ...   . HYPOTENSION 10/31/2009  . 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 Other and unspecified  hyperlipidemia 08/20/2013  . PONV (postoperative nausea and vomiting)   . Seasonal allergies   . SPINAL STENOSIS, CERVICAL 08/08/2009  . Stroke ([place])   . THYROID NODULE, RIGHT 03/02/2010   Past Surgical History:  Procedure Laterality Date  . ANTERIOR CERVICAL DECOMP/DISCECTOMY FUSION  10/11/2011   Procedure: ANTERIOR CERVICAL DECOMPRESSION/DISCECTOMY FUSION 3 LEVELS;  Surgeon: Eustace Moore;  Location: [facility];  Service: Neurosurgery;  Laterality: N/A;  Cervical three-four ,cervical four five cervical five six Anterior Cervical Decompression Fusion with peek + plate Nuvasive translational plate Orthofix peek (2 1/2 hours) Rm # 32  . ANTERIOR CERVICAL DECOMP/DISCECTOMY FUSION N/A 07/29/2014   Procedure: ANTERIOR CERVICAL DECOMPRESSION/DISCECTOMY FUSION 1 LEVEL/HARDWARE REMOVAL;  Surgeon: Eustace Moore, MD;  Location: [facility];  Service: Neurosurgery;  Laterality: N/A;  cervical six-seven  . POSTERIOR CERVICAL FUSION/FORAMINOTOMY N/A 09/23/2013   Procedure: CERVICALTWO TO CERVICAL SEVEN POSTERIOR CERVICAL FUSION/FORAMINOTOMY WITH LATERAL MASS FIXATION;  Surgeon: Eustace Moore, MD;  Location: [facility];  Service: Neurosurgery;  Laterality: N/A;  . Right Hand I&D  12/07   s/p rdue to abscess-Dr. Amedeo Plenty  . TOTAL HIP ARTHROPLASTY  12/17/2011   Procedure: TOTAL HIP ARTHROPLASTY;  Surgeon: Johnny Bridge, MD;  Location: [facility];  Service: Orthopedics;  Laterality: Left;    reports that he has been smoking cigarettes. He has a 20.00 pack-year smoking history. He has never used smokeless tobacco. He reports previous drug use. Drug: "Crack" cocaine. He reports  that he does not drink alcohol. family history includes Alcohol abuse in his brother, cousin, and father; Arthritis in his mother; Diabetes in his brother and sister; Goiter in his mother and sister; Heart disease in his father and another family member; Hyperlipidemia in his mother; Hypertension in his sister; Stroke in his  father. Allergies  Allergen Reactions  . Tramadol Itching  . Morphine And Related Itching  . Oxycodone Itching   Current Outpatient Medications on File Prior to Visit  Medication Sig Dispense Refill  . acetaminophen (TYLENOL) 500 MG tablet Take 1,000 mg by mouth every 6 (six) hours as needed for moderate pain or headache.    [name] adapalene (DIFFERIN) 0.1 % gel APPLY 1 APPLICATION TOPICALLY AT BEDTIME. 45 g 1  . atorvastatin (LIPITOR) 40 MG tablet TAKE 1 TABLET BY MOUTH ONCE DAILY AT 6PM 180 tablet 1  . benzoyl peroxide (BENZOYL PEROXIDE) 5 % external liquid Apply 1 application topically 2 (two) times daily. 226 g 0  . bimatoprost (LUMIGAN) 0.01 % SOLN Place 1 drop into both eyes at bedtime.    . carboxymethylcellulose (REFRESH PLUS) 0.5 % SOLN Place 1 drop into both eyes 3 (three) times daily as needed (for clear eyes).     . citalopram (CELEXA) 20 MG tablet Take 1 tablet (20 mg total) by mouth daily. 90 tablet 3  . clopidogrel (PLAVIX) 75 MG tablet Take 1 tablet by mouth once daily 180 tablet 0  . diphenhydrAMINE (BENADRYL) 25 mg capsule Take 50 mg by mouth every 6 (six) hours as needed for itching.     . famotidine (PEPCID) 40 MG tablet Take 1 tablet (40 mg total) by mouth 2 (two) times daily. Take every morning and at bedtime 60 tablet 3  . fluticasone (FLONASE) 50 MCG/ACT nasal spray USE 2 SPRAYS DAILY IN BOTH NOSTRILS 32 mL 0  . gabapentin (NEURONTIN) 100 MG capsule Take 200 mg by mouth 2 (two) times daily.  2  . hydrochlorothiazide (MICROZIDE) 12.5 MG capsule TAKE 1 CAPSULE BY MOUTH EVERY DAY 90 capsule 1  . HYDROcodone-acetaminophen (NORCO) 10-325 MG per tablet Take 1 tablet by mouth every 6 (six) hours as needed for moderate pain. 60 tablet 0  . imiquimod (ALDARA) 5 % cream Apply topically 3 (three) times a week. 24 each 2  . Ketorolac Tromethamine (SPRIX) 15.75 MG/SPRAY SOLN Place 1 spray into the nose every 6 (six) hours as needed (for pain).    . nortriptyline (PAMELOR) 25 MG capsule  Take 25 mg by mouth at bedtime.  0  . ondansetron (ZOFRAN) 4 MG tablet TAKE 1 TABLET (4 MG TOTAL) BY MOUTH EVERY 8 (EIGHT) HOURS AS NEEDED FOR NAUSEA OR VOMITING. 20 tablet 2  . pantoprazole (PROTONIX) 40 MG tablet Take 1 tablet by mouth once daily 90 tablet 0  . sucralfate (CARAFATE) 1 g tablet Take 1 tablet (1 g total) by mouth 3 (three) times daily. Take 20 minutes after meals and at bedtime 120 tablet 1  . tamsulosin (FLOMAX) 0.4 MG CAPS capsule TAKE 1 CAPSULE (0.4 MG TOTAL) BY MOUTH TWICE DAILY 180 capsule 1  . timolol (TIMOPTIC) 0.5 % ophthalmic solution Place 1 drop into both eyes daily.  6  . zolpidem (AMBIEN) 10 MG tablet TAKE 1 TABLET BY MOUTH AT BEDTIME AS NEEDED FOR SLEEP 90 tablet 1  . AMBULATORY NON FORMULARY MEDICATION Medication Name: GI Cocktail 90 ml-Viscous lidocaine 90 ml-Dicyclomine 10 mg/68ml 270 ml  Maalox Total ml's 450 ml Take 5-10 ml's and swallow as  needed for breakthrough. 450 mL 2  . tiZANidine (ZANAFLEX) 4 MG capsule TAKE 1 CAPSULE BY ORAL ROUTE 3 TIMES EVERY DAY (Patient taking differently: Take 4 mg by mouth 3 (three) times daily as needed for muscle spasms. ) 60 capsule 2  . tiZANidine (ZANAFLEX) 4 MG tablet TAKE 1 TABLET BY MOUTH EVERY 8 HOURS AS NEEDED(NOT TO EXCEED 3 DOSES IN 24 HOURS)     No current facility-administered medications on file prior to visit.   Review of Systems All otherwise neg per pt     Objective:   Physical Exam BP 134/88 (BP Location: Left Arm, Patient Position: Sitting, Cuff Size: Normal)   Pulse 68   Temp 98.1 F (36.7 C) (Oral)   Ht 5\' 11"  (1.803 m)   Wt 165 lb (74.8 kg)   SpO2 97%   BMI 23.01 kg/m  VS noted,  Constitutional: Pt appears in NAD HENT: Head: NCAT.  Right Ear: External ear normal.  Left Ear: External ear normal.  Eyes: . Pupils are equal, round, and reactive to light. Conjunctivae and EOM are normal Nose: without d/c or deformity Neck: Neck supple. Gross normal ROM Cardiovascular: Normal rate and regular  rhythm.   Pulmonary/Chest: Effort normal and breath sounds without rales or wheezing.  Abd:  Soft, NT, ND, + BS, no organomegaly Neurological: Pt is alert. At baseline orientation, motor grossly intact Skin: Skin is warm. No rashes, other new lesions, no LE edema Psychiatric: Pt behavior is normal without agitation  Right distal foot with swelling tender to area between 1st and 2nd MTP All otherwise neg per pt Lab Results  Component Value Date   WBC 8.4 11/05/2019   HGB 13.5 11/05/2019   HCT 41.3 11/05/2019   PLT 352.0 11/05/2019   GLUCOSE 96 11/05/2019   CHOL 119 11/05/2019   TRIG 105.0 11/05/2019   HDL 51.90 11/05/2019   LDLDIRECT 100.0 09/02/2018   LDLCALC 46 11/05/2019   ALT 17 11/05/2019   AST 14 11/05/2019   NA 140 11/05/2019   K 4.2 11/05/2019   CL 101 11/05/2019   CREATININE 1.01 11/05/2019   BUN 7 11/05/2019   CO2 31 11/05/2019   TSH 1.33 11/05/2019   PSA 5.54 (H) 11/05/2019   INR 0.94 10/26/2018   HGBA1C 5.5 05/03/2019      Assessment & Plan:

## 2019-11-05 NOTE — Patient Instructions (Addendum)
Please continue all other medications as before, and refills have been done if requested.  Please have the pharmacy call with any other refills you may need.  Please continue your efforts at being more active, low cholesterol diet, and weight control.  You are otherwise up to date with prevention measures today.  Please keep your appointments with your specialists as you may have planned  You will be contacted regarding the referral for: foot doctor (podiatry)  Please go to the LAB at the blood drawing area for the tests to be done   You will be contacted by phone if any changes need to be made immediately.  Otherwise, you will receive a letter about your results with an explanation, but please check with MyChart first.  Please remember to sign up for MyChart if you have not done so, as this will be important to you in the future with finding out test results, communicating by private email, and scheduling acute appointments online when needed.  Please make an Appointment to return in 6 months, or sooner if needed

## 2019-11-06 ENCOUNTER — Encounter: Payer: Self-pay | Admitting: Internal Medicine

## 2019-11-07 DIAGNOSIS — M79671 Pain in right foot: Secondary | ICD-10-CM | POA: Insufficient documentation

## 2019-11-07 NOTE — Assessment & Plan Note (Signed)
Encouraged to quit. 

## 2019-11-07 NOTE — Assessment & Plan Note (Signed)
stable overall by history and exam, recent data reviewed with pt, and pt to continue medical treatment as before,  to f/u any worsening symptoms or concerns  

## 2019-11-07 NOTE — Assessment & Plan Note (Signed)

## 2019-11-07 NOTE — Assessment & Plan Note (Signed)
For lower chol diet,  to f/u any worsening symptoms or concerns

## 2019-11-07 NOTE — Assessment & Plan Note (Signed)
?   mortons neuroma, for ortho referral  I spent 32 minutes preparing to see the patient by review of recent labs, imaging and procedures, obtaining and reviewing separately obtained history, communicating with the patient and family or caregiver, ordering medications, tests or procedures, and documenting clinical information in the EHR including the differential Dx, treatment, and any further evaluation and other management of right foot pain, smoker, HLD, HTN, anxiety depression

## 2019-11-17 ENCOUNTER — Encounter: Payer: Self-pay | Admitting: Internal Medicine

## 2019-11-17 ENCOUNTER — Ambulatory Visit (INDEPENDENT_AMBULATORY_CARE_PROVIDER_SITE_OTHER): Payer: Medicare Other | Admitting: Internal Medicine

## 2019-11-17 ENCOUNTER — Ambulatory Visit (INDEPENDENT_AMBULATORY_CARE_PROVIDER_SITE_OTHER): Payer: Medicare Other | Admitting: Nurse Practitioner

## 2019-11-17 VITALS — BP 120/80 | HR 84 | Temp 98.9°F | Ht 72.0 in | Wt 171.0 lb

## 2019-11-17 DIAGNOSIS — Z1152 Encounter for screening for COVID-19: Secondary | ICD-10-CM

## 2019-11-17 DIAGNOSIS — R197 Diarrhea, unspecified: Secondary | ICD-10-CM

## 2019-11-17 DIAGNOSIS — R05 Cough: Secondary | ICD-10-CM

## 2019-11-17 DIAGNOSIS — R0689 Other abnormalities of breathing: Secondary | ICD-10-CM

## 2019-11-17 DIAGNOSIS — R059 Cough, unspecified: Secondary | ICD-10-CM

## 2019-11-17 MED ORDER — ONDANSETRON 4 MG PO TBDP: 4.0000 mg | ORAL_TABLET | Freq: Three times a day (TID) | ORAL | 1 refills | Status: DC | PRN

## 2019-11-17 NOTE — Progress Notes (Signed)
ICD-10-CM   1. Cough  R05 Novel Coronavirus, NAA ([facility])  2. Diarrhea, unspecified type  R19.7 Novel Coronavirus, NAA ([facility])  3. Decreased breath sounds  R06.89 DG Chest 2 View  4. Encounter for screening for COVID-19  Z11.52    C.C.: needs COVID test  History of Present Illness: Mark Dunlap is a 59 y.o.  male who presents with concerns of possible COVID-19. Patient states he vomited during the night on two separate occasions during the past week. Patient states he has vomited at night several times in the past. Patient states "I eat a lot at night and have terrible reflux." Patient states the vomiting worsened after eating collard greens on Saturday night. Patient states he will sometimes vomit after coughing, especially when smoking. Patient has had diarrhea for the past several days. Denies any fever, chills, cough, shortness of breath, chest pain, abdominal pain, sore throat, loss of taste or smell. Patient denies any known recent sick contacts.   Past Medical History: Allergies: Allergies  Allergen Reactions  . Tramadol Itching  . Morphine And Related Itching  . Oxycodone Itching    Current Medications:  Current Outpatient Medications:  .  acetaminophen (TYLENOL) 500 MG tablet, Take 1,000 mg by mouth every 6 (six) hours as needed for moderate pain or headache., Disp: , Rfl:  .  adapalene (DIFFERIN) 0.1 % gel, APPLY 1 APPLICATION TOPICALLY AT BEDTIME., Disp: 45 g, Rfl: 1 .  AMBULATORY NON FORMULARY MEDICATION, Medication Name: GI Cocktail 90 ml-Viscous lidocaine 90 ml-Dicyclomine 10 mg/83ml 270 ml  Maalox Total ml's 450 ml Take 5-10 ml's and swallow as needed for breakthrough., Disp: 450 mL, Rfl: 2 .  atorvastatin (LIPITOR) 40 MG tablet, TAKE 1 TABLET BY MOUTH ONCE DAILY AT 6PM, Disp: 180 tablet, Rfl: 1 .  benzoyl peroxide (BENZOYL PEROXIDE) 5 % external liquid, Apply 1 application topically 2 (two) times daily., Disp: 226 g, Rfl: 0 .  bimatoprost (LUMIGAN) 0.01 % SOLN,  Place 1 drop into both eyes at bedtime., Disp: , Rfl:  .  citalopram (CELEXA) 20 MG tablet, Take 1 tablet (20 mg total) by mouth daily., Disp: 90 tablet, Rfl: 3 .  clopidogrel (PLAVIX) 75 MG tablet, Take 1 tablet by mouth once daily, Disp: 180 tablet, Rfl: 0 .  diphenhydrAMINE (BENADRYL) 25 mg capsule, Take 50 mg by mouth every 6 (six) hours as needed for itching. , Disp: , Rfl:  .  famotidine (PEPCID) 40 MG tablet, Take 1 tablet (40 mg total) by mouth 2 (two) times daily. Take every morning and at bedtime, Disp: 60 tablet, Rfl: 3 .  fluticasone (FLONASE) 50 MCG/ACT nasal spray, USE 2 SPRAYS DAILY IN BOTH NOSTRILS, Disp: 32 mL, Rfl: 0 .  gabapentin (NEURONTIN) 100 MG capsule, Take 200 mg by mouth 2 (two) times daily., Disp: , Rfl: 2 .  hydrochlorothiazide (MICROZIDE) 12.5 MG capsule, TAKE 1 CAPSULE BY MOUTH EVERY DAY, Disp: 90 capsule, Rfl: 1 .  HYDROcodone-acetaminophen (NORCO) 10-325 MG per tablet, Take 1 tablet by mouth every 6 (six) hours as needed for moderate pain., Disp: 60 tablet, Rfl: 0 .  imiquimod (ALDARA) 5 % cream, Apply topically 3 (three) times a week., Disp: 24 each, Rfl: 2 .  Ketorolac Tromethamine (SPRIX) 15.75 MG/SPRAY SOLN, Place 1 spray into the nose every 6 (six) hours as needed (for pain)., Disp: , Rfl:  .  lidocaine (XYLOCAINE) 2 % solution, 90 ml 2% lidocaine 90 ml dicyclomine 10mg /35ml 221ml of malox, Disp: 450 mL, Rfl: 3 .  nortriptyline (PAMELOR) 25 MG capsule, Take 25 mg by mouth at bedtime., Disp: , Rfl: 0 .  ondansetron (ZOFRAN) 4 MG tablet, TAKE 1 TABLET (4 MG TOTAL) BY MOUTH EVERY 8 (EIGHT) HOURS AS NEEDED FOR NAUSEA OR VOMITING., Disp: 20 tablet, Rfl: 2 .  oxybutynin (DITROPAN) 5 MG tablet, Take 1 tablet (5 mg total) by mouth at bedtime., Disp: 90 tablet, Rfl: 3 .  pantoprazole (PROTONIX) 40 MG tablet, Take 1 tablet by mouth once daily, Disp: 90 tablet, Rfl: 0 .  sucralfate (CARAFATE) 1 g tablet, Take 1 tablet (1 g total) by mouth 3 (three) times daily. Take 20  minutes after meals and at bedtime, Disp: 120 tablet, Rfl: 1 .  tamsulosin (FLOMAX) 0.4 MG CAPS capsule, TAKE 1 CAPSULE (0.4 MG TOTAL) BY MOUTH TWICE DAILY, Disp: 180 capsule, Rfl: 1 .  timolol (TIMOPTIC) 0.5 % ophthalmic solution, Place 1 drop into both eyes daily., Disp: , Rfl: 6 .  tiZANidine (ZANAFLEX) 4 MG tablet, TAKE 1 TABLET BY MOUTH EVERY 8 HOURS AS NEEDED(NOT TO EXCEED 3 DOSES IN 24 HOURS), Disp: , Rfl:  .  tretinoin (RETIN-A) 0.1 % cream, APPLY  CREAM TOPICALLY TO AFFECTED AREA OF FACE AT BEDTIME, Disp: 40 g, Rfl: 1 .  zolpidem (AMBIEN) 10 MG tablet, TAKE 1 TABLET BY MOUTH AT BEDTIME AS NEEDED FOR SLEEP, Disp: 90 tablet, Rfl: 1  Past Medical Problems: Past Medical History:  Diagnosis Date  . Acute prostatitis 05/23/2009  . Adjustment disorder with depressed mood 09/2007  . Cataract    bilateral  . CERVICAL RADICULOPATHY, LEFT 08/30/2008  . CVA (cerebrovascular accident) ([place])   . DJD (degenerative joint disease) of hip   . DJD (degenerative joint disease) of knee    left knee  . GERD (gastroesophageal reflux disease)   . 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 H/O: substance abuse ([place])    hx of ETOH/Crack cocaine-none since 11/08 per pt/Does not Drive due to this  . HYPERTENSION 08/30/2008  . HYPERTHYROIDISM 02/13/2010   pt was told by  Dr Jenny Reichmann  that thyroid was now back to normal .. 2012 ...   . HYPOTENSION 10/31/2009  . 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 Other and unspecified hyperlipidemia 08/20/2013  . PONV (postoperative nausea and vomiting)   . Seasonal allergies   . SPINAL STENOSIS, CERVICAL 08/08/2009  . Stroke ([place])   . THYROID NODULE, RIGHT 03/02/2010    Past Surgical History: Past Surgical History:  Procedure Laterality Date  . ANTERIOR CERVICAL DECOMP/DISCECTOMY FUSION  10/11/2011   Procedure: ANTERIOR CERVICAL DECOMPRESSION/DISCECTOMY FUSION 3 LEVELS;   Surgeon: Eustace Moore;  Location: [facility];  Service: Neurosurgery;  Laterality: N/A;  Cervical three-four ,cervical four five cervical five six Anterior Cervical Decompression Fusion with peek + plate Nuvasive translational plate Orthofix peek (2 1/2 hours) Rm # 32  . ANTERIOR CERVICAL DECOMP/DISCECTOMY FUSION N/A 07/29/2014   Procedure: ANTERIOR CERVICAL DECOMPRESSION/DISCECTOMY FUSION 1 LEVEL/HARDWARE REMOVAL;  Surgeon: Eustace Moore, MD;  Location: [facility];  Service: Neurosurgery;  Laterality: N/A;  cervical six-seven  . POSTERIOR CERVICAL FUSION/FORAMINOTOMY N/A 09/23/2013   Procedure: CERVICALTWO TO CERVICAL SEVEN POSTERIOR CERVICAL FUSION/FORAMINOTOMY WITH LATERAL MASS FIXATION;  Surgeon: Eustace Moore, MD;  Location: [facility];  Service: Neurosurgery;  Laterality: N/A;  . Right Hand I&D  12/07   s/p rdue  to abscess-Dr. Amedeo Plenty  . TOTAL HIP ARTHROPLASTY  12/17/2011   Procedure: TOTAL HIP ARTHROPLASTY;  Surgeon: Johnny Bridge, MD;  Location: [facility];  Service: Orthopedics;  Laterality: Left;    Social History: Social History   Socioeconomic History  . Marital status: Single    Spouse name: Not on file  . Number of children: 0  . Years of education: Not on file  . Highest education level: Not on file  Occupational History  . Occupation: Industries for [employer]  . Smoking status: Current Every Day Smoker    Packs/day: 0.50    Years: 40.00    Pack years: 20.00    Types: Cigarettes  . Smokeless tobacco: Never Used  Substance and Sexual Activity  . Alcohol use: No    Alcohol/week: 0.0 standard drinks    Comment: hx of alcohol abuse  . Drug use: Not Currently    Types: "Crack" cocaine    Comment: history of, " clean for 24 MONTHS"  . Sexual activity: Not Currently  Other Topics Concern  . Not on file  Social History Narrative  . Not on file   Social Determinants of Health   Financial Resource Strain:   . Difficulty of Paying Living Expenses:  Not on file  Food Insecurity:   . Worried About Charity fundraiser in the Last Year: Not on file  . Ran Out of Food in the Last Year: Not on file  Transportation Needs:   . Lack of Transportation (Medical): Not on file  . Lack of Transportation (Non-Medical): Not on file  Physical Activity:   . Days of Exercise per Week: Not on file  . Minutes of Exercise per Session: Not on file  Stress:   . Feeling of Stress : Not on file  Social Connections:   . Frequency of Communication with Friends and Family: Not on file  . Frequency of Social Gatherings with Friends and Family: Not on file  . Attends Religious Services: Not on file  . Active Member of Clubs or Organizations: Not on file  . Attends Archivist Meetings: Not on file  . Marital Status: Not on file  Intimate Partner Violence:   . Fear of Current or Ex-Partner: Not on file  . Emotionally Abused: Not on file  . Physically Abused: Not on file  . Sexually Abused: Not on file    Family History: Family History  Problem Relation Age of Onset  . Alcohol abuse Father   . Stroke Father   . Heart disease Father   . Arthritis Mother   . Hyperlipidemia Mother   . Goiter Mother        resection of benign goiter  . Hypertension Sister   . Diabetes Sister   . Goiter Sister        resection of benign goiter  . Diabetes Brother   . Alcohol abuse Brother   . Alcohol abuse Cousin   . Heart disease Other        Aunt  . Colon cancer Neg Hx   . Colon polyps Neg Hx   . Esophageal cancer Neg Hx   . Rectal cancer Neg Hx   . Stomach cancer Neg Hx     Review of Systems: Review of Systems  Constitutional: Negative.   HENT: Negative.   Eyes:       Blindness in left eye  Respiratory: Negative.   Cardiovascular: Negative.   Gastrointestinal: Positive for diarrhea, heartburn,  nausea and vomiting. Negative for abdominal pain and constipation.  Genitourinary: Negative.   Musculoskeletal: Negative.   Skin: Negative.    Neurological: Negative.     Physical Exam: Vital Signs BP 120/80   Pulse 84   Temp 98.9 F (37.2 C)   Ht 6' (1.829 m)   Wt 171 lb (77.6 kg)   SpO2 96%   BMI 23.19 kg/m  General:alert, cooperative, appears stated age and no distress Neck: normal Chest: symmetrical rise and fall Cardiac: regular rate and rhythm, +2 bilateral radial pulses Lungs: left lobes clear, significantly diminished right side Abdomen: soft, non-tender; bowel sounds normal; no masses,  no organomegaly Musculoskeletal: MAEx4 Neuro:A&O x3 Skin:warm, dry, intact  Assessment: Tramane Wernicke is a 59 yo male with a history of vomiting and diarrhea. Vomiting has recently worsened, but is not a new problem. Patient was in no acute distress. Vital signs within normal limits. Appeared well hydrated. Differentials include GERD, viral gastroenteritis, food-born illness, and COVID-19. Patient not experiencing any respiratory symptoms. However, right lung significantly diminished on exam. Chest x-ray from 2015 demonstrated "chronic right basilar changes and underlying COPD."   Plan: Patient advised to present to [facility] tomorrow for chest x-ray. COVID-19 test performed. Quarantine while awaiting test results. Patient advised to sit up at least an hour after meals. Strict return precautions provided.     Electronically signed by: Alfredo Batty, NP 11/18/2019 12:32 PM

## 2019-11-17 NOTE — Progress Notes (Signed)
Patient ID: THORSTEN BENNINGHOFF, male   DOB: 01-05-1961, 59 y.o.   MRN: [id number]  Phone visit  Cumulative time during 7-day interval 13 min, there was not an associated office visit for this concern within a 7 day period.  Verbal consent for services obtained from patient prior to services given.  Names of all persons present for services: Mark Cower, MD, patient  Chief complaint: infectious symptoms  History, background, results pertinent:  Here with 2-3 days onset cough, n/v and diarrhea, feeling tired and feverish, plans on going to work this evening. No known obvious covid exposure.   Past Medical History:  Diagnosis Date  . Acute prostatitis 05/23/2009  . Adjustment disorder with depressed mood 09/2007  . Cataract    bilateral  . CERVICAL RADICULOPATHY, LEFT 08/30/2008  . CVA (cerebrovascular accident) ([place])   . DJD (degenerative joint disease) of hip   . DJD (degenerative joint disease) of knee    left knee  . GERD (gastroesophageal reflux disease)   . 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 H/O: substance abuse ([place])    hx of ETOH/Crack cocaine-none since 11/08 per pt/Does not Drive due to this  . HYPERTENSION 08/30/2008  . HYPERTHYROIDISM 02/13/2010   pt was told by  Dr Jenny Reichmann  that thyroid was now back to normal .. 2012 ...   . HYPOTENSION 10/31/2009  . 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 Other and unspecified hyperlipidemia 08/20/2013  . PONV (postoperative nausea and vomiting)   . Seasonal allergies   . SPINAL STENOSIS, CERVICAL 08/08/2009  . Stroke ([place])   . THYROID NODULE, RIGHT 03/02/2010   No results found for this or any previous visit (from the past 48 hour(s)). Current Outpatient Medications on File Prior to Visit  Medication Sig Dispense Refill  . acetaminophen (TYLENOL) 500 MG tablet Take 1,000 mg by mouth every 6 (six) hours as needed for moderate pain or  headache.    [name] adapalene (DIFFERIN) 0.1 % gel APPLY 1 APPLICATION TOPICALLY AT BEDTIME. 45 g 1  . AMBULATORY NON FORMULARY MEDICATION Medication Name: GI Cocktail 90 ml-Viscous lidocaine 90 ml-Dicyclomine 10 mg/22ml 270 ml  Maalox Total ml's 450 ml Take 5-10 ml's and swallow as needed for breakthrough. 450 mL 2  . atorvastatin (LIPITOR) 40 MG tablet TAKE 1 TABLET BY MOUTH ONCE DAILY AT 6PM 180 tablet 1  . benzoyl peroxide (BENZOYL PEROXIDE) 5 % external liquid Apply 1 application topically 2 (two) times daily. 226 g 0  . bimatoprost (LUMIGAN) 0.01 % SOLN Place 1 drop into both eyes at bedtime.    . carboxymethylcellulose (REFRESH PLUS) 0.5 % SOLN Place 1 drop into both eyes 3 (three) times daily as needed (for clear eyes).     . citalopram (CELEXA) 20 MG tablet Take 1 tablet (20 mg total) by mouth daily. 90 tablet 3  . clopidogrel (PLAVIX) 75 MG tablet Take 1 tablet by mouth once daily 180 tablet 0  . diphenhydrAMINE (BENADRYL) 25 mg capsule Take 50 mg by mouth every 6 (six) hours as needed for itching.     . famotidine (PEPCID) 40 MG tablet Take 1 tablet (40 mg total) by mouth 2 (two) times daily. Take every morning and at bedtime 60 tablet 3  . fluticasone (FLONASE) 50 MCG/ACT nasal spray USE 2 SPRAYS DAILY IN BOTH NOSTRILS 32 mL 0  .  gabapentin (NEURONTIN) 100 MG capsule Take 200 mg by mouth 2 (two) times daily.  2  . hydrochlorothiazide (MICROZIDE) 12.5 MG capsule TAKE 1 CAPSULE BY MOUTH EVERY DAY 90 capsule 1  . HYDROcodone-acetaminophen (NORCO) 10-325 MG per tablet Take 1 tablet by mouth every 6 (six) hours as needed for moderate pain. 60 tablet 0  . imiquimod (ALDARA) 5 % cream Apply topically 3 (three) times a week. 24 each 2  . Ketorolac Tromethamine (SPRIX) 15.75 MG/SPRAY SOLN Place 1 spray into the nose every 6 (six) hours as needed (for pain).    [name] lidocaine (XYLOCAINE) 2 % solution 90 ml 2% lidocaine 90 ml dicyclomine 10mg /28ml 216ml of malox 450 mL 3  . nortriptyline (PAMELOR) 25  MG capsule Take 25 mg by mouth at bedtime.  0  . ondansetron (ZOFRAN) 4 MG tablet TAKE 1 TABLET (4 MG TOTAL) BY MOUTH EVERY 8 (EIGHT) HOURS AS NEEDED FOR NAUSEA OR VOMITING. 20 tablet 2  . oxybutynin (DITROPAN) 5 MG tablet Take 1 tablet (5 mg total) by mouth at bedtime. 90 tablet 3  . pantoprazole (PROTONIX) 40 MG tablet Take 1 tablet by mouth once daily 90 tablet 0  . sucralfate (CARAFATE) 1 g tablet Take 1 tablet (1 g total) by mouth 3 (three) times daily. Take 20 minutes after meals and at bedtime 120 tablet 1  . tamsulosin (FLOMAX) 0.4 MG CAPS capsule TAKE 1 CAPSULE (0.4 MG TOTAL) BY MOUTH TWICE DAILY 180 capsule 1  . timolol (TIMOPTIC) 0.5 % ophthalmic solution Place 1 drop into both eyes daily.  6  . tiZANidine (ZANAFLEX) 4 MG capsule TAKE 1 CAPSULE BY ORAL ROUTE 3 TIMES EVERY DAY (Patient taking differently: Take 4 mg by mouth 3 (three) times daily as needed for muscle spasms. ) 60 capsule 2  . tiZANidine (ZANAFLEX) 4 MG tablet TAKE 1 TABLET BY MOUTH EVERY 8 HOURS AS NEEDED(NOT TO EXCEED 3 DOSES IN 24 HOURS)    . tretinoin (RETIN-A) 0.1 % cream APPLY  CREAM TOPICALLY TO AFFECTED AREA OF FACE AT BEDTIME 40 g 1  . zolpidem (AMBIEN) 10 MG tablet TAKE 1 TABLET BY MOUTH AT BEDTIME AS NEEDED FOR SLEEP 90 tablet 1   No current facility-administered medications on file prior to visit.   Lab Results  Component Value Date   WBC 8.4 11/05/2019   HGB 13.5 11/05/2019   HCT 41.3 11/05/2019   PLT 352.0 11/05/2019   GLUCOSE 96 11/05/2019   CHOL 119 11/05/2019   TRIG 105.0 11/05/2019   HDL 51.90 11/05/2019   LDLDIRECT 100.0 09/02/2018   LDLCALC 46 11/05/2019   ALT 17 11/05/2019   AST 14 11/05/2019   NA 140 11/05/2019   K 4.2 11/05/2019   CL 101 11/05/2019   CREATININE 1.01 11/05/2019   BUN 7 11/05/2019   CO2 31 11/05/2019   TSH 1.33 11/05/2019   PSA 5.54 (H) 11/05/2019   INR 0.94 10/26/2018   HGBA1C 5.5 05/03/2019   A/P/next steps:   1) cough with GI symptoms - suggestive of covid; for  zofran prn and refer to [facility] for testing and tx  Mark Cower MD

## 2019-11-17 NOTE — Patient Instructions (Signed)
Ok to be off work [email or web address] - let us know if you need a work note  Please take all new medication as prescribed - the zofran for nausea  Ok to try OTC Immodium AD as needed  We will try to see if you can be referred to the [facility] to see if you may have COVID  Please continue all other medications as before, and refills have been done if requested.  Please have the pharmacy call with any other refills you may need.  Please keep your appointments with your specialists as you may have planned

## 2019-11-17 NOTE — Assessment & Plan Note (Signed)
See notes

## 2019-11-17 NOTE — Patient Instructions (Addendum)
Go to [facility] between 8am-4:30pm tomorrow for a chest x-ray.       COVID-19: Quarantine vs. Isolation QUARANTINE keeps someone who was in close contact with someone who has COVID-19 away from others. If you had close contact with a person who has COVID-19  Stay home until 14 days after your last contact.  Check your temperature twice a day and watch for symptoms of COVID-19.  If possible, stay away from people who are at higher-risk for getting very sick from COVID-19. ISOLATION keeps someone who is sick or tested positive for COVID-19 without symptoms away from others, even in their own home. If you are sick and think or know you have COVID-19  Stay home until after ? At least 10 days since symptoms first appeared and ? At least 24 hours with no fever without fever-reducing medication and ? Symptoms have improved If you tested positive for COVID-19 but do not have symptoms  Stay home until after ? 10 days have passed since your positive test If you live with others, stay in a specific "sick room" or area and away from other people or animals, including pets. Use a separate bathroom, if available. [email or web address] 05/03/2019 This information is not intended to replace advice given to you by your health care provider. Make sure you discuss any questions you have with your health care provider. Document Revised: 09/16/2019 Document Reviewed: 09/16/2019 Elsevier Patient Education  [place]. COVID-19 Frequently Asked Questions COVID-19 (coronavirus disease) is an infection that is caused by a large family of viruses. Some viruses cause illness in people and others cause illness in animals like camels, cats, and bats. In some cases, the viruses that cause illness in animals can spread to humans. Where did the coronavirus come from? In December 2019, [place] told the [facility]) of several cases of lung disease (human respiratory illness).  These cases were linked to an open seafood and livestock market in the city of [place]. The link to the seafood and livestock market suggests that the virus may have spread from animals to humans. However, since that first outbreak in December, the virus has also been shown to spread from person to person. What is the name of the disease and the virus? Disease name Early on, this disease was called novel coronavirus. This is because scientists determined that the disease was caused by a new (novel) respiratory virus. The World Health Organization [facility]) has now named the disease COVID-19, or coronavirus disease. Virus name The virus that causes the disease is called severe acute respiratory syndrome coronavirus 2 (SARS-CoV-2). More information on disease and virus naming World Health Organization [facility]): www.who.int/emergencies/diseases/novel-coronavirus-2019/technical-guidance/naming-the-coronavirus-disease-(covid-2019)-and-the-virus-that-causes-it Who is at risk for complications from coronavirus disease? Some people may be at higher risk for complications from coronavirus disease. This includes older adults and people who have chronic diseases, such as heart disease, diabetes, and lung disease. If you are at higher risk for complications, take these extra precautions:  Stay home as much as possible.  Avoid social gatherings and travel.  Avoid close contact with others. Stay at least 6 ft (2 m) away from others, if possible.  Wash your hands often with soap and water for at least 20 seconds.  Avoid touching your face, mouth, nose, or eyes.  Keep supplies on hand at home, such as food, medicine, and cleaning supplies.  If you must go out in public, wear a cloth face covering or face mask. Make sure your mask covers your nose  and mouth. How does coronavirus disease spread? The virus that causes coronavirus disease spreads easily from person to person (is contagious). You may catch the virus  by:  Breathing in droplets from an infected person. Droplets can be spread by a person breathing, speaking, singing, coughing, or sneezing.  Touching something, like a table or a doorknob, that was exposed to the virus (contaminated) and then touching your mouth, nose, or eyes. Can I get the virus from touching surfaces or objects? There is still a lot that we do not know about the virus that causes coronavirus disease. Scientists are basing a lot of information on what they know about similar viruses, such as:  Viruses cannot generally survive on surfaces for long. They need a human body (host) to survive.  It is more likely that the virus is spread by close contact with people who are sick (direct contact), such as through: ? Shaking hands or hugging. ? Breathing in respiratory droplets that travel through the air. Droplets can be spread by a person breathing, speaking, singing, coughing, or sneezing.  It is less likely that the virus is spread when a person touches a surface or object that has the virus on it (indirect contact). The virus may be able to enter the body if the person touches a surface or object and then touches his or her face, eyes, nose, or mouth. Can a person spread the virus without having symptoms of the disease? It may be possible for the virus to spread before a person has symptoms of the disease, but this is most likely not the main way the virus is spreading. It is more likely for the virus to spread by being in close contact with people who are sick and breathing in the respiratory droplets spread by a person breathing, speaking, singing, coughing, or sneezing. What are the symptoms of coronavirus disease? Symptoms vary from person to person and can range from mild to severe. Symptoms may include:  Fever or chills.  Cough.  Difficulty breathing or feeling short of breath.  Headaches, body aches, or muscle aches.  Runny or stuffy (congested) nose.  Sore  throat.  New loss of taste or smell.  Nausea, vomiting, or diarrhea. These symptoms can appear anywhere from 2 to 14 days after you have been exposed to the virus. Some people may not have any symptoms. If you develop symptoms, call your health care provider. People with severe symptoms may need hospital care. Should I be tested for this virus? Your health care provider will decide whether to test you based on your symptoms, history of exposure, and your risk factors. How does a health care provider test for this virus? Health care providers will collect samples to send for testing. Samples may include:  Taking a swab of fluid from the back of your nose and throat, your nose, or your throat.  Taking fluid from the lungs by having you cough up mucus (sputum) into a sterile cup.  Taking a blood sample. Is there a treatment or vaccine for this virus? Currently, there is no vaccine to prevent coronavirus disease. Also, there are no medicines like antibiotics or antivirals to treat the virus. A person who becomes sick is given supportive care, which means rest and fluids. A person may also relieve his or her symptoms by using over-the-counter medicines that treat sneezing, coughing, and runny nose. These are the same medicines that a person takes for the common cold. If you develop symptoms, call your health  care provider. People with severe symptoms may need hospital care. What can I do to protect myself and my family from this virus?     You can protect yourself and your family by taking the same actions that you would take to prevent the spread of other viruses. Take the following actions:  Wash your hands often with soap and water for at least 20 seconds. If soap and water are not available, use alcohol-based hand sanitizer.  Avoid touching your face, mouth, nose, or eyes.  Cough or sneeze into a tissue, sleeve, or elbow. Do not cough or sneeze into your hand or the air. ? If you cough  or sneeze into a tissue, throw it away immediately and wash your hands.  Disinfect objects and surfaces that you frequently touch every day.  Stay away from people who are sick.  Avoid going out in public, follow guidance from your state and local health authorities.  Avoid crowded indoor spaces. Stay at least 6 ft (2 m) away from others.  If you must go out in public, wear a cloth face covering or face mask. Make sure your mask covers your nose and mouth.  Stay home if you are sick, except to get medical care. Call your health care provider before you get medical care. Your health care provider will tell you how long to stay home.  Make sure your vaccines are up to date. Ask your health care provider what vaccines you need. What should I do if I need to travel? Follow travel recommendations from your local health authority, the CDC, and WHO. Travel information and advice  Centers for Disease Control and Prevention (CDC): BodyEditor.hu  World Health Organization [facility]): ThirdIncome.ca Know the risks and take action to protect your health  You are at higher risk of getting coronavirus disease if you are traveling to areas with an outbreak or if you are exposed to travelers from areas with an outbreak.  Wash your hands often and practice good hygiene to lower the risk of catching or spreading the virus. What should I do if I am sick? General instructions to stop the spread of infection  Wash your hands often with soap and water for at least 20 seconds. If soap and water are not available, use alcohol-based hand sanitizer.  Cough or sneeze into a tissue, sleeve, or elbow. Do not cough or sneeze into your hand or the air.  If you cough or sneeze into a tissue, throw it away immediately and wash your hands.  Stay home unless you must get medical care. Call your health care provider or local  health authority before you get medical care.  Avoid public areas. Do not take public transportation, if possible.  If you can, wear a mask if you must go out of the house or if you are in close contact with someone who is not sick. Make sure your mask covers your nose and mouth. Keep your home clean  Disinfect objects and surfaces that are frequently touched every day. This may include: ? Counters and tables. ? Doorknobs and light switches. ? Sinks and faucets. ? Electronics such as phones, remote controls, keyboards, computers, and tablets.  Wash dishes in hot, soapy water or use a dishwasher. Air-dry your dishes.  Wash laundry in hot water. Prevent infecting other household members  Let healthy household members care for children and pets, if possible. If you have to care for children or pets, wash your hands often and wear a mask.  Sleep in a different bedroom or bed, if possible.  Do not share personal items, such as razors, toothbrushes, deodorant, combs, brushes, towels, and washcloths. Where to find more information Centers for Disease Control and Prevention (CDC)  Information and news updates: https://www.butler-gonzalez.com/ World Health Organization [facility])  Information and news updates: MissExecutive.com.ee  Coronavirus health topic: https://www.castaneda.info/  Questions and answers on COVID-19: OpportunityDebt.at  Global tracker: who.sprinklr.com American Academy of Pediatrics (AAP)  Information for families: www.healthychildren.org/English/health-issues/conditions/chest-lungs/Pages/2019-Novel-Coronavirus.aspx The coronavirus situation is changing rapidly. Check your local health authority website or the CDC and [facility] websites for updates and news. When should I contact a health care provider?  Contact your health care provider if you have symptoms of an infection, such as fever or cough,  and you: ? Have been near anyone who is known to have coronavirus disease. ? Have come into contact with a person who is suspected to have coronavirus disease. ? Have traveled to an area where there is an outbreak of COVID-19. When should I get emergency medical care?  Get help right away by calling your local emergency services (911 in the [place] if you have: ? Trouble breathing. ? Pain or pressure in your chest. ? Confusion. ? Blue-tinged lips and fingernails. ? Difficulty waking from sleep. ? Symptoms that get worse. Let the emergency medical personnel know if you think you have coronavirus disease. Summary  A new respiratory virus is spreading from person to person and causing COVID-19 (coronavirus disease).  The virus that causes COVID-19 appears to spread easily. It spreads from one person to another through droplets from breathing, speaking, singing, coughing, or sneezing.  Older adults and those with chronic diseases are at higher risk of disease. If you are at higher risk for complications, take extra precautions.  There is currently no vaccine to prevent coronavirus disease. There are no medicines, such as antibiotics or antivirals, to treat the virus.  You can protect yourself and your family by washing your hands often, avoiding touching your face, and covering your coughs and sneezes. This information is not intended to replace advice given to you by your health care provider. Make sure you discuss any questions you have with your health care provider. Document Revised: 07/30/2019 Document Reviewed: 01/26/2019 Elsevier Patient Education  2020 [place]. COVID-19 COVID-19 is a respiratory infection that is caused by a virus called severe acute respiratory syndrome coronavirus 2 (SARS-CoV-2). The disease is also known as coronavirus disease or novel coronavirus. In some people, the virus may not cause any symptoms. In others, it may cause a serious infection. The infection  can get worse quickly and can lead to complications, such as:  Pneumonia, or infection of the lungs.  Acute respiratory distress syndrome or ARDS. This is a condition in which fluid build-up in the lungs prevents the lungs from filling with air and passing oxygen into the blood.  Acute respiratory failure. This is a condition in which there is not enough oxygen passing from the lungs to the body or when carbon dioxide is not passing from the lungs out of the body.  Sepsis or septic shock. This is a serious bodily reaction to an infection.  Blood clotting problems.  Secondary infections due to bacteria or fungus.  Organ failure. This is when your body's organs stop working. The virus that causes COVID-19 is contagious. This means that it can spread from person to person through droplets from coughs and sneezes (respiratory secretions). What are the causes? This illness is caused by a  virus. You may catch the virus by:  Breathing in droplets from an infected person. Droplets can be spread by a person breathing, speaking, singing, coughing, or sneezing.  Touching something, like a table or a doorknob, that was exposed to the virus (contaminated) and then touching your mouth, nose, or eyes. What increases the risk? Risk for infection You are more likely to be infected with this virus if you:  Are within 6 feet (2 meters) of a person with COVID-19.  Provide care for or live with a person who is infected with COVID-19.  Spend time in crowded indoor spaces or live in shared housing. Risk for serious illness You are more likely to become seriously ill from the virus if you:  Are 68 years of age or older. The higher your age, the more you are at risk for serious illness.  Live in a nursing home or long-term care facility.  Have cancer.  Have a long-term (chronic) disease such as: ? Chronic lung disease, including chronic obstructive pulmonary disease or asthma. ? A long-term disease  that lowers your body's ability to fight infection (immunocompromised). ? Heart disease, including heart failure, a condition in which the arteries that lead to the heart become narrow or blocked (coronary artery disease), a disease which makes the heart muscle thick, weak, or stiff (cardiomyopathy). ? Diabetes. ? Chronic kidney disease. ? Sickle cell disease, a condition in which red blood cells have an abnormal "sickle" shape. ? Liver disease.  Are obese. What are the signs or symptoms? Symptoms of this condition can range from mild to severe. Symptoms may appear any time from 2 to 14 days after being exposed to the virus. They include:  A fever or chills.  A cough.  Difficulty breathing.  Headaches, body aches, or muscle aches.  Runny or stuffy (congested) nose.  A sore throat.  New loss of taste or smell. Some people may also have stomach problems, such as nausea, vomiting, or diarrhea. Other people may not have any symptoms of COVID-19. How is this diagnosed? This condition may be diagnosed based on:  Your signs and symptoms, especially if: ? You live in an area with a COVID-19 outbreak. ? You recently traveled to or from an area where the virus is common. ? You provide care for or live with a person who was diagnosed with COVID-19. ? You were exposed to a person who was diagnosed with COVID-19.  A physical exam.  Lab tests, which may include: ? Taking a sample of fluid from the back of your nose and throat (nasopharyngeal fluid), your nose, or your throat using a swab. ? A sample of mucus from your lungs (sputum). ? Blood tests.  Imaging tests, which may include, X-rays, CT scan, or ultrasound. How is this treated? At present, there is no medicine to treat COVID-19. Medicines that treat other diseases are being used on a trial basis to see if they are effective against COVID-19. Your health care provider will talk with you about ways to treat your symptoms. For most  people, the infection is mild and can be managed at home with rest, fluids, and over-the-counter medicines. Treatment for a serious infection usually takes places in a hospital intensive care unit (ICU). It may include one or more of the following treatments. These treatments are given until your symptoms improve.  Receiving fluids and medicines through an IV.  Supplemental oxygen. Extra oxygen is given through a tube in the nose, a face mask, or a  hood.  Positioning you to lie on your stomach (prone position). This makes it easier for oxygen to get into the lungs.  Continuous positive airway pressure (CPAP) or bi-level positive airway pressure (BPAP) machine. This treatment uses mild air pressure to keep the airways open. A tube that is connected to a motor delivers oxygen to the body.  Ventilator. This treatment moves air into and out of the lungs by using a tube that is placed in your windpipe.  Tracheostomy. This is a procedure to create a hole in the neck so that a breathing tube can be inserted.  Extracorporeal membrane oxygenation (ECMO). This procedure gives the lungs a chance to recover by taking over the functions of the heart and lungs. It supplies oxygen to the body and removes carbon dioxide. Follow these instructions at home: Lifestyle  If you are sick, stay home except to get medical care. Your health care provider will tell you how long to stay home. Call your health care provider before you go for medical care.  Rest at home as told by your health care provider.  Do not use any products that contain nicotine or tobacco, such as cigarettes, e-cigarettes, and chewing tobacco. If you need help quitting, ask your health care provider.  Return to your normal activities as told by your health care provider. Ask your health care provider what activities are safe for you. General instructions  Take over-the-counter and prescription medicines only as told by your health care  provider.  Drink enough fluid to keep your urine pale yellow.  Keep all follow-up visits as told by your health care provider. This is important. How is this prevented?  There is no vaccine to help prevent COVID-19 infection. However, there are steps you can take to protect yourself and others from this virus. To protect yourself:   Do not travel to areas where COVID-19 is a risk. The areas where COVID-19 is reported change often. To identify high-risk areas and travel restrictions, check the CDC travel website: FatFares.com.br  If you live in, or must travel to, an area where COVID-19 is a risk, take precautions to avoid infection. ? Stay away from people who are sick. ? Wash your hands often with soap and water for 20 seconds. If soap and water are not available, use an alcohol-based hand sanitizer. ? Avoid touching your mouth, face, eyes, or nose. ? Avoid going out in public, follow guidance from your state and local health authorities. ? If you must go out in public, wear a cloth face covering or face mask. Make sure your mask covers your nose and mouth. ? Avoid crowded indoor spaces. Stay at least 6 feet (2 meters) away from others. ? Disinfect objects and surfaces that are frequently touched every day. This may include:  Counters and tables.  Doorknobs and light switches.  Sinks and faucets.  Electronics, such as phones, remote controls, keyboards, computers, and tablets. To protect others: If you have symptoms of COVID-19, take steps to prevent the virus from spreading to others.  If you think you have a COVID-19 infection, contact your health care provider right away. Tell your health care team that you think you may have a COVID-19 infection.  Stay home. Leave your house only to seek medical care. Do not use public transport.  Do not travel while you are sick.  Wash your hands often with soap and water for 20 seconds. If soap and water are not available, use  alcohol-based hand sanitizer.  Stay away from other members of your household. Let healthy household members care for children and pets, if possible. If you have to care for children or pets, wash your hands often and wear a mask. If possible, stay in your own room, separate from others. Use a different bathroom.  Make sure that all people in your household wash their hands well and often.  Cough or sneeze into a tissue or your sleeve or elbow. Do not cough or sneeze into your hand or into the air.  Wear a cloth face covering or face mask. Make sure your mask covers your nose and mouth. Where to find more information  Centers for Disease Control and Prevention: PurpleGadgets.be  World Health Organization: https://www.castaneda.info/ Contact a health care provider if:  You live in or have traveled to an area where COVID-19 is a risk and you have symptoms of the infection.  You have had contact with someone who has COVID-19 and you have symptoms of the infection. Get help right away if:  You have trouble breathing.  You have pain or pressure in your chest.  You have confusion.  You have bluish lips and fingernails.  You have difficulty waking from sleep.  You have symptoms that get worse. These symptoms may represent a serious problem that is an emergency. Do not wait to see if the symptoms will go away. Get medical help right away. Call your local emergency services (911 in the [place] Do not drive yourself to the hospital. Let the emergency medical personnel know if you think you have COVID-19. Summary  COVID-19 is a respiratory infection that is caused by a virus. It is also known as coronavirus disease or novel coronavirus. It can cause serious infections, such as pneumonia, acute respiratory distress syndrome, acute respiratory failure, or sepsis.  The virus that causes COVID-19 is contagious. This means that it can spread from person to  person through droplets from breathing, speaking, singing, coughing, or sneezing.  You are more likely to develop a serious illness if you are 75 years of age or older, have a weak immune system, live in a nursing home, or have chronic disease.  There is no medicine to treat COVID-19. Your health care provider will talk with you about ways to treat your symptoms.  Take steps to protect yourself and others from infection. Wash your hands often and disinfect objects and surfaces that are frequently touched every day. Stay away from people who are sick and wear a mask if you are sick. This information is not intended to replace advice given to you by your health care provider. Make sure you discuss any questions you have with your health care provider. Document Revised: 07/30/2019 Document Reviewed: 11/05/2018 Elsevier Patient Education  [place].

## 2019-11-18 ENCOUNTER — Telehealth: Payer: Self-pay

## 2019-11-18 NOTE — Telephone Encounter (Signed)
Patient calling and states that he was advised to go get a chest xray at [facility] long by the respiratory clinic. States that he was going to go today and has been asleep all day and cannot go now due to time. Would like to know if it is okay for him to go tomorrow morning? Please advise.

## 2019-11-19 ENCOUNTER — Ambulatory Visit (HOSPITAL_COMMUNITY)
Admission: RE | Admit: 2019-11-19 | Discharge: 2019-11-19 | Disposition: A | Discharge status: Home / Self Care | Payer: Medicare Other | Source: Ambulatory Visit | Attending: Nurse Practitioner | Admitting: Nurse Practitioner

## 2019-11-19 ENCOUNTER — Other Ambulatory Visit: Payer: Self-pay

## 2019-11-19 ENCOUNTER — Telehealth: Payer: Self-pay | Admitting: Internal Medicine

## 2019-11-19 DIAGNOSIS — R0689 Other abnormalities of breathing: Secondary | ICD-10-CM | POA: Diagnosis present

## 2019-11-19 LAB — NOVEL CORONAVIRUS, NAA: SARS-CoV-2, NAA: NOT DETECTED

## 2019-11-19 NOTE — Telephone Encounter (Signed)
Pls advise if ok to give work note.Marland KitchenJohny Dunlap

## 2019-11-19 NOTE — Telephone Encounter (Signed)
Ok but need to know dates he is requesting, as my guessing usually leads to more phone messages

## 2019-11-19 NOTE — Telephone Encounter (Signed)
Called pt there is no answer LMOM RTC../l;mb

## 2019-11-19 NOTE — Telephone Encounter (Signed)
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 Pt is wanting copy mailed to his address on file...Mark Dunlap

## 2019-11-19 NOTE — Telephone Encounter (Signed)
Yes, xray was done on 11/16/2018 but either way that was fine. Nothing further is needed. Will close encounter.

## 2019-11-19 NOTE — Telephone Encounter (Signed)
Patient came by the office to pick up letter, he was informed Dr.John is on lunch and will respone once he has time.   He is requesting the letter to be FAXED to [phone]

## 2019-11-19 NOTE — Telephone Encounter (Addendum)
   Patient states he will plan to return to work on 02/09. Not must also indicate COVID results negative Fax number provided in previous note

## 2019-11-19 NOTE — Telephone Encounter (Signed)
  Patient requesting a return to work note. Patient went to respiratory clinic, had covid test and can now return to work next week.   Please advise

## 2019-11-22 ENCOUNTER — Telehealth (INDEPENDENT_AMBULATORY_CARE_PROVIDER_SITE_OTHER): Payer: Self-pay | Admitting: Nurse Practitioner

## 2019-11-22 NOTE — Telephone Encounter (Signed)
I spoke with Mark Dunlap to follow up from his respiratory clinic visit. Patient states he is feeling better. Labs and imaging were reviewed. Patient encouraged to follow up with his PCP as needed.

## 2019-11-30 ENCOUNTER — Ambulatory Visit: Payer: Medicare Other | Attending: Internal Medicine

## 2019-11-30 ENCOUNTER — Other Ambulatory Visit: Payer: Self-pay

## 2019-11-30 DIAGNOSIS — Z23 Encounter for immunization: Secondary | ICD-10-CM

## 2019-11-30 NOTE — Progress Notes (Signed)
   Covid-19 Vaccination Clinic  Name:  KIONDRE GUZEK    MRN: [id number] DOB: 1960/11/17  11/30/2019  Mr. Peal was observed post Covid-19 immunization for 15 minutes without incidence. He was provided with Vaccine Information Sheet and instruction to access the V-Safe system.   Mr. Gunzenhauser was instructed to call 911 with any severe reactions post vaccine: [name] Difficulty breathing  . Swelling of your face and throat  . A fast heartbeat  . A bad rash all over your body  . Dizziness and weakness    Immunizations Administered    Name Date Dose VIS Date Route   Moderna COVID-19 Vaccine 11/30/2019  2:12 PM 0.5 mL 09/14/2019 Intramuscular   Manufacturer: Moderna   Lot: [id number]   [id number]

## 2019-12-03 ENCOUNTER — Ambulatory Visit: Payer: Medicare Other | Admitting: Podiatry

## 2019-12-03 ENCOUNTER — Other Ambulatory Visit: Payer: Self-pay

## 2019-12-03 ENCOUNTER — Encounter: Payer: Self-pay | Admitting: Podiatry

## 2019-12-03 ENCOUNTER — Ambulatory Visit (INDEPENDENT_AMBULATORY_CARE_PROVIDER_SITE_OTHER): Payer: Medicare Other

## 2019-12-03 VITALS — Temp 97.8°F

## 2019-12-03 DIAGNOSIS — M19072 Primary osteoarthritis, left ankle and foot: Secondary | ICD-10-CM | POA: Diagnosis not present

## 2019-12-03 DIAGNOSIS — M79671 Pain in right foot: Secondary | ICD-10-CM

## 2019-12-03 DIAGNOSIS — M19071 Primary osteoarthritis, right ankle and foot: Secondary | ICD-10-CM | POA: Diagnosis not present

## 2019-12-03 DIAGNOSIS — M79672 Pain in left foot: Secondary | ICD-10-CM | POA: Diagnosis not present

## 2019-12-03 NOTE — Progress Notes (Signed)
Subjective:  Patient ID: Mark Dunlap, male    DOB: May 11, 1961,  MRN: [id number]  Chief Complaint  Patient presents with  . Foot Pain    Bilateral dorsal forefoot, submet 1. x"Months to a year". Pt stated, "I wonder if this started because I wear steel-toed boots for work. They have been swollen. Pain = 9/10 most of the time".    59 y.o. male presents with the above complaint.  Patient presents with bilateral first metatarsophalangeal joint pain that has progressively gotten worse over the last year.  Patient states the pain is right at the first MPJ bilaterally.  His pain is more significant on the left side than right side.  He states that he ambulates with steel toed boots for work and it has progressive gotten swollen and painful.  His pain scale is 9 out of 10.  He has not tried anything over-the-counter for pain control.  He states that he has tried insoles which has not helped.  He has not tried injection.  He has not seen anyone else for this.  He denies any other acute complaints.   Review of Systems: Negative except as noted in the HPI. Denies N/V/F/Ch.  Past Medical History:  Diagnosis Date  . Acute prostatitis 05/23/2009  . Adjustment disorder with depressed mood 09/2007  . Cataract    bilateral  . CERVICAL RADICULOPATHY, LEFT 08/30/2008  . CVA (cerebrovascular accident) ([place])   . DJD (degenerative joint disease) of hip   . DJD (degenerative joint disease) of knee    left knee  . GERD (gastroesophageal reflux disease)   . 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 H/O: substance abuse ([place])    hx of ETOH/Crack cocaine-none since 11/08 per pt/Does not Drive due to this  . HYPERTENSION 08/30/2008  . HYPERTHYROIDISM 02/13/2010   pt was told by  Dr Jenny Reichmann  that thyroid was now back to normal .. 2012 ...   . HYPOTENSION 10/31/2009  . 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  Other and unspecified hyperlipidemia 08/20/2013  . PONV (postoperative nausea and vomiting)   . Seasonal allergies   . SPINAL STENOSIS, CERVICAL 08/08/2009  . Stroke ([place])   . THYROID NODULE, RIGHT 03/02/2010    Current Outpatient Medications:  .  acetaminophen (TYLENOL) 500 MG tablet, Take 1,000 mg by mouth every 6 (six) hours as needed for moderate pain or headache., Disp: , Rfl:  .  adapalene (DIFFERIN) 0.1 % gel, APPLY 1 APPLICATION TOPICALLY AT BEDTIME., Disp: 45 g, Rfl: 1 .  AMBULATORY NON FORMULARY MEDICATION, Medication Name: GI Cocktail 90 ml-Viscous lidocaine 90 ml-Dicyclomine 10 mg/71ml 270 ml  Maalox Total ml's 450 ml Take 5-10 ml's and swallow as needed for breakthrough., Disp: 450 mL, Rfl: 2 .  atorvastatin (LIPITOR) 40 MG tablet, TAKE 1 TABLET BY MOUTH ONCE DAILY AT 6PM, Disp: 180 tablet, Rfl: 1 .  benzoyl peroxide (BENZOYL PEROXIDE) 5 % external liquid, Apply 1 application topically 2 (two) times daily., Disp: 226 g, Rfl: 0 .  bimatoprost (LUMIGAN) 0.01 % SOLN, Place 1 drop into both eyes at bedtime., Disp: , Rfl:  .  citalopram (CELEXA) 20 MG tablet, Take 1 tablet (20 mg total) by mouth daily., Disp: 90 tablet, Rfl: 3 .  clopidogrel (PLAVIX) 75 MG tablet, Take 1 tablet by mouth once daily, Disp: 180 tablet, Rfl: 0 .  diphenhydrAMINE (BENADRYL) 25 mg capsule, Take 50 mg by mouth every 6 (six) hours as needed for itching. , Disp: , Rfl:  .  famotidine (PEPCID) 40 MG tablet, Take 1 tablet (40 mg total) by mouth 2 (two) times daily. Take every morning and at bedtime, Disp: 60 tablet, Rfl: 3 .  fluticasone (FLONASE) 50 MCG/ACT nasal spray, USE 2 SPRAYS DAILY IN BOTH NOSTRILS, Disp: 32 mL, Rfl: 0 .  gabapentin (NEURONTIN) 100 MG capsule, Take 200 mg by mouth 2 (two) times daily., Disp: , Rfl: 2 .  hydrochlorothiazide (MICROZIDE) 12.5 MG capsule, TAKE 1 CAPSULE BY MOUTH EVERY DAY, Disp: 90 capsule, Rfl: 1 .  HYDROcodone-acetaminophen (NORCO) 10-325 MG per tablet, Take 1 tablet by mouth  every 6 (six) hours as needed for moderate pain., Disp: 60 tablet, Rfl: 0 .  imiquimod (ALDARA) 5 % cream, Apply topically 3 (three) times a week., Disp: 24 each, Rfl: 2 .  Ketorolac Tromethamine (SPRIX) 15.75 MG/SPRAY SOLN, Place 1 spray into the nose every 6 (six) hours as needed (for pain)., Disp: , Rfl:  .  lidocaine (XYLOCAINE) 2 % solution, 90 ml 2% lidocaine 90 ml dicyclomine 10mg /54ml 238ml of malox, Disp: 450 mL, Rfl: 3 .  nortriptyline (PAMELOR) 25 MG capsule, Take 25 mg by mouth at bedtime., Disp: , Rfl: 0 .  ondansetron (ZOFRAN) 4 MG tablet, TAKE 1 TABLET (4 MG TOTAL) BY MOUTH EVERY 8 (EIGHT) HOURS AS NEEDED FOR NAUSEA OR VOMITING., Disp: 20 tablet, Rfl: 2 .  oxybutynin (DITROPAN) 5 MG tablet, Take 1 tablet (5 mg total) by mouth at bedtime., Disp: 90 tablet, Rfl: 3 .  pantoprazole (PROTONIX) 40 MG tablet, Take 1 tablet by mouth once daily, Disp: 90 tablet, Rfl: 0 .  sucralfate (CARAFATE) 1 g tablet, Take 1 tablet (1 g total) by mouth 3 (three) times daily. Take 20 minutes after meals and at bedtime, Disp: 120 tablet, Rfl: 1 .  tamsulosin (FLOMAX) 0.4 MG CAPS capsule, TAKE 1 CAPSULE (0.4 MG TOTAL) BY MOUTH TWICE DAILY, Disp: 180 capsule, Rfl: 1 .  timolol (TIMOPTIC) 0.5 % ophthalmic solution, Place 1 drop into both eyes daily., Disp: , Rfl: 6 .  tiZANidine (ZANAFLEX) 4 MG tablet, TAKE 1 TABLET BY MOUTH EVERY 8 HOURS AS NEEDED(NOT TO EXCEED 3 DOSES IN 24 HOURS), Disp: , Rfl:  .  tretinoin (RETIN-A) 0.1 % cream, APPLY  CREAM TOPICALLY TO AFFECTED AREA OF FACE AT BEDTIME, Disp: 40 g, Rfl: 1 .  zolpidem (AMBIEN) 10 MG tablet, TAKE 1 TABLET BY MOUTH AT BEDTIME AS NEEDED FOR SLEEP, Disp: 90 tablet, Rfl: 1  Social History   Tobacco Use  Smoking Status Current Every Day Smoker  . Packs/day: 0.50  . Years: 40.00  . Pack years: 20.00  . Types: Cigarettes  Smokeless Tobacco Never Used    Allergies  Allergen Reactions  . Tramadol Itching  . Morphine And Related Itching  . Oxycodone  Itching   Objective:   Vitals:   12/03/19 0930  Temp: 97.8 F (36.6 C)   There is no height or weight on file to calculate BMI. Constitutional Well developed. Well nourished.  Vascular Dorsalis pedis pulses palpable bilaterally. Posterior tibial pulses palpable bilaterally. Capillary refill normal to all digits.  No cyanosis or clubbing noted. Pedal hair growth normal.  Neurologic Normal speech. Oriented to person, place, and time. Epicritic sensation to light touch grossly present bilaterally.  Dermatologic Nails well groomed and normal in appearance. No open wounds. No skin lesions.  Orthopedic:  Limited range of motion noted of bilateral first metatarsophalangeal joint with left more painful than right.  Intra-articular pain noted bilaterally.  Pain with end range of motion.  Hallux limitus noted bilaterally.   Radiographs: 2 views of bilaterally skeletally mature adult foot: Severe arthritic changes noted to the right side.  Uneven joint space narrowing noted to the left side.  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  No other bony abnormalities identified mild arthritic changes noted to the dorsal aspect of the midfoot.  Mild elevatus present bilaterally. Assessment:   1. Bilateral foot pain   2. Arthritis of first metatarsophalangeal (MTP) joint of right foot   3. Arthritis of first metatarsophalangeal (MTP) joint of left foot    Plan:  Patient was evaluated and treated and all questions answered.  Bilateral first metatarsophalangeal joint arthritis with left more painful than right -I explained to the patient etiology of the first metatarsophalangeal joint and all the various treatment options associated with it.  I believe patient will benefit from a steroid injection to help decrease the acute inflammation at the joint level.  Clinically his left is more severe than right however  radiographically the right is more severe than the left.  At this time I will address the clinical component of the pain as opposed to radiographic component.  Patient states understanding and will would like to proceed with bilateral first metatarsophalangeal joint injection. -A steroid injection was performed at bilateral first metatarsophalangeal joint using 1% plain Lidocaine and 10 mg of Kenalog. This was well tolerated. -If no improvement in pain with steroid injection or, I will discuss surgical intervention during next office visit. Return in about 4 weeks (around 12/31/2019).

## 2019-12-07 ENCOUNTER — Other Ambulatory Visit: Payer: Self-pay | Admitting: Podiatry

## 2019-12-07 ENCOUNTER — Encounter: Payer: Self-pay | Admitting: Podiatry

## 2019-12-07 DIAGNOSIS — M19071 Primary osteoarthritis, right ankle and foot: Secondary | ICD-10-CM

## 2019-12-07 DIAGNOSIS — M19072 Primary osteoarthritis, left ankle and foot: Secondary | ICD-10-CM

## 2019-12-13 ENCOUNTER — Telehealth: Payer: Self-pay

## 2019-12-13 NOTE — Telephone Encounter (Signed)
New message    The patient calling asking the nurse to call him to discuss name of medication that dissolves in him mouth help with nausea     1.Medication Requested:ondansetron (ZOFRAN) 4 MG tablet   2. Pharmacy (Name, Street, [place]): [facility] on [address], [place]   3. On Med List: Yes   4. Last Visit with PCP:  2.3.2021   5. Next visit date with PCP: 7.20.2021

## 2019-12-15 MED ORDER — ONDANSETRON 4 MG PO TBDP: 4.0000 mg | ORAL_TABLET | Freq: Three times a day (TID) | ORAL | 1 refills | Status: DC | PRN

## 2019-12-15 NOTE — Telephone Encounter (Signed)
Done erx 

## 2019-12-29 ENCOUNTER — Ambulatory Visit: Payer: Medicare Other | Attending: Internal Medicine

## 2019-12-29 DIAGNOSIS — Z23 Encounter for immunization: Secondary | ICD-10-CM

## 2019-12-29 NOTE — Progress Notes (Signed)
   Covid-19 Vaccination Clinic  Name:  Mark Dunlap    MRN: [id number] DOB: 1961-07-02  12/29/2019  Mark Dunlap was observed post Covid-19 immunization for 30 minutes based on pre-vaccination screening without incident. He was provided with Vaccine Information Sheet and instruction to access the V-Safe system.   Mark Dunlap was instructed to call 911 with any severe reactions post vaccine: [name] Difficulty breathing  . Swelling of face and throat  . A fast heartbeat  . A bad rash all over body  . Dizziness and weakness   Immunizations Administered    Name Date Dose VIS Date Route   Moderna COVID-19 Vaccine 12/29/2019 12:20 PM 0.5 mL 09/14/2019 Intramuscular   Manufacturer: Moderna   Lot: [id number]   [place]VO:[id number]

## 2019-12-31 ENCOUNTER — Ambulatory Visit: Payer: Medicare Other | Admitting: Podiatry

## 2019-12-31 ENCOUNTER — Other Ambulatory Visit: Payer: Self-pay

## 2019-12-31 DIAGNOSIS — M79671 Pain in right foot: Secondary | ICD-10-CM | POA: Diagnosis not present

## 2019-12-31 DIAGNOSIS — M19071 Primary osteoarthritis, right ankle and foot: Secondary | ICD-10-CM | POA: Diagnosis not present

## 2019-12-31 DIAGNOSIS — M19079 Primary osteoarthritis, unspecified ankle and foot: Secondary | ICD-10-CM

## 2019-12-31 DIAGNOSIS — M79672 Pain in left foot: Secondary | ICD-10-CM

## 2019-12-31 DIAGNOSIS — M19072 Primary osteoarthritis, left ankle and foot: Secondary | ICD-10-CM | POA: Diagnosis not present

## 2020-01-02 ENCOUNTER — Other Ambulatory Visit: Payer: Self-pay | Admitting: Internal Medicine

## 2020-01-02 NOTE — Telephone Encounter (Signed)
Please refill as per office routine med refill policy (all routine meds refilled for 3 mo or monthly per pt preference up to one year from last visit, then month to month grace period for 3 mo, then further med refills will have to be denied)  

## 2020-01-04 ENCOUNTER — Encounter: Payer: Self-pay | Admitting: Podiatry

## 2020-01-04 NOTE — Progress Notes (Signed)
Subjective:  Patient ID: Mark Dunlap, male    DOB: 1960-11-01,  MRN: [id number]  Chief Complaint  Patient presents with  . Ingrown Toenail    pt states that the arthritis of both feet is still painful, pt puts pain scale as a 7 out of 10 on the pain scale.     59 y.o. male presents with the above complaint.  Patient presents with a follow-up of bilateral first metatarsophalangeal phalangeal joint pain.  Patient states the injection got him over 50% of pain.  Patient states that there is still some pain associated with it.  He states the injection helped a lot.  He denies any other acute complaints.  He states his pain scale is 7 out of 10 now.  Used to be a lot worse.  He denies doing any other treatment options.   Review of Systems: Negative except as noted in the HPI. Denies N/V/F/Ch.  Past Medical History:  Diagnosis Date  . Acute prostatitis 05/23/2009  . Adjustment disorder with depressed mood 09/2007  . Cataract    bilateral  . CERVICAL RADICULOPATHY, LEFT 08/30/2008  . CVA (cerebrovascular accident) ([place])   . DJD (degenerative joint disease) of hip   . DJD (degenerative joint disease) of knee    left knee  . GERD (gastroesophageal reflux disease)   . 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 H/O: substance abuse ([place])    hx of ETOH/Crack cocaine-none since 11/08 per pt/Does not Drive due to this  . HYPERTENSION 08/30/2008  . HYPERTHYROIDISM 02/13/2010   pt was told by  Dr Jenny Reichmann  that thyroid was now back to normal .. 2012 ...   . HYPOTENSION 10/31/2009  . 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 Other and unspecified hyperlipidemia 08/20/2013  . PONV (postoperative nausea and vomiting)   . Seasonal allergies   . SPINAL STENOSIS, CERVICAL 08/08/2009  . Stroke ([place])   . THYROID NODULE, RIGHT 03/02/2010    Current Outpatient Medications:  .  acetaminophen (TYLENOL) 500  MG tablet, Take 1,000 mg by mouth every 6 (six) hours as needed for moderate pain or headache., Disp: , Rfl:  .  adapalene (DIFFERIN) 0.1 % gel, APPLY 1 APPLICATION TOPICALLY AT BEDTIME., Disp: 45 g, Rfl: 1 .  AMBULATORY NON FORMULARY MEDICATION, Medication Name: GI Cocktail 90 ml-Viscous lidocaine 90 ml-Dicyclomine 10 mg/57ml 270 ml  Maalox Total ml's 450 ml Take 5-10 ml's and swallow as needed for breakthrough., Disp: 450 mL, Rfl: 2 .  atorvastatin (LIPITOR) 40 MG tablet, TAKE 1 TABLET BY MOUTH ONCE DAILY AT 6PM, Disp: 180 tablet, Rfl: 1 .  benzoyl peroxide (BENZOYL PEROXIDE) 5 % external liquid, Apply 1 application topically 2 (two) times daily., Disp: 226 g, Rfl: 0 .  bimatoprost (LUMIGAN) 0.01 % SOLN, Place 1 drop into both eyes at bedtime., Disp: , Rfl:  .  citalopram (CELEXA) 20 MG tablet, Take 1 tablet (20 mg total) by mouth daily., Disp: 90 tablet, Rfl: 3 .  clopidogrel (PLAVIX) 75 MG tablet, Take 1 tablet by mouth once daily, Disp: 180 tablet, Rfl: 0 .  diphenhydrAMINE (BENADRYL) 25 mg capsule, Take 50 mg by mouth every 6 (six) hours as needed for itching. , Disp: , Rfl:  .  famotidine (PEPCID) 40 MG tablet, Take 1 tablet (40 mg total) by mouth 2 (two) times daily. Take every morning  and at bedtime, Disp: 60 tablet, Rfl: 3 .  fluticasone (FLONASE) 50 MCG/ACT nasal spray, USE 2 SPRAYS DAILY IN BOTH NOSTRILS, Disp: 32 mL, Rfl: 0 .  gabapentin (NEURONTIN) 100 MG capsule, Take 200 mg by mouth 2 (two) times daily., Disp: , Rfl: 2 .  HYDROcodone-acetaminophen (NORCO) 10-325 MG per tablet, Take 1 tablet by mouth every 6 (six) hours as needed for moderate pain., Disp: 60 tablet, Rfl: 0 .  imiquimod (ALDARA) 5 % cream, Apply topically 3 (three) times a week., Disp: 24 each, Rfl: 2 .  Ketorolac Tromethamine (SPRIX) 15.75 MG/SPRAY SOLN, Place 1 spray into the nose every 6 (six) hours as needed (for pain)., Disp: , Rfl:  .  lidocaine (XYLOCAINE) 2 % solution, 90 ml 2% lidocaine 90 ml dicyclomine 10mg /63ml  260ml of malox, Disp: 450 mL, Rfl: 3 .  nortriptyline (PAMELOR) 25 MG capsule, Take 25 mg by mouth at bedtime., Disp: , Rfl: 0 .  ondansetron (ZOFRAN ODT) 4 MG disintegrating tablet, Take 1 tablet (4 mg total) by mouth every 8 (eight) hours as needed for nausea or vomiting., Disp: 20 tablet, Rfl: 1 .  oxybutynin (DITROPAN) 5 MG tablet, Take 1 tablet (5 mg total) by mouth at bedtime., Disp: 90 tablet, Rfl: 3 .  pantoprazole (PROTONIX) 40 MG tablet, Take 1 tablet by mouth once daily, Disp: 90 tablet, Rfl: 0 .  sucralfate (CARAFATE) 1 g tablet, Take 1 tablet (1 g total) by mouth 3 (three) times daily. Take 20 minutes after meals and at bedtime, Disp: 120 tablet, Rfl: 1 .  tamsulosin (FLOMAX) 0.4 MG CAPS capsule, TAKE 1 CAPSULE (0.4 MG TOTAL) BY MOUTH TWICE DAILY, Disp: 180 capsule, Rfl: 1 .  timolol (TIMOPTIC) 0.5 % ophthalmic solution, Place 1 drop into both eyes daily., Disp: , Rfl: 6 .  tiZANidine (ZANAFLEX) 4 MG tablet, TAKE 1 TABLET BY MOUTH EVERY 8 HOURS AS NEEDED(NOT TO EXCEED 3 DOSES IN 24 HOURS), Disp: , Rfl:  .  tretinoin (RETIN-A) 0.1 % cream, APPLY  CREAM TOPICALLY TO AFFECTED AREA OF FACE AT BEDTIME, Disp: 40 g, Rfl: 1 .  zolpidem (AMBIEN) 10 MG tablet, TAKE 1 TABLET BY MOUTH AT BEDTIME AS NEEDED FOR SLEEP, Disp: 90 tablet, Rfl: 1 .  hydrochlorothiazide (MICROZIDE) 12.5 MG capsule, TAKE 1 CAPSULE BY MOUTH EVERY DAY, Disp: 90 capsule, Rfl: 1  Social History   Tobacco Use  Smoking Status Current Every Day Smoker  . Packs/day: 0.50  . Years: 40.00  . Pack years: 20.00  . Types: Cigarettes  Smokeless Tobacco Never Used    Allergies  Allergen Reactions  . Tramadol Itching  . Morphine And Related Itching  . Oxycodone Itching   Objective:   There were no vitals filed for this visit. There is no height or weight on file to calculate BMI. Constitutional Well developed. Well nourished.  Vascular Dorsalis pedis pulses palpable bilaterally. Posterior tibial pulses palpable  bilaterally. Capillary refill normal to all digits.  No cyanosis or clubbing noted. Pedal hair growth normal.  Neurologic Normal speech. Oriented to person, place, and time. Epicritic sensation to light touch grossly present bilaterally.  Dermatologic Nails well groomed and normal in appearance. No open wounds. No skin lesions.  Orthopedic:  Limited range of motion noted of bilateral first metatarsophalangeal joint with left more painful than right.  Intra-articular pain noted bilaterally.  Pain with end range of motion.  Hallux limitus noted bilaterally.   Radiographs: 2 views of bilaterally skeletally mature adult foot: Severe arthritic changes  noted to the right side.  Uneven joint space narrowing noted to the left side.  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  No other bony abnormalities identified mild arthritic changes noted to the dorsal aspect of the midfoot.  Mild elevatus present bilaterally. Assessment:   1. Bilateral foot pain   2. Arthritis of first metatarsophalangeal (MTP) joint of right foot   3. Arthritis of first metatarsophalangeal (MTP) joint of left foot    Plan:  Patient was evaluated and treated and all questions answered.  Bilateral first metatarsophalangeal joint arthritis with left more painful than right -Given clinically patient is improving and the pain is decreasing with steroid injection I believe he will benefit from another steroid injection to bilateral first metatarsophalangeal joint to help him get to closer to 95% or greater in terms of pain relief.  Patient states understanding would like to proceed with a second injection to bilateral first metatarsophalangeal joint. -A steroid injection was performed at bilateral first metatarsophalangeal joint using 1% plain Lidocaine and 10 mg of Kenalog. This was well tolerated. -I will also discussed with him orthotics management with  Morton's extension take some of the stress off of the first metatarsophalangeal joint. -If no improvement in pain with steroid injection or, I will discuss surgical intervention during next office visit. No follow-ups on file.

## 2020-01-06 ENCOUNTER — Other Ambulatory Visit: Payer: Self-pay | Admitting: Internal Medicine

## 2020-01-12 ENCOUNTER — Telehealth: Payer: Self-pay

## 2020-01-13 ENCOUNTER — Other Ambulatory Visit: Payer: Self-pay | Admitting: Internal Medicine

## 2020-01-17 ENCOUNTER — Other Ambulatory Visit: Payer: Self-pay | Admitting: Internal Medicine

## 2020-01-17 NOTE — Telephone Encounter (Signed)
Faxed and confirmed request for Compound medication to [place]

## 2020-02-07 ENCOUNTER — Other Ambulatory Visit: Payer: Self-pay | Admitting: Internal Medicine

## 2020-02-11 ENCOUNTER — Ambulatory Visit: Payer: Medicare Other | Admitting: Podiatry

## 2020-02-11 ENCOUNTER — Other Ambulatory Visit: Payer: Self-pay

## 2020-02-11 DIAGNOSIS — M19071 Primary osteoarthritis, right ankle and foot: Secondary | ICD-10-CM | POA: Diagnosis not present

## 2020-02-11 DIAGNOSIS — M19072 Primary osteoarthritis, left ankle and foot: Secondary | ICD-10-CM | POA: Diagnosis not present

## 2020-02-14 ENCOUNTER — Other Ambulatory Visit: Payer: Self-pay | Admitting: Internal Medicine

## 2020-02-15 ENCOUNTER — Encounter: Payer: Self-pay | Admitting: Podiatry

## 2020-02-15 NOTE — Progress Notes (Signed)
Subjective:  Patient ID: Mark Dunlap, male    DOB: 05-16-1961,  MRN: [id number]  Chief Complaint  Patient presents with  . Foot Pain    pt is here for bil foot pain, pt states that his foot pain has come back and is looking to get it treated    59 y.o. male presents with the above complaint.  Patient presents with a follow-up of bilateral first metatarsophalangeal phalangeal joint pain.  Patient states the injection has definitely helped him.  His pain is still there but has not quite improved.  He had received 2 injections so far from it.  I believe he will benefit from 1/3 injection however it appears that the frequency of the injections are becoming more constant.  If no resolve meant with the final injection will definitely need to consider surgical intervention.  He has been ambulating with regular sneakers denies any other acute complaints   Review of Systems: Negative except as noted in the HPI. Denies N/V/F/Ch.  Past Medical History:  Diagnosis Date  . Acute prostatitis 05/23/2009  . Adjustment disorder with depressed mood 09/2007  . Cataract    bilateral  . CERVICAL RADICULOPATHY, LEFT 08/30/2008  . CVA (cerebrovascular accident) ([place])   . DJD (degenerative joint disease) of hip   . DJD (degenerative joint disease) of knee    left knee  . GERD (gastroesophageal reflux disease)   . 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 H/O: substance abuse ([place])    hx of ETOH/Crack cocaine-none since 11/08 per pt/Does not Drive due to this  . HYPERTENSION 08/30/2008  . HYPERTHYROIDISM 02/13/2010   pt was told by  Dr Jenny Reichmann  that thyroid was now back to normal .. 2012 ...   . HYPOTENSION 10/31/2009  . 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 Other and unspecified hyperlipidemia 08/20/2013  . PONV (postoperative nausea and vomiting)   . Seasonal allergies   . SPINAL STENOSIS, CERVICAL  08/08/2009  . Stroke ([place])   . THYROID NODULE, RIGHT 03/02/2010    Current Outpatient Medications:  .  acetaminophen (TYLENOL) 500 MG tablet, Take 1,000 mg by mouth every 6 (six) hours as needed for moderate pain or headache., Disp: , Rfl:  .  adapalene (DIFFERIN) 0.1 % gel, APPLY 1 APPLICATION TOPICALLY AT BEDTIME., Disp: 45 g, Rfl: 1 .  AMBULATORY NON FORMULARY MEDICATION, Medication Name: GI Cocktail 90 ml-Viscous lidocaine 90 ml-Dicyclomine 10 mg/35ml 270 ml  Maalox Total ml's 450 ml Take 5-10 ml's and swallow as needed for breakthrough., Disp: 450 mL, Rfl: 2 .  atorvastatin (LIPITOR) 40 MG tablet, TAKE 1 TABLET BY MOUTH ONCE DAILY AT 6PM, Disp: 180 tablet, Rfl: 1 .  benzoyl peroxide (BENZOYL PEROXIDE) 5 % external liquid, Apply 1 application topically 2 (two) times daily., Disp: 226 g, Rfl: 0 .  bimatoprost (LUMIGAN) 0.01 % SOLN, Place 1 drop into both eyes at bedtime., Disp: , Rfl:  .  citalopram (CELEXA) 20 MG tablet, Take 1 tablet (20 mg total) by mouth daily., Disp: 90 tablet, Rfl: 3 .  clopidogrel (PLAVIX) 75 MG tablet, Take 1 tablet by mouth once daily, Disp: 90 tablet, Rfl: 0 .  diphenhydrAMINE (BENADRYL) 25 mg capsule, Take 50 mg by mouth every 6 (six) hours as needed for itching. , Disp: , Rfl:  .  famotidine (PEPCID) 40 MG tablet, Take 1  tablet (40 mg total) by mouth 2 (two) times daily. Take every morning and at bedtime, Disp: 60 tablet, Rfl: 3 .  fluticasone (FLONASE) 50 MCG/ACT nasal spray, USE 2 SPRAYS DAILY IN BOTH NOSTRILS, Disp: 32 mL, Rfl: 0 .  gabapentin (NEURONTIN) 100 MG capsule, Take 200 mg by mouth 2 (two) times daily., Disp: , Rfl: 2 .  hydrochlorothiazide (MICROZIDE) 12.5 MG capsule, TAKE 1 CAPSULE BY MOUTH EVERY DAY, Disp: 90 capsule, Rfl: 1 .  HYDROcodone-acetaminophen (NORCO) 10-325 MG per tablet, Take 1 tablet by mouth every 6 (six) hours as needed for moderate pain., Disp: 60 tablet, Rfl: 0 .  imiquimod (ALDARA) 5 % cream, Apply topically 3 (three) times a week.,  Disp: 24 each, Rfl: 2 .  Ketorolac Tromethamine (SPRIX) 15.75 MG/SPRAY SOLN, Place 1 spray into the nose every 6 (six) hours as needed (for pain)., Disp: , Rfl:  .  lidocaine (XYLOCAINE) 2 % solution, TAKE AS DIRECTED, Disp: 450 mL, Rfl: 0 .  nortriptyline (PAMELOR) 25 MG capsule, Take 25 mg by mouth at bedtime., Disp: , Rfl: 0 .  ondansetron (ZOFRAN ODT) 4 MG disintegrating tablet, Take 1 tablet (4 mg total) by mouth every 8 (eight) hours as needed for nausea or vomiting., Disp: 20 tablet, Rfl: 1 .  oxybutynin (DITROPAN) 5 MG tablet, Take 1 tablet (5 mg total) by mouth at bedtime., Disp: 90 tablet, Rfl: 3 .  predniSONE (STERAPRED UNI-PAK 21 TAB) 10 MG (21) TBPK tablet, , Disp: , Rfl:  .  sucralfate (CARAFATE) 1 g tablet, Take 1 tablet (1 g total) by mouth 3 (three) times daily. Take 20 minutes after meals and at bedtime, Disp: 120 tablet, Rfl: 1 .  tamsulosin (FLOMAX) 0.4 MG CAPS capsule, Take 1 capsule by mouth twice daily, Disp: 180 capsule, Rfl: 2 .  timolol (TIMOPTIC) 0.5 % ophthalmic solution, Place 1 drop into both eyes daily., Disp: , Rfl: 6 .  tiZANidine (ZANAFLEX) 4 MG tablet, TAKE 1 TABLET BY MOUTH EVERY 8 HOURS AS NEEDED(NOT TO EXCEED 3 DOSES IN 24 HOURS), Disp: , Rfl:  .  tretinoin (RETIN-A) 0.1 % cream, APPLY  CREAM TOPICALLY TO AFFECTED AREA OF FACE AT BEDTIME, Disp: 40 g, Rfl: 1 .  zolpidem (AMBIEN) 10 MG tablet, TAKE 1 TABLET BY MOUTH AT BEDTIME AS NEEDED FOR SLEEP, Disp: 90 tablet, Rfl: 1 .  pantoprazole (PROTONIX) 40 MG tablet, Take 1 tablet by mouth once daily, Disp: 90 tablet, Rfl: 2  Social History   Tobacco Use  Smoking Status Current Every Day Smoker  . Packs/day: 0.50  . Years: 40.00  . Pack years: 20.00  . Types: Cigarettes  Smokeless Tobacco Never Used    Allergies  Allergen Reactions  . Tramadol Itching  . Morphine And Related Itching  . Oxycodone Itching   Objective:   There were no vitals filed for this visit. There is no height or weight on file to  calculate BMI. Constitutional Well developed. Well nourished.  Vascular Dorsalis pedis pulses palpable bilaterally. Posterior tibial pulses palpable bilaterally. Capillary refill normal to all digits.  No cyanosis or clubbing noted. Pedal hair growth normal.  Neurologic Normal speech. Oriented to person, place, and time. Epicritic sensation to light touch grossly present bilaterally.  Dermatologic Nails well groomed and normal in appearance. No open wounds. No skin lesions.  Orthopedic:  Limited range of motion noted of bilateral first metatarsophalangeal joint with left more painful than right.  Intra-articular pain noted bilaterally.  Pain with end range of motion.  Hallux limitus noted bilaterally.   Radiographs: 2 views of bilaterally skeletally mature adult foot: Severe arthritic changes noted to the right side.  Uneven joint space narrowing noted to the left side.  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  No other bony abnormalities identified mild arthritic changes noted to the dorsal aspect of the midfoot.  Mild elevatus present bilaterally. Assessment:   1. Arthritis of first metatarsophalangeal (MTP) joint of right foot   2. Arthritis of first metatarsophalangeal (MTP) joint of left foot    Plan:  Patient was evaluated and treated and all questions answered.  Bilateral first metatarsophalangeal joint arthritis with left more painful than right -Given clinically patient is improving and the pain is decreasing with steroid injection I believe he will benefit from another steroid injection to bilateral first metatarsophalangeal joint to help him get to closer to 95% or greater in terms of pain relief.  Patient states understanding would like to proceed with a third injection to bilateral first metatarsophalangeal joint.  However at this time I am worried that his pain is not getting resolved and he is  needing more injection.  I briefly discussed with him that he will need surgical intervention if this injection does not help.  Patient states understanding -A steroid injection was performed at bilateral first metatarsophalangeal joint using 1% plain Lidocaine and 10 mg of Kenalog. This was well tolerated. -I will also discussed with him orthotics management with Morton's extension take some of the stress off of the first metatarsophalangeal joint. -If no improvement in pain with steroid injection or, I will discuss surgical intervention during next office visit. No follow-ups on file.

## 2020-02-29 ENCOUNTER — Other Ambulatory Visit: Payer: Self-pay | Admitting: Internal Medicine

## 2020-02-29 NOTE — Telephone Encounter (Signed)
Done erx 

## 2020-03-04 ENCOUNTER — Other Ambulatory Visit: Payer: Self-pay | Admitting: Internal Medicine

## 2020-03-24 ENCOUNTER — Other Ambulatory Visit: Payer: Self-pay

## 2020-03-24 ENCOUNTER — Ambulatory Visit: Payer: Medicare Other | Admitting: Podiatry

## 2020-03-24 DIAGNOSIS — M19072 Primary osteoarthritis, left ankle and foot: Secondary | ICD-10-CM | POA: Diagnosis not present

## 2020-03-24 DIAGNOSIS — M79671 Pain in right foot: Secondary | ICD-10-CM

## 2020-03-24 DIAGNOSIS — M79672 Pain in left foot: Secondary | ICD-10-CM

## 2020-03-24 DIAGNOSIS — M19071 Primary osteoarthritis, right ankle and foot: Secondary | ICD-10-CM | POA: Diagnosis not present

## 2020-03-28 ENCOUNTER — Encounter: Payer: Self-pay | Admitting: Podiatry

## 2020-03-28 NOTE — Progress Notes (Signed)
Subjective:  Patient ID: Mark Dunlap, male    DOB: 05-01-1961,  MRN: [id number]  Chief Complaint  Patient presents with  . Wound Check    pt is here for bil foot pain f/u, of both feet, pt states that the foot pain is doing alot better, but states that the foot pain is still there.    59 y.o. male presents with the above complaint.  Patient is here for follow-up of bilateral first metatarsophalangeal joint pain with severe arthrosis.  Patient states that he cannot do surgery at this time as he has to take care of his mother.  However he will consider surgery in the near future when he has his home situation in order.  At this time patient states the injections have been helping.  He would like to continue doing injections for now.  I will continue to provide the services every 8 weeks.   Review of Systems: Negative except as noted in the HPI. Denies N/V/F/Ch.  Past Medical History:  Diagnosis Date  . Acute prostatitis 05/23/2009  . Adjustment disorder with depressed mood 09/2007  . Cataract    bilateral  . CERVICAL RADICULOPATHY, LEFT 08/30/2008  . CVA (cerebrovascular accident) ([place])   . DJD (degenerative joint disease) of hip   . DJD (degenerative joint disease) of knee    left knee  . GERD (gastroesophageal reflux disease)   . 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 H/O: substance abuse ([place])    hx of ETOH/Crack cocaine-none since 11/08 per pt/Does not Drive due to this  . HYPERTENSION 08/30/2008  . HYPERTHYROIDISM 02/13/2010   pt was told by  Dr Jenny Reichmann  that thyroid was now back to normal .. 2012 ...   . HYPOTENSION 10/31/2009  . 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 Other and unspecified hyperlipidemia 08/20/2013  . PONV (postoperative nausea and vomiting)   . Seasonal allergies   . SPINAL STENOSIS, CERVICAL 08/08/2009  . Stroke ([place])   . THYROID NODULE, RIGHT  03/02/2010    Current Outpatient Medications:  .  acetaminophen (TYLENOL) 500 MG tablet, Take 1,000 mg by mouth every 6 (six) hours as needed for moderate pain or headache., Disp: , Rfl:  .  adapalene (DIFFERIN) 0.1 % gel, APPLY 1 APPLICATION TOPICALLY AT BEDTIME., Disp: 45 g, Rfl: 1 .  AMBULATORY NON FORMULARY MEDICATION, Medication Name: GI Cocktail 90 ml-Viscous lidocaine 90 ml-Dicyclomine 10 mg/62ml 270 ml  Maalox Total ml's 450 ml Take 5-10 ml's and swallow as needed for breakthrough., Disp: 450 mL, Rfl: 2 .  atorvastatin (LIPITOR) 40 MG tablet, TAKE 1 TABLET BY MOUTH ONCE DAILY AT 6PM, Disp: 30 tablet, Rfl: 0 .  benzoyl peroxide (BENZOYL PEROXIDE) 5 % external liquid, Apply 1 application topically 2 (two) times daily., Disp: 226 g, Rfl: 0 .  bimatoprost (LUMIGAN) 0.01 % SOLN, Place 1 drop into both eyes at bedtime., Disp: , Rfl:  .  citalopram (CELEXA) 20 MG tablet, Take 1 tablet (20 mg total) by mouth daily., Disp: 90 tablet, Rfl: 3 .  clopidogrel (PLAVIX) 75 MG tablet, Take 1 tablet by mouth once daily, Disp: 90 tablet, Rfl: 0 .  diphenhydrAMINE (BENADRYL) 25 mg capsule, Take 50 mg by mouth every 6 (six) hours as needed for itching. , Disp: , Rfl:  .  famotidine (PEPCID) 40 MG tablet, Take 1 tablet (40  mg total) by mouth 2 (two) times daily. Take every morning and at bedtime, Disp: 60 tablet, Rfl: 3 .  fluticasone (FLONASE) 50 MCG/ACT nasal spray, USE 2 SPRAYS DAILY IN BOTH NOSTRILS, Disp: 32 mL, Rfl: 0 .  gabapentin (NEURONTIN) 100 MG capsule, Take 200 mg by mouth 2 (two) times daily., Disp: , Rfl: 2 .  hydrochlorothiazide (MICROZIDE) 12.5 MG capsule, TAKE 1 CAPSULE BY MOUTH EVERY DAY, Disp: 90 capsule, Rfl: 1 .  HYDROcodone-acetaminophen (NORCO) 10-325 MG per tablet, Take 1 tablet by mouth every 6 (six) hours as needed for moderate pain., Disp: 60 tablet, Rfl: 0 .  imiquimod (ALDARA) 5 % cream, Apply topically 3 (three) times a week., Disp: 24 each, Rfl: 2 .  Ketorolac Tromethamine (SPRIX)  15.75 MG/SPRAY SOLN, Place 1 spray into the nose every 6 (six) hours as needed (for pain)., Disp: , Rfl:  .  lidocaine (XYLOCAINE) 2 % solution, TAKE AS DIRECTED, Disp: 450 mL, Rfl: 0 .  nortriptyline (PAMELOR) 25 MG capsule, Take 25 mg by mouth at bedtime., Disp: , Rfl: 0 .  ondansetron (ZOFRAN ODT) 4 MG disintegrating tablet, Take 1 tablet (4 mg total) by mouth every 8 (eight) hours as needed for nausea or vomiting., Disp: 20 tablet, Rfl: 1 .  oxybutynin (DITROPAN) 5 MG tablet, Take 1 tablet (5 mg total) by mouth at bedtime., Disp: 90 tablet, Rfl: 3 .  pantoprazole (PROTONIX) 40 MG tablet, Take 1 tablet by mouth once daily, Disp: 90 tablet, Rfl: 2 .  predniSONE (STERAPRED UNI-PAK 21 TAB) 10 MG (21) TBPK tablet, , Disp: , Rfl:  .  sucralfate (CARAFATE) 1 g tablet, Take 1 tablet (1 g total) by mouth 3 (three) times daily. Take 20 minutes after meals and at bedtime, Disp: 120 tablet, Rfl: 1 .  tamsulosin (FLOMAX) 0.4 MG CAPS capsule, Take 1 capsule by mouth twice daily, Disp: 180 capsule, Rfl: 2 .  timolol (TIMOPTIC) 0.5 % ophthalmic solution, Place 1 drop into both eyes daily., Disp: , Rfl: 6 .  tiZANidine (ZANAFLEX) 4 MG tablet, TAKE 1 TABLET BY MOUTH EVERY 8 HOURS AS NEEDED(NOT TO EXCEED 3 DOSES IN 24 HOURS), Disp: , Rfl:  .  tretinoin (RETIN-A) 0.1 % cream, APPLY  CREAM TOPICALLY TO AFFECTED AREA OF FACE AT BEDTIME, Disp: 40 g, Rfl: 1 .  zolpidem (AMBIEN) 10 MG tablet, TAKE 1 TABLET BY MOUTH AT BEDTIME AS NEEDED FOR SLEEP, Disp: 90 tablet, Rfl: 1  Social History   Tobacco Use  Smoking Status Current Every Day Smoker  . Packs/day: 0.50  . Years: 40.00  . Pack years: 20.00  . Types: Cigarettes  Smokeless Tobacco Never Used    Allergies  Allergen Reactions  . Tramadol Itching  . Morphine And Related Itching  . Oxycodone Itching   Objective:   There were no vitals filed for this visit. There is no height or weight on file to calculate BMI. Constitutional Well developed. Well  nourished.  Vascular Dorsalis pedis pulses palpable bilaterally. Posterior tibial pulses palpable bilaterally. Capillary refill normal to all digits.  No cyanosis or clubbing noted. Pedal hair growth normal.  Neurologic Normal speech. Oriented to person, place, and time. Epicritic sensation to light touch grossly present bilaterally.  Dermatologic Nails well groomed and normal in appearance. No open wounds. No skin lesions.  Orthopedic:  Limited range of motion noted of bilateral first metatarsophalangeal joint with left more painful than right.  Intra-articular pain noted bilaterally.  Pain with end range of motion.  Hallux  limitus noted bilaterally.   Radiographs: 2 views of bilaterally skeletally mature adult foot: Severe arthritic changes noted to the right side.  Uneven joint space narrowing noted to the left side.  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  No other bony abnormalities identified mild arthritic changes noted to the dorsal aspect of the midfoot.  Mild elevatus present bilaterally. Assessment:   1. Arthritis of first metatarsophalangeal (MTP) joint of right foot   2. Arthritis of first metatarsophalangeal (MTP) joint of left foot   3. Bilateral foot pain    Plan:  Patient was evaluated and treated and all questions answered.  Bilateral first metatarsophalangeal joint arthritis with left more painful than right -Given clinically patient is improving and the pain is decreasing with steroid injection I believe he will benefit from another steroid injection to bilateral first metatarsophalangeal joint to help him get to closer to 95% or greater in terms of pain relief.  Patient states understanding would like to proceed with a injection to bilateral first metatarsophalangeal joint.  However at this time I am worried that his pain is not getting resolved and he is needing more injection.  I briefly  discussed with him that he will need surgical intervention if this injection does not help.  Patient states understanding -A steroid injection was performed at bilateral first metatarsophalangeal joint using 1% plain Lidocaine and 10 mg of Kenalog. This was well tolerated. -I will also discussed with him orthotics management with Morton's extension take some of the stress off of the first metatarsophalangeal joint. -I will continue doing bilateral steroid injection as long as it is controlling his pain.  I will continue to do steroid injection until patient is ready for surgery about every 8 weeks. No follow-ups on file.

## 2020-04-05 ENCOUNTER — Other Ambulatory Visit: Payer: Self-pay | Admitting: Internal Medicine

## 2020-04-05 NOTE — Telephone Encounter (Signed)
Please refill as per office routine med refill policy (all routine meds refilled for 3 mo or monthly per pt preference up to one year from last visit, then month to month grace period for 3 mo, then further med refills will have to be denied)  

## 2020-04-16 ENCOUNTER — Other Ambulatory Visit: Payer: Self-pay | Admitting: Internal Medicine

## 2020-05-02 ENCOUNTER — Encounter: Payer: Self-pay | Admitting: Internal Medicine

## 2020-05-02 ENCOUNTER — Ambulatory Visit: Payer: Medicare Other | Admitting: Internal Medicine

## 2020-05-02 ENCOUNTER — Other Ambulatory Visit: Payer: Self-pay

## 2020-05-02 VITALS — BP 140/90 | HR 72 | Temp 98.0°F | Ht 72.0 in | Wt 165.0 lb

## 2020-05-02 DIAGNOSIS — N289 Disorder of kidney and ureter, unspecified: Secondary | ICD-10-CM | POA: Diagnosis not present

## 2020-05-02 DIAGNOSIS — E785 Hyperlipidemia, unspecified: Secondary | ICD-10-CM

## 2020-05-02 DIAGNOSIS — I10 Essential (primary) hypertension: Secondary | ICD-10-CM | POA: Diagnosis not present

## 2020-05-02 DIAGNOSIS — R972 Elevated prostate specific antigen [PSA]: Secondary | ICD-10-CM

## 2020-05-02 DIAGNOSIS — M7989 Other specified soft tissue disorders: Secondary | ICD-10-CM

## 2020-05-02 DIAGNOSIS — F17200 Nicotine dependence, unspecified, uncomplicated: Secondary | ICD-10-CM | POA: Diagnosis not present

## 2020-05-02 DIAGNOSIS — Z86010 Personal history of colonic polyps: Secondary | ICD-10-CM

## 2020-05-02 MED ORDER — TAMSULOSIN HCL 0.4 MG PO CAPS: ORAL_CAPSULE | ORAL | 3 refills | Status: DC

## 2020-05-02 MED ORDER — PANTOPRAZOLE SODIUM 40 MG PO TBEC: 40.0000 mg | DELAYED_RELEASE_TABLET | Freq: Every day | ORAL | 3 refills | Status: DC

## 2020-05-02 MED ORDER — CITALOPRAM HYDROBROMIDE 20 MG PO TABS: 20.0000 mg | ORAL_TABLET | Freq: Every day | ORAL | 3 refills | Status: DC

## 2020-05-02 MED ORDER — CLOPIDOGREL BISULFATE 75 MG PO TABS: 75.0000 mg | ORAL_TABLET | Freq: Every day | ORAL | 3 refills | Status: DC

## 2020-05-02 MED ORDER — ATORVASTATIN CALCIUM 40 MG PO TABS: ORAL_TABLET | ORAL | 3 refills | Status: DC

## 2020-05-02 MED ORDER — HYDROCHLOROTHIAZIDE 12.5 MG PO CAPS: ORAL_CAPSULE | ORAL | 3 refills | Status: DC

## 2020-05-02 NOTE — Assessment & Plan Note (Addendum)
Etiology ucnleawr, for Left venous doppler - r/o dvt  I spent 31 minutes in preparing to see the patient by review of recent labs, imaging and procedures, obtaining and reviewing separately obtained history, communicating with the patient and family or caregiver, ordering medications, tests or procedures, and documenting clinical information in the EHR including the differential Dx, treatment, and any further evaluation and other management of left leg swelling, htn, hld, renal insufficiency, smoker, elev psa

## 2020-05-02 NOTE — Assessment & Plan Note (Signed)
stable overall by history and exam, recent data reviewed with pt, and pt to continue medical treatment as before,  to f/u any worsening symptoms or concerns  

## 2020-05-02 NOTE — Patient Instructions (Addendum)
Please continue all other medications as before, and refills have been done if requested.  Please have the pharmacy call with any other refills you may need.  Please continue your efforts at being more active, low cholesterol diet, and weight control.  Please keep your appointments with your specialists as you may have planned  You will be contacted regarding the referral for: colonoscopy, and venous doppler ultrasound - to see [facility] today  Please go to the LAB at the blood drawing area for the tests to be done  You will be contacted by phone if any changes need to be made immediately.  Otherwise, you will receive a letter about your results with an explanation, but please check with MyChart first.  Please remember to sign up for MyChart if you have not done so, as this will be important to you in the future with finding out test results, communicating by private email, and scheduling acute appointments online when needed.  Please make an Appointment to return in 6 months, or sooner if needed

## 2020-05-02 NOTE — Assessment & Plan Note (Signed)
Urged to quit 

## 2020-05-02 NOTE — Assessment & Plan Note (Signed)
For f/u lab 

## 2020-05-02 NOTE — Progress Notes (Signed)
Subjective:    Patient ID: Mark Dunlap, male    DOB: [birth year], 60 y.o.   MRN: [id number]  HPI  Here to f/u; overall doing ok,  Pt denies chest pain, increasing sob or doe, wheezing, orthopnea, PND, increased LE swelling, palpitations, dizziness or syncope.  Pt denies new neurological symptoms such as new headache, or facial or extremity weakness or numbness.  Pt denies polydipsia, polyuria, or low sugar episode.  Pt states overall good compliance with meds, except for newe onset 1 wk worsening LLE swelling unusual for him. Pt continues to have recurring LBP without change in severity, bowel or bladder change, fever, wt loss,  worsening LE pain/numbness/weakness, gait change or falls, seeing Dr Luan Pulling retiring soon, with ESI and improved pain.  Past Medical History:  Diagnosis Date  . Acute prostatitis 05/23/2009  . Adjustment disorder with depressed mood 09/2007  . Cataract    bilateral  . CERVICAL RADICULOPATHY, LEFT 08/30/2008  . CVA (cerebrovascular accident) ([place])   . DJD (degenerative joint disease) of hip   . DJD (degenerative joint disease) of knee    left knee  . GERD (gastroesophageal reflux disease)   . 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 H/O: substance abuse ([place])    hx of ETOH/Crack cocaine-none since 11/08 per pt/Does not Drive due to this  . HYPERTENSION 08/30/2008  . HYPERTHYROIDISM 02/13/2010   pt was told by  Dr Jenny Reichmann  that thyroid was now back to normal .. 2012 ...   . HYPOTENSION 10/31/2009  . 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 Other and unspecified hyperlipidemia 08/20/2013  . PONV (postoperative nausea and vomiting)   . Seasonal allergies   . SPINAL STENOSIS, CERVICAL 08/08/2009  . Stroke ([place])   . THYROID NODULE, RIGHT 03/02/2010   Past Surgical History:  Procedure Laterality Date  . ANTERIOR CERVICAL DECOMP/DISCECTOMY FUSION  10/11/2011    Procedure: ANTERIOR CERVICAL DECOMPRESSION/DISCECTOMY FUSION 3 LEVELS;  Surgeon: Eustace Moore;  Location: [facility];  Service: Neurosurgery;  Laterality: N/A;  Cervical three-four ,cervical four five cervical five six Anterior Cervical Decompression Fusion with peek + plate Nuvasive translational plate Orthofix peek (2 1/2 hours) Rm # [id number]  . ANTERIOR CERVICAL DECOMP/DISCECTOMY FUSION N/A 07/29/2014   Procedure: ANTERIOR CERVICAL DECOMPRESSION/DISCECTOMY FUSION 1 LEVEL/HARDWARE REMOVAL;  Surgeon: Eustace Moore, MD;  Location: [facility];  Service: Neurosurgery;  Laterality: N/A;  cervical six-seven  . POSTERIOR CERVICAL FUSION/FORAMINOTOMY N/A 09/23/2013   Procedure: CERVICALTWO TO CERVICAL SEVEN POSTERIOR CERVICAL FUSION/FORAMINOTOMY WITH LATERAL MASS FIXATION;  Surgeon: Eustace Moore, MD;  Location: [facility];  Service: Neurosurgery;  Laterality: N/A;  . Right Hand I&D  12/07   s/p rdue to abscess-Dr. Amedeo Plenty  . TOTAL HIP ARTHROPLASTY  12/17/2011   Procedure: TOTAL HIP ARTHROPLASTY;  Surgeon: Johnny Bridge, MD;  Location: [facility];  Service: Orthopedics;  Laterality: Left;    reports that he has been smoking cigarettes. He has a 20.00 pack-year smoking history. He has never used smokeless tobacco. He reports previous drug use. Drug: "Crack" cocaine. He reports that he does not drink alcohol. family history includes Alcohol abuse in his brother, cousin, and father; Arthritis in his mother; Diabetes in his brother and sister; Goiter in his mother and sister; Heart disease in his father and another family member; Hyperlipidemia in his mother; Hypertension  in his sister; Stroke in his father. Allergies  Allergen Reactions  . Tramadol Itching  . Morphine And Related Itching  . Oxycodone Itching   Current Outpatient Medications on File Prior to Visit  Medication Sig Dispense Refill  . acetaminophen (TYLENOL) 500 MG tablet Take 1,000 mg by mouth every 6 (six) hours as needed for moderate pain  or headache.    [name] adapalene (DIFFERIN) 0.1 % gel APPLY 1 APPLICATION TOPICALLY AT BEDTIME. 45 g 1  . AMBULATORY NON FORMULARY MEDICATION Medication Name: GI Cocktail 90 ml-Viscous lidocaine 90 ml-Dicyclomine 10 mg/33ml 270 ml  Maalox Total ml's 450 ml Take 5-10 ml's and swallow as needed for breakthrough. 450 mL 2  . benzoyl peroxide (BENZOYL PEROXIDE) 5 % external liquid Apply 1 application topically 2 (two) times daily. 226 g 0  . bimatoprost (LUMIGAN) 0.01 % SOLN Place 1 drop into both eyes at bedtime.    . diphenhydrAMINE (BENADRYL) 25 mg capsule Take 50 mg by mouth every 6 (six) hours as needed for itching.     . famotidine (PEPCID) 40 MG tablet Take 1 tablet (40 mg total) by mouth 2 (two) times daily. Take every morning and at bedtime 60 tablet 3  . fluticasone (FLONASE) 50 MCG/ACT nasal spray USE 2 SPRAYS DAILY IN BOTH NOSTRILS 32 mL 0  . gabapentin (NEURONTIN) 100 MG capsule Take 200 mg by mouth 2 (two) times daily.  2  . HYDROcodone-acetaminophen (NORCO) 10-325 MG per tablet Take 1 tablet by mouth every 6 (six) hours as needed for moderate pain. 60 tablet 0  . imiquimod (ALDARA) 5 % cream Apply topically 3 (three) times a week. 24 each 2  . Ketorolac Tromethamine (SPRIX) 15.75 MG/SPRAY SOLN Place 1 spray into the nose every 6 (six) hours as needed (for pain).    [name] lidocaine (XYLOCAINE) 2 % solution TAKE AS DIRECTED 450 mL 0  . nortriptyline (PAMELOR) 25 MG capsule Take 25 mg by mouth at bedtime.  0  . ondansetron (ZOFRAN ODT) 4 MG disintegrating tablet Take 1 tablet (4 mg total) by mouth every 8 (eight) hours as needed for nausea or vomiting. 20 tablet 1  . oxybutynin (DITROPAN) 5 MG tablet Take 1 tablet (5 mg total) by mouth at bedtime. 90 tablet 3  . sucralfate (CARAFATE) 1 g tablet Take 1 tablet (1 g total) by mouth 3 (three) times daily. Take 20 minutes after meals and at bedtime 120 tablet 1  . timolol (TIMOPTIC) 0.5 % ophthalmic solution Place 1 drop into both eyes daily.  6  .  tiZANidine (ZANAFLEX) 4 MG tablet TAKE 1 TABLET BY MOUTH EVERY 8 HOURS AS NEEDED(NOT TO EXCEED 3 DOSES IN 24 HOURS)    . tretinoin (RETIN-A) 0.1 % cream APPLY  CREAM TOPICALLY TO AFFECTED AREA OF FACE AT BEDTIME 40 g 1  . zolpidem (AMBIEN) 10 MG tablet TAKE 1 TABLET BY MOUTH AT BEDTIME AS NEEDED FOR SLEEP 90 tablet 1   No current facility-administered medications on file prior to visit.   Review of Systems All otherwise neg per pt     Objective:   Physical Exam BP 140/90 (BP Location: Left Arm, Patient Position: Sitting, Cuff Size: Large)   Pulse 72   Temp 98 F (36.7 C) (Oral)   Ht 6' (1.829 m)   Wt 165 lb (74.8 kg)   SpO2 98%   BMI 22.38 kg/m  VS noted,  Constitutional: Pt appears in NAD HENT: Head: NCAT.  Right Ear: External ear normal.  Left Ear: External ear normal.  Eyes: . Pupils are equal, round, and reactive to light. Conjunctivae and EOM are normal Nose: without d/c or deformity Neck: Neck supple. Gross normal ROM Cardiovascular: Normal rate and regular rhythm.   Pulmonary/Chest: Effort normal and breath sounds without rales or wheezing.  Abd:  Soft, NT, ND, + BS, no organomegaly Neurological: Pt is alert. At baseline orientation, motor grossly intact Skin: Skin is warm. No rashes, other new lesions, diiffuse 1-2+ LLE edema Psychiatric: Pt behavior is normal without agitation  Lab Results  Component Value Date   WBC 8.4 11/05/2019   HGB 13.5 11/05/2019   HCT 41.3 11/05/2019   PLT 352.0 11/05/2019   GLUCOSE 96 11/05/2019   CHOL 119 11/05/2019   TRIG 105.0 11/05/2019   HDL 51.90 11/05/2019   LDLDIRECT 100.0 09/02/2018   LDLCALC 46 11/05/2019   ALT 17 11/05/2019   AST 14 11/05/2019   NA 140 11/05/2019   K 4.2 11/05/2019   CL 101 11/05/2019   CREATININE 1.01 11/05/2019   BUN 7 11/05/2019   CO2 31 11/05/2019   TSH 1.33 11/05/2019   PSA 5.54 (H) 11/05/2019   INR 0.94 10/26/2018   HGBA1C 5.5 05/03/2019      Assessment & Plan:

## 2020-05-03 ENCOUNTER — Encounter: Payer: Self-pay | Admitting: Internal Medicine

## 2020-05-03 ENCOUNTER — Ambulatory Visit (HOSPITAL_COMMUNITY)
Admission: RE | Admit: 2020-05-03 | Discharge: 2020-05-03 | Disposition: A | Discharge status: Home / Self Care | Payer: Medicare Other | Source: Ambulatory Visit | Attending: Internal Medicine | Admitting: Internal Medicine

## 2020-05-03 DIAGNOSIS — M7989 Other specified soft tissue disorders: Secondary | ICD-10-CM | POA: Insufficient documentation

## 2020-05-03 LAB — COMPLETE METABOLIC PANEL WITH GFR
AG Ratio: 1.4 (calc) (ref 1.0–2.5)
ALT: 21 U/L (ref 9–46)
AST: 16 U/L (ref 10–35)
Albumin: 3.9 g/dL (ref 3.6–5.1)
Alkaline phosphatase (APISO): 113 U/L (ref 35–144)
BUN/Creatinine Ratio: 5 (calc) — ABNORMAL LOW (ref 6–22)
BUN: 5 mg/dL — ABNORMAL LOW (ref 7–25)
CO2: 29 mmol/L (ref 20–32)
Calcium: 10.3 mg/dL (ref 8.6–10.3)
Chloride: 101 mmol/L (ref 98–110)
Creat: 1.05 mg/dL (ref 0.70–1.25)
GFR, Est African American: 89 mL/min/{1.73_m2} (ref >=60)
GFR, Est Non African American: 77 mL/min/{1.73_m2} (ref >=60)
Globulin: 2.8 g/dL (calc) (ref 1.9–3.7)
Glucose, Bld: 85 mg/dL (ref 65–99)
Potassium: 4.3 mmol/L (ref 3.5–5.3)
Sodium: 141 mmol/L (ref 135–146)
Total Bilirubin: 0.5 mg/dL (ref 0.2–1.2)
Total Protein: 6.7 g/dL (ref 6.1–8.1)

## 2020-05-03 LAB — CBC WITH DIFFERENTIAL/PLATELET
Absolute Monocytes: 878 cells/uL (ref 200–950)
Basophils Absolute: 39 cells/uL (ref 0–200)
Basophils Relative: 0.5 %
Eosinophils Absolute: 393 cells/uL (ref 15–500)
Eosinophils Relative: 5.1 %
HCT: 41.2 % (ref 38.5–50.0)
Hemoglobin: 13.4 g/dL (ref 13.2–17.1)
Lymphs Abs: 2179 cells/uL (ref 850–3900)
MCH: 30.5 pg (ref 27.0–33.0)
MCHC: 32.5 g/dL (ref 32.0–36.0)
MCV: 93.6 fL (ref 80.0–100.0)
MPV: 9.5 fL (ref 7.5–12.5)
Monocytes Relative: 11.4 %
Neutro Abs: 4212 cells/uL (ref 1500–7800)
Neutrophils Relative %: 54.7 %
Platelets: 343 10*3/uL (ref 140–400)
RBC: 4.4 10*6/uL (ref 4.20–5.80)
RDW: 13 % (ref 11.0–15.0)
Total Lymphocyte: 28.3 %
WBC: 7.7 10*3/uL (ref 3.8–10.8)

## 2020-05-03 LAB — LIPID PANEL
Cholesterol: 152 mg/dL (ref <200)
HDL: 66 mg/dL (ref >=40)
LDL Cholesterol (Calc): 64 mg/dL (calc)
Non-HDL Cholesterol (Calc): 86 mg/dL (calc) (ref <130)
Total CHOL/HDL Ratio: 2.3 (calc) (ref <5.0)
Triglycerides: 140 mg/dL (ref <150)

## 2020-05-03 LAB — PSA: PSA: 4.2 ng/mL — ABNORMAL HIGH (ref <=4.0)

## 2020-05-03 NOTE — Progress Notes (Signed)
Left lower extremity venous duplex has been completed. Preliminary results can be found in CV Proc through chart review.  Results were given to [facility] at Dr. Gwynn Burly office.  05/03/20 11:16 AM Mark Dunlap RVT

## 2020-05-05 ENCOUNTER — Other Ambulatory Visit: Payer: Self-pay

## 2020-05-05 ENCOUNTER — Ambulatory Visit: Payer: Medicare Other | Admitting: Podiatry

## 2020-05-05 DIAGNOSIS — M19072 Primary osteoarthritis, left ankle and foot: Secondary | ICD-10-CM | POA: Diagnosis not present

## 2020-05-05 DIAGNOSIS — M19071 Primary osteoarthritis, right ankle and foot: Secondary | ICD-10-CM | POA: Diagnosis not present

## 2020-05-09 ENCOUNTER — Ambulatory Visit: Payer: Self-pay

## 2020-05-09 ENCOUNTER — Encounter: Payer: Self-pay | Admitting: Podiatry

## 2020-05-09 NOTE — Progress Notes (Signed)
Subjective:  Patient ID: Mark Dunlap, male    DOB: 09-11-1961,  MRN: [id number]  Chief Complaint  Patient presents with  . Foot Pain    pt is here for right foot     59 y.o. male presents with the above complaint.  Patient is here for follow-up of bilateral first metatarsophalangeal joint pain with severe arthrosis.  Patient states that he cannot do surgery at this time as he has to take care of his mother.  However he will consider surgery in the near future when he has his home situation in order.  At this time patient states the injections have been helping.  He would like to continue doing injections for now.  I will continue to provide the services every 8 weeks.   Review of Systems: Negative except as noted in the HPI. Denies N/V/F/Ch.  Past Medical History:  Diagnosis Date  . Acute prostatitis 05/23/2009  . Adjustment disorder with depressed mood 09/2007  . Cataract    bilateral  . CERVICAL RADICULOPATHY, LEFT 08/30/2008  . CVA (cerebrovascular accident) ([place])   . DJD (degenerative joint disease) of hip   . DJD (degenerative joint disease) of knee    left knee  . GERD (gastroesophageal reflux disease)   . 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 H/O: substance abuse ([place])    hx of ETOH/Crack cocaine-none since 11/08 per pt/Does not Drive due to this  . HYPERTENSION 08/30/2008  . HYPERTHYROIDISM 02/13/2010   pt was told by  Dr Jenny Reichmann  that thyroid was now back to normal .. 2012 ...   . HYPOTENSION 10/31/2009  . 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 Other and unspecified hyperlipidemia 08/20/2013  . PONV (postoperative nausea and vomiting)   . Seasonal allergies   . SPINAL STENOSIS, CERVICAL 08/08/2009  . Stroke ([place])   . THYROID NODULE, RIGHT 03/02/2010    Current Outpatient Medications:  .  acetaminophen (TYLENOL) 500 MG tablet, Take 1,000 mg by mouth every 6 (six)  hours as needed for moderate pain or headache., Disp: , Rfl:  .  adapalene (DIFFERIN) 0.1 % gel, APPLY 1 APPLICATION TOPICALLY AT BEDTIME., Disp: 45 g, Rfl: 1 .  AMBULATORY NON FORMULARY MEDICATION, Medication Name: GI Cocktail 90 ml-Viscous lidocaine 90 ml-Dicyclomine 10 mg/32ml 270 ml  Maalox Total ml's 450 ml Take 5-10 ml's and swallow as needed for breakthrough., Disp: 450 mL, Rfl: 2 .  atorvastatin (LIPITOR) 40 MG tablet, TAKE 1 TABLET BY MOUTH ONCE DAILY AT  6  PM, Disp: 90 tablet, Rfl: 3 .  benzoyl peroxide (BENZOYL PEROXIDE) 5 % external liquid, Apply 1 application topically 2 (two) times daily., Disp: 226 g, Rfl: 0 .  bimatoprost (LUMIGAN) 0.01 % SOLN, Place 1 drop into both eyes at bedtime., Disp: , Rfl:  .  citalopram (CELEXA) 20 MG tablet, Take 1 tablet (20 mg total) by mouth daily., Disp: 90 tablet, Rfl: 3 .  clopidogrel (PLAVIX) 75 MG tablet, Take 1 tablet (75 mg total) by mouth daily., Disp: 90 tablet, Rfl: 3 .  diphenhydrAMINE (BENADRYL) 25 mg capsule, Take 50 mg by mouth every 6 (six) hours as needed for itching. , Disp: , Rfl:  .  famotidine (PEPCID) 40 MG tablet, Take 1 tablet (40 mg total) by mouth 2 (two) times daily. Take every morning and at bedtime, Disp: 60 tablet, Rfl:  3 .  fluticasone (FLONASE) 50 MCG/ACT nasal spray, USE 2 SPRAYS DAILY IN BOTH NOSTRILS, Disp: 32 mL, Rfl: 0 .  gabapentin (NEURONTIN) 100 MG capsule, Take 200 mg by mouth 2 (two) times daily., Disp: , Rfl: 2 .  hydrochlorothiazide (MICROZIDE) 12.5 MG capsule, TAKE 1 CAPSULE BY MOUTH EVERY DAY, Disp: 90 capsule, Rfl: 3 .  HYDROcodone-acetaminophen (NORCO) 10-325 MG per tablet, Take 1 tablet by mouth every 6 (six) hours as needed for moderate pain., Disp: 60 tablet, Rfl: 0 .  imiquimod (ALDARA) 5 % cream, Apply topically 3 (three) times a week., Disp: 24 each, Rfl: 2 .  Ketorolac Tromethamine (SPRIX) 15.75 MG/SPRAY SOLN, Place 1 spray into the nose every 6 (six) hours as needed (for pain)., Disp: , Rfl:  .   lidocaine (XYLOCAINE) 2 % solution, TAKE AS DIRECTED, Disp: 450 mL, Rfl: 0 .  nortriptyline (PAMELOR) 25 MG capsule, Take 25 mg by mouth at bedtime., Disp: , Rfl: 0 .  ondansetron (ZOFRAN ODT) 4 MG disintegrating tablet, Take 1 tablet (4 mg total) by mouth every 8 (eight) hours as needed for nausea or vomiting., Disp: 20 tablet, Rfl: 1 .  oxybutynin (DITROPAN) 5 MG tablet, Take 1 tablet (5 mg total) by mouth at bedtime., Disp: 90 tablet, Rfl: 3 .  pantoprazole (PROTONIX) 40 MG tablet, Take 1 tablet (40 mg total) by mouth daily., Disp: 90 tablet, Rfl: 3 .  sucralfate (CARAFATE) 1 g tablet, Take 1 tablet (1 g total) by mouth 3 (three) times daily. Take 20 minutes after meals and at bedtime, Disp: 120 tablet, Rfl: 1 .  tamsulosin (FLOMAX) 0.4 MG CAPS capsule, Take 1 capsule by mouth twice daily, Disp: 180 capsule, Rfl: 3 .  timolol (TIMOPTIC) 0.5 % ophthalmic solution, Place 1 drop into both eyes daily., Disp: , Rfl: 6 .  tiZANidine (ZANAFLEX) 4 MG tablet, TAKE 1 TABLET BY MOUTH EVERY 8 HOURS AS NEEDED(NOT TO EXCEED 3 DOSES IN 24 HOURS), Disp: , Rfl:  .  tretinoin (RETIN-A) 0.1 % cream, APPLY  CREAM TOPICALLY TO AFFECTED AREA OF FACE AT BEDTIME, Disp: 40 g, Rfl: 1 .  zolpidem (AMBIEN) 10 MG tablet, TAKE 1 TABLET BY MOUTH AT BEDTIME AS NEEDED FOR SLEEP, Disp: 90 tablet, Rfl: 1  Social History   Tobacco Use  Smoking Status Current Every Day Smoker  . Packs/day: 0.50  . Years: 40.00  . Pack years: 20.00  . Types: Cigarettes  Smokeless Tobacco Never Used    Allergies  Allergen Reactions  . Tramadol Itching  . Morphine And Related Itching  . Oxycodone Itching   Objective:   There were no vitals filed for this visit. There is no height or weight on file to calculate BMI. Constitutional Well developed. Well nourished.  Vascular Dorsalis pedis pulses palpable bilaterally. Posterior tibial pulses palpable bilaterally. Capillary refill normal to all digits.  No cyanosis or clubbing  noted. Pedal hair growth normal.  Neurologic Normal speech. Oriented to person, place, and time. Epicritic sensation to light touch grossly present bilaterally.  Dermatologic Nails well groomed and normal in appearance. No open wounds. No skin lesions.  Orthopedic:  Limited range of motion noted of bilateral first metatarsophalangeal joint with left more painful than right.  Intra-articular pain noted bilaterally.  Pain with end range of motion.  Hallux limitus noted bilaterally.   Radiographs: 2 views of bilaterally skeletally mature adult foot: Severe arthritic changes noted to the right side.  Uneven joint space narrowing noted to the left side.  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  No other bony abnormalities identified mild arthritic changes noted to the dorsal aspect of the midfoot.  Mild elevatus present bilaterally. Assessment:   1. Arthritis of first metatarsophalangeal (MTP) joint of left foot   2. Arthritis of first metatarsophalangeal (MTP) joint of right foot    Plan:  Patient was evaluated and treated and all questions answered.  Bilateral first metatarsophalangeal joint arthritis with left more painful than right -Given clinically patient is improving and the pain is decreasing with steroid injection I believe he will benefit from another steroid injection to bilateral first metatarsophalangeal joint to help him get to closer to 95% or greater in terms of pain relief.  Patient states understanding would like to proceed with a injection to bilateral first metatarsophalangeal joint.  However at this time I am worried that his pain is not getting resolved and he is needing more injection.  I briefly discussed with him that he will need surgical intervention if this injection does not help.  Patient states understanding -A steroid injection was performed at bilateral first metatarsophalangeal joint  using 1% plain Lidocaine and 10 mg of Kenalog. This was well tolerated. -I will also discussed with him orthotics management with Morton's extension take some of the stress off of the first metatarsophalangeal joint. -I will continue doing bilateral steroid injection as long as it is controlling his pain.  I will continue to do steroid injection until patient is ready for surgery about every 10 weeks. No follow-ups on file.

## 2020-05-19 ENCOUNTER — Other Ambulatory Visit: Payer: Self-pay | Admitting: Internal Medicine

## 2020-06-01 ENCOUNTER — Other Ambulatory Visit: Payer: Self-pay | Admitting: Internal Medicine

## 2020-06-09 ENCOUNTER — Ambulatory Visit: Payer: Medicare Other | Admitting: Gastroenterology

## 2020-06-13 ENCOUNTER — Telehealth: Payer: Self-pay

## 2020-06-13 ENCOUNTER — Encounter: Payer: Self-pay | Admitting: Gastroenterology

## 2020-06-13 ENCOUNTER — Other Ambulatory Visit: Payer: Self-pay | Admitting: Internal Medicine

## 2020-06-13 ENCOUNTER — Ambulatory Visit: Payer: Medicare Other | Admitting: Gastroenterology

## 2020-06-13 VITALS — BP 136/72 | HR 84 | Ht 71.0 in | Wt 168.0 lb

## 2020-06-13 DIAGNOSIS — Z7901 Long term (current) use of anticoagulants: Secondary | ICD-10-CM | POA: Diagnosis not present

## 2020-06-13 DIAGNOSIS — Z86010 Personal history of colonic polyps: Secondary | ICD-10-CM

## 2020-06-13 MED ORDER — NA SULFATE-K SULFATE-MG SULF 17.5-3.13-1.6 GM/177ML PO SOLN
1.0000 | Freq: Once | ORAL | 0 refills | Status: AC
Start: 2020-06-13 — End: 2020-06-13

## 2020-06-13 NOTE — Telephone Encounter (Signed)
[facility] Pre-operative Risk Assessment     Request for surgical clearance:     Endoscopy Procedure  What type of surgery is being performed?     Colonoscopy  When is this surgery scheduled?     07/12/20  What type of clearance is required ?   Pharmacy  Are there any medications that need to be held prior to surgery and how long? Plavix starting 5 days prior  Practice name and name of physician performing surgery?      [facility]  What is your office phone and fax number?      Phone- [phone]  [phone]  Anesthesia type (None, local, MAC, general) ?       MAC

## 2020-06-13 NOTE — Progress Notes (Signed)
Reviewed and agree with management plan.  Candi Profit T. Jamahl Lemmons, MD FACG  Gastroenterology  

## 2020-06-13 NOTE — Progress Notes (Signed)
06/13/2020 ALVAH LAGROW [id number] May 01, 1961   HISTORY OF PRESENT ILLNESS: This is a 59 year old male who is a patient of Dr. Lynne Leader.  He is here today to discuss surveillance colonoscopy.  His last colonoscopy was in July 2021 at which time the study was normal, but he has a history of adenomatous colon polyps, therefore, it was recommended he have a repeat in 5 years.  He does not have any complaints at this time.  Moves his bowels well without issues.  He is on Plavix for history of stroke in January 2020.  This is being prescribed by his PCP, Dr. Jenny Reichmann.  He did hold his Plavix in September 2024 endoscopy and everything went well at that time.    Past Medical History:  Diagnosis Date  . Acute prostatitis 05/23/2009  . Adjustment disorder with depressed mood 09/2007  . Cataract    bilateral  . CERVICAL RADICULOPATHY, LEFT 08/30/2008  . CVA (cerebrovascular accident) ([place])   . DJD (degenerative joint disease) of hip   . DJD (degenerative joint disease) of knee    left knee  . GERD (gastroesophageal reflux disease)   . 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 H/O: substance abuse ([place])    hx of ETOH/Crack cocaine-none since 11/08 per pt/Does not Drive due to this  . HYPERTENSION 08/30/2008  . HYPERTHYROIDISM 02/13/2010   pt was told by  Dr Jenny Reichmann  that thyroid was now back to normal .. 2012 ...   . HYPOTENSION 10/31/2009  . 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 Other and unspecified hyperlipidemia 08/20/2013  . PONV (postoperative nausea and vomiting)   . Seasonal allergies   . SPINAL STENOSIS, CERVICAL 08/08/2009  . Stroke ([place])   . THYROID NODULE, RIGHT 03/02/2010   Past Surgical History:  Procedure Laterality Date  . ANTERIOR CERVICAL DECOMP/DISCECTOMY FUSION  10/11/2011   Procedure: ANTERIOR CERVICAL DECOMPRESSION/DISCECTOMY FUSION 3 LEVELS;  Surgeon: Eustace Moore;   Location: [facility];  Service: Neurosurgery;  Laterality: N/A;  Cervical three-four ,cervical four five cervical five six Anterior Cervical Decompression Fusion with peek + plate Nuvasive translational plate Orthofix peek (2 1/2 hours) Rm # [id number]  . ANTERIOR CERVICAL DECOMP/DISCECTOMY FUSION N/A 07/29/2014   Procedure: ANTERIOR CERVICAL DECOMPRESSION/DISCECTOMY FUSION 1 LEVEL/HARDWARE REMOVAL;  Surgeon: Eustace Moore, MD;  Location: [facility];  Service: Neurosurgery;  Laterality: N/A;  cervical six-seven  . POSTERIOR CERVICAL FUSION/FORAMINOTOMY N/A 09/23/2013   Procedure: CERVICALTWO TO CERVICAL SEVEN POSTERIOR CERVICAL FUSION/FORAMINOTOMY WITH LATERAL MASS FIXATION;  Surgeon: Eustace Moore, MD;  Location: [facility];  Service: Neurosurgery;  Laterality: N/A;  . Right Hand I&D  12/07   s/p rdue to abscess-Dr. Amedeo Plenty  . TOTAL HIP ARTHROPLASTY  12/17/2011   Procedure: TOTAL HIP ARTHROPLASTY;  Surgeon: Johnny Bridge, MD;  Location: [facility];  Service: Orthopedics;  Laterality: Left;    reports that he has been smoking cigarettes. He has a 20.00 pack-year smoking history. He has never used smokeless tobacco. He reports previous drug use. Drug: "Crack" cocaine. He reports that he does not drink alcohol. family history includes Alcohol abuse in his brother, cousin, and father; Arthritis in his mother; Diabetes in his brother and sister; Goiter in his mother and sister; Heart disease in his father and another family member; Hyperlipidemia in his mother; Hypertension in his  sister; Stroke in his father. Allergies  Allergen Reactions  . Tramadol Itching  . Morphine And Related Itching  . Oxycodone Itching      Outpatient Encounter Medications as of 06/13/2020  Medication Sig  . acetaminophen (TYLENOL) 500 MG tablet Take 1,000 mg by mouth every 6 (six) hours as needed for moderate pain or headache.  [name] adapalene (DIFFERIN) 0.1 % gel APPLY 1 APPLICATION TOPICALLY AT BEDTIME.  [name] atorvastatin  (LIPITOR) 40 MG tablet TAKE 1 TABLET BY MOUTH ONCE DAILY AT  6  PM  . benzoyl peroxide (BENZOYL PEROXIDE) 5 % external liquid Apply 1 application topically 2 (two) times daily.  . bimatoprost (LUMIGAN) 0.01 % SOLN Place 1 drop into both eyes at bedtime.  . citalopram (CELEXA) 20 MG tablet Take 1 tablet (20 mg total) by mouth daily.  . clopidogrel (PLAVIX) 75 MG tablet Take 1 tablet (75 mg total) by mouth daily.  . diphenhydrAMINE (BENADRYL) 25 mg capsule Take 50 mg by mouth every 6 (six) hours as needed for itching.   . famotidine (PEPCID) 40 MG tablet Take 1 tablet (40 mg total) by mouth 2 (two) times daily. Take every morning and at bedtime  . fluticasone (FLONASE) 50 MCG/ACT nasal spray USE 2 SPRAYS DAILY IN BOTH NOSTRILS  . gabapentin (NEURONTIN) 100 MG capsule Take 200 mg by mouth 2 (two) times daily.  . hydrochlorothiazide (MICROZIDE) 12.5 MG capsule TAKE 1 CAPSULE BY MOUTH EVERY DAY  . HYDROcodone-acetaminophen (NORCO) 10-325 MG per tablet Take 1 tablet by mouth every 6 (six) hours as needed for moderate pain.  [name] imiquimod (ALDARA) 5 % cream Apply topically 3 (three) times a week.  . lidocaine (XYLOCAINE) 2 % solution TAKE AS DIRECTED  . nortriptyline (PAMELOR) 25 MG capsule Take 25 mg by mouth at bedtime.  . ondansetron (ZOFRAN ODT) 4 MG disintegrating tablet Take 1 tablet (4 mg total) by mouth every 8 (eight) hours as needed for nausea or vomiting.  [name] oxybutynin (DITROPAN) 5 MG tablet Take 1 tablet (5 mg total) by mouth at bedtime.  . pantoprazole (PROTONIX) 40 MG tablet Take 1 tablet (40 mg total) by mouth daily.  . sucralfate (CARAFATE) 1 g tablet Take 1 tablet (1 g total) by mouth 3 (three) times daily. Take 20 minutes after meals and at bedtime  . tamsulosin (FLOMAX) 0.4 MG CAPS capsule Take 1 capsule by mouth twice daily  . timolol (TIMOPTIC) 0.5 % ophthalmic solution Place 1 drop into both eyes daily.  [name] tiZANidine (ZANAFLEX) 4 MG tablet TAKE 1 TABLET BY MOUTH EVERY 8 HOURS AS  NEEDED(NOT TO EXCEED 3 DOSES IN 24 HOURS)  . tretinoin (RETIN-A) 0.1 % cream APPLY  CREAM TOPICALLY TO AFFECTED AREA OF FACE AT BEDTIME  . zolpidem (AMBIEN) 10 MG tablet TAKE 1 TABLET BY MOUTH AT BEDTIME AS NEEDED FOR SLEEP  . [DISCONTINUED] AMBULATORY NON FORMULARY MEDICATION Medication Name: GI Cocktail 90 ml-Viscous lidocaine 90 ml-Dicyclomine 10 mg/23ml 270 ml  Maalox Total ml's 450 ml Take 5-10 ml's and swallow as needed for breakthrough.  . [DISCONTINUED] Ketorolac Tromethamine (SPRIX) 15.75 MG/SPRAY SOLN Place 1 spray into the nose every 6 (six) hours as needed (for pain).   No facility-administered encounter medications on file as of 06/13/2020.    REVIEW OF SYSTEMS  : All other systems reviewed and negative except where noted in the History of Present Illness.  PHYSICAL EXAM: BP 136/72   Pulse 84   Ht 5\' 11"  (1.803 m)   Wt 168 lb (  76.2 kg)   BMI 23.43 kg/m  General: Well developed black male in no acute distress Head: Normocephalic and atraumatic Eyes:  Sclerae anicteric, conjunctiva pink. Ears: Normal auditory acuity Lungs: Clear throughout to auscultation; no W/R/R. Heart: Regular rate and rhythm; no M/R/G. Abdomen: Soft, non-distended.  BS present.  Non-tender. Rectal:  Will be done at th time of colonoscopy. Musculoskeletal: Symmetrical with no gross deformities  Skin: No lesions on visible extremities Extremities: No edema  Neurological: Alert oriented x 4, grossly non-focal Psychological:  Alert and cooperative. Normal mood and affect  ASSESSMENT AND PLAN: *Personal history of colon polyps with last colonoscopy in July 2016: Repeat recommended a 5-year interval.  We will plan for colonoscopy with Dr. Fuller Plan. *Chronic antiplatelet use with Plavix due to stroke in January 2020:  Hold Plavix for 5 days before procedure - will instruct when and how to resume after procedure. Risks and benefits of procedure including bleeding, perforation, infection, missed lesions,  medication reactions and possible hospitalization or surgery if complications occur explained. Additional rare but real risk of cardiovascular event such as heart attack or ischemia/infarct of other organs off Plavix explained and need to seek urgent help if this occurs. Will communicate by phone or EMR with patient's prescribing provider, Dr. Jenny Reichmann, to confirm that holding Plavix is reasonable in this case.    CC:  Biagio Borg, MD

## 2020-06-13 NOTE — Patient Instructions (Signed)
If you are age 60 or older, your body mass index should be between 23-30. Your Body mass index is 23.43 kg/m. If this is out of the aforementioned range listed, please consider follow up with your Primary Care Provider.  If you are age 92 or younger, your body mass index should be between 19-25. Your Body mass index is 23.43 kg/m. If this is out of the aformentioned range listed, please consider follow up with your Primary Care Provider.   You have been scheduled for a colonoscopy. Please follow written instructions given to you at your visit today.  Please pick up your prep supplies at the pharmacy within the next 1-3 days. If you use inhalers (even only as needed), please bring them with you on the day of your procedure.  You will be contacted by our office prior to your procedure for directions on holding your Plavix.  If you do not hear from our office 1 week prior to your scheduled procedure, please call [phone] to discuss.   Follow up pending the results of your Colonoscopy.

## 2020-06-14 NOTE — Telephone Encounter (Signed)
Agree with hold plavix for 5 days prior

## 2020-06-15 NOTE — Telephone Encounter (Signed)
Patient has been contacted and advised that he needs to stop his Plavix on 07/07/20. He has wrote this down and read this back to me as well as letting me know how long he will not take it. He also advised he will be getting an injection in his back on 07/10/20 that he will also be having his Plavix stopped for.

## 2020-06-24 ENCOUNTER — Other Ambulatory Visit: Payer: Self-pay | Admitting: Internal Medicine

## 2020-07-11 ENCOUNTER — Encounter: Payer: Self-pay | Admitting: Certified Registered Nurse Anesthetist

## 2020-07-12 ENCOUNTER — Encounter: Payer: Self-pay | Admitting: Gastroenterology

## 2020-07-12 ENCOUNTER — Ambulatory Visit (AMBULATORY_SURGERY_CENTER): Payer: Medicare Other | Admitting: Gastroenterology

## 2020-07-12 ENCOUNTER — Other Ambulatory Visit: Payer: Self-pay

## 2020-07-12 VITALS — BP 162/81 | HR 64 | Temp 97.8°F | Resp 16 | Ht 71.0 in | Wt 168.0 lb

## 2020-07-12 DIAGNOSIS — D125 Benign neoplasm of sigmoid colon: Secondary | ICD-10-CM

## 2020-07-12 DIAGNOSIS — Z86010 Personal history of colonic polyps: Secondary | ICD-10-CM

## 2020-07-12 DIAGNOSIS — K635 Polyp of colon: Secondary | ICD-10-CM

## 2020-07-12 MED ORDER — SODIUM CHLORIDE 0.9 % IV SOLN: 500.0000 mL | Freq: Once | INTRAVENOUS | Status: DC

## 2020-07-12 NOTE — Progress Notes (Signed)
Report given to PACU, vss 

## 2020-07-12 NOTE — Progress Notes (Signed)
Called to room to assist during endoscopic procedure.  Patient ID and intended procedure confirmed with present staff. Received instructions for my participation in the procedure from the performing physician.  

## 2020-07-12 NOTE — Progress Notes (Signed)
VS-CW 

## 2020-07-12 NOTE — Op Note (Signed)
[facility] Patient Name: Mark Dunlap Procedure Date: 07/12/2020 9:36 AM MRN: [id number] Endoscopist: Ladene Artist , MD Age: 59 Referring MD:  Date of Birth: [birth year] Gender: Male Account #: [id number] Procedure:                Colonoscopy Indications:              Surveillance: Personal history of adenomatous                            polyps on last colonoscopy 5 years ago Medicines:                Monitored Anesthesia Care Procedure:                Pre-Anesthesia Assessment:                           - Prior to the procedure, a History and Physical                            was performed, and patient medications and                            allergies were reviewed. The patient's tolerance of                            previous anesthesia was also reviewed. The risks                            and benefits of the procedure and the sedation                            options and risks were discussed with the patient.                            All questions were answered, and informed consent                            was obtained. Prior Anticoagulants: The patient has                            taken Plavix (clopidogrel), last dose was 5 days                            prior to procedure. ASA Grade Assessment: III - A                            patient with severe systemic disease. After                            reviewing the risks and benefits, the patient was                            deemed in satisfactory condition to undergo the  procedure.                           After obtaining informed consent, the colonoscope                            was passed under direct vision. Throughout the                            procedure, the patient's blood pressure, pulse, and                            oxygen saturations were monitored continuously. The                            Colonoscope was introduced through the anus and                             advanced to the the cecum, identified by                            appendiceal orifice and ileocecal valve. The                            ileocecal valve, appendiceal orifice, and rectum                            were photographed. The quality of the bowel                            preparation was good. The colonoscopy was performed                            without difficulty. The patient tolerated the                            procedure well. Scope In: 9:44:32 AM Scope Out: 10:03:28 AM Scope Withdrawal Time: 0 hours 15 minutes 42 seconds  Total Procedure Duration: 0 hours 18 minutes 56 seconds  Findings:                 The perianal and digital rectal examinations were                            normal.                           Two sessile polyps were found in the sigmoid colon.                            The polyps were 5 mm in size. These polyps were                            removed with a cold snare. Resection and retrieval  were complete for 1 of the 2 polyps.                           Internal hemorrhoids were found during                            retroflexion. The hemorrhoids were small and Grade                            I (internal hemorrhoids that do not prolapse).                           The exam was otherwise without abnormality on                            direct and retroflexion views. Complications:            No immediate complications. Estimated blood loss:                            None. Estimated Blood Loss:     Estimated blood loss: none. Impression:               - Two 5 mm polyps in the sigmoid colon, removed                            with a cold snare. Resected and retrieved.                           - Internal hemorrhoids.                           - The examination was otherwise normal on direct                            and retroflexion views. Recommendation:           - Repeat colonoscopy after  studies are complete for                            surveillance based on pathology results.                           - Resume Plavix (clopidogrel) in 2 days at prior                            dose. Refer to managing physician for further                            adjustment of therapy.                           - Patient has a contact number available for                            emergencies. The signs and symptoms of potential  delayed complications were discussed with the                            patient. Return to normal activities tomorrow.                            Written discharge instructions were provided to the                            patient.                           - Resume previous diet.                           - Continue present medications.                           - Await pathology results. Ladene Artist, MD 07/12/2020 10:05:58 AM This report has been signed electronically.

## 2020-07-12 NOTE — Patient Instructions (Signed)
Please read handouts provided. Continue present medications. Await pathology results. Resume Plavix ( clopidogrel ) in 2 days at prior dose.      YOU HAD AN ENDOSCOPIC PROCEDURE TODAY AT [facility]:   Refer to the procedure report that was given to you for any specific questions about what was found during the examination.  If the procedure report does not answer your questions, please call your gastroenterologist to clarify.  If you requested that your care partner not be given the details of your procedure findings, then the procedure report has been included in a sealed envelope for you to review at your convenience later.  YOU SHOULD EXPECT: Some feelings of bloating in the abdomen. Passage of more gas than usual.  Walking can help get rid of the air that was put into your GI tract during the procedure and reduce the bloating. If you had a lower endoscopy (such as a colonoscopy or flexible sigmoidoscopy) you may notice spotting of blood in your stool or on the toilet paper. If you underwent a bowel prep for your procedure, you may not have a normal bowel movement for a few days.  Please Note:  You might notice some irritation and congestion in your nose or some drainage.  This is from the oxygen used during your procedure.  There is no need for concern and it should clear up in a day or so.  SYMPTOMS TO REPORT IMMEDIATELY:   Following lower endoscopy (colonoscopy or flexible sigmoidoscopy):  Excessive amounts of blood in the stool  Significant tenderness or worsening of abdominal pains  Swelling of the abdomen that is new, acute  Fever of 100F or higher   For urgent or emergent issues, a gastroenterologist can be reached at any hour by calling [phone]. Do not use MyChart messaging for urgent concerns.    DIET:  We do recommend a small meal at first, but then you may proceed to your regular diet.  Drink plenty of fluids but you should avoid alcoholic  beverages for 24 hours.  ACTIVITY:  You should plan to take it easy for the rest of today and you should NOT DRIVE or use heavy machinery until tomorrow (because of the sedation medicines used during the test).    FOLLOW UP: Our staff will call the number listed on your records 48-72 hours following your procedure to check on you and address any questions or concerns that you may have regarding the information given to you following your procedure. If we do not reach you, we will leave a message.  We will attempt to reach you two times.  During this call, we will ask if you have developed any symptoms of COVID 19. If you develop any symptoms (ie: fever, flu-like symptoms, shortness of breath, cough etc.) before then, please call [phone].  If you test positive for Covid 19 in the 2 weeks post procedure, please call and report this information to [place].    If any biopsies were taken you will be contacted by phone or by letter within the next 1-3 weeks.  Please call us at [phone] if you have not heard about the biopsies in 3 weeks.    SIGNATURES/CONFIDENTIALITY: You and/or your care partner have signed paperwork which will be entered into your electronic medical record.  These signatures attest to the fact that that the information above on your After Visit Summary has been reviewed and is understood.  Full responsibility of the confidentiality of this  discharge information lies with you and/or your care-partner. 

## 2020-07-14 ENCOUNTER — Other Ambulatory Visit: Payer: Self-pay

## 2020-07-14 ENCOUNTER — Ambulatory Visit (INDEPENDENT_AMBULATORY_CARE_PROVIDER_SITE_OTHER): Payer: Medicare Other | Admitting: Podiatry

## 2020-07-14 ENCOUNTER — Telehealth: Payer: Self-pay | Admitting: *Deleted

## 2020-07-14 ENCOUNTER — Ambulatory Visit (INDEPENDENT_AMBULATORY_CARE_PROVIDER_SITE_OTHER): Payer: Medicare Other

## 2020-07-14 DIAGNOSIS — M19071 Primary osteoarthritis, right ankle and foot: Secondary | ICD-10-CM | POA: Diagnosis not present

## 2020-07-14 DIAGNOSIS — M19072 Primary osteoarthritis, left ankle and foot: Secondary | ICD-10-CM | POA: Diagnosis not present

## 2020-07-14 NOTE — Telephone Encounter (Signed)
  Follow up Call-  Call back number 07/12/2020 07/01/2019  Post procedure Call Back phone  # [phone]  Permission to leave phone message Yes Yes  Some recent data might be hidden     Patient questions:  Do you have a fever, pain , or abdominal swelling? No. Pain Score  0 *  Have you tolerated food without any problems? Yes.    Have you been able to return to your normal activities? Yes.    Do you have any questions about your discharge instructions: Diet   No. Medications  No. Follow up visit  No.  Do you have questions or concerns about your Care? No.  Actions: * If pain score is 4 or above: 1. No action needed, pain <4.Have you developed a fever since your procedure? no  2.   Have you had an respiratory symptoms (SOB or cough) since your procedure? no  3.   Have you tested positive for COVID 19 since your procedure no  4.   Have you had any family members/close contacts diagnosed with the COVID 19 since your procedure?  no   If yes to any of these questions please route to Joylene John, RN and Joella Prince, RN

## 2020-07-15 ENCOUNTER — Other Ambulatory Visit: Payer: Self-pay | Admitting: Internal Medicine

## 2020-07-19 ENCOUNTER — Encounter: Payer: Self-pay | Admitting: Podiatry

## 2020-07-19 NOTE — Progress Notes (Signed)
Subjective:  Patient ID: Mark Dunlap, male    DOB: 11/04/1960,  MRN: [id number]  Chief Complaint  Patient presents with  . Follow-up    59 y.o. male presents with the above complaint.  Patient is here for follow-up of bilateral first metatarsophalangeal joint pain with severe arthrosis.  Patient states that he cannot do surgery at this time as he has to take care of his mother.  However he will consider surgery in the near future when he has his home situation in order.  At this time patient states the injections have been helping.  He would like to continue doing injections for now.  I will continue doing so as long as his improvement every 3 months or so.  Patient states understanding   Review of Systems: Negative except as noted in the HPI. Denies N/V/F/Ch.  Past Medical History:  Diagnosis Date  . Acute prostatitis 05/23/2009  . Adjustment disorder with depressed mood 09/2007  . Cataract    bilateral  . CERVICAL RADICULOPATHY, LEFT 08/30/2008  . CVA (cerebrovascular accident) ([place])   . DJD (degenerative joint disease) of hip   . DJD (degenerative joint disease) of knee    left knee  . GERD (gastroesophageal reflux disease)   . 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 H/O: substance abuse ([place])    hx of ETOH/Crack cocaine-none since 11/08 per pt/Does not Drive due to this  . HYPERTENSION 08/30/2008  . HYPERTHYROIDISM 02/13/2010   pt was told by  Dr Jenny Reichmann  that thyroid was now back to normal .. 2012 ...   . HYPOTENSION 10/31/2009  . 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 Other and unspecified hyperlipidemia 08/20/2013  . PONV (postoperative nausea and vomiting)   . Seasonal allergies   . SPINAL STENOSIS, CERVICAL 08/08/2009  . Stroke ([place])   . THYROID NODULE, RIGHT 03/02/2010    Current Outpatient Medications:  .  acetaminophen (TYLENOL) 500 MG tablet, Take 1,000 mg by  mouth every 6 (six) hours as needed for moderate pain or headache., Disp: , Rfl:  .  adapalene (DIFFERIN) 0.1 % gel, APPLY 1 APPLICATION TOPICALLY AT BEDTIME., Disp: 45 g, Rfl: 1 .  aspirin EC 81 MG tablet, Take 81 mg by mouth daily. Swallow whole., Disp: , Rfl:  .  atorvastatin (LIPITOR) 40 MG tablet, TAKE 1 TABLET BY MOUTH ONCE DAILY AT  6  PM, Disp: 90 tablet, Rfl: 3 .  benzoyl peroxide (BENZOYL PEROXIDE) 5 % external liquid, Apply 1 application topically 2 (two) times daily., Disp: 226 g, Rfl: 0 .  bimatoprost (LUMIGAN) 0.01 % SOLN, Place 1 drop into both eyes at bedtime., Disp: , Rfl:  .  citalopram (CELEXA) 20 MG tablet, Take 1 tablet (20 mg total) by mouth daily., Disp: 90 tablet, Rfl: 3 .  clopidogrel (PLAVIX) 75 MG tablet, Take 1 tablet (75 mg total) by mouth daily., Disp: 90 tablet, Rfl: 3 .  diphenhydrAMINE (BENADRYL) 25 mg capsule, Take 50 mg by mouth every 6 (six) hours as needed for itching. , Disp: , Rfl:  .  famotidine (PEPCID) 40 MG tablet, Take 1 tablet (40 mg total) by mouth 2 (two) times daily. Take every morning and at bedtime, Disp: 60 tablet, Rfl: 3 .  fluticasone (FLONASE) 50 MCG/ACT nasal spray, USE 2 SPRAYS DAILY IN BOTH NOSTRILS, Disp: 32 mL, Rfl: 0 .  gabapentin (NEURONTIN) 100 MG capsule, Take 200 mg by mouth 2 (two) times daily., Disp: , Rfl: 2 .  hydrochlorothiazide (MICROZIDE) 12.5 MG capsule, TAKE 1 CAPSULE BY MOUTH EVERY DAY, Disp: 90 capsule, Rfl: 1 .  HYDROcodone-acetaminophen (NORCO) 10-325 MG per tablet, Take 1 tablet by mouth every 6 (six) hours as needed for moderate pain., Disp: 60 tablet, Rfl: 0 .  imiquimod (ALDARA) 5 % cream, Apply topically 3 (three) times a week., Disp: 24 each, Rfl: 2 .  lidocaine (XYLOCAINE) 2 % solution, TAKE AS DIRECTED, Disp: 450 mL, Rfl: 0 .  nortriptyline (PAMELOR) 25 MG capsule, Take 25 mg by mouth at bedtime., Disp: , Rfl: 0 .  ondansetron (ZOFRAN ODT) 4 MG disintegrating tablet, Take 1 tablet (4 mg total) by mouth every 8  (eight) hours as needed for nausea or vomiting., Disp: 20 tablet, Rfl: 1 .  oxybutynin (DITROPAN) 5 MG tablet, Take 1 tablet (5 mg total) by mouth at bedtime., Disp: 90 tablet, Rfl: 3 .  pantoprazole (PROTONIX) 40 MG tablet, Take 1 tablet (40 mg total) by mouth daily., Disp: 90 tablet, Rfl: 3 .  sucralfate (CARAFATE) 1 g tablet, Take 1 tablet (1 g total) by mouth 3 (three) times daily. Take 20 minutes after meals and at bedtime, Disp: 120 tablet, Rfl: 1 .  tamsulosin (FLOMAX) 0.4 MG CAPS capsule, Take 1 capsule by mouth twice daily, Disp: 180 capsule, Rfl: 3 .  timolol (TIMOPTIC) 0.5 % ophthalmic solution, Place 1 drop into both eyes daily., Disp: , Rfl: 6 .  tiZANidine (ZANAFLEX) 4 MG tablet, TAKE 1 TABLET BY MOUTH EVERY 8 HOURS AS NEEDED(NOT TO EXCEED 3 DOSES IN 24 HOURS), Disp: , Rfl:  .  tretinoin (RETIN-A) 0.1 % cream, APPLY  CREAM TOPICALLY TO AFFECTED AREA OF FACE AT BEDTIME, Disp: 40 g, Rfl: 1 .  zolpidem (AMBIEN) 10 MG tablet, TAKE 1 TABLET BY MOUTH AT BEDTIME AS NEEDED FOR SLEEP, Disp: 90 tablet, Rfl: 1  Social History   Tobacco Use  Smoking Status Current Every Day Smoker  . Packs/day: 0.50  . Years: 40.00  . Pack years: 20.00  . Types: Cigarettes  Smokeless Tobacco Never Used    Allergies  Allergen Reactions  . Tramadol Itching  . Morphine And Related Itching  . Oxycodone Itching   Objective:   There were no vitals filed for this visit. There is no height or weight on file to calculate BMI. Constitutional Well developed. Well nourished.  Vascular Dorsalis pedis pulses palpable bilaterally. Posterior tibial pulses palpable bilaterally. Capillary refill normal to all digits.  No cyanosis or clubbing noted. Pedal hair growth normal.  Neurologic Normal speech. Oriented to person, place, and time. Epicritic sensation to light touch grossly present bilaterally.  Dermatologic Nails well groomed and normal in appearance. No open wounds. No skin lesions.  Orthopedic:   Limited range of motion noted of bilateral first metatarsophalangeal joint with left more painful than right.  Intra-articular pain noted bilaterally.  Pain with end range of motion.  Hallux limitus noted bilaterally.   Radiographs: 2 views of bilaterally skeletally mature adult foot: Severe arthritic changes noted to the right side.  Uneven joint space narrowing noted to the left side.  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  No other bony abnormalities identified mild arthritic changes noted to the dorsal aspect of the midfoot.  Mild elevatus present bilaterally. Assessment:   1. Arthritis of first metatarsophalangeal (MTP) joint of  left foot   2. Arthritis of first metatarsophalangeal (MTP) joint of right foot    Plan:  Patient was evaluated and treated and all questions answered.  Bilateral first metatarsophalangeal joint arthritis with left more painful than right -Given clinically patient is improving and the pain is decreasing with steroid injection I believe he will benefit from another steroid injection to bilateral first metatarsophalangeal joint to help him get to closer to 95% or greater in terms of pain relief.  Patient states understanding would like to proceed with a injection to bilateral first metatarsophalangeal joint.  However at this time I am worried that his pain is not getting resolved and he is needing more injection.  I briefly discussed with him that he will need surgical intervention if this injection does not help.  For now patient states injections do help and once he gets his social situation under control he will proceed with surgery.  Patient states understanding -A steroid injection was performed at bilateral first metatarsophalangeal joint using 1% plain Lidocaine and 10 mg of Kenalog. This was well tolerated. -I will also discussed with him orthotics management with Morton's extension  take some of the stress off of the first metatarsophalangeal joint. -I will continue doing bilateral steroid injection as long as it is controlling his pain.  I will continue to do steroid injection until patient is ready for surgery about every 12 weeks No follow-ups on file.

## 2020-07-24 ENCOUNTER — Telehealth: Payer: Self-pay | Admitting: Emergency Medicine

## 2020-07-24 NOTE — Telephone Encounter (Signed)
Pt called requesting a refill on his lidocaine (XYLOCAINE) 2 % solution. [facility] is [facility] He is also requesting a letter to get out of jury duty due to his spine and also it is in [facility] he is legal blind cant see to get there and doesn't have transportation. Thanks.

## 2020-07-25 ENCOUNTER — Encounter: Payer: Self-pay | Admitting: Gastroenterology

## 2020-07-26 NOTE — Telephone Encounter (Signed)
[place] for letter Verdis Frederickson can you do this thanks

## 2020-07-28 ENCOUNTER — Encounter: Payer: Self-pay | Admitting: Internal Medicine

## 2020-07-28 NOTE — Telephone Encounter (Signed)
Patient called and stated he would come pick it up

## 2020-07-28 NOTE — Telephone Encounter (Signed)
Letter has been written and printed. Tried calling pt to see if he will be picking up the letter or if he wants it mailed. No answer will try again this afternoon.

## 2020-07-31 ENCOUNTER — Other Ambulatory Visit: Payer: Self-pay | Admitting: Internal Medicine

## 2020-08-09 ENCOUNTER — Other Ambulatory Visit: Payer: Self-pay | Admitting: Urology

## 2020-08-09 DIAGNOSIS — R972 Elevated prostate specific antigen [PSA]: Secondary | ICD-10-CM

## 2020-08-24 DIAGNOSIS — M791 Myalgia, unspecified site: Secondary | ICD-10-CM | POA: Insufficient documentation

## 2020-08-31 DIAGNOSIS — S32009K Unspecified fracture of unspecified lumbar vertebra, subsequent encounter for fracture with nonunion: Secondary | ICD-10-CM | POA: Insufficient documentation

## 2020-09-02 ENCOUNTER — Other Ambulatory Visit: Payer: Medicare Other

## 2020-09-05 ENCOUNTER — Other Ambulatory Visit: Payer: Self-pay | Admitting: Neurological Surgery

## 2020-09-06 ENCOUNTER — Encounter (HOSPITAL_COMMUNITY): Payer: Self-pay | Admitting: Neurological Surgery

## 2020-09-06 ENCOUNTER — Other Ambulatory Visit: Payer: Self-pay

## 2020-09-06 NOTE — Progress Notes (Signed)
PCP - Dr Cathlean Cower Cardiologist - n/a Mark Dunlap - Dr Lucio Edward  Chest x-ray - 11/19/19 (2V) EKG - DOS 09/11/20 Stress Test - 08/31/13 ECHO - 10/26/18 Cardiac Cath - n/a  Blood Thinner Instructions:  Last dose of plavix was on 09/04/20.  Aspirin Instructions: Last dose of Aspirin was on 09/04/20.  Anesthesia review: Yes  STOP now taking any Aspirin (unless otherwise instructed by your surgeon), Aleve, Naproxen, Ibuprofen, Motrin, Advil, Goody's, BC's, all herbal medications, fish oil, and all vitamins.   Coronavirus Screening Covid test is scheduled on Sat 09/11/20 Do you have any of the following symptoms:  Cough yes/no: No Fever (>100.57F)  yes/no: No Runny nose yes/no: No Sore throat yes/no: No Difficulty breathing/shortness of breath  yes/no: No  Have you traveled in the last 14 days and where? yes/no: No  Patient verbalized understanding of instructions that were given via phone.

## 2020-09-08 ENCOUNTER — Other Ambulatory Visit (HOSPITAL_COMMUNITY)
Admission: RE | Admit: 2020-09-08 | Discharge: 2020-09-08 | Disposition: A | Discharge status: Home / Self Care | Payer: Medicare Other | Source: Ambulatory Visit | Attending: Neurological Surgery | Admitting: Neurological Surgery

## 2020-09-08 ENCOUNTER — Other Ambulatory Visit (HOSPITAL_COMMUNITY): Payer: Medicare Other

## 2020-09-08 DIAGNOSIS — Z20822 Contact with and (suspected) exposure to covid-19: Secondary | ICD-10-CM | POA: Insufficient documentation

## 2020-09-08 DIAGNOSIS — Z01812 Encounter for preprocedural laboratory examination: Secondary | ICD-10-CM | POA: Insufficient documentation

## 2020-09-08 LAB — SARS CORONAVIRUS 2 (TAT 6-24 HRS): SARS Coronavirus 2: NEGATIVE

## 2020-09-08 NOTE — Progress Notes (Signed)
Anesthesia Chart Review:  Pt is a same day work up   Case: [id number] Date/Time: 09/11/20 1335   Procedures:      Laminectomy and Foraminotomy - L3-L4, posterior lateral fusion L3-4 and L4-5 (N/A Back)     Removal of hardware L4-L5, (N/A Back)   Anesthesia type: General   Pre-op diagnosis: Lumbar pseudoarthrosis   Location: [facility] OR ROOM 20 / [place] [facility]   Surgeons: Eustace Moore, MD      DISCUSSION:  Pt is a 59 year old with hx stroke (10/26/18), HTN  Last dose plavix and ASA 09/04/20   PROVIDERS: - PCP is Biagio Borg, MD   LABS: Will be obtained day of surgery    IMAGES: CXR 11/19/19:  1. Chronic right basilar pleural thickening and scarring. 2. No acute airspace disease   EKG: Will be obtained day of surgery    CV: Echo 10/26/18:  - Left ventricle: The cavity size was normal. Wall thickness was increased in a pattern of mild LVH. Systolic function was normal. The estimated ejection fraction was in the range of 60% to 65%. Wall motion was normal; there were no regional wall motion abnormalities. Left ventricular diastolic function parameters were normal.  - Left atrium: The atrium was normal in size.  - Inferior vena cava: The vessel was normal in size. The respirophasic diameter changes were in the normal range (>= 50%), consistent with normal central venous pressure.  - Impressions:  Normal study   Past Medical History:  Diagnosis Date  . Acute prostatitis 05/23/2009  . Adjustment disorder with depressed mood 09/2007  . Cataract    bilateral - MD just watching  . CERVICAL RADICULOPATHY, LEFT 08/30/2008  . CVA (cerebrovascular accident) ([place])   . Depression   . DJD (degenerative joint disease) of hip   . DJD (degenerative joint disease) of knee    left knee  . GERD (gastroesophageal reflux disease)   . 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 H/O: substance abuse ([place])    hx of ETOH/Crack cocaine-none since 12/2012 per  pt/Does not Drive due to this  . HLD (hyperlipidemia)   . HYPERTENSION 08/30/2008  . HYPERTHYROIDISM 02/13/2010   pt was told by  Dr Jenny Reichmann  that thyroid was now back to normal .. 2012 ...   . HYPOTENSION 10/31/2009  . 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 Other and unspecified hyperlipidemia 08/20/2013  . Seasonal allergies   . SPINAL STENOSIS, CERVICAL 08/08/2009  . Stroke ([place])   . THYROID NODULE, RIGHT 03/02/2010    Past Surgical History:  Procedure Laterality Date  . ANTERIOR CERVICAL DECOMP/DISCECTOMY FUSION  10/11/2011   Procedure: ANTERIOR CERVICAL DECOMPRESSION/DISCECTOMY FUSION 3 LEVELS;  Surgeon: Eustace Moore;  Location: [place] [facility];  Service: Neurosurgery;  Laterality: N/A;  Cervical three-four ,cervical four five cervical five six Anterior Cervical Decompression Fusion with peek + plate Nuvasive translational plate Orthofix peek (2 1/2 hours) Rm # [id number]  . ANTERIOR CERVICAL DECOMP/DISCECTOMY FUSION N/A 07/29/2014   Procedure: ANTERIOR CERVICAL DECOMPRESSION/DISCECTOMY FUSION 1 LEVEL/HARDWARE REMOVAL;  Surgeon: Eustace Moore, MD;  Location: [place] [facility];  Service: Neurosurgery;  Laterality: N/A;  cervical six-seven  . BACK SURGERY  2019   [facility]  . COLONOSCOPY    . POSTERIOR CERVICAL FUSION/FORAMINOTOMY N/A 09/23/2013   Procedure: CERVICALTWO TO CERVICAL SEVEN POSTERIOR CERVICAL FUSION/FORAMINOTOMY WITH  LATERAL MASS FIXATION;  Surgeon: Eustace Moore, MD;  Location: [place] [facility];  Service: Neurosurgery;  Laterality: N/A;  . Right Hand I&D  12/07   s/p rdue to abscess-Dr. Amedeo Plenty  . TOTAL HIP ARTHROPLASTY  12/17/2011   Procedure: TOTAL HIP ARTHROPLASTY;  Surgeon: Johnny Bridge, MD;  Location: [place];  Service: Orthopedics;  Laterality: Left;    MEDICATIONS: No current facility-administered medications for this encounter.   [name] acetaminophen (TYLENOL) 500 MG tablet  . adapalene (DIFFERIN) 0.1 % gel  . aspirin EC 81 MG tablet   . atorvastatin (LIPITOR) 40 MG tablet  . benzoyl peroxide (BENZOYL PEROXIDE) 5 % external liquid  . bimatoprost (LUMIGAN) 0.01 % SOLN  . citalopram (CELEXA) 20 MG tablet  . clopidogrel (PLAVIX) 75 MG tablet  . diphenhydrAMINE (BENADRYL) 25 mg capsule  . famotidine (PEPCID) 40 MG tablet  . fluticasone (FLONASE) 50 MCG/ACT nasal spray  . gabapentin (NEURONTIN) 100 MG capsule  . hydrochlorothiazide (MICROZIDE) 12.5 MG capsule  . HYDROcodone-acetaminophen (NORCO) 10-325 MG per tablet  . imiquimod (ALDARA) 5 % cream  . lidocaine (XYLOCAINE) 2 % solution  . nortriptyline (PAMELOR) 25 MG capsule  . ondansetron (ZOFRAN ODT) 4 MG disintegrating tablet  . oxybutynin (DITROPAN) 5 MG tablet  . pantoprazole (PROTONIX) 40 MG tablet  . tamsulosin (FLOMAX) 0.4 MG CAPS capsule  . timolol (TIMOPTIC) 0.5 % ophthalmic solution  . tiZANidine (ZANAFLEX) 4 MG tablet  . tretinoin (RETIN-A) 0.1 % cream  . zolpidem (AMBIEN) 10 MG tablet  . sucralfate (CARAFATE) 1 g tablet   - Last dose plavix and ASA 09/04/20   If labs and EKG acceptable day of surgery, I anticipate pt can proceed with surgery as scheduled.  Willeen Cass, PhD, FNP-BC [facility]/Anesthesiology Phone: [phone] 09/08/2020 3:48 PM

## 2020-09-08 NOTE — Anesthesia Preprocedure Evaluation (Addendum)
Anesthesia Evaluation  Patient identified by MRN, date of birth, ID band Patient awake    Reviewed: Allergy & Precautions, NPO status , Patient's Chart, lab work & pertinent test results  Airway Mallampati: II  TM Distance: >3 FB Neck ROM: Limited    Dental  (+) Edentulous Upper, Edentulous Lower   Pulmonary Current Smoker,    breath sounds clear to auscultation       Cardiovascular hypertension,  Rhythm:Regular Rate:Normal     Neuro/Psych    GI/Hepatic   Endo/Other    Renal/GU      Musculoskeletal   Abdominal   Peds  Hematology   Anesthesia Other Findings   Reproductive/Obstetrics                            Anesthesia Physical Anesthesia Plan  ASA: III  Anesthesia Plan: General   Post-op Pain Management:    Induction: Intravenous  PONV Risk Score and Plan: Ondansetron and Dexamethasone  Airway Management Planned: Oral ETT  Additional Equipment:   Intra-op Plan:   Post-operative Plan: Extubation in OR  Informed Consent: I have reviewed the patients History and Physical, chart, labs and discussed the procedure including the risks, benefits and alternatives for the proposed anesthesia with the patient or authorized representative who has indicated his/her understanding and acceptance.       Plan Discussed with: CRNA and Anesthesiologist  Anesthesia Plan Comments: (See APP note by Durel Salts, FNP )       Anesthesia Quick Evaluation

## 2020-09-09 ENCOUNTER — Other Ambulatory Visit (HOSPITAL_COMMUNITY): Payer: Medicare Other

## 2020-09-11 ENCOUNTER — Inpatient Hospital Stay (HOSPITAL_COMMUNITY): Payer: Medicare Other | Admitting: Emergency Medicine

## 2020-09-11 ENCOUNTER — Encounter (HOSPITAL_COMMUNITY)
Admission: RE | Disposition: A | Discharge status: Home / Self Care | Payer: Self-pay | Source: Home / Self Care | Attending: Neurological Surgery

## 2020-09-11 ENCOUNTER — Inpatient Hospital Stay (HOSPITAL_COMMUNITY): Payer: Medicare Other

## 2020-09-11 ENCOUNTER — Encounter (HOSPITAL_COMMUNITY): Payer: Self-pay | Admitting: Neurological Surgery

## 2020-09-11 ENCOUNTER — Other Ambulatory Visit: Payer: Self-pay

## 2020-09-11 ENCOUNTER — Inpatient Hospital Stay (HOSPITAL_COMMUNITY)
Admission: RE | Admit: 2020-09-11 | Discharge: 2020-09-12 | DRG: 460 | Disposition: A | Discharge status: Home / Self Care | Payer: Medicare Other | Attending: Neurological Surgery | Admitting: Neurological Surgery

## 2020-09-11 DIAGNOSIS — Z96642 Presence of left artificial hip joint: Secondary | ICD-10-CM | POA: Diagnosis present

## 2020-09-11 DIAGNOSIS — Z7982 Long term (current) use of aspirin: Secondary | ICD-10-CM

## 2020-09-11 DIAGNOSIS — Z9889 Other specified postprocedural states: Secondary | ICD-10-CM

## 2020-09-11 DIAGNOSIS — Z8261 Family history of arthritis: Secondary | ICD-10-CM | POA: Diagnosis not present

## 2020-09-11 DIAGNOSIS — H548 Legal blindness, as defined in USA: Secondary | ICD-10-CM | POA: Diagnosis present

## 2020-09-11 DIAGNOSIS — H409 Unspecified glaucoma: Secondary | ICD-10-CM | POA: Diagnosis present

## 2020-09-11 DIAGNOSIS — F17210 Nicotine dependence, cigarettes, uncomplicated: Secondary | ICD-10-CM | POA: Diagnosis present

## 2020-09-11 DIAGNOSIS — M4802 Spinal stenosis, cervical region: Secondary | ICD-10-CM | POA: Diagnosis present

## 2020-09-11 DIAGNOSIS — Z8249 Family history of ischemic heart disease and other diseases of the circulatory system: Secondary | ICD-10-CM

## 2020-09-11 DIAGNOSIS — F4321 Adjustment disorder with depressed mood: Secondary | ICD-10-CM | POA: Diagnosis present

## 2020-09-11 DIAGNOSIS — Z823 Family history of stroke: Secondary | ICD-10-CM

## 2020-09-11 DIAGNOSIS — K219 Gastro-esophageal reflux disease without esophagitis: Secondary | ICD-10-CM | POA: Diagnosis present

## 2020-09-11 DIAGNOSIS — Z20822 Contact with and (suspected) exposure to covid-19: Secondary | ICD-10-CM | POA: Diagnosis present

## 2020-09-11 DIAGNOSIS — Z885 Allergy status to narcotic agent status: Secondary | ICD-10-CM | POA: Diagnosis not present

## 2020-09-11 DIAGNOSIS — M48061 Spinal stenosis, lumbar region without neurogenic claudication: Secondary | ICD-10-CM | POA: Diagnosis present

## 2020-09-11 DIAGNOSIS — M7138 Other bursal cyst, other site: Secondary | ICD-10-CM | POA: Diagnosis present

## 2020-09-11 DIAGNOSIS — E785 Hyperlipidemia, unspecified: Secondary | ICD-10-CM | POA: Diagnosis present

## 2020-09-11 DIAGNOSIS — Z83438 Family history of other disorder of lipoprotein metabolism and other lipidemia: Secondary | ICD-10-CM

## 2020-09-11 DIAGNOSIS — Z8673 Personal history of transient ischemic attack (TIA), and cerebral infarction without residual deficits: Secondary | ICD-10-CM | POA: Diagnosis not present

## 2020-09-11 DIAGNOSIS — M532X6 Spinal instabilities, lumbar region: Secondary | ICD-10-CM | POA: Diagnosis present

## 2020-09-11 DIAGNOSIS — Y828 Other medical devices associated with adverse incidents: Secondary | ICD-10-CM | POA: Diagnosis present

## 2020-09-11 DIAGNOSIS — Z833 Family history of diabetes mellitus: Secondary | ICD-10-CM

## 2020-09-11 DIAGNOSIS — T84226A Displacement of internal fixation device of vertebrae, initial encounter: Secondary | ICD-10-CM | POA: Diagnosis present

## 2020-09-11 DIAGNOSIS — Z981 Arthrodesis status: Secondary | ICD-10-CM | POA: Diagnosis not present

## 2020-09-11 DIAGNOSIS — I10 Essential (primary) hypertension: Secondary | ICD-10-CM | POA: Diagnosis present

## 2020-09-11 DIAGNOSIS — Z419 Encounter for procedure for purposes other than remedying health state, unspecified: Secondary | ICD-10-CM

## 2020-09-11 DIAGNOSIS — M960 Pseudarthrosis after fusion or arthrodesis: Secondary | ICD-10-CM | POA: Diagnosis present

## 2020-09-11 DIAGNOSIS — Z79899 Other long term (current) drug therapy: Secondary | ICD-10-CM | POA: Diagnosis not present

## 2020-09-11 HISTORY — DX: Hyperlipidemia, unspecified: E78.5

## 2020-09-11 HISTORY — PX: LAMINECTOMY WITH POSTERIOR LATERAL ARTHRODESIS LEVEL 2: SHX6336

## 2020-09-11 HISTORY — PX: HARDWARE REMOVAL: SHX979

## 2020-09-11 HISTORY — DX: Depression, unspecified: F32.A

## 2020-09-11 LAB — TYPE AND SCREEN
ABO/RH(D): B POS
Antibody Screen: NEGATIVE

## 2020-09-11 LAB — CBC WITH DIFFERENTIAL/PLATELET
Abs Immature Granulocytes: 0.02 10*3/uL (ref 0.00–0.07)
Basophils Absolute: 0.1 10*3/uL (ref 0.0–0.1)
Basophils Relative: 1 %
Eosinophils Absolute: 0.3 10*3/uL (ref 0.0–0.5)
Eosinophils Relative: 4 %
HCT: 38.5 % — ABNORMAL LOW (ref 39.0–52.0)
Hemoglobin: 11.9 g/dL — ABNORMAL LOW (ref 13.0–17.0)
Immature Granulocytes: 0 %
Lymphocytes Relative: 23 %
Lymphs Abs: 1.5 10*3/uL (ref 0.7–4.0)
MCH: 29.8 pg (ref 26.0–34.0)
MCHC: 30.9 g/dL (ref 30.0–36.0)
MCV: 96.3 fL (ref 80.0–100.0)
Monocytes Absolute: 0.8 10*3/uL (ref 0.1–1.0)
Monocytes Relative: 12 %
Neutro Abs: 4 10*3/uL (ref 1.7–7.7)
Neutrophils Relative %: 60 %
Platelets: 315 10*3/uL (ref 150–400)
RBC: 4 MIL/uL — ABNORMAL LOW (ref 4.22–5.81)
RDW: 13.8 % (ref 11.5–15.5)
WBC: 6.7 10*3/uL (ref 4.0–10.5)
nRBC: 0 % (ref 0.0–0.2)

## 2020-09-11 LAB — BASIC METABOLIC PANEL
Anion gap: 6 (ref 5–15)
BUN: 6 mg/dL (ref 6–20)
CO2: 27 mmol/L (ref 22–32)
Calcium: 8.9 mg/dL (ref 8.9–10.3)
Chloride: 106 mmol/L (ref 98–111)
Creatinine, Ser: 0.93 mg/dL (ref 0.61–1.24)
GFR, Estimated: >60 mL/min (ref >60)
Glucose, Bld: 88 mg/dL (ref 70–99)
Potassium: 4.1 mmol/L (ref 3.5–5.1)
Sodium: 139 mmol/L (ref 135–145)

## 2020-09-11 LAB — PROTIME-INR
INR: 1 (ref 0.8–1.2)
Prothrombin Time: 12.9 seconds (ref 11.4–15.2)

## 2020-09-11 SURGERY — LAMINECTOMY WITH POSTERIOR LATERAL ARTHRODESIS LEVEL 2
Anesthesia: General | Site: Back

## 2020-09-11 MED ORDER — TAMSULOSIN HCL 0.4 MG PO CAPS
0.4000 mg | ORAL_CAPSULE | Freq: Every day | ORAL | Status: DC
  Filled 2020-09-11: qty 1

## 2020-09-11 MED ORDER — ONDANSETRON HCL 4 MG/2ML IJ SOLN
INTRAMUSCULAR | Status: AC
  Filled 2020-09-11: qty 2

## 2020-09-11 MED ORDER — ALBUMIN HUMAN 5 % IV SOLN: INTRAVENOUS | Status: DC | PRN

## 2020-09-11 MED ORDER — CELECOXIB 200 MG PO CAPS
200.0000 mg | ORAL_CAPSULE | Freq: Two times a day (BID) | ORAL | Status: DC
  Administered 2020-09-12: 200 mg via ORAL
  Filled 2020-09-11 (×2): qty 1

## 2020-09-11 MED ORDER — PROPOFOL 10 MG/ML IV BOLUS
INTRAVENOUS | Status: DC | PRN
  Administered 2020-09-11: 190 mg via INTRAVENOUS

## 2020-09-11 MED ORDER — CITALOPRAM HYDROBROMIDE 20 MG PO TABS
20.0000 mg | ORAL_TABLET | Freq: Every day | ORAL | Status: DC
  Administered 2020-09-11: 20 mg via ORAL
  Filled 2020-09-11: qty 1

## 2020-09-11 MED ORDER — PHENYLEPHRINE HCL-NACL 10-0.9 MG/250ML-% IV SOLN
INTRAVENOUS | Status: DC | PRN
  Administered 2020-09-11: 40 ug/min via INTRAVENOUS

## 2020-09-11 MED ORDER — LIDOCAINE HCL (PF) 2 % IJ SOLN
INTRAMUSCULAR | Status: AC
  Filled 2020-09-11: qty 5

## 2020-09-11 MED ORDER — VANCOMYCIN HCL 1000 MG IV SOLR
INTRAVENOUS | Status: AC
  Filled 2020-09-11: qty 1000

## 2020-09-11 MED ORDER — ASPIRIN EC 81 MG PO TBEC: 81.0000 mg | DELAYED_RELEASE_TABLET | Freq: Every day | ORAL | Status: DC

## 2020-09-11 MED ORDER — THROMBIN 5000 UNITS EX SOLR
OROMUCOSAL | Status: DC | PRN
  Administered 2020-09-11: 5 mL via TOPICAL

## 2020-09-11 MED ORDER — ONDANSETRON 4 MG PO TBDP: 4.0000 mg | ORAL_TABLET | Freq: Three times a day (TID) | ORAL | Status: DC | PRN

## 2020-09-11 MED ORDER — FAMOTIDINE 20 MG PO TABS
20.0000 mg | ORAL_TABLET | Freq: Two times a day (BID) | ORAL | Status: DC
  Filled 2020-09-11: qty 1

## 2020-09-11 MED ORDER — ACETAMINOPHEN 325 MG PO TABS: 650.0000 mg | ORAL_TABLET | ORAL | Status: DC | PRN

## 2020-09-11 MED ORDER — ORAL CARE MOUTH RINSE: 15.0000 mL | Freq: Once | OROMUCOSAL | Status: AC

## 2020-09-11 MED ORDER — CHLORHEXIDINE GLUCONATE 0.12 % MT SOLN: 15.0000 mL | Freq: Once | OROMUCOSAL | Status: AC

## 2020-09-11 MED ORDER — MIDAZOLAM HCL 2 MG/2ML IJ SOLN
INTRAMUSCULAR | Status: AC
  Filled 2020-09-11: qty 2

## 2020-09-11 MED ORDER — HYDROCODONE-ACETAMINOPHEN 10-325 MG PO TABS
1.0000 | ORAL_TABLET | Freq: Four times a day (QID) | ORAL | Status: DC | PRN
  Administered 2020-09-11: 1 via ORAL

## 2020-09-11 MED ORDER — 0.9 % SODIUM CHLORIDE (POUR BTL) OPTIME
TOPICAL | Status: DC | PRN
  Administered 2020-09-11: 1000 mL

## 2020-09-11 MED ORDER — FENTANYL CITRATE (PF) 100 MCG/2ML IJ SOLN
25.0000 ug | INTRAMUSCULAR | Status: DC | PRN
  Administered 2020-09-11: 50 ug via INTRAVENOUS

## 2020-09-11 MED ORDER — ONDANSETRON HCL 4 MG/2ML IJ SOLN: 4.0000 mg | Freq: Once | INTRAMUSCULAR | Status: DC | PRN

## 2020-09-11 MED ORDER — CHLORHEXIDINE GLUCONATE CLOTH 2 % EX PADS: 6.0000 | MEDICATED_PAD | Freq: Once | CUTANEOUS | Status: DC

## 2020-09-11 MED ORDER — GABAPENTIN 100 MG PO CAPS
200.0000 mg | ORAL_CAPSULE | Freq: Two times a day (BID) | ORAL | Status: DC
  Administered 2020-09-11: 200 mg via ORAL
  Filled 2020-09-11: qty 2

## 2020-09-11 MED ORDER — NORTRIPTYLINE HCL 25 MG PO CAPS
25.0000 mg | ORAL_CAPSULE | Freq: Every day | ORAL | Status: DC
  Administered 2020-09-11: 25 mg via ORAL
  Filled 2020-09-11 (×2): qty 1

## 2020-09-11 MED ORDER — HEPARIN SODIUM (PORCINE) 1000 UNIT/ML IJ SOLN
INTRAMUSCULAR | Status: AC
  Filled 2020-09-11: qty 1

## 2020-09-11 MED ORDER — HYDROCHLOROTHIAZIDE 12.5 MG PO CAPS
12.5000 mg | ORAL_CAPSULE | Freq: Every day | ORAL | Status: DC
  Administered 2020-09-11: 12.5 mg via ORAL
  Filled 2020-09-11: qty 1

## 2020-09-11 MED ORDER — ROCURONIUM BROMIDE 10 MG/ML (PF) SYRINGE
PREFILLED_SYRINGE | INTRAVENOUS | Status: AC
  Filled 2020-09-11: qty 10

## 2020-09-11 MED ORDER — SODIUM CHLORIDE 0.9 % IV SOLN: 250.0000 mL | INTRAVENOUS | Status: DC

## 2020-09-11 MED ORDER — BUPIVACAINE HCL (PF) 0.25 % IJ SOLN
INTRAMUSCULAR | Status: AC
  Filled 2020-09-11: qty 30

## 2020-09-11 MED ORDER — THROMBIN 20000 UNITS EX SOLR
CUTANEOUS | Status: AC
  Filled 2020-09-11: qty 20000

## 2020-09-11 MED ORDER — OXYBUTYNIN CHLORIDE 5 MG PO TABS
5.0000 mg | ORAL_TABLET | Freq: Every day | ORAL | Status: DC
  Filled 2020-09-11 (×2): qty 1

## 2020-09-11 MED ORDER — DEXAMETHASONE SODIUM PHOSPHATE 10 MG/ML IJ SOLN
INTRAMUSCULAR | Status: DC | PRN
  Administered 2020-09-11: 5 mg via INTRAVENOUS

## 2020-09-11 MED ORDER — ADAPALENE 0.1 % EX GEL: 1.0000 "application " | Freq: Every day | CUTANEOUS | Status: DC

## 2020-09-11 MED ORDER — TIMOLOL MALEATE 0.5 % OP SOLN
1.0000 [drp] | Freq: Every day | OPHTHALMIC | Status: DC
  Filled 2020-09-11: qty 5

## 2020-09-11 MED ORDER — LACTATED RINGERS IV SOLN: INTRAVENOUS | Status: DC | PRN

## 2020-09-11 MED ORDER — FENTANYL CITRATE (PF) 250 MCG/5ML IJ SOLN
INTRAMUSCULAR | Status: AC
  Filled 2020-09-11: qty 5

## 2020-09-11 MED ORDER — CEFAZOLIN SODIUM-DEXTROSE 2-4 GM/100ML-% IV SOLN
2.0000 g | Freq: Three times a day (TID) | INTRAVENOUS | Status: AC
  Administered 2020-09-11: 2 g via INTRAVENOUS
  Filled 2020-09-11: qty 100

## 2020-09-11 MED ORDER — PHENYLEPHRINE 40 MCG/ML (10ML) SYRINGE FOR IV PUSH (FOR BLOOD PRESSURE SUPPORT): PREFILLED_SYRINGE | INTRAVENOUS | Status: DC | PRN

## 2020-09-11 MED ORDER — ACETAMINOPHEN 500 MG PO TABS
1000.0000 mg | ORAL_TABLET | ORAL | Status: AC
  Administered 2020-09-11: 1000 mg via ORAL
  Filled 2020-09-11: qty 2

## 2020-09-11 MED ORDER — SODIUM CHLORIDE 0.9% FLUSH: 3.0000 mL | Freq: Two times a day (BID) | INTRAVENOUS | Status: DC

## 2020-09-11 MED ORDER — LATANOPROST 0.005 % OP SOLN
1.0000 [drp] | Freq: Every day | OPHTHALMIC | Status: DC
  Filled 2020-09-11: qty 2.5

## 2020-09-11 MED ORDER — MIDAZOLAM HCL 2 MG/2ML IJ SOLN
INTRAMUSCULAR | Status: DC | PRN
  Administered 2020-09-11: 2 mg via INTRAVENOUS

## 2020-09-11 MED ORDER — ACETAMINOPHEN 650 MG RE SUPP: 650.0000 mg | RECTAL | Status: DC | PRN

## 2020-09-11 MED ORDER — SUGAMMADEX SODIUM 200 MG/2ML IV SOLN
INTRAVENOUS | Status: DC | PRN
  Administered 2020-09-11: 160 mg via INTRAVENOUS

## 2020-09-11 MED ORDER — POTASSIUM CHLORIDE IN NACL 20-0.9 MEQ/L-% IV SOLN: INTRAVENOUS | Status: DC

## 2020-09-11 MED ORDER — TAMSULOSIN HCL 0.4 MG PO CAPS
0.4000 mg | ORAL_CAPSULE | Freq: Two times a day (BID) | ORAL | Status: DC
  Administered 2020-09-11 – 2020-09-12 (×2): 0.4 mg via ORAL
  Filled 2020-09-11 (×2): qty 1

## 2020-09-11 MED ORDER — PHENOL 1.4 % MT LIQD: 1.0000 | OROMUCOSAL | Status: DC | PRN

## 2020-09-11 MED ORDER — FENTANYL CITRATE (PF) 100 MCG/2ML IJ SOLN
INTRAMUSCULAR | Status: AC
  Administered 2020-09-11: 50 ug via INTRAVENOUS
  Filled 2020-09-11: qty 2

## 2020-09-11 MED ORDER — PANTOPRAZOLE SODIUM 40 MG PO TBEC
40.0000 mg | DELAYED_RELEASE_TABLET | Freq: Every day | ORAL | Status: DC
  Administered 2020-09-11: 40 mg via ORAL
  Filled 2020-09-11: qty 1

## 2020-09-11 MED ORDER — SODIUM CHLORIDE 0.9% FLUSH: 3.0000 mL | INTRAVENOUS | Status: DC | PRN

## 2020-09-11 MED ORDER — ROCURONIUM BROMIDE 10 MG/ML (PF) SYRINGE
PREFILLED_SYRINGE | INTRAVENOUS | Status: DC | PRN
  Administered 2020-09-11: 60 mg via INTRAVENOUS
  Administered 2020-09-11: 10 mg via INTRAVENOUS

## 2020-09-11 MED ORDER — BENZOYL PEROXIDE 5 % EX LIQD: 1.0000 "application " | Freq: Two times a day (BID) | CUTANEOUS | Status: DC

## 2020-09-11 MED ORDER — CHLORHEXIDINE GLUCONATE 0.12 % MT SOLN
OROMUCOSAL | Status: AC
  Administered 2020-09-11: 15 mL via OROMUCOSAL
  Filled 2020-09-11: qty 15

## 2020-09-11 MED ORDER — THROMBIN 20000 UNITS EX SOLR
CUTANEOUS | Status: DC | PRN
  Administered 2020-09-11: 20 mL via TOPICAL

## 2020-09-11 MED ORDER — HYDROCODONE-ACETAMINOPHEN 10-325 MG PO TABS
1.0000 | ORAL_TABLET | ORAL | Status: DC | PRN
  Administered 2020-09-11 – 2020-09-12 (×4): 1 via ORAL
  Filled 2020-09-11 (×3): qty 1

## 2020-09-11 MED ORDER — ONDANSETRON HCL 4 MG PO TABS: 4.0000 mg | ORAL_TABLET | Freq: Four times a day (QID) | ORAL | Status: DC | PRN

## 2020-09-11 MED ORDER — ONDANSETRON HCL 4 MG/2ML IJ SOLN: 4.0000 mg | Freq: Four times a day (QID) | INTRAMUSCULAR | Status: DC | PRN

## 2020-09-11 MED ORDER — ONDANSETRON HCL 4 MG/2ML IJ SOLN
INTRAMUSCULAR | Status: DC | PRN
  Administered 2020-09-11: 4 mg via INTRAVENOUS

## 2020-09-11 MED ORDER — SENNA 8.6 MG PO TABS
1.0000 | ORAL_TABLET | Freq: Two times a day (BID) | ORAL | Status: DC
  Filled 2020-09-11: qty 1

## 2020-09-11 MED ORDER — MORPHINE SULFATE (PF) 2 MG/ML IV SOLN: 2.0000 mg | INTRAVENOUS | Status: DC | PRN

## 2020-09-11 MED ORDER — FENTANYL CITRATE (PF) 250 MCG/5ML IJ SOLN
INTRAMUSCULAR | Status: DC | PRN
  Administered 2020-09-11 (×2): 50 ug via INTRAVENOUS
  Administered 2020-09-11: 100 ug via INTRAVENOUS

## 2020-09-11 MED ORDER — LACTATED RINGERS IV SOLN: INTRAVENOUS | Status: DC

## 2020-09-11 MED ORDER — THROMBIN 5000 UNITS EX SOLR
CUTANEOUS | Status: AC
  Filled 2020-09-11: qty 5000

## 2020-09-11 MED ORDER — EPHEDRINE SULFATE-NACL 50-0.9 MG/10ML-% IV SOSY
PREFILLED_SYRINGE | INTRAVENOUS | Status: DC | PRN
  Administered 2020-09-11 (×3): 5 mg via INTRAVENOUS

## 2020-09-11 MED ORDER — GABAPENTIN 300 MG PO CAPS
300.0000 mg | ORAL_CAPSULE | ORAL | Status: DC
  Filled 2020-09-11: qty 1

## 2020-09-11 MED ORDER — BUPIVACAINE HCL (PF) 0.25 % IJ SOLN
INTRAMUSCULAR | Status: DC | PRN
  Administered 2020-09-11: 6 mL

## 2020-09-11 MED ORDER — DIPHENHYDRAMINE HCL 25 MG PO CAPS
50.0000 mg | ORAL_CAPSULE | Freq: Four times a day (QID) | ORAL | Status: DC | PRN
  Administered 2020-09-11 – 2020-09-12 (×2): 50 mg via ORAL
  Filled 2020-09-11 (×2): qty 2

## 2020-09-11 MED ORDER — TIZANIDINE HCL 4 MG PO TABS
4.0000 mg | ORAL_TABLET | Freq: Three times a day (TID) | ORAL | Status: DC | PRN
  Administered 2020-09-11 – 2020-09-12 (×2): 4 mg via ORAL
  Filled 2020-09-11 (×2): qty 1

## 2020-09-11 MED ORDER — HYDROCODONE-ACETAMINOPHEN 10-325 MG PO TABS
ORAL_TABLET | ORAL | Status: AC
  Filled 2020-09-11: qty 1

## 2020-09-11 MED ORDER — MENTHOL 3 MG MT LOZG: 1.0000 | LOZENGE | OROMUCOSAL | Status: DC | PRN

## 2020-09-11 MED ORDER — DEXAMETHASONE SODIUM PHOSPHATE 10 MG/ML IJ SOLN: 10.0000 mg | Freq: Once | INTRAMUSCULAR | Status: DC

## 2020-09-11 MED ORDER — PROPOFOL 10 MG/ML IV BOLUS
INTRAVENOUS | Status: AC
  Filled 2020-09-11: qty 20

## 2020-09-11 MED ORDER — CEFAZOLIN SODIUM-DEXTROSE 2-4 GM/100ML-% IV SOLN
2.0000 g | INTRAVENOUS | Status: AC
  Administered 2020-09-11: 2 g via INTRAVENOUS
  Filled 2020-09-11: qty 100

## 2020-09-11 SURGICAL SUPPLY — 55 items
BAND RUBBER #18 3X1/16 STRL (MISCELLANEOUS) IMPLANT
BASKET BONE COLLECTION (BASKET) IMPLANT
BENZOIN TINCTURE PRP APPL 2/3 (GAUZE/BANDAGES/DRESSINGS) ×2 IMPLANT
BLADE CLIPPER SURG (BLADE) IMPLANT
BONE CANC CHIPS 40CC CAN1/2 (Bone Implant) ×2 IMPLANT
BUR CARBIDE MATCH 3.0 (BURR) ×2 IMPLANT
CANISTER SUCT 3000ML PPV (MISCELLANEOUS) ×2 IMPLANT
CHIPS CANC BONE 40CC CAN1/2 (Bone Implant) ×1 IMPLANT
CNTNR URN SCR LID CUP LEK RST (MISCELLANEOUS) ×1 IMPLANT
CONT SPEC 4OZ STRL OR WHT (MISCELLANEOUS) ×2
COVER BACK TABLE 60X90IN (DRAPES) ×2 IMPLANT
COVER WAND RF STERILE (DRAPES) IMPLANT
DERMABOND ADVANCED (GAUZE/BANDAGES/DRESSINGS) ×1
DERMABOND ADVANCED .7 DNX12 (GAUZE/BANDAGES/DRESSINGS) ×1 IMPLANT
DIFFUSER DRILL AIR PNEUMATIC (MISCELLANEOUS) IMPLANT
DRAPE C-ARM 42X72 X-RAY (DRAPES) ×2 IMPLANT
DRAPE LAPAROTOMY 100X72X124 (DRAPES) ×2 IMPLANT
DRAPE MICROSCOPE LEICA (MISCELLANEOUS) IMPLANT
DRAPE SURG 17X23 STRL (DRAPES) ×2 IMPLANT
DRSG OPSITE POSTOP 4X6 (GAUZE/BANDAGES/DRESSINGS) ×2 IMPLANT
DURAPREP 26ML APPLICATOR (WOUND CARE) ×2 IMPLANT
ELECT REM PT RETURN 9FT ADLT (ELECTROSURGICAL) ×2
ELECTRODE REM PT RTRN 9FT ADLT (ELECTROSURGICAL) ×1 IMPLANT
EVACUATOR 1/8 PVC DRAIN (DRAIN) ×2 IMPLANT
GAUZE 4X4 16PLY RFD (DISPOSABLE) IMPLANT
GLOVE BIO SURGEON STRL SZ7 (GLOVE) IMPLANT
GLOVE BIO SURGEON STRL SZ8 (GLOVE) ×6 IMPLANT
GLOVE BIOGEL PI IND STRL 7.0 (GLOVE) IMPLANT
GLOVE BIOGEL PI INDICATOR 7.0 (GLOVE)
GOWN STRL REUS W/ TWL LRG LVL3 (GOWN DISPOSABLE) IMPLANT
GOWN STRL REUS W/ TWL XL LVL3 (GOWN DISPOSABLE) ×3 IMPLANT
GOWN STRL REUS W/TWL 2XL LVL3 (GOWN DISPOSABLE) IMPLANT
GOWN STRL REUS W/TWL LRG LVL3 (GOWN DISPOSABLE)
GOWN STRL REUS W/TWL XL LVL3 (GOWN DISPOSABLE) ×6
GRAFT BONE PROTEIOS LRG 5CC (Orthopedic Implant) ×4 IMPLANT
HEMOSTAT POWDER KIT SURGIFOAM (HEMOSTASIS) ×2 IMPLANT
KIT BASIN OR (CUSTOM PROCEDURE TRAY) ×2 IMPLANT
KIT TURNOVER KIT B (KITS) ×2 IMPLANT
NEEDLE HYPO 25X1 1.5 SAFETY (NEEDLE) ×2 IMPLANT
NEEDLE SPNL 20GX3.5 QUINCKE YW (NEEDLE) IMPLANT
NS IRRIG 1000ML POUR BTL (IV SOLUTION) ×2 IMPLANT
PACK LAMINECTOMY NEURO (CUSTOM PROCEDURE TRAY) ×2 IMPLANT
PAD ARMBOARD 7.5X6 YLW CONV (MISCELLANEOUS) ×6 IMPLANT
SPONGE LAP 4X18 RFD (DISPOSABLE) IMPLANT
SPONGE SURGIFOAM ABS GEL 100 (HEMOSTASIS) ×2 IMPLANT
SPONGE SURGIFOAM ABS GEL SZ50 (HEMOSTASIS) IMPLANT
STRIP CLOSURE SKIN 1/2X4 (GAUZE/BANDAGES/DRESSINGS) ×2 IMPLANT
SUT VIC AB 0 CT1 18XCR BRD8 (SUTURE) ×1 IMPLANT
SUT VIC AB 0 CT1 8-18 (SUTURE) ×2
SUT VIC AB 2-0 CP2 18 (SUTURE) ×2 IMPLANT
SUT VIC AB 3-0 SH 8-18 (SUTURE) ×2 IMPLANT
TOWEL GREEN STERILE (TOWEL DISPOSABLE) ×2 IMPLANT
TOWEL GREEN STERILE FF (TOWEL DISPOSABLE) ×2 IMPLANT
TRAY FOLEY MTR SLVR 16FR STAT (SET/KITS/TRAYS/PACK) IMPLANT
WATER STERILE IRR 1000ML POUR (IV SOLUTION) ×2 IMPLANT

## 2020-09-11 NOTE — H&P (Signed)
Subjective: Patient is a 59 y.o. 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 Onset of symptoms was several months ago, gradually worsening since that time.  The pain is rated severe, and is located at the across the lower back and radiates to legs. The pain is described as aching and occurs all day. The symptoms have been progressive. Symptoms are exacerbated by exercise. MRI or CT showed as ablove   Past Medical History:  Diagnosis Date  . Acute prostatitis 05/23/2009  . Adjustment disorder with depressed mood 09/2007  . Cataract    bilateral - MD just watching  . CERVICAL RADICULOPATHY, LEFT 08/30/2008  . CVA (cerebrovascular accident) ([place])   . Depression   . DJD (degenerative joint disease) of hip   . DJD (degenerative joint disease) of knee    left knee  . GERD (gastroesophageal reflux disease)   . 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 H/O: substance abuse ([place])    hx of ETOH/Crack cocaine-none since 12/2012 per pt/Does not Drive due to this  . HLD (hyperlipidemia)   . HYPERTENSION 08/30/2008  . HYPERTHYROIDISM 02/13/2010   pt was told by  Dr Jenny Reichmann  that thyroid was now back to normal .. 2012 ...   . HYPOTENSION 10/31/2009  . 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 Other and unspecified hyperlipidemia 08/20/2013  . Seasonal allergies   . SPINAL STENOSIS, CERVICAL 08/08/2009  . Stroke ([place])   . THYROID NODULE, RIGHT 03/02/2010    Past Surgical History:  Procedure Laterality Date  . ANTERIOR CERVICAL DECOMP/DISCECTOMY FUSION  10/11/2011   Procedure: ANTERIOR CERVICAL DECOMPRESSION/DISCECTOMY FUSION 3 LEVELS;  Surgeon: Eustace Moore;  Location: [facility];  Service: Neurosurgery;  Laterality: N/A;  Cervical three-four ,cervical four five cervical five six Anterior Cervical Decompression Fusion with peek + plate Nuvasive  translational plate Orthofix peek (2 1/2 hours) Rm # [id number]  . ANTERIOR CERVICAL DECOMP/DISCECTOMY FUSION N/A 07/29/2014   Procedure: ANTERIOR CERVICAL DECOMPRESSION/DISCECTOMY FUSION 1 LEVEL/HARDWARE REMOVAL;  Surgeon: Eustace Moore, MD;  Location: [facility];  Service: Neurosurgery;  Laterality: N/A;  cervical six-seven  . BACK SURGERY  2019   Senan Urey  . COLONOSCOPY    . POSTERIOR CERVICAL FUSION/FORAMINOTOMY N/A 09/23/2013   Procedure: CERVICALTWO TO CERVICAL SEVEN POSTERIOR CERVICAL FUSION/FORAMINOTOMY WITH LATERAL MASS FIXATION;  Surgeon: Eustace Moore, MD;  Location: [facility];  Service: Neurosurgery;  Laterality: N/A;  . Right Hand I&D  12/07   s/p rdue to abscess-Dr. Amedeo Plenty  . TOTAL HIP ARTHROPLASTY  12/17/2011   Procedure: TOTAL HIP ARTHROPLASTY;  Surgeon: Johnny Bridge, MD;  Location: [facility];  Service: Orthopedics;  Laterality: Left;    Prior to Admission medications   Medication Sig Start Date End Date Taking? Authorizing Provider  adapalene (DIFFERIN) 0.1 % gel APPLY 1 APPLICATION TOPICALLY AT BEDTIME. 07/15/20  Yes Biagio Borg, MD  aspirin EC 81 MG tablet Take 81 mg by mouth daily. Swallow whole.   Yes [provider]  atorvastatin (LIPITOR) 40 MG tablet TAKE 1 TABLET BY MOUTH ONCE DAILY AT  6  PM Patient taking differently: Take 40 mg by mouth daily.  05/02/20  Yes Biagio Borg, MD  benzoyl peroxide (BENZOYL PEROXIDE) 5 % external liquid Apply 1 application topically 2 (two) times daily. 02/17/18  Yes Cathlean Cower  W, MD  bimatoprost (LUMIGAN) 0.01 % SOLN Place 1 drop into both eyes at bedtime.   Yes [provider]  citalopram (CELEXA) 20 MG tablet Take 1 tablet (20 mg total) by mouth daily. 05/02/20  Yes Biagio Borg, MD  clopidogrel (PLAVIX) 75 MG tablet Take 1 tablet (75 mg total) by mouth daily. 05/02/20  Yes Biagio Borg, MD  diphenhydrAMINE (BENADRYL) 25 mg capsule Take 50 mg by mouth every 6 (six) hours as needed for itching.    Yes [provider]  gabapentin (NEURONTIN) 100 MG capsule Take 200 mg by mouth 2 (two) times daily. 04/13/18  Yes [provider]  hydrochlorothiazide (MICROZIDE) 12.5 MG capsule TAKE 1 CAPSULE BY MOUTH EVERY DAY Patient taking differently: Take 12.5 mg by mouth daily.  06/13/20  Yes Biagio Borg, MD  HYDROcodone-acetaminophen [place]) 10-325 MG per tablet Take 1 tablet by mouth every 6 (six) hours as needed for moderate pain. 07/30/14  Yes Consuella Lose, MD  imiquimod (ALDARA) 5 % cream APPLY 3 TIMES A WEEK Patient taking differently: Apply 1 application topically 3 (three) times a week.  07/31/20  Yes Biagio Borg, MD  lidocaine (XYLOCAINE) 2 % solution TAKE AS DIRECTED Patient taking differently: Use as directed 15 mLs in the mouth or throat daily as needed (acid reflux).  07/31/20  Yes Biagio Borg, MD  nortriptyline (PAMELOR) 25 MG capsule Take 25 mg by mouth at bedtime. 10/29/17  Yes [provider]  oxybutynin (DITROPAN) 5 MG tablet Take 1 tablet (5 mg total) by mouth at bedtime. 11/05/19  Yes Biagio Borg, MD  pantoprazole (PROTONIX) 40 MG tablet Take 1 tablet (40 mg total) by mouth daily. 05/02/20  Yes Biagio Borg, MD  timolol (TIMOPTIC) 0.5 % ophthalmic solution Place 1 drop into both eyes daily. 10/28/17  Yes [provider]  tiZANidine (ZANAFLEX) 4 MG tablet Take 4 mg by mouth every 8 (eight) hours as needed for muscle spasms.  06/23/19  Yes [provider]  tretinoin (RETIN-A) 0.1 % cream APPLY  CREAM TOPICALLY TO AFFECTED AREA OF FACE AT BEDTIME Patient taking differently: Apply 1 application topically at bedtime.  11/05/19  Yes Biagio Borg, MD  zolpidem (AMBIEN) 10 MG tablet TAKE 1 TABLET BY MOUTH AT BEDTIME AS NEEDED FOR SLEEP Patient taking differently: Take 10 mg by mouth at bedtime as needed for sleep.  02/29/20  Yes Biagio Borg, MD  acetaminophen (TYLENOL) 500 MG tablet Take 500 mg by mouth every 6 (six) hours as needed for moderate pain or headache.      [provider]  famotidine (PEPCID) 40 MG tablet Take 1 tablet (40 mg total) by mouth 2 (two) times daily. Take every morning and at bedtime 12/23/18   Levin Erp, PA  fluticasone [facility]) 50 MCG/ACT nasal spray USE 2 SPRAYS DAILY IN BOTH NOSTRILS Patient taking differently: Place 2 sprays into both nostrils daily.  06/25/19   Biagio Borg, MD  ondansetron (ZOFRAN ODT) 4 MG disintegrating tablet Take 1 tablet (4 mg total) by mouth every 8 (eight) hours as needed for nausea or vomiting. 12/15/19   Biagio Borg, MD  sucralfate (CARAFATE) 1 g tablet Take 1 tablet (1 g total) by mouth 3 (three) times daily. Take 20 minutes after meals and at bedtime Patient not taking: Reported on 09/06/2020 12/23/18   Levin Erp, PA  tamsulosin (FLOMAX) 0.4 MG CAPS capsule Take 1 capsule by mouth twice daily  05/02/20   Biagio Borg, MD   Allergies  Allergen Reactions  . Tramadol Itching  . Morphine And Related Itching  . Oxycodone Itching    Social History   Tobacco Use  . Smoking status: Current Every Day Smoker    Packs/day: 0.50    Years: 40.00    Pack years: 20.00    Types: Cigarettes  . Smokeless tobacco: Never Used  Substance Use Topics  . Alcohol use: No    Alcohol/week: 0.0 standard drinks    Comment: hx of alcohol abuse quit 2014    Family History  Problem Relation Age of Onset  . Alcohol abuse Father   . Stroke Father   . Heart disease Father   . Arthritis Mother   . Hyperlipidemia Mother   . Goiter Mother        resection of benign goiter  . Hypertension Sister   . Diabetes Sister   . Goiter Sister        resection of benign goiter  . Diabetes Brother   . Alcohol abuse Brother   . Alcohol abuse Cousin   . Heart disease Other        Aunt  . Colon cancer Neg Hx   . Colon polyps Neg Hx   . Esophageal cancer Neg Hx   . Rectal cancer Neg Hx   . Stomach cancer Neg Hx      Review of Systems  Positive ROS: neg  All other systems have been  reviewed and were otherwise negative with the exception of those mentioned in the HPI and as above.  Objective: Vital signs in last 24 hours: Temp:  [98.5 F (36.9 C)] 98.5 F (36.9 C) (11/29 1126) Pulse Rate:  [79] 79 (11/29 1126) Resp:  [18] 18 (11/29 1126) BP: (156)/(93) 156/93 (11/29 1126) SpO2:  [100 %] 100 % (11/29 1126) Weight:  [76.2 kg] 76.2 kg (11/29 1126)  General Appearance: Alert, cooperative, no distress, appears stated age Head: Normocephalic, without obvious abnormality, atraumatic Eyes: PERRL, conjunctiva/corneas clear, EOM's intact    Neck: Supple, symmetrical, trachea midline Back: Symmetric, no curvature, ROM normal, no CVA tenderness Lungs:  respirations unlabored Heart: Regular rate and rhythm Abdomen: Soft, non-tender Extremities: Extremities normal, atraumatic, no cyanosis or edema Pulses: 2+ and symmetric all extremities Skin: Skin color, texture, turgor normal, no rashes or lesions  NEUROLOGIC:   Mental status: Alert and oriented x4,  no aphasia, good attention span, fund of knowledge, and memory Motor Exam - grossly normal Sensory Exam - grossly normal Reflexes: 1+ Coordination - grossly normal Gait - grossly normal Balance - grossly normal Cranial Nerves: I: smell Not tested  II: visual acuity  OS: nl    OD: nl  II: visual fields Full to confrontation  II: pupils Equal, round, reactive to light  III,VII: ptosis None  III,IV,VI: extraocular muscles  Full ROM  V: mastication Normal  V: facial light touch sensation  Normal  V,VII: corneal reflex  Present  VII: facial muscle function - upper  Normal  VII: facial muscle function - lower Normal  VIII: hearing Not tested  IX: soft palate elevation  Normal  IX,X: gag reflex Present  XI: trapezius strength  5/5  XI: sternocleidomastoid strength 5/5  XI: neck flexion strength  5/5  XII: tongue strength  Normal    Data Review Lab Results  Component Value Date   WBC 7.7 05/02/2020   HGB 13.4  05/02/2020   HCT 41.2 05/02/2020   MCV 93.6  05/02/2020   PLT 343 05/02/2020   Lab Results  Component Value Date   NA 141 05/02/2020   K 4.3 05/02/2020   CL 101 05/02/2020   CO2 29 05/02/2020   BUN 5 (L) 05/02/2020   CREATININE 1.05 05/02/2020   GLUCOSE 85 05/02/2020   Lab Results  Component Value Date   INR 0.94 10/26/2018    Assessment/Plan:  Estimated body mass index is 22.78 kg/m as calculated from the following:   Height as of this encounter: 6' (1.829 m).   Weight as of this encounter: 76.2 kg. Patient admitted for DLL L3-4, removal of hardware L4-5 with posterolateral fusion. Patient has failed a reasonable attempt at conservative therapy.  I explained the condition and procedure to the patient and answered any questions.  Patient wishes to proceed with procedure as planned. Understands risks/ benefits and typical outcomes of procedure.   Eustace Moore 09/11/2020 12:51 PM

## 2020-09-11 NOTE — Op Note (Signed)
09/11/2020  3:53 PM  PATIENT:  Mark Dunlap  59 y.o. 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 Decompressive lumbar hemilaminectomy, medial facetectomy foraminotomies L3-4 bilaterally with resection of synovial cyst 2. Posterior lateral  fusion L3-4 and L4-5 using morcellized allograft and local autograft 3.  Removal of loosened instrumentation L4-5 4.  Exploration of fusion L4-5 to confirm pseudoarthrosis  SURGEON:  Sherley Bounds, MD  ASSISTANTS: Glenford Peers, FNP  ANESTHESIA:  General  EBL: 250 ml  Total I/O In: 2100 [I.V.:1500; IV Piggyback:600] Out: 560 [Urine:310; Blood:250]  BLOOD ADMINISTERED:none  DRAINS: Medium Hemovac  INDICATION FOR PROCEDURE: This patient presented with back and leg pain. Imaging revealed pseudoarthrosis L4-5 with loosening of instrumentation with adjacent level stenosis L3-4 with a possible synovial cyst. The patient tried a reasonable attempt at conservative medical measures without relief. I recommended decompression at L3-4 with removal of loosened instrumentation at L4-5 with redo posterior lateral fusion L4-5.  To address the stenosis as well as the segmental  instability and pseudoarthrosis.  Patient understood the risks, benefits, and alternatives and potential outcomes and wished to proceed.  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  We felt that our decompression probably further destabilized the segment.  Given this we decided to carry our noninstrumented fusion up to the L3-4  level in the hopes that he would achieve arthrodesis at this level also without instrumentation.  PROCEDURE DETAILS:  The patient was brought to the operating room. After induction of generalized endotracheal anesthesia the patient was rolled into the prone position on chest rolls and all pressure points were padded. The patient's lumbar region was cleaned with Betadine scrub and then prepped with DuraPrep and draped in the usual sterile fashion. Anesthesia was injected and then a dorsal midline incision was made and carried down to the lumbosacral fascia. The fascia was opened and the paraspinous musculature was taken down in a subperiosteal fashion to expose L3-4 as well as the previously placed instrumentation at L4-5. A self-retaining retractor was placed.  We remove the soft tissue from the instrumentation and then remove the locking caps.  We were then able to remove the 2 rods.  The screws were so loosened and the bone that we were able to simply pull the L4 and L5 screws out except for the L4 screw on the right had to be backed out.  I then spent considerable time dissecting out over the facets of L4-5 and exposing the transverse processes bilaterally.  We explored the fusion at L4-5 and found no evidence of significant posterior lateral arthrodesis from the previous surgery.  We could pull on the spinous process and see movement at the segment.  I then turned my attention to the decompression and lumbar hemilaminectomies, medial facetectomies, and foraminotomies were performed at L3-4.  My nurse practitioner was directly involved in the decompression and exposure of the neural elements.  We saved the drill shavings from the laminectomy in a mucous trap for later arthrodesis. The yellow ligament was removed to expose the underlying large synovial cyst across the midline and was causing significant compression of  the dura and nerve roots.  We dissected between the cyst and the dura and remove the cyst in  pieces.  At no time did we see an unintended durotomy.  And generous foraminotomies were performed to adequately decompress the neural elements.Both nerve roots were decompressed on both sides until a coronary dilator passed easily along the nerve roots.  During the decompression we noticed significant motion of the joints at L3-4 especially on the right.  This, the finding of a synovial cyst and the fact that our decompression probably further destabilize the segment, let us do think that we should simply extend our fusion up to L3-4.  It is typical refused the level above her previous fusion that developed adjacent level stenosis.  Given the fact that the adjacent level not only had stenosis but a synovial cyst and the feeling of segmental instability we felt it was best to extend the fusion up to include L3-4.  We exposed the transverse processes of L3 and we then decorticated the transverse processes and laid a mixture of morcellized autograft and allograft out over these to perform intertransverse arthrodesis at L3-L5.  Irrigated with copious amounts of bacitracin-containing saline solution. Inspected the nerve roots once again to assure adequate decompression, lined to the dura with Gelfoam, placed bilateral medium Hemovac drains through a separate stab incision, and then we closed the muscle and the fascia with 0 Vicryl. Closed the subcutaneous tissues with 2-0 Vicryl and subcuticular tissues with 3-0 Vicryl. The skin was closed with benzoin and Steri-Strips. Dressing was then applied, the patient was awakened from general anesthesia and transported to the recovery room in stable condition. At the end of the procedure all sponge, needle and instrument counts were correct.   PLAN OF CARE: admit to inpatient  PATIENT DISPOSITION:  PACU - hemodynamically stable.   Delay start of Pharmacological VTE agent (>24hrs) due to surgical blood loss or risk of bleeding:  yes

## 2020-09-11 NOTE — Anesthesia Procedure Notes (Signed)
Procedure Name: Intubation Date/Time: 09/11/2020 1:21 PM Performed by: Reece Agar, CRNA Pre-anesthesia Checklist: Patient identified, Emergency Drugs available, Suction available and Patient being monitored Patient Re-evaluated:Patient Re-evaluated prior to induction Oxygen Delivery Method: Circle System Utilized Preoxygenation: Pre-oxygenation with 100% oxygen Induction Type: IV induction Ventilation: Mask ventilation without difficulty and Oral airway inserted - appropriate to patient size Laryngoscope Size: Mac and 4 Grade View: Grade I Tube type: Oral Tube size: 7.5 mm Number of attempts: 1 Airway Equipment and Method: Stylet and Oral airway Placement Confirmation: ETT inserted through vocal cords under direct vision,  positive ETCO2 and breath sounds checked- equal and bilateral Secured at: 22 cm Tube secured with: Tape Dental Injury: Teeth and Oropharynx as per pre-operative assessment

## 2020-09-11 NOTE — Anesthesia Postprocedure Evaluation (Signed)
Anesthesia Post Note  Patient: Mark Dunlap  Procedure(s) Performed: Laminectomy and Foraminotomy - Lumbar Three-Lumbar Four, posterior lateral fusion Lumbar Three-Four and Lumbar Four- Five (N/A Back) Removal of hardware LUmbar Four-Lumbar Five (N/A Back)     Patient location during evaluation: PACU Anesthesia Type: General Level of consciousness: awake and alert Pain management: pain level controlled Vital Signs Assessment: post-procedure vital signs reviewed and stable Respiratory status: spontaneous breathing, nonlabored ventilation, respiratory function stable and patient connected to nasal cannula oxygen Cardiovascular status: blood pressure returned to baseline and stable Postop Assessment: no apparent nausea or vomiting Anesthetic complications: no   No complications documented.  Last Vitals:  Vitals:   09/11/20 1655 09/11/20 1721  BP: 126/88 (!) 157/103  Pulse: 75 71  Resp: 13 18  Temp: 36.6 C   SpO2: 93% 95%    Last Pain:  Vitals:   09/11/20 1630  TempSrc:   PainSc: 10-Worst pain ever                 Zaelyn Barbary COKER

## 2020-09-11 NOTE — Transfer of Care (Signed)
Immediate Anesthesia Transfer of Care Note  Patient: Mark Dunlap  Procedure(s) Performed: Laminectomy and Foraminotomy - Lumbar Three-Lumbar Four, posterior lateral fusion Lumbar Three-Four and Lumbar Four- Five (N/A Back) Removal of hardware LUmbar Four-Lumbar Five (N/A Back)  Patient Location: PACU  Anesthesia Type:General  Level of Consciousness: awake, alert  and oriented  Airway & Oxygen Therapy: Patient Spontanous Breathing and Patient connected to face mask oxygen  Post-op Assessment: Report given to RN and Post -op Vital signs reviewed and stable  Post vital signs: Reviewed and stable  Last Vitals:  Vitals Value Taken Time  BP 132/85 09/11/20 1601  Temp    Pulse 90 09/11/20 1603  Resp 15 09/11/20 1603  SpO2 99 % 09/11/20 1603  Vitals shown include unvalidated device data.  Last Pain:  Vitals:   09/11/20 1225  TempSrc:   PainSc: 0-No pain      Patients Stated Pain Goal: 4 (22/02/54 2706)  Complications: No complications documented.

## 2020-09-12 ENCOUNTER — Encounter (HOSPITAL_COMMUNITY): Payer: Self-pay | Admitting: Neurological Surgery

## 2020-09-12 MED ORDER — ALUM & MAG HYDROXIDE-SIMETH 200-200-20 MG/5ML PO SUSP
30.0000 mL | Freq: Four times a day (QID) | ORAL | Status: DC | PRN
  Administered 2020-09-12: 30 mL via ORAL
  Filled 2020-09-12: qty 30

## 2020-09-12 MED ORDER — HYDROCODONE-ACETAMINOPHEN 10-325 MG PO TABS: 1.0000 | ORAL_TABLET | Freq: Four times a day (QID) | ORAL | 0 refills | Status: DC | PRN

## 2020-09-12 NOTE — Discharge Summary (Signed)
Physician Discharge Summary  Patient ID: Mark Dunlap MRN: [id number] DOB/AGE: September 24, 1961 59 y.o.  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 The patient tolerated the procedure well and was taken to the recovery room and then to the floor in stable condition. The hospital course was routine. There were no complications. The wound remained clean dry and intact. Pt had appropriate back soreness. No complaints of leg pain or new N/T/W. The patient remained afebrile with stable vital signs, and tolerated a regular diet. The patient continued to increase activities, and pain was well controlled with oral pain medications.   Consults: None  Significant Diagnostic Studies:  Results for orders placed or performed during the hospital encounter of 19/50/93  Basic metabolic panel  Result Value Ref Range   Sodium 139 135 - 145 mmol/L   Potassium 4.1 3.5 - 5.1 mmol/L   Chloride 106 98 - 111 mmol/L   CO2 27 22 - 32 mmol/L   Glucose, Bld 88 70 - 99 mg/dL   BUN 6 6 - 20 mg/dL   Creatinine, Ser 0.93 0.61 - 1.24 mg/dL   Calcium 8.9 8.9 - 10.3 mg/dL   GFR, Estimated >60 >60 mL/min   Anion gap 6 5 - 15  CBC WITH DIFFERENTIAL  Result Value Ref Range   WBC 6.7 4.0 - 10.5 K/uL   RBC 4.00 (L) 4.22 - 5.81 MIL/uL   Hemoglobin 11.9 (L) 13.0 - 17.0 g/dL   HCT 38.5 (L) 39 - 52 %   MCV 96.3 80.0 - 100.0 fL   MCH 29.8 26.0 - 34.0 pg   MCHC 30.9 30.0 - 36.0 g/dL   RDW 13.8 11.5 - 15.5 %   Platelets 315 150 - 400 K/uL   nRBC 0.0 0.0 - 0.2 %   Neutrophils Relative % 60 %   Neutro Abs 4.0 1.7 - 7.7 K/uL   Lymphocytes Relative 23 %   Lymphs Abs 1.5 0.7 - 4.0  K/uL   Monocytes Relative 12 %   Monocytes Absolute 0.8 0.1 - 1.0 K/uL   Eosinophils Relative 4 %   Eosinophils Absolute 0.3 0.0 - 0.5 K/uL   Basophils Relative 1 %   Basophils Absolute 0.1 0.0 - 0.1 K/uL   Immature Granulocytes 0 %   Abs Immature Granulocytes 0.02 0.00 - 0.07 K/uL  Protime-INR  Result Value Ref Range   Prothrombin Time 12.9 11.4 - 15.2 seconds   INR 1.0 0.8 - 1.2  Type and screen [place]  Result Value Ref Range   ABO/RH(D) B POS    Antibody Screen NEG    Sample Expiration      09/14/2020,2359 Performed at [facility], [address], [place], [place] [address]     No results found.  Antibiotics:  Anti-infectives (From admission, onward)   Start     Dose/Rate Route Frequency Ordered Stop   09/11/20 2200  ceFAZolin (ANCEF) IVPB 2g/100 mL premix        2 g 200 mL/hr over 30 Minutes Intravenous Every 8 hours 09/11/20 1630 09/12/20 1359   09/11/20 1200  ceFAZolin (ANCEF) IVPB 2g/100 mL premix        2 g 200 mL/hr over  30 Minutes Intravenous On call to O.R. 09/11/20 1150 09/11/20 1344      Discharge Exam: Blood pressure 129/85, pulse (!) 59, temperature 97.8 F (36.6 C), temperature source Oral, resp. rate 18, height 6' (1.829 m), weight 76.2 kg, SpO2 100 %. Neurologic: Grossly normal Dressing dry  Discharge Medications:   Allergies as of 09/12/2020      Reactions   Tramadol Itching   Morphine And Related Itching   Oxycodone Itching      Medication List    TAKE these medications   acetaminophen 500 MG tablet Commonly known as: TYLENOL Take 500 mg by mouth every 6 (six) hours as needed for moderate pain or headache.   adapalene 0.1 % gel Commonly known as: DIFFERIN APPLY 1 APPLICATION TOPICALLY AT BEDTIME.   aspirin EC 81 MG tablet Take 81 mg by mouth daily. Swallow whole.   atorvastatin 40 MG tablet Commonly known as: LIPITOR TAKE 1 TABLET BY MOUTH ONCE DAILY AT  6  PM What changed:   how much to take  how  to take this  when to take this  additional instructions   benzoyl peroxide 5 % external liquid Commonly known as: benzoyl peroxide Apply 1 application topically 2 (two) times daily.   bimatoprost 0.01 % Soln Commonly known as: LUMIGAN Place 1 drop into both eyes at bedtime.   citalopram 20 MG tablet Commonly known as: CELEXA Take 1 tablet (20 mg total) by mouth daily.   clopidogrel 75 MG tablet Commonly known as: PLAVIX Take 1 tablet (75 mg total) by mouth daily.   diphenhydrAMINE 25 mg capsule Commonly known as: BENADRYL Take 50 mg by mouth every 6 (six) hours as needed for itching.   famotidine 40 MG tablet Commonly known as: Pepcid Take 1 tablet (40 mg total) by mouth 2 (two) times daily. Take every morning and at bedtime   fluticasone 50 MCG/ACT nasal spray Commonly known as: FLONASE USE 2 SPRAYS DAILY IN BOTH NOSTRILS What changed: See the new instructions.   gabapentin 100 MG capsule Commonly known as: NEURONTIN Take 200 mg by mouth 2 (two) times daily.   hydrochlorothiazide 12.5 MG capsule Commonly known as: MICROZIDE TAKE 1 CAPSULE BY MOUTH EVERY DAY What changed: how much to take   HYDROcodone-acetaminophen 10-325 MG tablet Commonly known as: NORCO Take 1 tablet by mouth every 6 (six) hours as needed for moderate pain.   imiquimod 5 % cream Commonly known as: ALDARA APPLY 3 TIMES A WEEK What changed: See the new instructions.   lidocaine 2 % solution Commonly known as: XYLOCAINE TAKE AS DIRECTED What changed:   how much to take  how to take this  when to take this  reasons to take this   nortriptyline 25 MG capsule Commonly known as: PAMELOR Take 25 mg by mouth at bedtime.   ondansetron 4 MG disintegrating tablet Commonly known as: Zofran ODT Take 1 tablet (4 mg total) by mouth every 8 (eight) hours as needed for nausea or vomiting.   oxybutynin 5 MG tablet Commonly known as: DITROPAN Take 1 tablet (5 mg total) by mouth at  bedtime.   pantoprazole 40 MG tablet Commonly known as: PROTONIX Take 1 tablet (40 mg total) by mouth daily.   sucralfate 1 g tablet Commonly known as: Carafate Take 1 tablet (1 g total) by mouth 3 (three) times daily. Take 20 minutes after meals and at bedtime   tamsulosin 0.4 MG Caps capsule Commonly known as: FLOMAX Take 1 capsule by  mouth twice daily   timolol 0.5 % ophthalmic solution Commonly known as: TIMOPTIC Place 1 drop into both eyes daily.   tiZANidine 4 MG tablet Commonly known as: ZANAFLEX Take 4 mg by mouth every 8 (eight) hours as needed for muscle spasms.   tretinoin 0.1 % cream Commonly known as: RETIN-A APPLY  CREAM TOPICALLY TO AFFECTED AREA OF FACE AT BEDTIME What changed:   how much to take  how to take this  when to take this  additional instructions   zolpidem 10 MG tablet Commonly known as: AMBIEN TAKE 1 TABLET BY MOUTH AT BEDTIME AS NEEDED FOR SLEEP What changed:   reasons to take this  additional instructions       Disposition: home   Final Dx: s/p LL L3-4, removal of instrumentation and fusion L3-5  Discharge Instructions     Remove dressing in 72 hours   Complete by: As directed    Call MD for:  difficulty breathing, headache or visual disturbances   Complete by: As directed    Call MD for:  persistant nausea and vomiting   Complete by: As directed    Call MD for:  redness, tenderness, or signs of infection (pain, swelling, redness, odor or green/yellow discharge around incision site)   Complete by: As directed    Call MD for:  severe uncontrolled pain   Complete by: As directed    Call MD for:  temperature >100.4   Complete by: As directed    Diet - low sodium heart healthy   Complete by: As directed    Increase activity slowly   Complete by: As directed          Signed: Eustace Moore 09/12/2020, 7:35 AM

## 2020-09-12 NOTE — Evaluation (Signed)
Occupational Therapy Evaluation Patient Details Name: Mark Dunlap MRN: [id number] DOB: December 30, 1960 Today's Date: 09/12/2020    History of Present Illness Pt s/p decompression L3-4, fusion L3-5, removal of loosened hardware L4-5. PMH - legally blind, ACDF, back fusion, HTN, CVA, DJD, THR   Clinical Impression   Pt admitted with the above diagnoses. At baseline, pt is independent to mod I with ADLs. Completed ADL education as related to back precautions. All OT education completed. No further OT needs identified. Pt awaiting d/c home shortly. Will sign off.    Follow Up Recommendations  No OT follow up    Equipment Recommendations  None recommended by OT    Recommendations for Other Services       Precautions / Restrictions Precautions Precautions: Back Precaution Booklet Issued: Yes (comment) Required Braces or Orthoses: Spinal Brace Spinal Brace: Applied in sitting position;Applied in standing position;Lumbar corset Restrictions Weight Bearing Restrictions: No      Mobility Bed Mobility Overal bed mobility: Modified Independent             General bed mobility comments: Incr time and effort    Transfers Overall transfer level: Modified independent Equipment used: Rolling walker (2 wheeled)             General transfer comment: Incr time and effort. cues for technique with rw.    Balance Overall balance assessment: Mild deficits observed, not formally tested                                         ADL either performed or assessed with clinical judgement   ADL Overall ADL's : Needs assistance/impaired Eating/Feeding: Set up;Sitting   Grooming: Supervision/safety;Standing;Sitting;Set up   Upper Body Bathing: Set up;Sitting   Lower Body Bathing: Modified independent;Supervison/ safety;Sit to/from stand;With adaptive equipment   Upper Body Dressing : Set up;Sitting   Lower Body Dressing: Supervision/safety;Modified  independent;Sit to/from stand;With adaptive equipment   Toilet Transfer: Supervision/safety;Ambulation;RW;Comfort height toilet;Grab bars   Toileting- Clothing Manipulation and Hygiene: Supervision/safety;Modified independent;Sit to/from stand;Sitting/lateral lean Toileting - Clothing Manipulation Details (indicate cue type and reason): discussed AE for pericare if needed.  Tub/ Shower Transfer: Walk-in shower;Supervision/safety;Ambulation;Shower seat;Rolling walker   Functional mobility during ADLs: Supervision/safety;Rolling walker General ADL Comments: Reviewed strategies and AE/DME for ADLs to maintain back precautions.      Vision Baseline Vision/History: Legally blind;Wears glasses (L eye legally blind) Wears Glasses: At all times       Perception     Praxis      Pertinent Vitals/Pain Pain Assessment: 0-10 Pain Score: 9  Faces Pain Scale: Hurts even more Pain Location: back Pain Descriptors / Indicators: Grimacing;Operative site guarding Pain Intervention(s): Limited activity within patient's tolerance;Monitored during session;Repositioned;Patient requesting pain meds-RN notified     Hand Dominance Right   Extremity/Trunk Assessment Upper Extremity Assessment Upper Extremity Assessment: Overall WFL for tasks assessed   Lower Extremity Assessment Lower Extremity Assessment: Defer to PT evaluation   Cervical / Trunk Assessment Cervical / Trunk Assessment: Other exceptions (s/p lumbar fusion)   Communication Communication Communication: No difficulties   Cognition Arousal/Alertness: Awake/alert Behavior During Therapy: Restless Overall Cognitive Status: Within Functional Limits for tasks assessed                                 General Comments: Distractable - likely baseline  General Comments       Exercises     Shoulder Instructions      Home Living Family/patient expects to be discharged to:: Private residence Living Arrangements:  Parent Available Help at Discharge: Family;Available 24 hours/day Type of Home: House Home Access: Level entry     Home Layout: One level     Bathroom Shower/Tub: Occupational psychologist: Standard     Home Equipment: Bedside commode;Shower seat;Adaptive equipment Adaptive Equipment: Long-handled shoe horn;Reacher;Long-handled sponge (gave reacher during session)        Prior Functioning/Environment Level of Independence: Independent        Comments: works at [employer]. Uses blind cane. Helps care for his mother        OT Problem List: Impaired balance (sitting and/or standing);Decreased knowledge of use of DME or AE;Decreased knowledge of precautions;Pain      OT Treatment/Interventions:      OT Goals(Current goals can be found in the care plan section) Acute Rehab OT Goals Patient Stated Goal: home this morning  OT Frequency:     Barriers to D/C:            Co-evaluation              AM-PAC OT "6 Clicks" Daily Activity     Outcome Measure Help from another person eating meals?: None Help from another person taking care of personal grooming?: None Help from another person toileting, which includes using toliet, bedpan, or urinal?: None Help from another person bathing (including washing, rinsing, drying)?: None Help from another person to put on and taking off regular upper body clothing?: None Help from another person to put on and taking off regular lower body clothing?: None 6 Click Score: 24   End of Session Equipment Utilized During Treatment: Rolling walker;Back brace Nurse Communication: Patient requests pain meds  Activity Tolerance: Patient tolerated treatment well;Patient limited by pain Patient left: with call bell/phone within reach  OT Visit Diagnosis: Pain;Other abnormalities of gait and mobility (R26.89)                Time: 7793-9030 OT Time Calculation (min): 15 min Charges:  OT General Charges $OT Visit:  1 Visit OT Evaluation $OT Eval Low Complexity: [place], OT Acute Rehabilitation Services Pager: [phone] Office: [phone]   Hortencia Pilar 09/12/2020, 9:51 AM

## 2020-09-12 NOTE — Evaluation (Signed)
Physical Therapy Evaluation Patient Details Name: Mark Dunlap MRN: [id number] DOB: [birth year] Today's Date: 09/12/2020   History of Present Illness  Pt s/p decompression L3-4, fusion L3-5, removal of loosened hardware L4-5. PMH - legally blind, ACDF, back fusion, HTN, CVA, DJD, THR  Clinical Impression  Pt doing well with mobility and no further PT needed.  Ready for dc from PT standpoint.      Follow Up Recommendations No PT follow up    Equipment Recommendations  Rolling walker with 5" wheels    Recommendations for Other Services       Precautions / Restrictions Precautions Precautions: Back Precaution Booklet Issued: Yes (comment) Required Braces or Orthoses: Spinal Brace Spinal Brace: Applied in sitting position;Applied in standing position;Lumbar corset      Mobility  Bed Mobility Overal bed mobility: Modified Independent             General bed mobility comments: Incr time and effort    Transfers Overall transfer level: Modified independent Equipment used: None;Rolling walker (2 wheeled)             General transfer comment: Incr time and effort  Ambulation/Gait Ambulation/Gait assistance: Modified independent (Device/Increase time) Gait Distance (Feet): 300 Feet Assistive device: Rolling walker (2 wheeled) Gait Pattern/deviations: Step-through pattern;Decreased step length - right;Decreased step length - left;Decreased stride length (slight knee flex) Gait velocity: decr Gait velocity interpretation: <1.31 ft/sec, indicative of household ambulator General Gait Details: Steady gait with walker  Stairs            Wheelchair Mobility    Modified Rankin (Stroke Patients Only)       Balance Overall balance assessment: Mild deficits observed, not formally tested                                           Pertinent Vitals/Pain Pain Assessment: Faces Faces Pain Scale: Hurts even more Pain Location: back Pain  Descriptors / Indicators: Grimacing;Operative site guarding Pain Intervention(s): Monitored during session;Repositioned    Home Living Family/patient expects to be discharged to:: Private residence Living Arrangements: Parent Available Help at Discharge: Family;Available 24 hours/day Type of Home: House Home Access: Level entry     Home Layout: One level Home Equipment: Bedside commode      Prior Function Level of Independence: Independent         Comments: works at [employer]. Uses blind cane     Hand Dominance   Dominant Hand: Right    Extremity/Trunk Assessment   Upper Extremity Assessment Upper Extremity Assessment: Defer to OT evaluation    Lower Extremity Assessment Lower Extremity Assessment: Generalized weakness       Communication   Communication: No difficulties  Cognition Arousal/Alertness: Awake/alert Behavior During Therapy: Restless Overall Cognitive Status: Within Functional Limits for tasks assessed                                 General Comments: Distractable - likely baseline      General Comments      Exercises     Assessment/Plan    PT Assessment Patent does not need any further PT services  PT Problem List         PT Treatment Interventions      PT Goals (Current goals can be found in the Care Plan  section)  Acute Rehab PT Goals PT Goal Formulation: All assessment and education complete, DC therapy    Frequency     Barriers to discharge        Co-evaluation               AM-PAC PT "6 Clicks" Mobility  Outcome Measure Help needed turning from your back to your side while in a flat bed without using bedrails?: None Help needed moving from lying on your back to sitting on the side of a flat bed without using bedrails?: None Help needed moving to and from a bed to a chair (including a wheelchair)?: None Help needed standing up from a chair using your arms (e.g., wheelchair or bedside  chair)?: None Help needed to walk in hospital room?: None Help needed climbing 3-5 steps with a railing? : A Little 6 Click Score: 23    End of Session Equipment Utilized During Treatment: Back brace Activity Tolerance: Patient tolerated treatment well Patient left: in bed;with call bell/phone within reach Nurse Communication: Mobility status PT Visit Diagnosis: Muscle weakness (generalized) (M62.81);Other abnormalities of gait and mobility (R26.89)    Time: 3825-0539 PT Time Calculation (min) (ACUTE ONLY): 21 min   Charges:   PT Evaluation $PT Eval Moderate Complexity: [place] Pager [phone] Office [place] 09/12/2020, 9:25 AM

## 2020-09-12 NOTE — Progress Notes (Signed)
Orthopedic Tech Progress Note Patient Details:  Mark Dunlap 1960/12/26 [id number] Called in order to [facility] for a LSO BRACE Patient ID: Mark Dunlap, male   DOB: December 29, 1960, 59 y.o.   MRN: [id number]   Mark Dunlap 09/12/2020, 8:45 AM

## 2020-09-12 NOTE — Plan of Care (Signed)
Patient alert and oriented, mae's well, voiding adequate amount of urine, swallowing without difficulty, no c/o pain at time of discharge. Patient discharged home with family. Script and discharged instructions given to patient. Patient and family stated understanding of instructions given. Patient has an appointment with Dr. Jones °

## 2020-09-24 ENCOUNTER — Other Ambulatory Visit: Payer: Self-pay

## 2020-09-24 ENCOUNTER — Ambulatory Visit
Admission: RE | Admit: 2020-09-24 | Discharge: 2020-09-24 | Disposition: A | Discharge status: Home / Self Care | Payer: Medicare Other | Source: Ambulatory Visit | Attending: Urology | Admitting: Urology

## 2020-09-24 DIAGNOSIS — R972 Elevated prostate specific antigen [PSA]: Secondary | ICD-10-CM

## 2020-10-04 ENCOUNTER — Other Ambulatory Visit (HOSPITAL_COMMUNITY): Payer: Self-pay | Admitting: Urology

## 2020-10-04 DIAGNOSIS — R972 Elevated prostate specific antigen [PSA]: Secondary | ICD-10-CM

## 2020-10-18 ENCOUNTER — Other Ambulatory Visit: Payer: Self-pay

## 2020-10-18 ENCOUNTER — Ambulatory Visit (HOSPITAL_COMMUNITY)
Admission: RE | Admit: 2020-10-18 | Discharge: 2020-10-18 | Disposition: A | Discharge status: Home / Self Care | Payer: Medicare Other | Source: Ambulatory Visit | Attending: Urology | Admitting: Urology

## 2020-10-18 DIAGNOSIS — R972 Elevated prostate specific antigen [PSA]: Secondary | ICD-10-CM | POA: Diagnosis present

## 2020-10-18 MED ORDER — LIDOCAINE HCL URETHRAL/MUCOSAL 2 % EX GEL
CUTANEOUS | Status: AC
  Filled 2020-10-18: qty 30

## 2020-10-18 MED ORDER — GADOBUTROL 1 MMOL/ML IV SOLN
8.0000 mL | Freq: Once | INTRAVENOUS | Status: AC | PRN
  Administered 2020-10-18: 8 mL via INTRAVENOUS

## 2020-10-20 ENCOUNTER — Ambulatory Visit: Payer: Medicare Other | Admitting: Podiatry

## 2020-10-20 ENCOUNTER — Other Ambulatory Visit: Payer: Self-pay

## 2020-10-20 DIAGNOSIS — M19072 Primary osteoarthritis, left ankle and foot: Secondary | ICD-10-CM

## 2020-10-20 DIAGNOSIS — M19071 Primary osteoarthritis, right ankle and foot: Secondary | ICD-10-CM | POA: Diagnosis not present

## 2020-10-23 ENCOUNTER — Telehealth: Payer: Self-pay

## 2020-10-23 NOTE — Telephone Encounter (Signed)
Ok looks good ?

## 2020-10-23 NOTE — Telephone Encounter (Signed)
Received faxed request from [facility] Dr Abner Greenspan requesting surgical clearance for MRI fusion biopsy on 11/21/20.   Pt confirmed appt for 11/21/20; appt for surgical clearance made for 11/01/20 at 4p.  Form will be on CMA desk.

## 2020-10-24 ENCOUNTER — Encounter: Payer: Self-pay | Admitting: Podiatry

## 2020-10-24 NOTE — Progress Notes (Signed)
Subjective:  Patient ID: Mark Dunlap, male    DOB: 1960/11/12,  MRN: [id number]  Chief Complaint  Patient presents with  . Foot Pain    PT stated that he is doing well and the injections are helping with the pain    60 y.o. male presents with the above complaint.  Patient is here for follow-up of bilateral first metatarsophalangeal joint pain with severe arthrosis.  Patient states that he cannot do surgery at this time as he has to take care of his mother.  However he will consider surgery in the near future when he has his home situation in order.  At this time patient states the injections have been helping.  He would like to continue doing injections for now.  I will continue doing so as long as his improvement every 3 months or so.  Patient states understanding   Review of Systems: Negative except as noted in the HPI. Denies N/V/F/Ch.  Past Medical History:  Diagnosis Date  . Acute prostatitis 05/23/2009  . Adjustment disorder with depressed mood 09/2007  . Cataract    bilateral - MD just watching  . CERVICAL RADICULOPATHY, LEFT 08/30/2008  . CVA (cerebrovascular accident) ([place])   . Depression   . DJD (degenerative joint disease) of hip   . DJD (degenerative joint disease) of knee    left knee  . GERD (gastroesophageal reflux disease)   . 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 H/O: substance abuse ([place])    hx of ETOH/Crack cocaine-none since 12/2012 per pt/Does not Drive due to this  . HLD (hyperlipidemia)   . HYPERTENSION 08/30/2008  . HYPERTHYROIDISM 02/13/2010   pt was told by  Dr Jenny Reichmann  that thyroid was now back to normal .. 2012 ...   . HYPOTENSION 10/31/2009  . 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 Other and unspecified hyperlipidemia 08/20/2013  . Seasonal allergies   . SPINAL STENOSIS, CERVICAL 08/08/2009  . Stroke ([place])   . THYROID NODULE, RIGHT 03/02/2010     Current Outpatient Medications:  .  acetaminophen (TYLENOL) 500 MG tablet, Take 500 mg by mouth every 6 (six) hours as needed for moderate pain or headache. , Disp: , Rfl:  .  adapalene (DIFFERIN) 0.1 % gel, APPLY 1 APPLICATION TOPICALLY AT BEDTIME., Disp: 45 g, Rfl: 1 .  aspirin EC 81 MG tablet, Take 81 mg by mouth daily. Swallow whole., Disp: , Rfl:  .  atorvastatin (LIPITOR) 40 MG tablet, TAKE 1 TABLET BY MOUTH ONCE DAILY AT  6  PM (Patient taking differently: Take 40 mg by mouth daily. ), Disp: 90 tablet, Rfl: 3 .  benzoyl peroxide (BENZOYL PEROXIDE) 5 % external liquid, Apply 1 application topically 2 (two) times daily., Disp: 226 g, Rfl: 0 .  bimatoprost (LUMIGAN) 0.01 % SOLN, Place 1 drop into both eyes at bedtime., Disp: , Rfl:  .  citalopram (CELEXA) 20 MG tablet, Take 1 tablet (20 mg total) by mouth daily., Disp: 90 tablet, Rfl: 3 .  clopidogrel (PLAVIX) 75 MG tablet, Take 1 tablet (75 mg total) by mouth daily., Disp: 90 tablet, Rfl: 3 .  diphenhydrAMINE (BENADRYL) 25 mg capsule, Take 50 mg by mouth every 6 (six) hours as needed for itching. , Disp: , Rfl:  .  famotidine (PEPCID) 40 MG tablet, Take 1 tablet (40 mg total) by mouth 2 (  two) times daily. Take every morning and at bedtime, Disp: 60 tablet, Rfl: 3 .  fluticasone (FLONASE) 50 MCG/ACT nasal spray, USE 2 SPRAYS DAILY IN BOTH NOSTRILS (Patient taking differently: Place 2 sprays into both nostrils daily. ), Disp: 32 mL, Rfl: 0 .  gabapentin (NEURONTIN) 100 MG capsule, Take 200 mg by mouth 2 (two) times daily., Disp: , Rfl: 2 .  hydrochlorothiazide (MICROZIDE) 12.5 MG capsule, TAKE 1 CAPSULE BY MOUTH EVERY DAY (Patient taking differently: Take 12.5 mg by mouth daily. ), Disp: 90 capsule, Rfl: 1 .  HYDROcodone-acetaminophen (NORCO) 10-325 MG tablet, Take 1 tablet by mouth every 6 (six) hours as needed for moderate pain., Disp: 60 tablet, Rfl: 0 .  imiquimod (ALDARA) 5 % cream, APPLY 3 TIMES A WEEK (Patient taking differently: Apply  1 application topically 3 (three) times a week. ), Disp: 24 each, Rfl: 0 .  lidocaine (XYLOCAINE) 2 % solution, TAKE AS DIRECTED (Patient taking differently: Use as directed 15 mLs in the mouth or throat daily as needed (acid reflux). ), Disp: 450 mL, Rfl: 0 .  nortriptyline (PAMELOR) 25 MG capsule, Take 25 mg by mouth at bedtime., Disp: , Rfl: 0 .  ondansetron (ZOFRAN ODT) 4 MG disintegrating tablet, Take 1 tablet (4 mg total) by mouth every 8 (eight) hours as needed for nausea or vomiting., Disp: 20 tablet, Rfl: 1 .  oxybutynin (DITROPAN) 5 MG tablet, Take 1 tablet (5 mg total) by mouth at bedtime., Disp: 90 tablet, Rfl: 3 .  pantoprazole (PROTONIX) 40 MG tablet, Take 1 tablet (40 mg total) by mouth daily., Disp: 90 tablet, Rfl: 3 .  sucralfate (CARAFATE) 1 g tablet, Take 1 tablet (1 g total) by mouth 3 (three) times daily. Take 20 minutes after meals and at bedtime (Patient not taking: Reported on 09/06/2020), Disp: 120 tablet, Rfl: 1 .  tamsulosin (FLOMAX) 0.4 MG CAPS capsule, Take 1 capsule by mouth twice daily, Disp: 180 capsule, Rfl: 3 .  timolol (TIMOPTIC) 0.5 % ophthalmic solution, Place 1 drop into both eyes daily., Disp: , Rfl: 6 .  tiZANidine (ZANAFLEX) 4 MG tablet, Take 4 mg by mouth every 8 (eight) hours as needed for muscle spasms. , Disp: , Rfl:  .  tretinoin (RETIN-A) 0.1 % cream, APPLY  CREAM TOPICALLY TO AFFECTED AREA OF FACE AT BEDTIME (Patient taking differently: Apply 1 application topically at bedtime. ), Disp: 40 g, Rfl: 1 .  zolpidem (AMBIEN) 10 MG tablet, TAKE 1 TABLET BY MOUTH AT BEDTIME AS NEEDED FOR SLEEP (Patient taking differently: Take 10 mg by mouth at bedtime as needed for sleep. ), Disp: 90 tablet, Rfl: 1  Social History   Tobacco Use  Smoking Status Current Every Day Smoker  . Packs/day: 0.50  . Years: 40.00  . Pack years: 20.00  . Types: Cigarettes  Smokeless Tobacco Never Used    Allergies  Allergen Reactions  . Tramadol Itching  . Morphine And Related  Itching  . Oxycodone Itching   Objective:   There were no vitals filed for this visit. There is no height or weight on file to calculate BMI. Constitutional Well developed. Well nourished.  Vascular Dorsalis pedis pulses palpable bilaterally. Posterior tibial pulses palpable bilaterally. Capillary refill normal to all digits.  No cyanosis or clubbing noted. Pedal hair growth normal.  Neurologic Normal speech. Oriented to person, place, and time. Epicritic sensation to light touch grossly present bilaterally.  Dermatologic Nails well groomed and normal in appearance. No open wounds. No skin lesions.  Orthopedic:  Limited range of motion noted of bilateral first metatarsophalangeal joint with left more painful than right.  Intra-articular pain noted bilaterally.  Pain with end range of motion.  Hallux limitus noted bilaterally.   Radiographs: 2 views of bilaterally skeletally mature adult foot: Severe arthritic changes noted to the right side.  Uneven joint space narrowing noted to the left side.  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  No other bony abnormalities identified mild arthritic changes noted to the dorsal aspect of the midfoot.  Mild elevatus present bilaterally. Assessment:   1. Arthritis of first metatarsophalangeal (MTP) joint of left foot   2. Arthritis of first metatarsophalangeal (MTP) joint of right foot    Plan:  Patient was evaluated and treated and all questions answered.  Bilateral first metatarsophalangeal joint arthritis with left more painful than right -Given clinically patient is improving and the pain is decreasing with steroid injection I believe he will benefit from another steroid injection to bilateral first metatarsophalangeal joint to help him get to closer to 95% or greater in terms of pain relief.  Patient states understanding would like to proceed with a injection to  bilateral first metatarsophalangeal joint.  However at this time I am worried that his pain is not getting resolved and he is needing more injection.  I briefly discussed with him that he will need surgical intervention if this injection does not help.  For now patient states injections do help and once he gets his social situation under control he will proceed with surgery.  Patient states understanding -A steroid injection was performed at bilateral first metatarsophalangeal joint using 1% plain Lidocaine and 10 mg of Kenalog. This was well tolerated. -I will also discussed with him orthotics management with Morton's extension take some of the stress off of the first metatarsophalangeal joint. -I will continue doing bilateral steroid injection as long as it is controlling his pain.  I will continue to do steroid injection until patient is ready for surgery about every 12 weeks No follow-ups on file.

## 2020-10-31 ENCOUNTER — Ambulatory Visit: Payer: Medicare Other | Admitting: Internal Medicine

## 2020-10-31 ENCOUNTER — Encounter: Payer: Self-pay | Admitting: Internal Medicine

## 2020-10-31 ENCOUNTER — Other Ambulatory Visit: Payer: Self-pay

## 2020-10-31 ENCOUNTER — Other Ambulatory Visit: Payer: Self-pay | Admitting: Internal Medicine

## 2020-10-31 VITALS — BP 146/90 | HR 84 | Temp 98.6°F | Ht 72.0 in | Wt 170.0 lb

## 2020-10-31 DIAGNOSIS — E538 Deficiency of other specified B group vitamins: Secondary | ICD-10-CM | POA: Diagnosis not present

## 2020-10-31 DIAGNOSIS — E7849 Other hyperlipidemia: Secondary | ICD-10-CM | POA: Diagnosis not present

## 2020-10-31 DIAGNOSIS — Z01818 Encounter for other preprocedural examination: Secondary | ICD-10-CM | POA: Insufficient documentation

## 2020-10-31 DIAGNOSIS — E559 Vitamin D deficiency, unspecified: Secondary | ICD-10-CM

## 2020-10-31 DIAGNOSIS — Z0001 Encounter for general adult medical examination with abnormal findings: Secondary | ICD-10-CM | POA: Diagnosis not present

## 2020-10-31 DIAGNOSIS — I10 Essential (primary) hypertension: Secondary | ICD-10-CM | POA: Diagnosis not present

## 2020-10-31 DIAGNOSIS — F4321 Adjustment disorder with depressed mood: Secondary | ICD-10-CM

## 2020-10-31 DIAGNOSIS — F5101 Primary insomnia: Secondary | ICD-10-CM | POA: Diagnosis not present

## 2020-10-31 DIAGNOSIS — R030 Elevated blood-pressure reading, without diagnosis of hypertension: Secondary | ICD-10-CM | POA: Insufficient documentation

## 2020-10-31 DIAGNOSIS — F17200 Nicotine dependence, unspecified, uncomplicated: Secondary | ICD-10-CM

## 2020-10-31 MED ORDER — ZOLPIDEM TARTRATE 10 MG PO TABS: 10.0000 mg | ORAL_TABLET | Freq: Every evening | ORAL | 1 refills | Status: DC | PRN

## 2020-10-31 NOTE — Assessment & Plan Note (Signed)

## 2020-10-31 NOTE — Assessment & Plan Note (Signed)
Also for ambien qhs prn,  to f/u any worsening symptoms or concerns 

## 2020-10-31 NOTE — Patient Instructions (Signed)
Your preoperative clearance note should be OK to sign when we get it from Urology - Dr Abner Greenspan  Please continue all other medications as before, and refills have been done if requested - the ambien  Please have the pharmacy call with any other refills you may need.  Please continue your efforts at being more active, low cholesterol diet, and weight control.  You are otherwise up to date with prevention measures today.  Please keep your appointments with your specialists as you may have planned  Please go to the LAB at the blood drawing area for the tests to be done  You will be contacted by phone if any changes need to be made immediately.  Otherwise, you will receive a letter about your results with an explanation, but please check with MyChart first.  Please remember to sign up for MyChart if you have not done so, as this will be important to you in the future with finding out test results, communicating by private email, and scheduling acute appointments online when needed.  Please make an Appointment to return in 6 months, or sooner if needed

## 2020-10-31 NOTE — Assessment & Plan Note (Signed)
D/w pt, declines counseling or other specific tx

## 2020-10-31 NOTE — Assessment & Plan Note (Signed)
Pt cleared for prostate biopsy with holding plavix x 7 days prior

## 2020-10-31 NOTE — Progress Notes (Signed)
Established Patient Office Visit  Subjective:  Patient ID: Mark Dunlap, male    DOB: [birth year]  Age: 60 y.o. MRN: [id number]       Chief Complaint:: wellness exam and surgical clearance  recent grief, insomnia, and elevated bp       HPI:  [name] is a 60 y.o. male here for wellness exam and above as he Needs prostate biopsy, and clearance about the plavix for 7 days prior to biopsy for feb 8. Pt denies chest pain, increased sob or doe, wheezing, orthopnea, PND, increased LE swelling, palpitations, dizziness or syncope.  Pt denies new neurological symptoms such as new headache, or facial or extremity weakness or numbness   Pt denies polydipsia, polyuria, has no specific complaints except for owrsening grief x 2 days since his mother and cousin died on the same day, and now with worsening sleep, unable to cope well with this.  Wt Readings from Last 3 Encounters:  10/31/20 170 lb (77.1 kg)  09/11/20 168 lb (76.2 kg)  07/12/20 168 lb (76.2 kg)   BP Readings from Last 3 Encounters:  10/31/20 (!) 146/90  09/12/20 105/73  07/12/20 (!) 162/81    Past Medical History:  Diagnosis Date  . Acute prostatitis 05/23/2009  . Adjustment disorder with depressed mood 09/2007  . Cataract    bilateral - MD just watching  . CERVICAL RADICULOPATHY, LEFT 08/30/2008  . CVA (cerebrovascular accident) ([place])   . Depression   . DJD (degenerative joint disease) of hip   . DJD (degenerative joint disease) of knee    left knee  . GERD (gastroesophageal reflux disease)   . 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 H/O: substance abuse ([place])    hx of ETOH/Crack cocaine-none since 12/2012 per pt/Does not Drive due to this  . HLD (hyperlipidemia)   . HYPERTENSION 08/30/2008  . HYPERTHYROIDISM 02/13/2010   pt was told by  Dr Jenny Reichmann  that thyroid was now back to normal .. 2012 ...   . HYPOTENSION 10/31/2009  . Legally blind in left eye, as defined in [place]     Blind Left eye, small amt vision Right eye  . Other and unspecified hyperlipidemia 08/20/2013  . Seasonal allergies   . SPINAL STENOSIS, CERVICAL 08/08/2009  . Stroke ([place])   . THYROID NODULE, RIGHT 03/02/2010   Past Surgical History:  Procedure Laterality Date  . ANTERIOR CERVICAL DECOMP/DISCECTOMY FUSION  10/11/2011   Procedure: ANTERIOR CERVICAL DECOMPRESSION/DISCECTOMY FUSION 3 LEVELS;  Surgeon: Eustace Moore;  Location: [facility];  Service: Neurosurgery;  Laterality: N/A;  Cervical three-four ,cervical four five cervical five six Anterior Cervical Decompression Fusion with peek + plate Nuvasive translational plate Orthofix peek (2 1/2 hours) Rm # [id number]  . ANTERIOR CERVICAL DECOMP/DISCECTOMY FUSION N/A 07/29/2014   Procedure: ANTERIOR CERVICAL DECOMPRESSION/DISCECTOMY FUSION 1 LEVEL/HARDWARE REMOVAL;  Surgeon: Eustace Moore, MD;  Location: [facility];  Service: Neurosurgery;  Laterality: N/A;  cervical six-seven  . BACK SURGERY  2019   [facility]  . COLONOSCOPY    . HARDWARE REMOVAL N/A 09/11/2020   Procedure: Removal of hardware LUmbar Four-Lumbar Five;  Surgeon: Eustace Moore, MD;  Location: [facility];  Service: Neurosurgery;  Laterality: N/A;  . LAMINECTOMY WITH POSTERIOR LATERAL ARTHRODESIS LEVEL 2 N/A 09/11/2020   Procedure: Laminectomy and Foraminotomy - Lumbar Three-Lumbar Four, posterior lateral fusion Lumbar Three-Four and Lumbar Four- Five;  Surgeon: Eustace Moore, MD;  Location: [facility];  Service: Neurosurgery;  Laterality: N/A;  . POSTERIOR CERVICAL FUSION/FORAMINOTOMY N/A 09/23/2013   Procedure: CERVICALTWO TO CERVICAL SEVEN POSTERIOR CERVICAL FUSION/FORAMINOTOMY WITH LATERAL MASS FIXATION;  Surgeon: Eustace Moore, MD;  Location: [facility];  Service: Neurosurgery;  Laterality: N/A;  . Right Hand I&D  12/07   s/p rdue to abscess-Dr. Amedeo Plenty  . TOTAL HIP ARTHROPLASTY  12/17/2011   Procedure: TOTAL HIP ARTHROPLASTY;  Surgeon: Johnny Bridge, MD;  Location: [facility];  Service:  Orthopedics;  Laterality: Left;    reports that he has been smoking cigarettes. He has a 20.00 pack-year smoking history. He has never used smokeless tobacco. He reports previous drug use. Drug: "Crack" cocaine. He reports that he does not drink alcohol. family history includes Alcohol abuse in his brother, cousin, and father; Arthritis in his mother; Diabetes in his brother and sister; Goiter in his mother and sister; Heart disease in his father and another family member; Hyperlipidemia in his mother; Hypertension in his sister; Stroke in his father. Allergies  Allergen Reactions  . Tramadol Itching  . Morphine And Related Itching  . Oxycodone Itching   Current Outpatient Medications on File Prior to Visit  Medication Sig Dispense Refill  . acetaminophen (TYLENOL) 500 MG tablet Take 500 mg by mouth every 6 (six) hours as needed for moderate pain or headache.     [name] adapalene (DIFFERIN) 0.1 % gel APPLY 1 APPLICATION TOPICALLY AT BEDTIME. 45 g 1  . aspirin EC 81 MG tablet Take 81 mg by mouth daily. Swallow whole.    [name] atorvastatin (LIPITOR) 40 MG tablet TAKE 1 TABLET BY MOUTH ONCE DAILY AT  6  PM (Patient taking differently: Take 40 mg by mouth daily.) 90 tablet 3  . benzoyl peroxide (BENZOYL PEROXIDE) 5 % external liquid Apply 1 application topically 2 (two) times daily. 226 g 0  . bimatoprost (LUMIGAN) 0.01 % SOLN Place 1 drop into both eyes at bedtime.    . citalopram (CELEXA) 20 MG tablet Take 1 tablet (20 mg total) by mouth daily. 90 tablet 3  . clopidogrel (PLAVIX) 75 MG tablet Take 1 tablet (75 mg total) by mouth daily. 90 tablet 3  . diphenhydrAMINE (BENADRYL) 25 mg capsule Take 50 mg by mouth every 6 (six) hours as needed for itching.     . famotidine (PEPCID) 40 MG tablet Take 1 tablet (40 mg total) by mouth 2 (two) times daily. Take every morning and at bedtime 60 tablet 3  . fluticasone (FLONASE) 50 MCG/ACT nasal spray USE 2 SPRAYS DAILY IN BOTH NOSTRILS (Patient taking differently:  Place 2 sprays into both nostrils daily.) 32 mL 0  . gabapentin (NEURONTIN) 100 MG capsule Take 200 mg by mouth 2 (two) times daily.  2  . hydrochlorothiazide (MICROZIDE) 12.5 MG capsule TAKE 1 CAPSULE BY MOUTH EVERY DAY (Patient taking differently: Take 12.5 mg by mouth daily.) 90 capsule 1  . HYDROcodone-acetaminophen (NORCO) 10-325 MG tablet Take 1 tablet by mouth every 6 (six) hours as needed for moderate pain. 60 tablet 0  . imiquimod (ALDARA) 5 % cream APPLY 3 TIMES A WEEK (Patient taking differently: Apply 1 application topically 3 (three) times a week.) 24 each 0  . lidocaine (XYLOCAINE) 2 % solution TAKE AS DIRECTED (Patient taking differently: Use as directed 15 mLs in the mouth or throat daily as needed (acid reflux).) 450 mL 0  . nortriptyline (PAMELOR) 25 MG capsule Take 25 mg by mouth at bedtime.  0  .  ondansetron (ZOFRAN ODT) 4 MG disintegrating tablet Take 1 tablet (4 mg total) by mouth every 8 (eight) hours as needed for nausea or vomiting. 20 tablet 1  . oxybutynin (DITROPAN) 5 MG tablet Take 1 tablet (5 mg total) by mouth at bedtime. 90 tablet 3  . pantoprazole (PROTONIX) 40 MG tablet Take 1 tablet (40 mg total) by mouth daily. 90 tablet 3  . sucralfate (CARAFATE) 1 g tablet Take 1 tablet (1 g total) by mouth 3 (three) times daily. Take 20 minutes after meals and at bedtime 120 tablet 1  . tamsulosin (FLOMAX) 0.4 MG CAPS capsule Take 1 capsule by mouth twice daily 180 capsule 3  . timolol (TIMOPTIC) 0.5 % ophthalmic solution Place 1 drop into both eyes daily.  6  . tiZANidine (ZANAFLEX) 4 MG tablet Take 4 mg by mouth every 8 (eight) hours as needed for muscle spasms.     [name] tretinoin (RETIN-A) 0.1 % cream APPLY  CREAM TOPICALLY TO AFFECTED AREA OF FACE AT BEDTIME (Patient taking differently: Apply 1 application topically at bedtime.) 40 g 1   No current facility-administered medications on file prior to visit.        ROS:  All others reviewed and negative.  Objective         PE:  BP (!) 146/90   Pulse 84   Temp 98.6 F (37 C) (Oral)   Ht 6' (1.829 m)   Wt 170 lb (77.1 kg)   SpO2 98%   BMI 23.06 kg/m                 Constitutional: Pt appears in NAD               HENT: Head: NCAT.                Right Ear: External ear normal.                 Left Ear: External ear normal.                Eyes: . Pupils are equal, round, and reactive to light. Conjunctivae and EOM are normal               Nose: without d/c or deformity               Neck: Neck supple. Gross normal ROM               Cardiovascular: Normal rate and regular rhythm.                 Pulmonary/Chest: Effort normal and breath sounds without rales or wheezing.                Abd:  Soft, NT, ND, + BS, no organomegaly               Neurological: Pt is alert. At baseline orientation, motor grossly intact               Skin: Skin is warm. No rashes, no other new lesions, LE edema - none               Psychiatric: Pt behavior is normal without agitation , sad, depressed affect  Assessment/Plan:  KAMRON VANWYHE is a 60 y.o. Black or African American [2] male with  has a past medical history of Acute prostatitis (05/23/2009), Adjustment disorder with depressed mood (09/2007), Cataract, CERVICAL RADICULOPATHY, LEFT (08/30/2008), CVA (cerebrovascular accident) ([place]), Depression, DJD (degenerative  joint disease) of hip, DJD (degenerative joint disease) of knee, GERD (gastroesophageal reflux disease), GLAUCOMA ASSOCIATED WITH OCULAR DISORDER (08/30/2008), H/O: substance abuse ([place]), HLD (hyperlipidemia), HYPERTENSION (08/30/2008), HYPERTHYROIDISM (02/13/2010), HYPOTENSION (10/31/2009), Legally blind in left eye, as defined in [place], Other and unspecified hyperlipidemia (08/20/2013), Seasonal allergies, SPINAL STENOSIS, CERVICAL (08/08/2009), Stroke ([place]), and THYROID NODULE, RIGHT (03/02/2010).  Assessment Plan  See notes Labs/data reviewed for each problem:  Micro: none  Cardiac tracings I have personally  interpreted today:  none  Pertinent Radiological findings (summarize): none    There are no preventive care reminders to display for this patient.  There are no preventive care reminders to display for this patient.  Lab Results  Component Value Date   TSH 1.33 11/05/2019   Lab Results  Component Value Date   WBC 6.7 09/11/2020   HGB 11.9 (L) 09/11/2020   HCT 38.5 (L) 09/11/2020   MCV 96.3 09/11/2020   PLT 315 09/11/2020   Lab Results  Component Value Date   NA 139 09/11/2020   K 4.1 09/11/2020   CO2 27 09/11/2020   GLUCOSE 88 09/11/2020   BUN 6 09/11/2020   CREATININE 0.93 09/11/2020   BILITOT 0.5 05/02/2020   ALKPHOS 139 (H) 11/05/2019   AST 16 05/02/2020   ALT 21 05/02/2020   PROT 6.7 05/02/2020   ALBUMIN 4.0 11/05/2019   CALCIUM 8.9 09/11/2020   ANIONGAP 6 09/11/2020   GFR 91.25 11/05/2019   Lab Results  Component Value Date   CHOL 152 05/02/2020   Lab Results  Component Value Date   HDL 66 05/02/2020   Lab Results  Component Value Date   LDLCALC 64 05/02/2020   Lab Results  Component Value Date   TRIG 140 05/02/2020   Lab Results  Component Value Date   CHOLHDL 2.3 05/02/2020   Lab Results  Component Value Date   HGBA1C 5.5 05/03/2019      Assessment & Plan:   Problem List Items Addressed This Visit      High   Encounter for well adult exam with abnormal findings - Primary    Overall doing well, age appropriate education and counseling updated, referrals for preventative services and immunizations addressed, dietary and smoking counseling addressed, most recent labs reviewed.  I have personally reviewed and have noted:  1) the patient's medical and social history 2) The pt's use of alcohol, tobacco, and illicit drugs 3) The patient's current medications and supplements 4) Functional ability including ADL's, fall risk, home safety risk, hearing and visual impairment 5) Diet and physical activities 6) Evidence for depression or mood  disorder 7) The patient's height, weight, and BMI have been recorded in the chart  I have made referrals, and provided counseling and education based on review of the above       Relevant Orders   Lipid panel   Hepatic function panel   CBC with Differential/Platelet   TSH   Urinalysis, Routine w reflex microscopic   Basic metabolic panel     Medium   Smoker    Not ready to quit, counseled to quit      Hyperlipidemia    Lab Results  Component Value Date   [place] 64 05/02/2020   Stable, pt to continue current statin - lipitor      Essential hypertension    Mild uncontrolled today,  BP Readings from Last 3 Encounters:  10/31/20 (!) 146/90  09/12/20 105/73  07/12/20 (!) 162/81  declines change in med tx today given  stressors; to contineu current tx  - hct    Current Outpatient Medications (Cardiovascular):  .  atorvastatin (LIPITOR) 40 MG tablet, TAKE 1 TABLET BY MOUTH ONCE DAILY AT  6  PM (Patient taking differently: Take 40 mg by mouth daily.) .  hydrochlorothiazide (MICROZIDE) 12.5 MG capsule, TAKE 1 CAPSULE BY MOUTH EVERY DAY (Patient taking differently: Take 12.5 mg by mouth daily.)  Current Outpatient Medications (Respiratory):  .  diphenhydrAMINE (BENADRYL) 25 mg capsule, Take 50 mg by mouth every 6 (six) hours as needed for itching.  .  fluticasone (FLONASE) 50 MCG/ACT nasal spray, USE 2 SPRAYS DAILY IN BOTH NOSTRILS (Patient taking differently: Place 2 sprays into both nostrils daily.)  Current Outpatient Medications (Analgesics):  .  acetaminophen (TYLENOL) 500 MG tablet, Take 500 mg by mouth every 6 (six) hours as needed for moderate pain or headache.  [name]  aspirin EC 81 MG tablet, Take 81 mg by mouth daily. Swallow whole. [name]  HYDROcodone-acetaminophen (NORCO) 10-325 MG tablet, Take 1 tablet by mouth every 6 (six) hours as needed for moderate pain.  Current Outpatient Medications (Hematological):  .  clopidogrel (PLAVIX) 75 MG tablet, Take 1 tablet (75 mg  total) by mouth daily.  Current Outpatient Medications (Other):  .  adapalene (DIFFERIN) 0.1 % gel, APPLY 1 APPLICATION TOPICALLY AT BEDTIME. .  benzoyl peroxide (BENZOYL PEROXIDE) 5 % external liquid, Apply 1 application topically 2 (two) times daily. .  bimatoprost (LUMIGAN) 0.01 % SOLN, Place 1 drop into both eyes at bedtime. .  citalopram (CELEXA) 20 MG tablet, Take 1 tablet (20 mg total) by mouth daily. .  famotidine (PEPCID) 40 MG tablet, Take 1 tablet (40 mg total) by mouth 2 (two) times daily. Take every morning and at bedtime .  gabapentin (NEURONTIN) 100 MG capsule, Take 200 mg by mouth 2 (two) times daily. .  imiquimod (ALDARA) 5 % cream, APPLY 3 TIMES A WEEK (Patient taking differently: Apply 1 application topically 3 (three) times a week.) .  lidocaine (XYLOCAINE) 2 % solution, TAKE AS DIRECTED (Patient taking differently: Use as directed 15 mLs in the mouth or throat daily as needed (acid reflux).) .  nortriptyline (PAMELOR) 25 MG capsule, Take 25 mg by mouth at bedtime. .  ondansetron (ZOFRAN ODT) 4 MG disintegrating tablet, Take 1 tablet (4 mg total) by mouth every 8 (eight) hours as needed for nausea or vomiting. [name]  oxybutynin (DITROPAN) 5 MG tablet, Take 1 tablet (5 mg total) by mouth at bedtime. .  pantoprazole (PROTONIX) 40 MG tablet, Take 1 tablet (40 mg total) by mouth daily. .  sucralfate (CARAFATE) 1 g tablet, Take 1 tablet (1 g total) by mouth 3 (three) times daily. Take 20 minutes after meals and at bedtime .  tamsulosin (FLOMAX) 0.4 MG CAPS capsule, Take 1 capsule by mouth twice daily .  timolol (TIMOPTIC) 0.5 % ophthalmic solution, Place 1 drop into both eyes daily. [name]  tiZANidine (ZANAFLEX) 4 MG tablet, Take 4 mg by mouth every 8 (eight) hours as needed for muscle spasms.  [name]  tretinoin (RETIN-A) 0.1 % cream, APPLY  CREAM TOPICALLY TO AFFECTED AREA OF FACE AT BEDTIME (Patient taking differently: Apply 1 application topically at bedtime.) .  zolpidem (AMBIEN) 10 MG  tablet, Take 1 tablet (10 mg total) by mouth at bedtime as needed. for sleep         Low   Preop exam for internal medicine    Pt cleared for prostate biopsy with holding plavix x 7  days prior      Insomnia (Chronic)    Also for ambien qhs prn,  to f/u any worsening symptoms or concerns      Grief    D/w pt, declines counseling or other specific tx       Other Visit Diagnoses    B12 deficiency       Relevant Orders   Vitamin B12   Vitamin D deficiency       Relevant Orders   VITAMIN D 25 Hydroxy (Vit-D Deficiency, Fractures)      Meds ordered this encounter  Medications  . zolpidem (AMBIEN) 10 MG tablet    Sig: Take 1 tablet (10 mg total) by mouth at bedtime as needed. for sleep    Dispense:  90 tablet    Refill:  1    Follow-up: Return in about 6 months (around 04/30/2021).    Cathlean Cower, MD

## 2020-10-31 NOTE — Assessment & Plan Note (Signed)
Not ready to quit, counseled to quit

## 2020-10-31 NOTE — Assessment & Plan Note (Signed)
Lab Results  Component Value Date   LDLCALC 64 05/02/2020   Stable, pt to continue current statin - lipitor

## 2020-10-31 NOTE — Assessment & Plan Note (Signed)
Mild uncontrolled today,  BP Readings from Last 3 Encounters:  10/31/20 (!) 146/90  09/12/20 105/73  07/12/20 (!) 162/81  declines change in med tx today given stressors; to contineu current tx  - hct    Current Outpatient Medications (Cardiovascular):  .  atorvastatin (LIPITOR) 40 MG tablet, TAKE 1 TABLET BY MOUTH ONCE DAILY AT  6  PM (Patient taking differently: Take 40 mg by mouth daily.) .  hydrochlorothiazide (MICROZIDE) 12.5 MG capsule, TAKE 1 CAPSULE BY MOUTH EVERY DAY (Patient taking differently: Take 12.5 mg by mouth daily.)  Current Outpatient Medications (Respiratory):  .  diphenhydrAMINE (BENADRYL) 25 mg capsule, Take 50 mg by mouth every 6 (six) hours as needed for itching.  .  fluticasone (FLONASE) 50 MCG/ACT nasal spray, USE 2 SPRAYS DAILY IN BOTH NOSTRILS (Patient taking differently: Place 2 sprays into both nostrils daily.)  Current Outpatient Medications (Analgesics):  .  acetaminophen (TYLENOL) 500 MG tablet, Take 500 mg by mouth every 6 (six) hours as needed for moderate pain or headache.  [name]  aspirin EC 81 MG tablet, Take 81 mg by mouth daily. Swallow whole. [name]  HYDROcodone-acetaminophen (NORCO) 10-325 MG tablet, Take 1 tablet by mouth every 6 (six) hours as needed for moderate pain.  Current Outpatient Medications (Hematological):  .  clopidogrel (PLAVIX) 75 MG tablet, Take 1 tablet (75 mg total) by mouth daily.  Current Outpatient Medications (Other):  .  adapalene (DIFFERIN) 0.1 % gel, APPLY 1 APPLICATION TOPICALLY AT BEDTIME. .  benzoyl peroxide (BENZOYL PEROXIDE) 5 % external liquid, Apply 1 application topically 2 (two) times daily. .  bimatoprost (LUMIGAN) 0.01 % SOLN, Place 1 drop into both eyes at bedtime. .  citalopram (CELEXA) 20 MG tablet, Take 1 tablet (20 mg total) by mouth daily. .  famotidine (PEPCID) 40 MG tablet, Take 1 tablet (40 mg total) by mouth 2 (two) times daily. Take every morning and at bedtime .  gabapentin (NEURONTIN) 100 MG capsule, Take  200 mg by mouth 2 (two) times daily. .  imiquimod (ALDARA) 5 % cream, APPLY 3 TIMES A WEEK (Patient taking differently: Apply 1 application topically 3 (three) times a week.) .  lidocaine (XYLOCAINE) 2 % solution, TAKE AS DIRECTED (Patient taking differently: Use as directed 15 mLs in the mouth or throat daily as needed (acid reflux).) .  nortriptyline (PAMELOR) 25 MG capsule, Take 25 mg by mouth at bedtime. .  ondansetron (ZOFRAN ODT) 4 MG disintegrating tablet, Take 1 tablet (4 mg total) by mouth every 8 (eight) hours as needed for nausea or vomiting. [name]  oxybutynin (DITROPAN) 5 MG tablet, Take 1 tablet (5 mg total) by mouth at bedtime. .  pantoprazole (PROTONIX) 40 MG tablet, Take 1 tablet (40 mg total) by mouth daily. .  sucralfate (CARAFATE) 1 g tablet, Take 1 tablet (1 g total) by mouth 3 (three) times daily. Take 20 minutes after meals and at bedtime .  tamsulosin (FLOMAX) 0.4 MG CAPS capsule, Take 1 capsule by mouth twice daily .  timolol (TIMOPTIC) 0.5 % ophthalmic solution, Place 1 drop into both eyes daily. [name]  tiZANidine (ZANAFLEX) 4 MG tablet, Take 4 mg by mouth every 8 (eight) hours as needed for muscle spasms.  [name]  tretinoin (RETIN-A) 0.1 % cream, APPLY  CREAM TOPICALLY TO AFFECTED AREA OF FACE AT BEDTIME (Patient taking differently: Apply 1 application topically at bedtime.) .  zolpidem (AMBIEN) 10 MG tablet, Take 1 tablet (10 mg total) by mouth at bedtime as needed. for sleep

## 2020-11-01 ENCOUNTER — Ambulatory Visit: Payer: Medicare Other | Admitting: Internal Medicine

## 2020-11-14 ENCOUNTER — Telehealth: Payer: Self-pay | Admitting: Internal Medicine

## 2020-11-14 NOTE — Telephone Encounter (Signed)
Ok done Forensic psychologist to [employer]

## 2020-11-14 NOTE — Telephone Encounter (Signed)
[facility] requesting last office with surgical clearance information be faxed to [phone]

## 2020-11-15 ENCOUNTER — Telehealth: Payer: Self-pay

## 2020-11-15 NOTE — Telephone Encounter (Signed)
Documents fax patient notified

## 2020-11-15 NOTE — Telephone Encounter (Signed)
[facility] called again to check on the status of the surgical clearance information. I did inform her that it was done but she hasn't received it yet. Please fax over to her ASAP at [phone].   Patient needs to stop his blood thinner prior to the surgery.

## 2020-12-10 ENCOUNTER — Other Ambulatory Visit: Payer: Self-pay | Admitting: Internal Medicine

## 2020-12-10 NOTE — Telephone Encounter (Signed)
Please refill as per office routine med refill policy (all routine meds refilled for 3 mo or monthly per pt preference up to one year from last visit, then month to month grace period for 3 mo, then further med refills will have to be denied)  

## 2020-12-11 ENCOUNTER — Encounter: Payer: Self-pay | Admitting: Radiation Oncology
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
## 2020-12-12 ENCOUNTER — Ambulatory Visit
Admission: RE | Admit: 2020-12-12 | Discharge: 2020-12-12 | Disposition: A | Discharge status: Home / Self Care | Payer: Medicare Other | Source: Ambulatory Visit | Attending: Radiation Oncology | Admitting: Radiation Oncology

## 2020-12-12 ENCOUNTER — Encounter: Payer: Self-pay | Admitting: Medical Oncology

## 2020-12-12 ENCOUNTER — Encounter: Payer: Self-pay | Admitting: Radiation Oncology

## 2020-12-12 ENCOUNTER — Other Ambulatory Visit: Payer: Self-pay

## 2020-12-12 VITALS — BP 125/86 | HR 73 | Temp 97.1°F | Resp 18 | Ht 72.0 in | Wt 175.1 lb

## 2020-12-12 DIAGNOSIS — F17210 Nicotine dependence, cigarettes, uncomplicated: Secondary | ICD-10-CM | POA: Diagnosis not present

## 2020-12-12 DIAGNOSIS — Z923 Personal history of irradiation: Secondary | ICD-10-CM | POA: Insufficient documentation

## 2020-12-12 DIAGNOSIS — C61 Malignant neoplasm of prostate: Secondary | ICD-10-CM | POA: Insufficient documentation

## 2020-12-12 DIAGNOSIS — I10 Essential (primary) hypertension: Secondary | ICD-10-CM | POA: Diagnosis not present

## 2020-12-12 DIAGNOSIS — K219 Gastro-esophageal reflux disease without esophagitis: Secondary | ICD-10-CM | POA: Insufficient documentation

## 2020-12-12 DIAGNOSIS — Z79899 Other long term (current) drug therapy: Secondary | ICD-10-CM | POA: Insufficient documentation

## 2020-12-12 DIAGNOSIS — Z7982 Long term (current) use of aspirin: Secondary | ICD-10-CM | POA: Diagnosis not present

## 2020-12-12 DIAGNOSIS — Z8673 Personal history of transient ischemic attack (TIA), and cerebral infarction without residual deficits: Secondary | ICD-10-CM | POA: Diagnosis not present

## 2020-12-12 HISTORY — DX: Malignant neoplasm of prostate: C61

## 2020-12-12 NOTE — Progress Notes (Signed)
Radiation Oncology         [phone] ________________________________  Initial outpatient Consultation  Name: Mark Dunlap MRN: [id number]  Date: 12/12/2020  DOB: 03/08/1961  CC:John, James W, MD  Gay, Matthew R, MD   REFERRING PHYSICIAN: Gay, Matthew R, MD  DIAGNOSIS: 59 y.o. gentleman with Stage T1c adenocarcinoma of the prostate with Gleason score of 4+3, and PSA of 5.09.    ICD-10-CM   1. Malignant neoplasm of prostate (HCC)  C61     HISTORY OF PRESENT ILLNESS: Mark Dunlap is a 59 y.o. male with a diagnosis of prostate cancer. He is an established urology patient, previously seen by Dr. Bell for an episode of hematuria in 08/2017 with a negative hematuria evaluation at that time. He was referred back to Dr. Bell in 12/2018 for an elevated PSA of 4.73. He underwent prostate biopsy on 05/11/2019, and pathology from the procedure was benign.  More recently, on 07/26/2020 his PSA increased to 5.09, so, in Dr. Bell's absence, he was seen by Dr. Gay on 08/03/2020, digital rectal examination was performed at that time revealing no nodules. He underwent prostate MRI on 10/18/2020 showing a PIRADS 2 lesion with sequela of prostatitis and mild BPH as well as a subtle nodular area seen along the left urinary bladder. The patient proceeded to transrectal ultrasound with 12 biopsies of the prostate on 11/21/2020.  The prostate volume measured 23 cc.  Out of 12 core biopsies, 5 were positive, all on the right.  The maximum Gleason score was 4+3, and this was seen in the right apex lateral (small focus) and right apex. Additionally, Gleason 3+4 was seen in the right mid lateral, right base lateral (small focus), and right mid (with PNI). In office cystoscopy was also performed the same day and showed no evidence for any concerning bladder lesions.  The patient reviewed the biopsy results with his urologist and was adamantly not interested in prostatectomy. Therefore, he has kindly been referred  today for discussion of potential radiation treatment options. He was also interested in obtaining a second opinion with another urologist in [facility] and is tentatively scheduled for a consult visit with Dr. Wrenn on 01/15/2021.   PREVIOUS RADIATION THERAPY: No  PAST MEDICAL HISTORY:  Past Medical History:  Diagnosis Date  . Acute prostatitis 05/23/2009  . Adjustment disorder with depressed mood 09/2007  . Cataract    bilateral - MD just watching  . CERVICAL RADICULOPATHY, LEFT 08/30/2008  . CVA (cerebrovascular accident) (HCC)   . Depression   . DJD (degenerative joint disease) of hip   . DJD (degenerative joint disease) of knee    left knee  . GERD (gastroesophageal reflux disease)   . 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 H/O: substance abuse (HCC)    hx of ETOH/Crack cocaine-none since 12/2012 per pt/Does not Drive due to this  . HLD (hyperlipidemia)   . HYPERTENSION 08/30/2008  . HYPERTHYROIDISM 02/13/2010   pt was told by  Dr John  that thyroid was now back to normal .. 2012 ...   . HYPOTENSION 10/31/2009  . Legally blind in left eye, as defined in [place]    Blind Left eye, small amt vision Right eye  . Other and unspecified hyperlipidemia 08/20/2013  . Prostate cancer (HCC)   . Seasonal allergies   . SPINAL STENOSIS, CERVICAL 08/08/2009  . Stroke (HCC)   . THYROID NODULE, RIGHT 03/02/2010        PAST SURGICAL HISTORY: Past Surgical History:  Procedure Laterality Date  . ANTERIOR CERVICAL DECOMP/DISCECTOMY FUSION  10/11/2011   Procedure: ANTERIOR CERVICAL DECOMPRESSION/DISCECTOMY FUSION 3 LEVELS;  Surgeon: Eustace Moore;  Location: [facility];  Service: Neurosurgery;  Laterality: N/A;  Cervical three-four ,cervical four five cervical five six Anterior Cervical Decompression Fusion with peek + plate Nuvasive translational plate Orthofix peek (2 1/2 hours) Rm # [id number]  . ANTERIOR CERVICAL DECOMP/DISCECTOMY FUSION N/A  07/29/2014   Procedure: ANTERIOR CERVICAL DECOMPRESSION/DISCECTOMY FUSION 1 LEVEL/HARDWARE REMOVAL;  Surgeon: Eustace Moore, MD;  Location: [facility];  Service: Neurosurgery;  Laterality: N/A;  cervical six-seven  . BACK SURGERY  2019   [facility]  . COLONOSCOPY    . HARDWARE REMOVAL N/A 09/11/2020   Procedure: Removal of hardware LUmbar Four-Lumbar Five;  Surgeon: Eustace Moore, MD;  Location: [facility];  Service: Neurosurgery;  Laterality: N/A;  . LAMINECTOMY WITH POSTERIOR LATERAL ARTHRODESIS LEVEL 2 N/A 09/11/2020   Procedure: Laminectomy and Foraminotomy - Lumbar Three-Lumbar Four, posterior lateral fusion Lumbar Three-Four and Lumbar Four- Five;  Surgeon: Eustace Moore, MD;  Location: [facility];  Service: Neurosurgery;  Laterality: N/A;  . POSTERIOR CERVICAL FUSION/FORAMINOTOMY N/A 09/23/2013   Procedure: CERVICALTWO TO CERVICAL SEVEN POSTERIOR CERVICAL FUSION/FORAMINOTOMY WITH LATERAL MASS FIXATION;  Surgeon: Eustace Moore, MD;  Location: [facility];  Service: Neurosurgery;  Laterality: N/A;  . Right Hand I&D  12/07   s/p rdue to abscess-Dr. Amedeo Plenty  . TOTAL HIP ARTHROPLASTY  12/17/2011   Procedure: TOTAL HIP ARTHROPLASTY;  Surgeon: Johnny Bridge, MD;  Location: [facility];  Service: Orthopedics;  Laterality: Left;    FAMILY HISTORY:  Family History  Problem Relation Age of Onset  . Alcohol abuse Father   . Stroke Father   . Heart disease Father   . Arthritis Mother   . Hyperlipidemia Mother   . Goiter Mother        resection of benign goiter  . Hypertension Sister   . Diabetes Sister   . Goiter Sister        resection of benign goiter  . Cancer Sister        oldest.type unknown.  . Diabetes Brother   . Cancer Brother        oldest.type unknown.  . Alcohol abuse Brother   . Alcohol abuse Cousin   . Heart disease Other        Aunt  . Colon cancer Neg Hx   . Colon polyps Neg Hx   . Esophageal cancer Neg Hx   . Rectal cancer Neg Hx   . Stomach cancer Neg Hx   . Breast cancer Neg  Hx   . Prostate cancer Neg Hx   . Pancreatic cancer Neg Hx     SOCIAL HISTORY:  Social History   Socioeconomic History  . Marital status: Single    Spouse name: Not on file  . Number of children: 0  . Years of education: Not on file  . Highest education level: Not on file  Occupational History  . Occupation: Industries for [employer]  . Smoking status: Current Every Day Smoker    Packs/day: 0.50    Years: 40.00    Pack years: 20.00    Types: Cigarettes  . Smokeless tobacco: Never Used  Vaping Use  . Vaping Use: Never used  Substance and Sexual Activity  . Alcohol use: No    Alcohol/week: 0.0 standard drinks  Comment: hx of alcohol abuse quit 2014  . Drug use: Not Currently    Types: "Crack" cocaine    Comment: last use 12/2012  . Sexual activity: Not Currently  Other Topics Concern  . Not on file  Social History Narrative  . Not on file   Social Determinants of Health   Financial Resource Strain: Not on file  Food Insecurity: Not on file  Transportation Needs: Not on file  Physical Activity: Not on file  Stress: Not on file  Social Connections: Not on file  Intimate Partner Violence: Not on file    ALLERGIES: Acetaminophen, Tramadol, Morphine and related, and Oxycodone  MEDICATIONS:  Current Outpatient Medications  Medication Sig Dispense Refill  . adapalene (DIFFERIN) 0.1 % gel APPLY 1 APPLICATION TOPICALLY AT BEDTIME. 45 g 1  . aspirin EC 81 MG tablet Take 81 mg by mouth daily. Swallow whole.    . atorvastatin (LIPITOR) 40 MG tablet TAKE 1 TABLET BY MOUTH ONCE DAILY AT  6  PM (Patient taking differently: Take 40 mg by mouth daily.) 90 tablet 3  . bimatoprost (LUMIGAN) 0.01 % SOLN Place 1 drop into both eyes at bedtime.    . citalopram (CELEXA) 20 MG tablet Take 1 tablet (20 mg total) by mouth daily. 90 tablet 3  . clopidogrel (PLAVIX) 75 MG tablet Take 1 tablet (75 mg total) by mouth daily. 90 tablet 3  . diazepam (VALIUM) 10 MG tablet  SMARTSIG:1 Tablet(s) By Mouth    . diphenhydrAMINE (BENADRYL) 25 mg capsule Take 50 mg by mouth every 6 (six) hours as needed for itching.     . famotidine (PEPCID) 40 MG tablet Take 1 tablet (40 mg total) by mouth 2 (two) times daily. Take every morning and at bedtime 60 tablet 3  . fluticasone (FLONASE) 50 MCG/ACT nasal spray USE 2 SPRAYS DAILY IN BOTH NOSTRILS (Patient taking differently: Place 2 sprays into both nostrils daily.) 32 mL 0  . gabapentin (NEURONTIN) 100 MG capsule Take 200 mg by mouth 2 (two) times daily.  2  . hydrochlorothiazide (MICROZIDE) 12.5 MG capsule TAKE 1 CAPSULE BY MOUTH EVERY DAY 90 capsule 1  . HYDROcodone-acetaminophen (NORCO) 10-325 MG tablet Take 1 tablet by mouth every 6 (six) hours as needed for moderate pain. 60 tablet 0  . imiquimod (ALDARA) 5 % cream APPLY 3 TIMES A WEEK (Patient taking differently: Apply 1 application topically 3 (three) times a week.) 24 each 0  . lidocaine (XYLOCAINE) 2 % solution TAKE AS DIRECTED (Patient taking differently: Use as directed 15 mLs in the mouth or throat daily as needed (acid reflux).) 450 mL 0  . Multiple Vitamins-Minerals (ONE DAILY FOR MEN/LYCOPENE) TABS     . nortriptyline (PAMELOR) 25 MG capsule Take 25 mg by mouth at bedtime.  0  . ondansetron (ZOFRAN ODT) 4 MG disintegrating tablet Take 1 tablet (4 mg total) by mouth every 8 (eight) hours as needed for nausea or vomiting. 20 tablet 1  . oxybutynin (DITROPAN) 5 MG tablet Take 1 tablet (5 mg total) by mouth at bedtime. 90 tablet 3  . pantoprazole (PROTONIX) 40 MG tablet Take 1 tablet (40 mg total) by mouth daily. 90 tablet 3  . sucralfate (CARAFATE) 1 g tablet Take 1 tablet (1 g total) by mouth 3 (three) times daily. Take 20 minutes after meals and at bedtime 120 tablet 1  . tamsulosin (FLOMAX) 0.4 MG CAPS capsule Take 1 capsule by mouth twice daily 180 capsule 3  . timolol (TIMOPTIC)   0.5 % ophthalmic solution Place 1 drop into both eyes daily.  6  . tiZANidine  (ZANAFLEX) 4 MG tablet Take 4 mg by mouth every 8 (eight) hours as needed for muscle spasms.     [name] tretinoin (RETIN-A) 0.1 % cream APPLY  CREAM TOPICALLY TO AFFECTED AREA OF FACE AT BEDTIME (Patient taking differently: Apply 1 application topically at bedtime.) 40 g 1  . zolpidem (AMBIEN) 10 MG tablet Take 1 tablet (10 mg total) by mouth at bedtime as needed. for sleep 90 tablet 1  . sildenafil (VIAGRA) 100 MG tablet Take 100 mg by mouth daily. (Patient not taking: Reported on 12/12/2020)     No current facility-administered medications for this encounter.    REVIEW OF SYSTEMS:  On review of systems, the patient reports that he is doing well overall. He denies any chest pain, shortness of breath, cough, fevers, chills, night sweats, unintended weight changes. He denies any bowel disturbances, and denies abdominal pain, nausea or vomiting. He denies any new musculoskeletal or joint aches or pains. His IPSS was 8, indicating mild urinary symptoms. He reports an occasional sensation of incompleted bladder emptying, mild urinary frequency, occasional weak and intermittent urine stream, and nocturia x1. His SHIM was 14, indicating he has moderate erectile dysfunction. A complete review of systems is obtained and is otherwise negative.    PHYSICAL EXAM:  Wt Readings from Last 3 Encounters:  12/12/20 175 lb 2 oz (79.4 kg)  10/31/20 170 lb (77.1 kg)  09/11/20 168 lb (76.2 kg)   Temp Readings from Last 3 Encounters:  12/12/20 (!) 97.1 F (36.2 C) (Temporal)  10/31/20 98.6 F (37 C) (Oral)  09/12/20 97.9 F (36.6 C) (Oral)   BP Readings from Last 3 Encounters:  12/12/20 125/86  10/31/20 (!) 146/90  09/12/20 105/73   Pulse Readings from Last 3 Encounters:  12/12/20 73  10/31/20 84  09/12/20 (!) 57   Pain Assessment Pain Score: 0-No pain/10  In general this is a well appearing African-American male in no acute distress. He's alert and oriented x4 and appropriate throughout the examination.  Cardiopulmonary assessment is negative for acute distress, and he exhibits normal effort.     KPS = 100  100 - Normal; no complaints; no evidence of disease. 90   - Able to carry on normal activity; minor signs or symptoms of disease. 80   - Normal activity with effort; some signs or symptoms of disease. 69   - Cares for self; unable to carry on normal activity or to do active work. 60   - Requires occasional assistance, but is able to care for most of his personal needs. 50   - Requires considerable assistance and frequent medical care. 22   - Disabled; requires special care and assistance. 46   - Severely disabled; hospital admission is indicated although death not imminent. 8   - Very sick; hospital admission necessary; active supportive treatment necessary. 10   - Moribund; fatal processes progressing rapidly. 0     - Dead  Karnofsky DA, Abelmann Rockland, Craver LS and Burchenal [facility] [phone]) The use of the nitrogen mustards in the palliative treatment of carcinoma: with particular reference to bronchogenic carcinoma Cancer 1 634-56  LABORATORY DATA:  Lab Results  Component Value Date   WBC 6.7 09/11/2020   HGB 11.9 (L) 09/11/2020   HCT 38.5 (L) 09/11/2020   MCV 96.3 09/11/2020   PLT 315 09/11/2020   Lab Results  Component Value Date   NA  139 09/11/2020   K 4.1 09/11/2020   CL 106 09/11/2020   CO2 27 09/11/2020   Lab Results  Component Value Date   ALT 21 05/02/2020   AST 16 05/02/2020   ALKPHOS 139 (H) 11/05/2019   BILITOT 0.5 05/02/2020     RADIOGRAPHY: No results found.    IMPRESSION/PLAN: 1. 59 y.o. gentleman with Stage T1c adenocarcinoma of the prostate with Gleason Score of 4+3, and PSA of 5.09. We discussed the patient's workup and outlined the nature of prostate cancer in this setting. The patient's T stage, Gleason's score, and PSA put him into the unfavorable intermediate risk group. Accordingly, he is eligible for a variety of potential treatment options  including brachytherapy, 5.5 weeks of external radiation, or prostatectomy. We discussed the available radiation techniques, and focused on the details and logistics of delivery. We discussed and outlined the risks, benefits, short and long-term effects associated with radiotherapy and compared and contrasted these with prostatectomy. We discussed the role of SpaceOAR gel in reducing the rectal toxicity associated with radiotherapy.  He appears to have a good understanding of his disease and our treatment recommendations which are of curative intent.  He was encouraged to ask questions that were answered to his stated satisfaction.  At the conclusion of our conversation, the patient is interested in moving forward with brachytherapy and use of SpaceOAR gel to reduce rectal toxicity from radiotherapy and would prefer to have the procedure done with Dr. Wrenn. He is tentatively scheduled for consultation with Dr. Wrenn on 01/15/2021, but, in an effort to further delay treatment, we will see if we can get this moved up sooner. We will share our discussion with Dr. Wrenn and pending he is in agreement, we will move forward with scheduling his CT SIM planning appointment at that time.  The patient met briefly with Shirley Halsey in our office who will be working closely with him to coordinate OR scheduling and pre and post procedure appointments.  We will contact the pharmaceutical rep to ensure that SpaceOAR is available at the time of procedure.  We enjoyed meeting him today and look forward to continuing to participate in his care.    Ashlyn W. Bruning, PA-C    Matthew Manning, MD  [facility] Direct Dial: [phone]  Fax: [phone] [email or web address]  Skype  [email or web address]   This document serves as a record of services personally performed by Matthew Manning, MD and Ashlyn Bruning, PA-C. It was created on their behalf by Katie Daubenspeck, a trained medical scribe. The creation of this  record is based on the scribe's personal observations and the provider's statements to them. This document has been checked and approved by the attending provider.    

## 2020-12-13 ENCOUNTER — Ambulatory Visit: Payer: Medicare Other

## 2020-12-19 ENCOUNTER — Telehealth: Payer: Self-pay | Admitting: *Deleted

## 2020-12-19 NOTE — Telephone Encounter (Signed)
CALLED PATIENT TO INFORM OF [facility] APPT. WITH DR. Jeffie Pollock ON 01-05-21- ARRIVAL TIME- 1:15 PM, LVM FOR A RETURN CALL

## 2020-12-20 ENCOUNTER — Telehealth: Payer: Self-pay | Admitting: *Deleted

## 2020-12-20 NOTE — Telephone Encounter (Signed)
CALLED PATIENT TO INFORM OF  VISIT WITH DR. Jeffie Pollock ON 01-05-21- ARRIVAL TIME- 1:15 PM @ [facility], SPOKE WITH PATIENT AND HE IS AWARE OF THIS APPT.

## 2021-01-04 DIAGNOSIS — M25551 Pain in right hip: Secondary | ICD-10-CM | POA: Insufficient documentation

## 2021-01-05 ENCOUNTER — Ambulatory Visit (HOSPITAL_COMMUNITY)
Admission: RE | Admit: 2021-01-05 | Discharge: 2021-01-05 | Disposition: A | Discharge status: Home / Self Care | Payer: Medicare Other | Source: Ambulatory Visit | Attending: Cardiology | Admitting: Cardiology

## 2021-01-05 ENCOUNTER — Other Ambulatory Visit: Payer: Self-pay

## 2021-01-05 ENCOUNTER — Telehealth: Payer: Self-pay | Admitting: *Deleted

## 2021-01-05 ENCOUNTER — Ambulatory Visit: Payer: Medicare Other | Admitting: Internal Medicine

## 2021-01-05 ENCOUNTER — Encounter: Payer: Self-pay | Admitting: Internal Medicine

## 2021-01-05 DIAGNOSIS — M79662 Pain in left lower leg: Secondary | ICD-10-CM | POA: Insufficient documentation

## 2021-01-05 DIAGNOSIS — C61 Malignant neoplasm of prostate: Secondary | ICD-10-CM | POA: Diagnosis not present

## 2021-01-05 DIAGNOSIS — M7989 Other specified soft tissue disorders: Secondary | ICD-10-CM | POA: Diagnosis present

## 2021-01-05 DIAGNOSIS — I10 Essential (primary) hypertension: Secondary | ICD-10-CM | POA: Diagnosis not present

## 2021-01-05 MED ORDER — HYDROCODONE-ACETAMINOPHEN 10-325 MG PO TABS: 1.0000 | ORAL_TABLET | Freq: Four times a day (QID) | ORAL | 0 refills | Status: DC | PRN

## 2021-01-05 NOTE — Patient Instructions (Addendum)
Please take all new medication as prescribed - the pain medication as needed  Please continue all other medications as before, and refills have been done if requested.  Please have the pharmacy call with any other refills you may need.  Please continue your efforts at being more active, low cholesterol diet, and weight control.  Please keep your appointments with your specialists as you may have planned  You will be contacted regarding the referral for: venous doppler for the left leg (asap)  If the test is positive for blood clot, we will need to add Eliquis as a blood thinner, AND you will need to stop the plavix  If you start the Eliquis, you will NOT be able to stop the medication for surgury or other procedures for 3 months, so this may complicate your prostate cancer treatment, but we have to decide further on this only at a later time

## 2021-01-05 NOTE — Telephone Encounter (Signed)
Rec'd call report per Mark Dunlap Negative for DVT, looks like he had a growing lymph node in leg. Pt has a large hematoma in upper calf and is in a lot of pain. Requesting call back on what he meed to do.Marland KitchenJohny Dunlap

## 2021-01-05 NOTE — Progress Notes (Signed)
Patient ID: Mark Dunlap, male   DOB: 09-19-1961, 60 y.o.   MRN: [id number]        Chief Complaint: left lower leg pain and swelling       HPI:  Mark Dunlap is a 60 y.o. male here after seeing urology this am with new diagnosis prostate cancer, may need seed XRT with tentative plan in the next 1-2 wks.  But here to c/o sudden onset worsening left lower leg pain and swelling worse in the post gastroc/calf area but involving the whole leg as well x 1 wk, mod to severe pain, cannot recall any specific trauma or fall, just woke up with it one day, constant, walking really makes it wore, nothing seems to make better, not even ice and heat.  Pt denies chest pain, increased sob or doe, wheezing, orthopnea, PND,  palpitations, dizziness or syncope.   Pt denies fever, wt loss, night sweats, loss of appetite, or other constitutional symptoms  Denies new worsening neuro s/s.  Remains on plavix. Wt Readings from Last 3 Encounters:  01/05/21 177 lb (80.3 kg)  12/12/20 175 lb 2 oz (79.4 kg)  10/31/20 170 lb (77.1 kg)   BP Readings from Last 3 Encounters:  01/05/21 140/80  12/12/20 125/86  10/31/20 (!) 146/90         Past Medical History:  Diagnosis Date  . Acute prostatitis 05/23/2009  . Adjustment disorder with depressed mood 09/2007  . Cataract    bilateral - MD just watching  . CERVICAL RADICULOPATHY, LEFT 08/30/2008  . CVA (cerebrovascular accident) ([place])   . Depression   . DJD (degenerative joint disease) of hip   . DJD (degenerative joint disease) of knee    left knee  . GERD (gastroesophageal reflux disease)   . 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 H/O: substance abuse ([place])    hx of ETOH/Crack cocaine-none since 12/2012 per pt/Does not Drive due to this  . HLD (hyperlipidemia)   . HYPERTENSION 08/30/2008  . HYPERTHYROIDISM 02/13/2010   pt was told by  Dr Jenny Reichmann  that thyroid was now back to normal .. 2012 ...   . HYPOTENSION  10/31/2009  . 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 Other and unspecified hyperlipidemia 08/20/2013  . Prostate cancer ([place])   . Seasonal allergies   . SPINAL STENOSIS, CERVICAL 08/08/2009  . Stroke ([place])   . THYROID NODULE, RIGHT 03/02/2010   Past Surgical History:  Procedure Laterality Date  . ANTERIOR CERVICAL DECOMP/DISCECTOMY FUSION  10/11/2011   Procedure: ANTERIOR CERVICAL DECOMPRESSION/DISCECTOMY FUSION 3 LEVELS;  Surgeon: Eustace Moore;  Location: [facility];  Service: Neurosurgery;  Laterality: N/A;  Cervical three-four ,cervical four five cervical five six Anterior Cervical Decompression Fusion with peek + plate Nuvasive translational plate Orthofix peek (2 1/2 hours) Rm # [id number]  . ANTERIOR CERVICAL DECOMP/DISCECTOMY FUSION N/A 07/29/2014   Procedure: ANTERIOR CERVICAL DECOMPRESSION/DISCECTOMY FUSION 1 LEVEL/HARDWARE REMOVAL;  Surgeon: Eustace Moore, MD;  Location: [facility];  Service: Neurosurgery;  Laterality: N/A;  cervical six-seven  . BACK SURGERY  2019   [facility]  . COLONOSCOPY    . HARDWARE REMOVAL N/A 09/11/2020   Procedure: Removal of hardware LUmbar Four-Lumbar Five;  Surgeon: Eustace Moore, MD;  Location: [facility];  Service: Neurosurgery;  Laterality: N/A;  . LAMINECTOMY WITH POSTERIOR LATERAL ARTHRODESIS LEVEL  2 N/A 09/11/2020   Procedure: Laminectomy and Foraminotomy - Lumbar Three-Lumbar Four, posterior lateral fusion Lumbar Three-Four and Lumbar Four- Five;  Surgeon: Eustace Moore, MD;  Location: [facility];  Service: Neurosurgery;  Laterality: N/A;  . POSTERIOR CERVICAL FUSION/FORAMINOTOMY N/A 09/23/2013   Procedure: CERVICALTWO TO CERVICAL SEVEN POSTERIOR CERVICAL FUSION/FORAMINOTOMY WITH LATERAL MASS FIXATION;  Surgeon: Eustace Moore, MD;  Location: [facility];  Service: Neurosurgery;  Laterality: N/A;  . Right Hand I&D  12/07   s/p rdue to abscess-Dr. Amedeo Plenty  . TOTAL HIP ARTHROPLASTY  12/17/2011   Procedure:  TOTAL HIP ARTHROPLASTY;  Surgeon: Johnny Bridge, MD;  Location: [facility];  Service: Orthopedics;  Laterality: Left;    reports that he has been smoking cigarettes. He has a 20.00 pack-year smoking history. He has never used smokeless tobacco. He reports previous drug use. Drug: "Crack" cocaine. He reports that he does not drink alcohol. family history includes Alcohol abuse in his brother, cousin, and father; Arthritis in his mother; Cancer in his brother and sister; Diabetes in his brother and sister; Goiter in his mother and sister; Heart disease in his father and another family member; Hyperlipidemia in his mother; Hypertension in his sister; Stroke in his father. Allergies  Allergen Reactions  . Acetaminophen Itching  . Tramadol Itching  . Morphine And Related Itching  . Oxycodone Itching   Current Outpatient Medications on File Prior to Visit  Medication Sig Dispense Refill  . adapalene (DIFFERIN) 0.1 % gel APPLY 1 APPLICATION TOPICALLY AT BEDTIME. 45 g 1  . aspirin EC 81 MG tablet Take 81 mg by mouth daily. Swallow whole.    [name] atorvastatin (LIPITOR) 40 MG tablet TAKE 1 TABLET BY MOUTH ONCE DAILY AT  6  PM (Patient taking differently: Take 40 mg by mouth daily.) 90 tablet 3  . bimatoprost (LUMIGAN) 0.01 % SOLN Place 1 drop into both eyes at bedtime.    . citalopram (CELEXA) 20 MG tablet Take 1 tablet (20 mg total) by mouth daily. 90 tablet 3  . clopidogrel (PLAVIX) 75 MG tablet Take 1 tablet (75 mg total) by mouth daily. 90 tablet 3  . diazepam (VALIUM) 10 MG tablet SMARTSIG:1 Tablet(s) By Mouth    . diphenhydrAMINE (BENADRYL) 25 mg capsule Take 50 mg by mouth every 6 (six) hours as needed for itching.     . famotidine (PEPCID) 40 MG tablet Take 1 tablet (40 mg total) by mouth 2 (two) times daily. Take every morning and at bedtime 60 tablet 3  . fluticasone (FLONASE) 50 MCG/ACT nasal spray USE 2 SPRAYS DAILY IN BOTH NOSTRILS (Patient taking differently: Place 2 sprays into both nostrils  daily.) 32 mL 0  . gabapentin (NEURONTIN) 100 MG capsule Take 200 mg by mouth 2 (two) times daily.  2  . hydrochlorothiazide (MICROZIDE) 12.5 MG capsule TAKE 1 CAPSULE BY MOUTH EVERY DAY 90 capsule 1  . imiquimod (ALDARA) 5 % cream APPLY 3 TIMES A WEEK (Patient taking differently: Apply 1 application topically 3 (three) times a week.) 24 each 0  . lidocaine (XYLOCAINE) 2 % solution TAKE AS DIRECTED (Patient taking differently: Use as directed 15 mLs in the mouth or throat daily as needed (acid reflux).) 450 mL 0  . Multiple Vitamins-Minerals (ONE DAILY FOR MEN/LYCOPENE) TABS     . nortriptyline (PAMELOR) 25 MG capsule Take 25 mg by mouth at bedtime.  0  . ondansetron (ZOFRAN ODT) 4 MG disintegrating tablet Take 1 tablet (4 mg total) by mouth  every 8 (eight) hours as needed for nausea or vomiting. 20 tablet 1  . oxybutynin (DITROPAN) 5 MG tablet Take 1 tablet (5 mg total) by mouth at bedtime. 90 tablet 3  . pantoprazole (PROTONIX) 40 MG tablet Take 1 tablet (40 mg total) by mouth daily. 90 tablet 3  . sildenafil (VIAGRA) 100 MG tablet Take 100 mg by mouth daily.    . sucralfate (CARAFATE) 1 g tablet Take 1 tablet (1 g total) by mouth 3 (three) times daily. Take 20 minutes after meals and at bedtime 120 tablet 1  . tamsulosin (FLOMAX) 0.4 MG CAPS capsule Take 1 capsule by mouth twice daily 180 capsule 3  . timolol (TIMOPTIC) 0.5 % ophthalmic solution Place 1 drop into both eyes daily.  6  . tiZANidine (ZANAFLEX) 4 MG tablet Take 4 mg by mouth every 8 (eight) hours as needed for muscle spasms.     [name] tretinoin (RETIN-A) 0.1 % cream APPLY  CREAM TOPICALLY TO AFFECTED AREA OF FACE AT BEDTIME (Patient taking differently: Apply 1 application topically at bedtime.) 40 g 1  . zolpidem (AMBIEN) 10 MG tablet Take 1 tablet (10 mg total) by mouth at bedtime as needed. for sleep 90 tablet 1   No current facility-administered medications on file prior to visit.        ROS:  All others reviewed and  negative.  Objective        PE:  BP 140/80   Pulse 86   Temp 98.6 F (37 C) (Oral)   Ht 6' (1.829 m)   Wt 177 lb (80.3 kg)   SpO2 97%   BMI 24.01 kg/m                 Constitutional: Pt appears in NAD               HENT: Head: NCAT.                Right Ear: External ear normal.                 Left Ear: External ear normal.                Eyes: . Pupils are equal, round, and reactive to light. Conjunctivae and EOM are normal               Nose: without d/c or deformity               Neck: Neck supple. Gross normal ROM               Cardiovascular: Normal rate and regular rhythm.                 Pulmonary/Chest: Effort normal and breath sounds without rales or wheezing.                Abd:  Soft, NT, ND, + BS, no organomegaly               Neurological: Pt is alert. At baseline orientation, motor grossly intact               LLE with large 2-3+ diffuse swelling below the knee wihtout skin change , has diffuse tender but worse at the post calf, and leg o/w neurovasc intact               Psychiatric: Pt behavior is normal without agitation   Micro: none  Cardiac tracings I have personally interpreted today:  none  Pertinent Radiological findings (summarize): none   Lab Results  Component Value Date   WBC 6.7 09/11/2020   HGB 11.9 (L) 09/11/2020   HCT 38.5 (L) 09/11/2020   PLT 315 09/11/2020   GLUCOSE 88 09/11/2020   CHOL 152 05/02/2020   TRIG 140 05/02/2020   HDL 66 05/02/2020   LDLDIRECT 100.0 09/02/2018   LDLCALC 64 05/02/2020   ALT 21 05/02/2020   AST 16 05/02/2020   NA 139 09/11/2020   K 4.1 09/11/2020   CL 106 09/11/2020   CREATININE 0.93 09/11/2020   BUN 6 09/11/2020   CO2 27 09/11/2020   TSH 1.33 11/05/2019   PSA 4.2 (H) 05/02/2020   INR 1.0 09/11/2020   HGBA1C 5.5 05/03/2019   Assessment/Plan:  DOSS CYBULSKI is a 60 y.o. Black or African American [2] male with  has a past medical history of Acute prostatitis (05/23/2009), Adjustment disorder with  depressed mood (09/2007), Cataract, CERVICAL RADICULOPATHY, LEFT (08/30/2008), CVA (cerebrovascular accident) ([place]), Depression, DJD (degenerative joint disease) of hip, DJD (degenerative joint disease) of knee, GERD (gastroesophageal reflux disease), GLAUCOMA ASSOCIATED WITH OCULAR DISORDER (08/30/2008), H/O: substance abuse ([place]), HLD (hyperlipidemia), HYPERTENSION (08/30/2008), HYPERTHYROIDISM (02/13/2010), HYPOTENSION (10/31/2009), Legally blind in left eye, as defined in [place], Other and unspecified hyperlipidemia (08/20/2013), Prostate cancer ([place]), Seasonal allergies, SPINAL STENOSIS, CERVICAL (08/08/2009), Stroke ([place]), and THYROID NODULE, RIGHT (03/02/2010).  Pain and swelling of left lower leg Here with unusual presentation, cant r/o DVT vs other, will need urgent LLE venous dopplers for which he may need eiliquis  Malignant neoplasm of prostate ([place]) Recent dx, to be considered for seed xrt per pt soon, may need to reconsider depending on need for anticoagulation  Essential hypertension BP Readings from Last 3 Encounters:  01/05/21 140/80  12/12/20 125/86  10/31/20 (!) 146/90   Stable, pt to continue medical treatment  - hct   Followup: Return if symptoms worsen or fail to improve.  Cathlean Cower, MD 01/06/2021 8:49 PM [facility]

## 2021-01-06 ENCOUNTER — Encounter: Payer: Self-pay | Admitting: Internal Medicine

## 2021-01-06 NOTE — Assessment & Plan Note (Signed)
Here with unusual presentation, cant r/o DVT vs other, will need urgent LLE venous dopplers for which he may need eiliquis

## 2021-01-06 NOTE — Assessment & Plan Note (Signed)
Recent dx, to be considered for seed xrt per pt soon, may need to reconsider depending on need for anticoagulation

## 2021-01-06 NOTE — Assessment & Plan Note (Signed)
BP Readings from Last 3 Encounters:  01/05/21 140/80  12/12/20 125/86  10/31/20 (!) 146/90   Stable, pt to continue medical treatment  - hct

## 2021-01-08 ENCOUNTER — Ambulatory Visit: Payer: Medicare Other | Admitting: Internal Medicine

## 2021-01-08 ENCOUNTER — Encounter: Payer: Self-pay | Admitting: Internal Medicine

## 2021-01-08 VITALS — BP 132/86 | HR 71 | Temp 98.0°F | Ht 72.0 in | Wt 175.0 lb

## 2021-01-08 DIAGNOSIS — I639 Cerebral infarction, unspecified: Secondary | ICD-10-CM | POA: Diagnosis not present

## 2021-01-08 DIAGNOSIS — S86112D Strain of other muscle(s) and tendon(s) of posterior muscle group at lower leg level, left leg, subsequent encounter: Secondary | ICD-10-CM | POA: Diagnosis not present

## 2021-01-08 DIAGNOSIS — S8012XD Contusion of left lower leg, subsequent encounter: Secondary | ICD-10-CM

## 2021-01-08 DIAGNOSIS — I10 Essential (primary) hypertension: Secondary | ICD-10-CM

## 2021-01-08 DIAGNOSIS — M79662 Pain in left lower leg: Secondary | ICD-10-CM

## 2021-01-08 DIAGNOSIS — M7989 Other specified soft tissue disorders: Secondary | ICD-10-CM

## 2021-01-08 NOTE — Patient Instructions (Signed)
You appear to have a muscle tear in the left calf with bleeding into the area of the tear, which was made worse because you were taking the plavix  For now, continue HOLDING the plavix until at least Monday January 15, 2021, then ok to restart, unless you are told anything different per Sports Medicine  You are given the Work Letter today to say you should be excused from work from Friday Jan 05, 2021 to a return to work date of January 15, 2021, but also the letter says you should have a light duty only (preferably sitting) for 6 weeks until a date of Feb 26, 2021  Please continue all other medications as before, including the pain medication, and continue as you are with leg elevation and rest  Please have the pharmacy call with any other refills you may need.  Please keep your appointments with your specialists as you may have planned  You will be contacted regarding the referral for: Sports Medicine this week  Please make an Appointment to return in 3 months, or sooner if needed

## 2021-01-08 NOTE — Progress Notes (Signed)
Patient ID: Mark Dunlap, male   DOB: 1961/06/18, 60 y.o.   MRN: [id number]        Chief Complaint: follow up left leg pain and swelling       HPI:  Mark Dunlap is a 60 y.o. male here to f/u - has persistent leg pain but improved with pain medication, and swelling overall at least 50% improved, Pt denies chest pain, increased sob or doe, wheezing, orthopnea, PND, increased LE swelling, palpitations, dizziness or syncope.   Pt denies polydipsia, polyuria, denies new neuro focal s/s.   Pt denies fever, wt loss, night sweats, loss of appetite, or other constitutional symptoms  N      Wt Readings from Last 3 Encounters:  01/11/21 172 lb (78 kg)  01/08/21 175 lb (79.4 kg)  01/05/21 177 lb (80.3 kg)   BP Readings from Last 3 Encounters:  01/11/21 120/78  01/08/21 132/86  01/05/21 140/80         Past Medical History:  Diagnosis Date  . Acute prostatitis 05/23/2009  . Adjustment disorder with depressed mood 09/2007  . Cataract    bilateral - MD just watching  . CERVICAL RADICULOPATHY, LEFT 08/30/2008  . CVA (cerebrovascular accident) ([place])   . Depression   . DJD (degenerative joint disease) of hip   . DJD (degenerative joint disease) of knee    left knee  . GERD (gastroesophageal reflux disease)   . 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 H/O: substance abuse ([place])    hx of ETOH/Crack cocaine-none since 12/2012 per pt/Does not Drive due to this  . HLD (hyperlipidemia)   . HYPERTENSION 08/30/2008  . HYPERTHYROIDISM 02/13/2010   pt was told by  Dr Jenny Reichmann  that thyroid was now back to normal .. 2012 ...   . HYPOTENSION 10/31/2009  . 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 Other and unspecified hyperlipidemia 08/20/2013  . Prostate cancer ([place])   . Seasonal allergies   . SPINAL STENOSIS, CERVICAL 08/08/2009  . Stroke ([place])   . THYROID NODULE, RIGHT 03/02/2010   Past Surgical  History:  Procedure Laterality Date  . ANTERIOR CERVICAL DECOMP/DISCECTOMY FUSION  10/11/2011   Procedure: ANTERIOR CERVICAL DECOMPRESSION/DISCECTOMY FUSION 3 LEVELS;  Surgeon: Eustace Moore;  Location: [facility];  Service: Neurosurgery;  Laterality: N/A;  Cervical three-four ,cervical four five cervical five six Anterior Cervical Decompression Fusion with peek + plate Nuvasive translational plate Orthofix peek (2 1/2 hours) Rm # [id number]  . ANTERIOR CERVICAL DECOMP/DISCECTOMY FUSION N/A 07/29/2014   Procedure: ANTERIOR CERVICAL DECOMPRESSION/DISCECTOMY FUSION 1 LEVEL/HARDWARE REMOVAL;  Surgeon: Eustace Moore, MD;  Location: [facility];  Service: Neurosurgery;  Laterality: N/A;  cervical six-seven  . BACK SURGERY  2019   [facility]  . COLONOSCOPY    . HARDWARE REMOVAL N/A 09/11/2020   Procedure: Removal of hardware LUmbar Four-Lumbar Five;  Surgeon: Eustace Moore, MD;  Location: [facility];  Service: Neurosurgery;  Laterality: N/A;  . LAMINECTOMY WITH POSTERIOR LATERAL ARTHRODESIS LEVEL 2 N/A 09/11/2020   Procedure: Laminectomy and Foraminotomy - Lumbar Three-Lumbar Four, posterior lateral fusion Lumbar Three-Four and Lumbar Four- Five;  Surgeon: Eustace Moore, MD;  Location: [facility];  Service: Neurosurgery;  Laterality: N/A;  . POSTERIOR CERVICAL FUSION/FORAMINOTOMY N/A 09/23/2013   Procedure: CERVICALTWO TO CERVICAL SEVEN POSTERIOR CERVICAL FUSION/FORAMINOTOMY WITH LATERAL MASS FIXATION;  Surgeon: Eustace Moore, MD;  Location: [facility];  Service: Neurosurgery;  Laterality: N/A;  . Right Hand I&D  12/07   s/p rdue to abscess-Dr. Amedeo Plenty  . TOTAL HIP ARTHROPLASTY  12/17/2011   Procedure: TOTAL HIP ARTHROPLASTY;  Surgeon: Johnny Bridge, MD;  Location: [facility];  Service: Orthopedics;  Laterality: Left;    reports that he has been smoking cigarettes. He has a 20.00 pack-year smoking history. He has never used smokeless tobacco. He reports previous drug use. Drug: "Crack" cocaine. He reports that he does  not drink alcohol. family history includes Alcohol abuse in his brother, cousin, and father; Arthritis in his mother; Cancer in his brother and sister; Diabetes in his brother and sister; Goiter in his mother and sister; Heart disease in his father and another family member; Hyperlipidemia in his mother; Hypertension in his sister; Stroke in his father. Allergies  Allergen Reactions  . Acetaminophen Itching  . Tramadol Itching  . Morphine And Related Itching  . Oxycodone Itching   Current Outpatient Medications on File Prior to Visit  Medication Sig Dispense Refill  . adapalene (DIFFERIN) 0.1 % gel APPLY 1 APPLICATION TOPICALLY AT BEDTIME. 45 g 1  . aspirin EC 81 MG tablet Take 81 mg by mouth daily. Swallow whole.    [name] atorvastatin (LIPITOR) 40 MG tablet TAKE 1 TABLET BY MOUTH ONCE DAILY AT  6  PM (Patient taking differently: Take 40 mg by mouth daily.) 90 tablet 3  . bimatoprost (LUMIGAN) 0.01 % SOLN Place 1 drop into both eyes at bedtime.    . citalopram (CELEXA) 20 MG tablet Take 1 tablet (20 mg total) by mouth daily. 90 tablet 3  . clopidogrel (PLAVIX) 75 MG tablet Take 1 tablet (75 mg total) by mouth daily. 90 tablet 3  . diazepam (VALIUM) 10 MG tablet SMARTSIG:1 Tablet(s) By Mouth    . diphenhydrAMINE (BENADRYL) 25 mg capsule Take 50 mg by mouth every 6 (six) hours as needed for itching.     . famotidine (PEPCID) 40 MG tablet Take 1 tablet (40 mg total) by mouth 2 (two) times daily. Take every morning and at bedtime 60 tablet 3  . fluticasone (FLONASE) 50 MCG/ACT nasal spray USE 2 SPRAYS DAILY IN BOTH NOSTRILS (Patient taking differently: Place 2 sprays into both nostrils daily.) 32 mL 0  . gabapentin (NEURONTIN) 100 MG capsule Take 200 mg by mouth 2 (two) times daily.  2  . hydrochlorothiazide (MICROZIDE) 12.5 MG capsule TAKE 1 CAPSULE BY MOUTH EVERY DAY 90 capsule 1  . HYDROcodone-acetaminophen (NORCO) 10-325 MG tablet Take 1 tablet by mouth every 6 (six) hours as needed for moderate  pain. 30 tablet 0  . imiquimod (ALDARA) 5 % cream APPLY 3 TIMES A WEEK (Patient taking differently: Apply 1 application topically 3 (three) times a week.) 24 each 0  . lidocaine (XYLOCAINE) 2 % solution TAKE AS DIRECTED (Patient taking differently: Use as directed 15 mLs in the mouth or throat daily as needed (acid reflux).) 450 mL 0  . Multiple Vitamins-Minerals (ONE DAILY FOR MEN/LYCOPENE) TABS     . nortriptyline (PAMELOR) 25 MG capsule Take 25 mg by mouth at bedtime.  0  . ondansetron (ZOFRAN ODT) 4 MG disintegrating tablet Take 1 tablet (4 mg total) by mouth every 8 (eight) hours as needed for nausea or vomiting. 20 tablet 1  . oxybutynin (DITROPAN) 5 MG tablet Take 1 tablet (5 mg total) by mouth at bedtime. 90 tablet 3  . pantoprazole (  PROTONIX) 40 MG tablet Take 1 tablet (40 mg total) by mouth daily. 90 tablet 3  . sildenafil (VIAGRA) 100 MG tablet Take 100 mg by mouth daily.    . sucralfate (CARAFATE) 1 g tablet Take 1 tablet (1 g total) by mouth 3 (three) times daily. Take 20 minutes after meals and at bedtime 120 tablet 1  . tamsulosin (FLOMAX) 0.4 MG CAPS capsule Take 1 capsule by mouth twice daily 180 capsule 3  . timolol (TIMOPTIC) 0.5 % ophthalmic solution Place 1 drop into both eyes daily.  6  . tiZANidine (ZANAFLEX) 4 MG tablet Take 4 mg by mouth every 8 (eight) hours as needed for muscle spasms.     [name] tretinoin (RETIN-A) 0.1 % cream APPLY  CREAM TOPICALLY TO AFFECTED AREA OF FACE AT BEDTIME (Patient taking differently: Apply 1 application topically at bedtime.) 40 g 1  . zolpidem (AMBIEN) 10 MG tablet Take 1 tablet (10 mg total) by mouth at bedtime as needed. for sleep 90 tablet 1   No current facility-administered medications on file prior to visit.        ROS:  All others reviewed and negative.  Objective        PE:  BP 132/86   Pulse 71   Temp 98 F (36.7 C) (Oral)   Ht 6' (1.829 m)   Wt 175 lb (79.4 kg)   SpO2 95%   BMI 23.73 kg/m                 Constitutional:  Pt appears in NAD               HENT: Head: NCAT.                Right Ear: External ear normal.                 Left Ear: External ear normal.                Eyes: . Pupils are equal, round, and reactive to light. Conjunctivae and EOM are normal               Nose: without d/c or deformity               Neck: Neck supple. Gross normal ROM               Cardiovascular: Normal rate and regular rhythm.                 Pulmonary/Chest: Effort normal and breath sounds without rales or wheezing.                Abd:  Soft, NT, ND, + BS, no organomegaly               Neurological: Pt is alert. At baseline orientation, motor grossly intact               LLE with trace to 1+ distal LE swelling and tender calf o/w neruovasc intact               Psychiatric: Pt behavior is normal without agitation   Micro: none  Cardiac tracings I have personally interpreted today:  none  Pertinent Radiological findings (summarize): venous doppler LLE Jan 05, 2021 Summary: RIGHT: - No evidence of common femoral vein obstruction.   LEFT: - No evidence of common femoral vein obstruction. - No cystic structure found in the popliteal fossa. - Ultrasound characteristics of enlarged lymph nodes  noted in the groin and superficial edema in calf, see notes above. - Large complex collection in proximal calf, see notes and measurements above.   Lab Results  Component Value Date   WBC 6.7 09/11/2020   HGB 11.9 (L) 09/11/2020   HCT 38.5 (L) 09/11/2020   PLT 315 09/11/2020   GLUCOSE 88 09/11/2020   CHOL 152 05/02/2020   TRIG 140 05/02/2020   HDL 66 05/02/2020   LDLDIRECT 100.0 09/02/2018   LDLCALC 64 05/02/2020   ALT 21 05/02/2020   AST 16 05/02/2020   NA 139 09/11/2020   K 4.1 09/11/2020   CL 106 09/11/2020   CREATININE 0.93 09/11/2020   BUN 6 09/11/2020   CO2 27 09/11/2020   TSH 1.33 11/05/2019   PSA 4.2 (H) 05/02/2020   INR 1.0 09/11/2020   HGBA1C 5.5 05/03/2019   Assessment/Plan:  Mark Dunlap  is a 60 y.o. Black or African American [2] male with  has a past medical history of Acute prostatitis (05/23/2009), Adjustment disorder with depressed mood (09/2007), Cataract, CERVICAL RADICULOPATHY, LEFT (08/30/2008), CVA (cerebrovascular accident) ([place]), Depression, DJD (degenerative joint disease) of hip, DJD (degenerative joint disease) of knee, GERD (gastroesophageal reflux disease), GLAUCOMA ASSOCIATED WITH OCULAR DISORDER (08/30/2008), H/O: substance abuse ([place]), HLD (hyperlipidemia), HYPERTENSION (08/30/2008), HYPERTHYROIDISM (02/13/2010), HYPOTENSION (10/31/2009), Legally blind in left eye, as defined in [place], Other and unspecified hyperlipidemia (08/20/2013), Prostate cancer ([place]), Seasonal allergies, SPINAL STENOSIS, CERVICAL (08/08/2009), Stroke ([place]), and THYROID NODULE, RIGHT (03/02/2010).  Pain and swelling of left lower leg You appear to have a muscle tear in the left calf with bleeding into the area of the tear, which was made worse because you were taking the plavix  For now, continue HOLDING the plavix until at least Monday January 15, 2021, then ok to restart, unless you are told anything different per Sports Medicine  You are given the Work Letter today to say you should be excused from work from Friday Jan 05, 2021 to a return to work date of January 15, 2021, but also the letter says you should have a light duty only (preferably sitting) for 6 weeks until a date of Feb 26, 2021  Please continue all other medications as before, including the pain medication, and continue as you are with leg elevation and rest  F/u sports med as referred  CVA (cerebral vascular accident) ([place]) Stable, to hold plavix to apr 4  Essential hypertension BP Readings from Last 3 Encounters:  01/11/21 120/78  01/08/21 132/86  01/05/21 140/80   Stable, pt to continue medical treatment  - hct   Followup: Return in about 3 months (around 04/10/2021).  Cathlean Cower, MD 01/17/2021 10:15 PM [facility]

## 2021-01-09 ENCOUNTER — Other Ambulatory Visit: Payer: Self-pay | Admitting: Urology

## 2021-01-09 DIAGNOSIS — C61 Malignant neoplasm of prostate: Secondary | ICD-10-CM

## 2021-01-10 ENCOUNTER — Telehealth: Payer: Self-pay | Admitting: *Deleted

## 2021-01-10 NOTE — Telephone Encounter (Signed)
CALLED PATIENT TO INFORM OF PRE-SEED APPTS. FOR 02-15-21 AND HIS IMPLANT FOR - 03-23-21, SPOKE WITH PATIENT AND HE IS AWARE OF THESE APPTS.

## 2021-01-11 ENCOUNTER — Ambulatory Visit: Payer: Medicare Other | Admitting: Family Medicine

## 2021-01-11 ENCOUNTER — Other Ambulatory Visit: Payer: Self-pay

## 2021-01-11 ENCOUNTER — Encounter: Payer: Self-pay | Admitting: Family Medicine

## 2021-01-11 ENCOUNTER — Ambulatory Visit: Payer: Self-pay

## 2021-01-11 VITALS — BP 120/78 | HR 75 | Ht 72.0 in | Wt 172.0 lb

## 2021-01-11 DIAGNOSIS — C61 Malignant neoplasm of prostate: Secondary | ICD-10-CM | POA: Diagnosis not present

## 2021-01-11 DIAGNOSIS — M79662 Pain in left lower leg: Secondary | ICD-10-CM

## 2021-01-11 DIAGNOSIS — M7989 Other specified soft tissue disorders: Secondary | ICD-10-CM | POA: Diagnosis not present

## 2021-01-11 DIAGNOSIS — R599 Enlarged lymph nodes, unspecified: Secondary | ICD-10-CM

## 2021-01-11 NOTE — Patient Instructions (Addendum)
Thank you for coming in today.  I will make sure Dr Tammi Klippel and Dr Jeffie Pollock know about the Lymph node.   I recommend you obtained a compression sleeve to help with your joint problems. There are many options on the market however I recommend obtaining a calf Body Helix compression sleeve.  You can find information (including how to appropriate measure yourself for sizing) can be found at www.Body http://www.lambert.com/.  Many of these products are health savings account (HSA) eligible.   You can use the compression sleeve at any time throughout the day but is most important to use while being active as well as for 2 hours post-activity.   It is appropriate to ice following activity with the compression sleeve in place.   Please perform the exercise program that we have prepared for you and gone over in detail on a daily basis.  In addition to the handout you were provided you can access your program through: [email or web address]-exercise-code.com   Your unique program code is: [phone]

## 2021-01-11 NOTE — Progress Notes (Signed)
Subjective:    CC: L gastroc strain  I, Molly Weber, LAT, ATC, am serving as scribe for Dr. Lynne Leader.  HPI: Pt is a 60 y/o male presenting w/ c/o L calf pain and swelling x approximately 2 weeks w/ no known MOI.  He locates his pain to his L medial calf from his L post-med knee to his L lower calf.  He has been seen by his PCP x 2 regarding this condition and had a L LE venous doppler US indicating no DVT but likely gastroc tear.  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  Radiating pain: yes in his L post calf L calf swelling: yes Aggravating factors: walking; attempting to fully extend his L knee and end-range L ankle DF Treatments tried: ice; hemp cream; hydrocodone-acetaminophen  Diagnostic testing: L LE venous doppler US- 01/05/21  Pertinent review of Systems: No fevers or chills  Relevant historical information: Prostate cancer currently in the process of being treated   Objective:    Vitals:   01/11/21 0855  BP: 120/78  Pulse: 75  SpO2: 96%   General: Well Developed, well nourished, and in no acute distress.   MSK: Left leg slight swelling.  Tender palpation medial gastrocnemius.  Pain with resisted plantarflexion left ankle Pulses capillary fill and sensation are intact distally.  Lab and Radiology Results  Diagnostic Limited MSK Ultrasound of: Left calf Hypoechoic change at muscle tendinous junction left gastrocnemius head consistent with small tear and hematoma now resolving.  Tenderness in this area with pressure with ultrasound probe. Some hypoechoic change within subcutaneous tissue tracking lower leg consistent with edema. Impression: Medial head gastrocnemius small tear.   Relevant section from vascular ultrasound dated March 25  Left groin lymph nodes measuring 2.2 x .8 1.0 cm and 1.5 x .5 x .6 cm.  Superficial edema noted in the mid/distal medial calf. Large complex  appearing collection  adjacent to the posteromedial proximal calf muscle  measuring approximately 6.9 x 2.0 x 2.2  cm. Suggestion of possible muscle tear with hematoma.    Impression and Recommendations:    Assessment and Plan: 60 y.o. male with left calf strain/gastrocnemius tear.  Plan to treat with eccentric exercises and compression sleeve.  Recheck back in about 1 month.  However patient had a vascular ultrasound on March 25 that incidentally identified an enlarged lymph node in the groin measuring 2.2 cm.  This is probably reactive to the leg swelling however he has prostate cancer is currently being treated for prostate cancer.  I have sent a letter to his urologist and radiation oncologist and his primary care provider to make them aware of this incidental finding.  I would expect that this lymph node probably deserves a little more evaluation and follow-up it may be even a biopsy.  Discussed this with the patient who expresses understanding and agreement.[name]    PDMP not reviewed this encounter. Orders Placed This Encounter  Procedures  . [place] LIMITED JOINT SPACE STRUCTURES LOW LEFT(NO LINKED CHARGES)    Order Specific Question:   Reason for Exam (SYMPTOM  OR DIAGNOSIS REQUIRED)    Answer:   L calf pain    Order Specific Question:   Preferred imaging location?    Answer:   [facility]   No orders of the defined types were placed in this encounter.   Discussed warning signs or symptoms. Please see discharge instructions.  Patient expresses understanding.   The above documentation has been reviewed and is accurate and complete Lynne Leader, M.D.

## 2021-01-15 ENCOUNTER — Telehealth: Payer: Self-pay | Admitting: *Deleted

## 2021-01-15 NOTE — Telephone Encounter (Signed)
CALLED PATIENT TO ASK ABOUT COMING IN TOMORROW FOR PRE-SEED APPTS. PATIENT AGREED TO NEW DATE AND TIME

## 2021-01-16 ENCOUNTER — Other Ambulatory Visit: Payer: Self-pay

## 2021-01-16 ENCOUNTER — Ambulatory Visit: Payer: Medicare Other | Admitting: Urology

## 2021-01-16 ENCOUNTER — Ambulatory Visit (HOSPITAL_COMMUNITY)
Admission: RE | Admit: 2021-01-16 | Discharge: 2021-01-16 | Disposition: A | Discharge status: Home / Self Care | Payer: Medicare Other | Source: Ambulatory Visit | Attending: Urology | Admitting: Urology

## 2021-01-16 ENCOUNTER — Other Ambulatory Visit: Payer: Self-pay | Admitting: Urology

## 2021-01-16 ENCOUNTER — Ambulatory Visit
Admission: RE | Admit: 2021-01-16 | Discharge: 2021-01-16 | Disposition: A | Discharge status: Home / Self Care | Payer: Medicare Other | Source: Ambulatory Visit | Attending: Radiation Oncology | Admitting: Radiation Oncology

## 2021-01-16 ENCOUNTER — Encounter (HOSPITAL_COMMUNITY)
Admission: RE | Admit: 2021-01-16 | Discharge: 2021-01-16 | Disposition: A | Discharge status: Home / Self Care | Payer: Medicare Other | Source: Ambulatory Visit | Attending: Urology | Admitting: Urology

## 2021-01-16 ENCOUNTER — Encounter: Payer: Self-pay | Admitting: Urology

## 2021-01-16 DIAGNOSIS — C61 Malignant neoplasm of prostate: Secondary | ICD-10-CM

## 2021-01-16 NOTE — Progress Notes (Signed)
Mark Dunlap had a left calf tear and and as part of his evaluation he had a vascular US that demonstrated a couple of left inguinal nodes up to 2.2cm. He then saw Dr. Georgina Snell who is a sports medicine Doc and he noted that the nodes were probably reactive, but in the face of the prostate cancer might need further evaluation.  Mark Dunlap is confused and a little upset by this understandably. He had no adenopathy on the MRI in early January and the location of the nodes is not a typical landing site for prostate cancer.  His treatment planning CT for the brachytherapy procedure was expedited, performed today, so that we could assess the area of concern and hopefully reassure Mark Dunlap. Unfortunately, on this scan, there are bilateral enlarged lymph nodes that warrant further evaluation. The recommendation is to proceed with a dedicated CT Pelvis to determine whether these appear more reactive or if there is a need for CT guided needle biopsy of a node for confirmation. This has been discussed with the patient and he is in agreement with the recommended evaluation.  I have placed the order for CT Pelvis and we will work to get this scheduled just as soon as absolutely possible and I will plan to follow up with the patient by phone to review results and next steps. This discussion has been shared with Dr. Jeffie Pollock and I will plan to keep him in the loop as we move forward.  [name], MMS, PA-C [facility]: [phone]  Fax: [phone]

## 2021-01-16 NOTE — Progress Notes (Signed)
  Radiation Oncology         [phone] ________________________________  Name: Mark Dunlap MRN: [id number]  Date: 01/16/2021  DOB: 10/06/1961  SIMULATION AND TREATMENT PLANNING NOTE PUBIC ARCH STUDY  WC:HENI, Hunt Oris, MD  Biagio Borg, MD  DIAGNOSIS: 60 y.o. gentleman with Stage T1c adenocarcinoma of the prostate with Gleason score of 4+3, and PSA of 5.09.   Oncology History  Malignant neoplasm of prostate ([place])  11/21/2020 Cancer Staging   Staging form: Prostate, AJCC 8th Edition - Clinical stage from 11/21/2020: Stage IIC (cT1c, cN0, cM0, PSA: 5.1, Grade Group: 3) - Signed by Freeman Caldron, PA-C on 12/12/2020 Histopathologic type: Adenocarcinoma, NOS Prostate specific antigen (PSA) range: Less than 10 Gleason primary pattern: 4 Gleason secondary pattern: 3 Gleason score: 7 Histologic grading system: 5 grade system Number of biopsy cores examined: 12 Number of biopsy cores positive: 5 Location of positive needle core biopsies: One side   12/12/2020 Initial Diagnosis   Malignant neoplasm of prostate ([place])       ICD-10-CM   1. Malignant neoplasm of prostate ([place])  C61     COMPLEX SIMULATION:  The patient presented today for evaluation for possible prostate seed implant. He was brought to the radiation planning suite and placed supine on the CT couch. A 3-dimensional image study set was obtained in upload to the planning computer. There, on each axial slice, I contoured the prostate gland. Then, using three-dimensional radiation planning tools I reconstructed the prostate in view of the structures from the transperineal needle pathway to assess for possible pubic arch interference. In doing so, I did not appreciate any pubic arch interference. Also, the patient's prostate volume was estimated based on the drawn structure. The volume was 27 cc.  Given the pubic arch appearance and prostate volume, patient remains a good candidate to proceed with prostate seed implant. Today, he  freely provided informed written consent to proceed.    Recently, the patient was found to have enlarged inguinal lymph nodes of uncertain significance.  So, in looking in that area today, we were able to identify multiple bilateral enlarged inguinal nodes.  The location would not be a common drainage pattern from the prostate.  They may represent reactive adenopathy, versus prostate mets versus another process.     PLAN:  We will further evaluate the lymph nodes with diagnostic abdomen/pelvic CT.  Depending on those findings, we may pursue further evaluation.  If these nodes are not related to his prostate cancer, the patient will undergo prostate seed implant.   ________________________________  Sheral Apley. Tammi Klippel, M.D.    This document serves as a record of services personally performed by Tyler Pita, MD. It was created on his behalf by Wilburn Mylar, a trained medical scribe. The creation of this record is based on the scribe's personal observations and the provider's statements to them. This document has been checked and approved by the attending provider.

## 2021-01-17 ENCOUNTER — Encounter: Payer: Self-pay | Admitting: Internal Medicine

## 2021-01-17 ENCOUNTER — Telehealth: Payer: Self-pay | Admitting: *Deleted

## 2021-01-17 NOTE — Telephone Encounter (Signed)
CALLED PATIENT TO INFORM OF CT FOR 01-23-21- ARRIVAL TIME- 12:15 PM @ [facility], PATIENT TO HAVE WATER ONLY - 4 HRS. PRIOR TO TEST, SPOKE WITH PATIENT AND HE IS AWARE OF THIS TEST

## 2021-01-17 NOTE — Assessment & Plan Note (Signed)
You appear to have a muscle tear in the left calf with bleeding into the area of the tear, which was made worse because you were taking the plavix  For now, continue HOLDING the plavix until at least Monday January 15, 2021, then ok to restart, unless you are told anything different per Sports Medicine  You are given the Work Letter today to say you should be excused from work from Friday Jan 05, 2021 to a return to work date of January 15, 2021, but also the letter says you should have a light duty only (preferably sitting) for 6 weeks until a date of Feb 26, 2021  Please continue all other medications as before, including the pain medication, and continue as you are with leg elevation and rest  F/u sports med as referred

## 2021-01-17 NOTE — Assessment & Plan Note (Signed)
Stable, to hold plavix to apr 4

## 2021-01-17 NOTE — Assessment & Plan Note (Signed)
BP Readings from Last 3 Encounters:  01/11/21 120/78  01/08/21 132/86  01/05/21 140/80   Stable, pt to continue medical treatment  - hct

## 2021-01-19 ENCOUNTER — Ambulatory Visit: Payer: Medicare Other | Admitting: Podiatry

## 2021-01-21 ENCOUNTER — Other Ambulatory Visit: Payer: Self-pay | Admitting: Internal Medicine

## 2021-01-22 ENCOUNTER — Ambulatory Visit (HOSPITAL_COMMUNITY): Admission: RE | Admit: 2021-01-22 | Payer: Medicare Other | Source: Ambulatory Visit

## 2021-01-22 DIAGNOSIS — M7070 Other bursitis of hip, unspecified hip: Secondary | ICD-10-CM | POA: Insufficient documentation

## 2021-01-23 ENCOUNTER — Other Ambulatory Visit: Payer: Self-pay | Admitting: Radiation Oncology

## 2021-01-23 ENCOUNTER — Ambulatory Visit (HOSPITAL_COMMUNITY)
Admission: RE | Admit: 2021-01-23 | Discharge: 2021-01-23 | Disposition: A | Discharge status: Home / Self Care | Payer: Medicare Other | Source: Ambulatory Visit | Attending: Urology | Admitting: Urology

## 2021-01-23 ENCOUNTER — Other Ambulatory Visit: Payer: Self-pay

## 2021-01-23 DIAGNOSIS — C61 Malignant neoplasm of prostate: Secondary | ICD-10-CM | POA: Insufficient documentation

## 2021-01-23 LAB — POCT I-STAT CREATININE: Creatinine, Ser: 0.9 mg/dL (ref 0.61–1.24)

## 2021-01-23 MED ORDER — IOHEXOL 300 MG/ML  SOLN
100.0000 mL | Freq: Once | INTRAMUSCULAR | Status: AC | PRN
  Administered 2021-01-23: 100 mL via INTRAVENOUS

## 2021-01-24 ENCOUNTER — Telehealth: Payer: Self-pay | Admitting: Internal Medicine

## 2021-01-24 ENCOUNTER — Ambulatory Visit: Payer: Medicare Other | Admitting: Podiatry

## 2021-01-24 DIAGNOSIS — M19071 Primary osteoarthritis, right ankle and foot: Secondary | ICD-10-CM | POA: Diagnosis not present

## 2021-01-24 DIAGNOSIS — M19072 Primary osteoarthritis, left ankle and foot: Secondary | ICD-10-CM

## 2021-01-24 NOTE — Telephone Encounter (Signed)
Pam from [facility] urology has called requesting surgical clearance for the patient to have a radioactive seed implant done on June 10th, 2022  The patient was seen in office on 3.28.22, would he need another appointment prior to?   Pam will be faxing the form over to our office for either the provider to fill out or for his appt.   Pam- [phone] ext. [place]

## 2021-01-25 ENCOUNTER — Telehealth: Payer: Self-pay | Admitting: Radiation Oncology

## 2021-01-25 ENCOUNTER — Encounter: Payer: Self-pay | Admitting: Podiatry

## 2021-01-25 NOTE — Telephone Encounter (Signed)
I called the patient to let him know that his CT scan did not reveal concerns for adenopathy in the inguinal region and Dr. Tammi Klippel also reviewed his films and felt that the nodes were smaller than previously seen and that we could move forward with his seed implant as scheduled in June 2022. He is in agreement and will call us with questions or concerns.    Carola Rhine, PAC

## 2021-01-25 NOTE — Telephone Encounter (Signed)
Form is signed to be faxed back - done hardcopy to [facility]

## 2021-01-25 NOTE — Progress Notes (Signed)
Subjective:  Patient ID: Mark Dunlap, male    DOB: 02-24-1961,  MRN: [id number]  Chief Complaint  Patient presents with  . Foot Pain    PT stated that he feels like the injections are helping     60 y.o. male presents with the above complaint.  Patient is here for follow-up of bilateral first metatarsophalangeal joint pain with severe arthrosis.  He is due for his 70-month steroid injection as it does continue to give him relief for many months.  I will plan on doing surgery once he is ready for it.  Or if the steroid shot stops working   Review of Systems: Negative except as noted in the HPI. Denies N/V/F/Ch.  Past Medical History:  Diagnosis Date  . Acute prostatitis 05/23/2009  . Adjustment disorder with depressed mood 09/2007  . Cataract    bilateral - MD just watching  . CERVICAL RADICULOPATHY, LEFT 08/30/2008  . CVA (cerebrovascular accident) ([place])   . Depression   . DJD (degenerative joint disease) of hip   . DJD (degenerative joint disease) of knee    left knee  . GERD (gastroesophageal reflux disease)   . 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 H/O: substance abuse ([place])    hx of ETOH/Crack cocaine-none since 12/2012 per pt/Does not Drive due to this  . HLD (hyperlipidemia)   . HYPERTENSION 08/30/2008  . HYPERTHYROIDISM 02/13/2010   pt was told by  Dr Jenny Reichmann  that thyroid was now back to normal .. 2012 ...   . HYPOTENSION 10/31/2009  . 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 Other and unspecified hyperlipidemia 08/20/2013  . Prostate cancer ([place])   . Seasonal allergies   . SPINAL STENOSIS, CERVICAL 08/08/2009  . Stroke ([place])   . THYROID NODULE, RIGHT 03/02/2010    Current Outpatient Medications:  .  adapalene (DIFFERIN) 0.1 % gel, APPLY 1 APPLICATION TOPICALLY AT BEDTIME., Disp: 45 g, Rfl: 1 .  aspirin EC 81 MG tablet, Take 81 mg by mouth daily. Swallow whole.,  Disp: , Rfl:  .  atorvastatin (LIPITOR) 40 MG tablet, TAKE 1 TABLET BY MOUTH ONCE DAILY AT  6  PM (Patient taking differently: Take 40 mg by mouth daily.), Disp: 90 tablet, Rfl: 3 .  bimatoprost (LUMIGAN) 0.01 % SOLN, Place 1 drop into both eyes at bedtime., Disp: , Rfl:  .  citalopram (CELEXA) 20 MG tablet, Take 1 tablet (20 mg total) by mouth daily., Disp: 90 tablet, Rfl: 3 .  clopidogrel (PLAVIX) 75 MG tablet, Take 1 tablet (75 mg total) by mouth daily., Disp: 90 tablet, Rfl: 3 .  diazepam (VALIUM) 10 MG tablet, SMARTSIG:1 Tablet(s) By Mouth, Disp: , Rfl:  .  diphenhydrAMINE (BENADRYL) 25 mg capsule, Take 50 mg by mouth every 6 (six) hours as needed for itching. , Disp: , Rfl:  .  famotidine (PEPCID) 40 MG tablet, Take 1 tablet (40 mg total) by mouth 2 (two) times daily. Take every morning and at bedtime, Disp: 60 tablet, Rfl: 3 .  fluticasone (FLONASE) 50 MCG/ACT nasal spray, USE 2 SPRAYS DAILY IN BOTH NOSTRILS (Patient taking differently: Place 2 sprays into both nostrils daily.), Disp: 32 mL, Rfl: 0 .  gabapentin (NEURONTIN) 100 MG capsule, Take 200 mg by mouth 2 (two) times daily., Disp: , Rfl: 2 .  hydrochlorothiazide (MICROZIDE) 12.5 MG capsule, TAKE 1  CAPSULE BY MOUTH EVERY DAY, Disp: 90 capsule, Rfl: 1 .  HYDROcodone-acetaminophen (NORCO) 10-325 MG tablet, Take 1 tablet by mouth every 6 (six) hours as needed for moderate pain., Disp: 30 tablet, Rfl: 0 .  imiquimod (ALDARA) 5 % cream, APPLY 3 TIMES A WEEK (Patient taking differently: Apply 1 application topically 3 (three) times a week.), Disp: 24 each, Rfl: 0 .  lidocaine (XYLOCAINE) 2 % solution, Use as directed 15 mLs in the mouth or throat daily as needed (acid reflux)., Disp: 100 mL, Rfl: 2 .  Multiple Vitamins-Minerals (ONE DAILY FOR MEN/LYCOPENE) TABS, , Disp: , Rfl:  .  nortriptyline (PAMELOR) 25 MG capsule, Take 25 mg by mouth at bedtime., Disp: , Rfl: 0 .  ondansetron (ZOFRAN ODT) 4 MG disintegrating tablet, Take 1 tablet (4 mg  total) by mouth every 8 (eight) hours as needed for nausea or vomiting., Disp: 20 tablet, Rfl: 1 .  oxybutynin (DITROPAN) 5 MG tablet, TAKE 1 TABLET BY MOUTH AT BEDTIME, Disp: 90 tablet, Rfl: 0 .  pantoprazole (PROTONIX) 40 MG tablet, Take 1 tablet (40 mg total) by mouth daily., Disp: 90 tablet, Rfl: 3 .  sildenafil (VIAGRA) 100 MG tablet, Take 100 mg by mouth daily., Disp: , Rfl:  .  sucralfate (CARAFATE) 1 g tablet, Take 1 tablet (1 g total) by mouth 3 (three) times daily. Take 20 minutes after meals and at bedtime, Disp: 120 tablet, Rfl: 1 .  tamsulosin (FLOMAX) 0.4 MG CAPS capsule, Take 1 capsule by mouth twice daily, Disp: 180 capsule, Rfl: 3 .  timolol (TIMOPTIC) 0.5 % ophthalmic solution, Place 1 drop into both eyes daily., Disp: , Rfl: 6 .  tiZANidine (ZANAFLEX) 4 MG tablet, Take 4 mg by mouth every 8 (eight) hours as needed for muscle spasms. , Disp: , Rfl:  .  tretinoin (RETIN-A) 0.1 % cream, APPLY  CREAM TOPICALLY TO AFFECTED AREA OF FACE AT BEDTIME (Patient taking differently: Apply 1 application topically at bedtime.), Disp: 40 g, Rfl: 1 .  zolpidem (AMBIEN) 10 MG tablet, Take 1 tablet (10 mg total) by mouth at bedtime as needed. for sleep, Disp: 90 tablet, Rfl: 1  Social History   Tobacco Use  Smoking Status Current Every Day Smoker  . Packs/day: 0.50  . Years: 40.00  . Pack years: 20.00  . Types: Cigarettes  Smokeless Tobacco Never Used    Allergies  Allergen Reactions  . Acetaminophen Itching  . Tramadol Itching  . Morphine And Related Itching  . Oxycodone Itching   Objective:   There were no vitals filed for this visit. There is no height or weight on file to calculate BMI. Constitutional Well developed. Well nourished.  Vascular Dorsalis pedis pulses palpable bilaterally. Posterior tibial pulses palpable bilaterally. Capillary refill normal to all digits.  No cyanosis or clubbing noted. Pedal hair growth normal.  Neurologic Normal speech. Oriented to person,  place, and time. Epicritic sensation to light touch grossly present bilaterally.  Dermatologic Nails well groomed and normal in appearance. No open wounds. No skin lesions.  Orthopedic:  Limited range of motion noted of bilateral first metatarsophalangeal joint with left more painful than right.  Intra-articular pain noted bilaterally.  Pain with end range of motion.  Hallux limitus noted bilaterally.   Radiographs: 2 views of bilaterally skeletally mature adult foot: Severe arthritic changes noted to the right side.  Uneven joint space narrowing noted to the left side.  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  No other bony abnormalities identified mild arthritic changes noted to the dorsal aspect of the midfoot.  Mild elevatus present bilaterally. Assessment:   1. Arthritis of first metatarsophalangeal (MTP) joint of left foot   2. Arthritis of first metatarsophalangeal (MTP) joint of right foot    Plan:  Patient was evaluated and treated and all questions answered.  Bilateral first metatarsophalangeal joint arthritis with left more painful than right -Given clinically patient is improving and the pain is decreasing with steroid injection I believe he will benefit from another steroid injection to bilateral first metatarsophalangeal joint to help him get to closer to 95% or greater in terms of pain relief.  Patient states understanding would like to proceed with a injection to bilateral first metatarsophalangeal joint.  However at this time I am worried that his pain is not getting resolved and he is needing more injection.  I briefly discussed with him that he will need surgical intervention if this injection does not help.  For now patient states injections do help and once he gets his social situation under control he will proceed with surgery.  Patient states understanding -A steroid injection was performed at  bilateral first metatarsophalangeal joint using 1% plain Lidocaine and 10 mg of Kenalog. This was well tolerated. -I will also discussed with him orthotics management with Morton's extension take some of the stress off of the first metatarsophalangeal joint. -I will continue doing bilateral steroid injection as long as it is controlling his pain.  I will continue to do steroid injection until patient is ready for surgery about every 12 weeks No follow-ups on file.

## 2021-01-29 NOTE — Telephone Encounter (Signed)
Fax completed.

## 2021-02-07 NOTE — Progress Notes (Deleted)
   I, Peterson Lombard, LAT, ATC acting as a scribe for Lynne Leader, MD.  Mark Dunlap is a 60 y.o. male who presents to [place] [facility] today for f/u L calf pain ongoing since mid-March. 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 Pt was last seen by Dr. Georgina Snell on 01/11/21 and was advised to treat w/ eccentric exercises and compression sleeve. Additionally, a letter was sent to pt's urologist, radiation oncologist, and PCP to make them aware of an incidental finding from the vascular US of an enlarged lymph node in groin measuring 2.2cm. Today, pt reports   Diagnostic testing: 01/05/21 L LE venous doppler US  Pertinent review of systems: ***  Relevant historical information: ***   Exam:  There were no vitals taken for this visit. General: Well Developed, well nourished, and in no acute distress.   MSK: ***    Lab and Radiology Results No results found for this or any previous visit (from the past 72 hour(s)). No results found.     Assessment and Plan: 60 y.o. male with ***   PDMP not reviewed this encounter. No orders of the defined types were placed in this encounter.  No orders of the defined types were placed in this encounter.    Discussed warning signs or symptoms. Please see discharge instructions. Patient expresses understanding.   ***

## 2021-02-08 ENCOUNTER — Ambulatory Visit: Payer: Medicare Other | Admitting: Family Medicine

## 2021-02-08 ENCOUNTER — Ambulatory Visit: Payer: Medicare Other | Admitting: Radiation Oncology

## 2021-02-15 ENCOUNTER — Other Ambulatory Visit (HOSPITAL_COMMUNITY): Payer: Medicare Other

## 2021-02-15 ENCOUNTER — Ambulatory Visit: Payer: Medicare Other | Admitting: Radiation Oncology

## 2021-02-15 ENCOUNTER — Ambulatory Visit: Payer: Self-pay | Admitting: Urology

## 2021-02-15 DIAGNOSIS — M4316 Spondylolisthesis, lumbar region: Secondary | ICD-10-CM | POA: Insufficient documentation

## 2021-02-27 ENCOUNTER — Telehealth: Payer: Self-pay | Admitting: Internal Medicine

## 2021-02-27 NOTE — Telephone Encounter (Signed)
lidocaine (XYLOCAINE) 2 % solution [facility]), [place] [facility] [place] Phone:  [phone]  Fax:  [phone]     Last seen- 03.28.22 Next apt- n/a

## 2021-02-28 NOTE — Telephone Encounter (Signed)
As this is not a medication that is normally refilled but is usually for limited use for a short time only,  I would need to hold on refill at this time

## 2021-03-01 NOTE — Telephone Encounter (Signed)
Patient notified of Dr. Jenny Reichmann last note verbally understood and appointment has been scheduled.

## 2021-03-01 NOTE — Telephone Encounter (Signed)
Sure, ok for OV as there must be a reason for the pains.  Remember the lidocaine only coveres up the pain

## 2021-03-20 ENCOUNTER — Encounter (HOSPITAL_BASED_OUTPATIENT_CLINIC_OR_DEPARTMENT_OTHER): Payer: Self-pay | Admitting: Urology

## 2021-03-21 ENCOUNTER — Encounter (HOSPITAL_BASED_OUTPATIENT_CLINIC_OR_DEPARTMENT_OTHER): Payer: Self-pay | Admitting: Urology

## 2021-03-21 ENCOUNTER — Telehealth: Payer: Self-pay | Admitting: *Deleted

## 2021-03-21 ENCOUNTER — Encounter (HOSPITAL_COMMUNITY)
Admission: RE | Admit: 2021-03-21 | Discharge: 2021-03-21 | Disposition: A | Discharge status: Home / Self Care | Payer: Medicare Other | Source: Ambulatory Visit | Attending: Urology | Admitting: Urology

## 2021-03-21 ENCOUNTER — Other Ambulatory Visit: Payer: Self-pay

## 2021-03-21 DIAGNOSIS — Z01812 Encounter for preprocedural laboratory examination: Secondary | ICD-10-CM | POA: Diagnosis not present

## 2021-03-21 LAB — COMPREHENSIVE METABOLIC PANEL
ALT: 20 U/L (ref 0–44)
AST: 21 U/L (ref 15–41)
Albumin: 3.8 g/dL (ref 3.5–5.0)
Alkaline Phosphatase: 118 U/L (ref 38–126)
Anion gap: 6 (ref 5–15)
BUN: 10 mg/dL (ref 8–23)
CO2: 28 mmol/L (ref 22–32)
Calcium: 9.1 mg/dL (ref 8.9–10.3)
Chloride: 99 mmol/L (ref 98–111)
Creatinine, Ser: 0.97 mg/dL (ref 0.61–1.24)
GFR, Estimated: >60 mL/min (ref >60)
Glucose, Bld: 110 mg/dL — ABNORMAL HIGH (ref 70–99)
Potassium: 3.7 mmol/L (ref 3.5–5.1)
Sodium: 133 mmol/L — ABNORMAL LOW (ref 135–145)
Total Bilirubin: 0.3 mg/dL (ref 0.3–1.2)
Total Protein: 7.4 g/dL (ref 6.5–8.1)

## 2021-03-21 LAB — APTT: aPTT: 30 seconds (ref 24–36)

## 2021-03-21 LAB — PROTIME-INR
INR: 0.9 (ref 0.8–1.2)
Prothrombin Time: 12.5 seconds (ref 11.4–15.2)

## 2021-03-21 LAB — CBC
HCT: 39.7 % (ref 39.0–52.0)
Hemoglobin: 11.8 g/dL — ABNORMAL LOW (ref 13.0–17.0)
MCH: 25.3 pg — ABNORMAL LOW (ref 26.0–34.0)
MCHC: 29.7 g/dL — ABNORMAL LOW (ref 30.0–36.0)
MCV: 85 fL (ref 80.0–100.0)
Platelets: 397 10*3/uL (ref 150–400)
RBC: 4.67 MIL/uL (ref 4.22–5.81)
RDW: 16.7 % — ABNORMAL HIGH (ref 11.5–15.5)
WBC: 6.4 10*3/uL (ref 4.0–10.5)
nRBC: 0 % (ref 0.0–0.2)

## 2021-03-21 NOTE — Telephone Encounter (Signed)
Called patient to remind of lab for 03-21-21 @ 9 am, lvm for a return call

## 2021-03-21 NOTE — Progress Notes (Signed)
Spoke w/ via phone for pre-op interview--- Pt Lab needs dos---- no              Lab results------ pt had CBC, CMP, PT/ PTT done 03-21-2021 results in epic;  Current cxr/ ekg in epic/ chart COVID test -----patient states asymptomatic no test needed  Arrive at -------  0930 on 03-23-2021 NPO after MN NO Solid Food.  Clear liquids from MN until--- 0830 Med rec completed Medications to take morning of surgery ----- Gabapentin, Celexa, Flomax, Protonix, eye drop as usual Diabetic medication ----- n/a  Patient instructed no nail polish to be worn day of surgery Patient instructed to bring photo id and insurance card day of surgery Patient aware to have Driver (ride ) / caregiver    for 24 hours after surgery -- nephew,  Nurse, adult caregiver, Leiam Hopwood  Patient Special Instructions ----- will do fleet enema morning of surgery Pre-Op special Istructions ----- pt has pcp clearance to stop plavix/ asa , Dr Cathlean Cower, dated 01-25-2021 received via fax from Dr Jeffie Pollock office and placed in chart  Patient verbalized understanding of instructions that were given at this phone interview. Patient denies shortness of breath, chest pain, fever, cough at this phone interview.   Anesthesia :  HTN;  Hx CVA without residual 2020; hx hyperthyroidism s/p RAI 2011;  Pt denies any cardiac/ stroke s&s.  PCP:  Dr Cathlean Cower (lov 01-08-2021 epic) Neurologist:  Dr Leonie Man  [facility] 06-08-2019 released , epic) Chest x-ray :  01-16-2021 EKG :  09-11-2020 Echo :  10-26-2018 Stress test: no Cardiac Cath : no Activity level: denies sob w/ any activity Sleep Study/ CPAP : no  Blood Thinner/ Instructions /Last Dose: Plavix ASA / Instructions/ Last Dose : ASA 81mg  Pt given instructions to stop plavix / asa prior to surgery by dr Jeffie Pollock office, last dose for both 03-17-2021

## 2021-03-21 NOTE — H&P (Signed)
Mark Dunlap was is seen to discuss treatment of his unfavorable intermediate risk prostate cancer. He has seen Dr. Tammi Klippel and is interested in a seed implant. He has BPH with BOO and OAB and is on tamsulosin and Oxybutynin. He is still having erectile function. He is on hydrocodone chronically for back pain. His PSA has been elevated for several years and started rising in 2015. It was 1.28 in 2014 and was up to 2.59 in 2015.   He is a patient of Dr. Purvis Sheffield and underwent microscopic hematuria work-up and 09/2017. Cystoscopy demonstrated no bladder lesions. 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 He had a prostate biopsy by Dr. Abner Greenspan on 11/21/20.   His most recent PSA on 07/26/2020 was elevated to 5.09. This represents a rise from 4.73 on 12/29/2018. He had a prostate biopsy in 05/11/2019 with benign result. Prostate MRI 10/18/2020 with PI-RADs 2 lesion and questionable left sided bladder lesion although also potential artifact. He denies gross hematuria. Cystoscopy 11/21/2020 revealed no evidence for any lesions.   Patient underwent prostate biopsy on 11/21/2020 for an elevated PSA of 5.09 ng/mL. Biopsy revealed GS 4+3=7 in 2/12 cores, GS 3+4=7 in 3/12 cores, adenocarcinoma of the prostate with 5/12 total cores positive (5-50%), TRUS volume of 23 cm3. Denies new or worsening bone or back pain. Good appetite and stable weight.   Family history: Denies  Imaging studies: Prostate MRI 10/18/2020 with PI-RADs 2 lesion   PMH: Hypertension, hyperlipidemia, GERD, TIA 2020,  PSH: cervical spinal fusion 2019   TNM stage: Clinical stage T1 cNX MX  PSA: 5.09 on 07/26/2020  Gleason score: 4+3=7  Biopsy: 11/21/2020  Left: None  Right: GS 4+3=7 in right apex and right lateral apex, GS 3+4=7 in right mid and right lateral mid and right lateral base  Prostate volume: 23 g  PSAD: 0.22   Nomogram  CSS (5 year, 10 year): 99%, 99%  PFS (5 year, 10 year): 70%, 55%  EPE: 44%  LNI: 8%   SVI: 8%   IPSS: 10  SHIM: Reports difficulty obtaining and maintaining erection. Requesting sildenafil 100 mg today.   He has stable lower urinary tract symptoms. His IPSS score today is 10, Q OL 3. He reports a sensation of incomplete bladder emptying. His PVR is 113 mL. He also reports urinary frequency, intermittency, a weak flow stream. He has 1 time nocturia. He continues on tamsulosin 0.4 mg.   03/07/2021: 60 year old male who presents today for pre operative appointment. He denies any changes to his health history. He feels all of his questions have been answered regarding the surgery. He has some questions about his preoperative covid testing. He denies fevers chills, chest pain, changes to his voiding habits, and shortness of breath.     ALLERGIES: Morphine Sulfate - Itching Oxycodone-Acetaminophen - Itching Tramadol - Itching    MEDICATIONS: Hydrochlorothiazide 1 PO Daily  Sprix 15.75 mg/spray spray, non-aerosol  Tamsulosin Hcl 1 PO Daily  Acetaminophen 500 mg tablet  Adapalene 0.1 % gel  Aspir 81  Atorvastatin Calcium 40 mg tablet 1 tablet PO Daily  Clopidogrel 75 mg tablet  Diphenhydramine Hcl 25 mg tablet  Fluticasone Propionate 50 mcg/actuation spray, suspension  Gabapentin  Hydrocodone-Acetaminophen  Imiquimod 5 % cream in packet  Lumigan 0.01 % drops  Nortriptyline Hcl 25 mg capsule  Ondansetron Hcl 4 mg tablet  Pantoprazole Sodium  Ranitidine Hcl  Refresh Plus  Timolol Maleate  Tizanidine Hcl  Tretinoin 0.1 % cream  Zolpidem  Tartrate     Notes:  [facility]  [address]   GU PSH: Cystoscopy - 11/21/2020 Locm 300-399Mg /Ml Iodine,1Ml - 2019 Prostate Needle Biopsy - 11/21/2020, 05/11/2019       PSH Notes:  cervical fusion 2012   NON-GU PSH: Back surgery Fusion Spine, cervical - 2019 Hip Replacement, Left Surgical Pathology, Gross And Microscopic Examination For Prostate Needle - 11/21/2020, 05/11/2019     GU PMH: BPH w/LUTS, He will continue the  tamsulosin and oxybutynin. - 01/05/2021, - 08/03/2020, - 2018, - 2018, - 2018 ED due to arterial insufficiency, He reports that he has some function and gets by. I discussed the impact of prostate cancer therapy on the ED. - 01/05/2021, (Stable), - 11/28/2020, - 2018 Nocturia - 01/05/2021, - 2018 Prostate Cancer, He has T1c Nx Mx GG3 prostate cancer with a 40ml prostate. He has BPH with BOO and OAB with good symptom control on current therapy. He is on chronic opioids for pain and I will get a baseline testosterone since that can be low with the opioids. He is interested in a seed implant and I reviewed the risks and benefits with him again. I also discussed the use of SpaceOAR and the associated risks as well. We will get him set up in the near future. - 01/05/2021, - 11/28/2020 Urinary Frequency (Stable) - 11/28/2020, - 08/03/2020 Weak Urinary Stream (Stable) - 11/28/2020, - 08/03/2020 Elevated PSA - 11/21/2020, - 08/03/2020, - 2020, - 2018 BPH w/o LUTS - 2020 Gross hematuria - 2019, - 2018    NON-GU PMH: Stroke/TIA - 2020 Arthritis GERD Glaucoma Hypercholesterolemia Hypertension    FAMILY HISTORY: Dialysis - Father Kidney Stones - Brother, Runs in Family stroke - Father   SOCIAL HISTORY: Marital Status: Single Preferred Language: English; Race: Black or African American Current Smoking Status: Patient smokes.   Tobacco Use Assessment Completed: Used Tobacco in last 30 days? Has not drank since 12/13/2011.  Does not use drugs. Drinks 2 caffeinated drinks per day.     Notes: He has abused crack in the past but not in 9 years.    REVIEW OF SYSTEMS:    GU Review Male:   Patient reports frequent urination and get up at night to urinate. Patient denies hard to postpone urination, burning/ pain with urination, leakage of urine, stream starts and stops, trouble starting your stream, have to strain to urinate , erection problems, and penile pain.  Gastrointestinal (Upper):   Patient denies nausea,  vomiting, and indigestion/ heartburn.  Gastrointestinal (Lower):   Patient denies diarrhea and constipation.  Constitutional:   Patient denies fever, night sweats, weight loss, and fatigue.  Skin:   Patient denies skin rash/ lesion and itching.  Hematologic/Lymphatic:   Patient denies swollen glands and easy bruising.  Cardiovascular:   Patient denies leg swelling and chest pains.  Respiratory:   Patient denies cough and shortness of breath.  Musculoskeletal:   Patient denies back pain and joint pain.  Neurological:   Patient denies headaches and dizziness.  Psychologic:   Patient denies depression and anxiety.   VITAL SIGNS:      03/07/2021 02:51 PM  Weight 168 lb / 76.2 kg  Height 72 in / 182.88 cm  BP 172/88 mmHg  Pulse 67 /min  Temperature 97.5 F / 36.3 C  BMI 22.8 kg/m   MULTI-SYSTEM PHYSICAL EXAMINATION:    Constitutional: Well-nourished. No physical deformities. Normally developed. Good grooming.  Respiratory: Normal breath sounds. No labored breathing, no use of accessory muscles.  Cardiovascular: Regular rate and rhythm. No murmur, no gallop. Normal temperature, normal extremity pulses, no swelling, no varicosities.   Skin: No paleness, no jaundice, no cyanosis. No lesion, no ulcer, no rash.  Neurologic / Psychiatric: Oriented to time, oriented to place, oriented to person. No depression, no anxiety, no agitation.  Gastrointestinal: No mass, no tenderness, no rigidity, non obese abdomen.  Eyes: Normal conjunctivae. Normal eyelids.  Musculoskeletal: Normal gait and station of head and neck.     Complexity of Data:  Source Of History:  Patient, Medical Record Summary  Records Review:   Previous Patient Records  Urine Test Review:   Urinalysis   07/26/20 12/29/18 11/30/18 11/04/16  PSA  Total PSA 5.09 ng/mL 4.73 ng/mL 4.44 ng/mL 2.57 ng/dl  Free PSA  0.39 ng/mL 0.48 ng/mL   % Free PSA  8 % PSA 11 % PSA     01/05/21  Hormones  Testosterone, Total 630.6 ng/dL     03/07/21  Urinalysis  Urine Appearance Clear   Urine Color Yellow   Urine Glucose Neg mg/dL  Urine Bilirubin Neg mg/dL  Urine Ketones Neg mg/dL  Urine Specific Gravity 1.010   Urine Blood Neg ery/uL  Urine pH 6.0   Urine Protein Neg mg/dL  Urine Urobilinogen 0.2 mg/dL  Urine Nitrites Neg   Urine Leukocyte Esterase Neg leu/uL  Urine C&S  Culture, Urine -    PROCEDURES:          Urinalysis Dipstick Dipstick Cont'd  Color: Yellow Bilirubin: Neg mg/dL  Appearance: Clear Ketones: Neg mg/dL  Specific Gravity: 1.010 Blood: Neg ery/uL  pH: 6.0 Protein: Neg mg/dL  Glucose: Neg mg/dL Urobilinogen: 0.2 mg/dL    Nitrites: Neg    Leukocyte Esterase: Neg leu/uL    ASSESSMENT:      ICD-10 Details  1 GU:   Prostate Cancer - C61 Chronic, Stable   PLAN:           Orders Labs CULTURE, URINE          Document Letter(s):  Created for Patient: Clinical Summary         Notes:   Urine sent for precautionary culture. All questions answered to the best of my ability regarding space oar and seed placement. He will notify the clinic with any concerns, or changes to his health. KEep upcoming scheduled surgery.         Next Appointment:      Next Appointment: 03/23/2021 11:30 AM    Appointment Type: Surgery     Location: [facility] [phone]    Provider: [name], M.D.    Reason for Visit: [place]

## 2021-03-22 ENCOUNTER — Telehealth: Payer: Self-pay | Admitting: *Deleted

## 2021-03-22 NOTE — Telephone Encounter (Signed)
CALLED PATIENT TO REMIND OF PROCEDURE FOR 03-23-21, LVM FOR A RETURN CALL

## 2021-03-23 ENCOUNTER — Ambulatory Visit (HOSPITAL_COMMUNITY): Payer: Medicare Other

## 2021-03-23 ENCOUNTER — Ambulatory Visit (HOSPITAL_BASED_OUTPATIENT_CLINIC_OR_DEPARTMENT_OTHER)
Admission: RE | Admit: 2021-03-23 | Discharge: 2021-03-23 | Disposition: A | Discharge status: Home / Self Care | Payer: Medicare Other | Attending: Urology | Admitting: Urology

## 2021-03-23 ENCOUNTER — Ambulatory Visit (HOSPITAL_BASED_OUTPATIENT_CLINIC_OR_DEPARTMENT_OTHER): Payer: Medicare Other | Admitting: Anesthesiology

## 2021-03-23 ENCOUNTER — Encounter (HOSPITAL_BASED_OUTPATIENT_CLINIC_OR_DEPARTMENT_OTHER): Payer: Self-pay | Admitting: Urology

## 2021-03-23 ENCOUNTER — Other Ambulatory Visit: Payer: Self-pay

## 2021-03-23 ENCOUNTER — Encounter (HOSPITAL_BASED_OUTPATIENT_CLINIC_OR_DEPARTMENT_OTHER)
Admission: RE | Disposition: A | Discharge status: Home / Self Care | Payer: Self-pay | Source: Home / Self Care | Attending: Urology

## 2021-03-23 DIAGNOSIS — Z7982 Long term (current) use of aspirin: Secondary | ICD-10-CM | POA: Insufficient documentation

## 2021-03-23 DIAGNOSIS — C61 Malignant neoplasm of prostate: Secondary | ICD-10-CM | POA: Diagnosis present

## 2021-03-23 DIAGNOSIS — Z885 Allergy status to narcotic agent status: Secondary | ICD-10-CM | POA: Diagnosis not present

## 2021-03-23 DIAGNOSIS — Z888 Allergy status to other drugs, medicaments and biological substances status: Secondary | ICD-10-CM | POA: Diagnosis not present

## 2021-03-23 DIAGNOSIS — N400 Enlarged prostate without lower urinary tract symptoms: Secondary | ICD-10-CM | POA: Insufficient documentation

## 2021-03-23 DIAGNOSIS — Z79899 Other long term (current) drug therapy: Secondary | ICD-10-CM | POA: Diagnosis not present

## 2021-03-23 DIAGNOSIS — F17200 Nicotine dependence, unspecified, uncomplicated: Secondary | ICD-10-CM | POA: Insufficient documentation

## 2021-03-23 HISTORY — PX: CYSTOSCOPY: SHX5120

## 2021-03-23 HISTORY — DX: Spondylolisthesis, lumbar region: M43.16

## 2021-03-23 HISTORY — DX: Essential (primary) hypertension: I10

## 2021-03-23 HISTORY — PX: RADIOACTIVE SEED IMPLANT: SHX5150

## 2021-03-23 HISTORY — DX: Nontoxic single thyroid nodule: E04.1

## 2021-03-23 HISTORY — PX: SPACE OAR INSTILLATION: SHX6769

## 2021-03-23 HISTORY — DX: Presence of dental prosthetic device (complete) (partial): Z97.2

## 2021-03-23 HISTORY — DX: Radiculopathy, lumbar region: M54.16

## 2021-03-23 HISTORY — DX: Presence of spectacles and contact lenses: Z97.3

## 2021-03-23 HISTORY — DX: Unspecified osteoarthritis, unspecified site: M19.90

## 2021-03-23 HISTORY — DX: Other chronic pain: G89.29

## 2021-03-23 HISTORY — DX: Unspecified glaucoma: H40.9

## 2021-03-23 SURGERY — INSERTION, RADIATION SOURCE, PROSTATE
Anesthesia: General | Site: Prostate

## 2021-03-23 MED ORDER — MIDAZOLAM HCL 2 MG/2ML IJ SOLN
INTRAMUSCULAR | Status: AC
  Filled 2021-03-23: qty 2

## 2021-03-23 MED ORDER — LACTATED RINGERS IV SOLN: INTRAVENOUS | Status: DC

## 2021-03-23 MED ORDER — MIDAZOLAM HCL 5 MG/5ML IJ SOLN
INTRAMUSCULAR | Status: DC | PRN
  Administered 2021-03-23: 1 mg via INTRAVENOUS

## 2021-03-23 MED ORDER — SODIUM CHLORIDE (PF) 0.9 % IJ SOLN
INTRAMUSCULAR | Status: DC | PRN
  Administered 2021-03-23: 10 mL

## 2021-03-23 MED ORDER — SUGAMMADEX SODIUM 500 MG/5ML IV SOLN: INTRAVENOUS | Status: DC | PRN

## 2021-03-23 MED ORDER — PROPOFOL 500 MG/50ML IV EMUL
INTRAVENOUS | Status: AC
  Filled 2021-03-23: qty 50

## 2021-03-23 MED ORDER — ROCURONIUM BROMIDE 100 MG/10ML IV SOLN
INTRAVENOUS | Status: DC | PRN
  Administered 2021-03-23: 50 mg via INTRAVENOUS

## 2021-03-23 MED ORDER — CIPROFLOXACIN IN D5W 400 MG/200ML IV SOLN
INTRAVENOUS | Status: AC
  Filled 2021-03-23: qty 200

## 2021-03-23 MED ORDER — SODIUM CHLORIDE 0.9 % IR SOLN
Status: DC | PRN
  Administered 2021-03-23: 200 mL

## 2021-03-23 MED ORDER — STERILE WATER FOR IRRIGATION IR SOLN
Status: DC | PRN
  Administered 2021-03-23: 3 mL

## 2021-03-23 MED ORDER — FENTANYL CITRATE (PF) 100 MCG/2ML IJ SOLN
INTRAMUSCULAR | Status: DC | PRN
  Administered 2021-03-23 (×2): 50 ug via INTRAVENOUS

## 2021-03-23 MED ORDER — CIPROFLOXACIN IN D5W 400 MG/200ML IV SOLN
400.0000 mg | INTRAVENOUS | Status: AC
  Administered 2021-03-23: 400 mg via INTRAVENOUS

## 2021-03-23 MED ORDER — FENTANYL CITRATE (PF) 100 MCG/2ML IJ SOLN
INTRAMUSCULAR | Status: AC
  Filled 2021-03-23: qty 2

## 2021-03-23 MED ORDER — IOHEXOL 300 MG/ML  SOLN
INTRAMUSCULAR | Status: DC | PRN
  Administered 2021-03-23: 7 mL

## 2021-03-23 MED ORDER — HYDROCODONE-ACETAMINOPHEN 10-325 MG PO TABS
1.0000 | ORAL_TABLET | Freq: Once | ORAL | Status: AC
  Administered 2021-03-23: 1 via ORAL
  Filled 2021-03-23: qty 1

## 2021-03-23 MED ORDER — FENTANYL CITRATE (PF) 100 MCG/2ML IJ SOLN
25.0000 ug | INTRAMUSCULAR | Status: DC | PRN
  Administered 2021-03-23 (×4): 25 ug via INTRAVENOUS

## 2021-03-23 MED ORDER — PROMETHAZINE HCL 25 MG/ML IJ SOLN: 6.2500 mg | INTRAMUSCULAR | Status: DC | PRN

## 2021-03-23 MED ORDER — ACETAMINOPHEN 325 MG RE SUPP: 650.0000 mg | RECTAL | Status: DC | PRN

## 2021-03-23 MED ORDER — PROPOFOL 10 MG/ML IV BOLUS
INTRAVENOUS | Status: DC | PRN
  Administered 2021-03-23: 110 mg via INTRAVENOUS

## 2021-03-23 MED ORDER — ONDANSETRON HCL 4 MG/2ML IJ SOLN
INTRAMUSCULAR | Status: DC | PRN
  Administered 2021-03-23: 4 mg via INTRAVENOUS

## 2021-03-23 MED ORDER — MEPERIDINE HCL 25 MG/ML IJ SOLN: 6.2500 mg | INTRAMUSCULAR | Status: DC | PRN

## 2021-03-23 MED ORDER — DEXAMETHASONE SODIUM PHOSPHATE 10 MG/ML IJ SOLN
INTRAMUSCULAR | Status: DC | PRN
  Administered 2021-03-23: 5 mg via INTRAVENOUS

## 2021-03-23 MED ORDER — SUGAMMADEX SODIUM 200 MG/2ML IV SOLN
INTRAVENOUS | Status: DC | PRN
  Administered 2021-03-23: 150 mg via INTRAVENOUS

## 2021-03-23 MED ORDER — DEXAMETHASONE SODIUM PHOSPHATE 10 MG/ML IJ SOLN
INTRAMUSCULAR | Status: AC
  Filled 2021-03-23: qty 1

## 2021-03-23 MED ORDER — FENTANYL CITRATE (PF) 100 MCG/2ML IJ SOLN: 25.0000 ug | INTRAMUSCULAR | Status: DC | PRN

## 2021-03-23 MED ORDER — SODIUM CHLORIDE 0.9% FLUSH: 3.0000 mL | Freq: Two times a day (BID) | INTRAVENOUS | Status: DC

## 2021-03-23 MED ORDER — ACETAMINOPHEN 325 MG PO TABS: 650.0000 mg | ORAL_TABLET | ORAL | Status: DC | PRN

## 2021-03-23 MED ORDER — SODIUM CHLORIDE 0.9 % IV SOLN: 250.0000 mL | INTRAVENOUS | Status: DC | PRN

## 2021-03-23 MED ORDER — LIDOCAINE HCL (PF) 2 % IJ SOLN
INTRAMUSCULAR | Status: AC
  Filled 2021-03-23: qty 5

## 2021-03-23 MED ORDER — EPHEDRINE SULFATE-NACL 50-0.9 MG/10ML-% IV SOSY
PREFILLED_SYRINGE | INTRAVENOUS | Status: DC | PRN
  Administered 2021-03-23: 10 mg via INTRAVENOUS

## 2021-03-23 MED ORDER — EPHEDRINE 5 MG/ML INJ
INTRAVENOUS | Status: AC
  Filled 2021-03-23: qty 10

## 2021-03-23 MED ORDER — ONDANSETRON HCL 4 MG/2ML IJ SOLN
INTRAMUSCULAR | Status: AC
  Filled 2021-03-23: qty 2

## 2021-03-23 MED ORDER — LIDOCAINE 2% (20 MG/ML) 5 ML SYRINGE
INTRAMUSCULAR | Status: DC | PRN
  Administered 2021-03-23: 60 mg via INTRAVENOUS

## 2021-03-23 MED ORDER — SODIUM CHLORIDE 0.9% FLUSH: 3.0000 mL | INTRAVENOUS | Status: DC | PRN

## 2021-03-23 SURGICAL SUPPLY — 40 items
BAG DRN RND TRDRP ANRFLXCHMBR (UROLOGICAL SUPPLIES) ×2
BAG URINE DRAIN 2000ML AR STRL (UROLOGICAL SUPPLIES) ×4 IMPLANT
BLADE CLIPPER SENSICLIP SURGIC (BLADE) ×4 IMPLANT
CATH FOLEY 2WAY SLVR  5CC 16FR (CATHETERS) ×4
CATH FOLEY 2WAY SLVR 5CC 16FR (CATHETERS) ×2 IMPLANT
CATH ROBINSON RED A/P 16FR (CATHETERS) IMPLANT
CATH ROBINSON RED A/P 20FR (CATHETERS) ×4 IMPLANT
CLOTH BEACON ORANGE TIMEOUT ST (SAFETY) ×4 IMPLANT
CNTNR URN SCR LID CUP LEK RST (MISCELLANEOUS) ×4 IMPLANT
CONT SPEC 4OZ STRL OR WHT (MISCELLANEOUS) ×8
COVER BACK TABLE 60X90IN (DRAPES) ×4 IMPLANT
COVER MAYO STAND STRL (DRAPES) ×4 IMPLANT
DRAPE C-ARM 35X43 STRL (DRAPES) IMPLANT
DRSG TEGADERM 4X4.75 (GAUZE/BANDAGES/DRESSINGS) ×4 IMPLANT
DRSG TEGADERM 8X12 (GAUZE/BANDAGES/DRESSINGS) ×8 IMPLANT
GAUZE SPONGE 4X4 12PLY STRL LF (GAUZE/BANDAGES/DRESSINGS) ×4 IMPLANT
GLOVE SURG ENC MOIS LTX SZ6.5 (GLOVE) ×4 IMPLANT
GLOVE SURG ENC MOIS LTX SZ7.5 (GLOVE) IMPLANT
GLOVE SURG ENC MOIS LTX SZ8 (GLOVE) IMPLANT
GLOVE SURG ORTHO LTX SZ8.5 (GLOVE) ×4 IMPLANT
GLOVE SURG POLYISO LF SZ6.5 (GLOVE) IMPLANT
GLOVE SURG POLYISO LF SZ8 (GLOVE) ×8 IMPLANT
GOWN STRL REUS W/TWL LRG LVL3 (GOWN DISPOSABLE) ×4 IMPLANT
GOWN STRL REUS W/TWL XL LVL3 (GOWN DISPOSABLE) ×4 IMPLANT
HOLDER FOLEY CATH W/STRAP (MISCELLANEOUS) IMPLANT
I-Seed AgX 100 ×260 IMPLANT
IMPL SPACEOAR VUE SYSTEM (Spacer) ×2 IMPLANT
IMPLANT SPACEOAR VUE SYSTEM (Spacer) ×4 IMPLANT
IV NS 1000ML (IV SOLUTION) ×4
IV NS 1000ML BAXH (IV SOLUTION) ×2 IMPLANT
KIT TURNOVER CYSTO (KITS) ×4 IMPLANT
MARKER SKIN DUAL TIP RULER LAB (MISCELLANEOUS) ×4 IMPLANT
PACK CYSTO (CUSTOM PROCEDURE TRAY) ×4 IMPLANT
SURGILUBE 2OZ TUBE FLIPTOP (MISCELLANEOUS) IMPLANT
SUT BONE WAX W31G (SUTURE) IMPLANT
SYR 10ML LL (SYRINGE) ×8 IMPLANT
TOWEL OR 17X26 10 PK STRL BLUE (TOWEL DISPOSABLE) ×4 IMPLANT
UNDERPAD 30X36 HEAVY ABSORB (UNDERPADS AND DIAPERS) ×8 IMPLANT
WATER STERILE IRR 3000ML UROMA (IV SOLUTION) IMPLANT
WATER STERILE IRR 500ML POUR (IV SOLUTION) ×4 IMPLANT

## 2021-03-23 NOTE — Anesthesia Preprocedure Evaluation (Signed)
Anesthesia Evaluation  Patient identified by MRN, date of birth, ID band Patient awake    Reviewed: Allergy & Precautions, NPO status , Patient's Chart, lab work & pertinent test results  Airway Mallampati: II  TM Distance: >3 FB Neck ROM: Limited    Dental  (+) Edentulous Upper, Edentulous Lower, Dental Advisory Given   Pulmonary Current Smoker,    breath sounds clear to auscultation       Cardiovascular hypertension, Pt. on medications  Rhythm:Regular Rate:Normal     Neuro/Psych PSYCHIATRIC DISORDERS Anxiety Depression TIA Neuromuscular disease CVA, No Residual Symptoms    GI/Hepatic GERD  Poorly Controlled,  Endo/Other    Renal/GU Renal disease     Musculoskeletal  (+) Arthritis ,   Abdominal   Peds  Hematology  (+) anemia ,   Anesthesia Other Findings   Reproductive/Obstetrics                             Anesthesia Physical  Anesthesia Plan  ASA: 3  Anesthesia Plan: General   Post-op Pain Management:    Induction: Intravenous  PONV Risk Score and Plan: 3 and Ondansetron, Dexamethasone, Treatment may vary due to age or medical condition, Diphenhydramine and Midazolam  Airway Management Planned: Oral ETT  Additional Equipment: None  Intra-op Plan:   Post-operative Plan: Extubation in OR  Informed Consent: I have reviewed the patients History and Physical, chart, labs and discussed the procedure including the risks, benefits and alternatives for the proposed anesthesia with the patient or authorized representative who has indicated his/her understanding and acceptance.     Dental advisory given  Plan Discussed with: CRNA  Anesthesia Plan Comments:         Anesthesia Quick Evaluation

## 2021-03-23 NOTE — Anesthesia Procedure Notes (Signed)
Procedure Name: Intubation Date/Time: 03/23/2021 11:33 AM Performed by: Bonney Aid, CRNA Pre-anesthesia Checklist: Patient identified, Emergency Drugs available, Suction available and Patient being monitored Patient Re-evaluated:Patient Re-evaluated prior to induction Oxygen Delivery Method: Circle system utilized Preoxygenation: Pre-oxygenation with 100% oxygen Induction Type: IV induction Ventilation: Mask ventilation without difficulty Laryngoscope Size: Mac and 4 Grade View: Grade I Tube type: Oral Tube size: 7.5 mm Number of attempts: 1 Airway Equipment and Method: Stylet Placement Confirmation: ETT inserted through vocal cords under direct vision, positive ETCO2 and breath sounds checked- equal and bilateral Secured at: 22 cm Tube secured with: Tape Dental Injury: Teeth and Oropharynx as per pre-operative assessment

## 2021-03-23 NOTE — Transfer of Care (Signed)
Immediate Anesthesia Transfer of Care Note  Patient: Mark Dunlap  Procedure(s) Performed: RADIOACTIVE SEED IMPLANT/BRACHYTHERAPY IMPLANT (Prostate) SPACE OAR INSTILLATION (Prostate) CYSTOSCOPY FLEXIBLE (Bladder)  Patient Location: PACU  Anesthesia Type:General  Level of Consciousness: drowsy  Airway & Oxygen Therapy: Patient Spontanous Breathing and Patient connected to nasal cannula oxygen  Post-op Assessment: Report given to RN  Post vital signs: Reviewed and stable  Last Vitals:  Vitals Value Taken Time  BP 142/92   Temp    Pulse 90 03/23/21 1253  Resp 18 03/23/21 1253  SpO2 100 % 03/23/21 1253  Vitals shown include unvalidated device data.  Last Pain:  Vitals:   03/23/21 1006  TempSrc: (P) Oral  PainSc: (P) 0-No pain      Patients Stated Pain Goal: (P) 7 (00/71/21 9758)  Complications: No notable events documented.

## 2021-03-23 NOTE — Op Note (Signed)
PATIENT:  [name]  PRE-OPERATIVE DIAGNOSIS:  Adenocarcinoma of the prostate  POST-OPERATIVE DIAGNOSIS:  Same  PROCEDURE:  Procedure(s): 1. I-125 radioactive seed implantation 2. SpaceOAR implantation. 3.  Cystoscopy  SURGEON:  Surgeon(s): [name] MD  Radiation oncologist: Dr. Tyler Pita  ANESTHESIA:  General  EBL:  none  DRAINS: 40 French Foley catheter  INDICATION: AMAAD BYERS is a 60 y.o. with Stage T1c, Gleason 7(4+3) prostate cancer who has elected brachytherapy for treatment.  Description of procedure: After informed consent the patient was brought to the major OR, placed on the table and administered general anesthesia. He was then moved to the modified lithotomy position with his perineum perpendicular to the floor. His perineum and genitalia were then sterilely prepped. An official timeout was then performed. A 16 French Foley catheter was then placed in the bladder and filled with dilute contrast, a rectal tube was placed in the rectum and the transrectal ultrasound probe was placed in the rectum and affixed to the stand. He was then sterilely draped.  The sterile grid was installed.   Anchor needles were then placed.   Real time ultrasonography was used along with the seed planning software spot-pro version 3.1-00. This was used to develop the seed plan including the number of needles as well as number of seeds required for complete and adequate coverage. Real-time ultrasonography was then used along with the previously developed plan  to implant a total of 65 seeds using 17 needles for a target dose of 145 Gy. This proceeded without difficulty or complication.  The anchor needles and guide were removed and the SpaceOAR needle was passed under US guidance into the fat stripe posterior to the prostate with the tip in the midline at mid prostate. A puff of NS confirmed appropriate positioning and the SpaceOAR view polymer was then injected over 10 seconds  into the space with excellent distribution.     A Foley catheter was then removed as well as the transrectal ultrasound probe and rectal probe. Flexible cystoscopy was then performed using the 17 French flexible scope which revealed a normal urethra throughout its length down to the sphincter which appeared intact. The prostatic urethra was short with minimal hyperplasia. The bladder was then entered and fully and systematically inspected.  The ureteral orifices were noted to be of normal configuration and position. The mucosa revealed no evidence of tumors. There were also no stones identified within the bladder.  NO seeds or spacers were seen and/or removed from the bladder.  The cystoscope was then removed.  The drapes were removed.  The perineum was cleaned and dressed.  He was taken out of the lithotomy position and was awakened and taken to recovery room in stable and satisfactory condition. He tolerated procedure well and there were no intraoperative complications.

## 2021-03-23 NOTE — Interval H&P Note (Signed)
History and Physical Interval Note:  03/23/2021 9:50 AM  Mark Dunlap  has presented today for surgery, with the diagnosis of PROSTATE CANCER.  The various methods of treatment have been discussed with the patient and family. After consideration of risks, benefits and other options for treatment, the patient has consented to  Procedure(s): RADIOACTIVE SEED IMPLANT/BRACHYTHERAPY IMPLANT (N/A) SPACE OAR INSTILLATION (N/A) as a surgical intervention.  The patient's history has been reviewed, patient examined, no change in status, stable for surgery.  I have reviewed the patient's chart and labs.  Questions were answered to the patient's satisfaction.     [name]

## 2021-03-23 NOTE — Interval H&P Note (Signed)
History and Physical Interval Note:  03/23/2021 9:48 AM  Mark Dunlap  has presented today for surgery, with the diagnosis of PROSTATE CANCER.  The various methods of treatment have been discussed with the patient and family. After consideration of risks, benefits and other options for treatment, the patient has consented to  Procedure(s): RADIOACTIVE SEED IMPLANT/BRACHYTHERAPY IMPLANT (N/A) SPACE OAR INSTILLATION (N/A) as a surgical intervention.  The patient's history has been reviewed, patient examined, no change in status, stable for surgery.  I have reviewed the patient's chart and labs.  Questions were answered to the patient's satisfaction.     [name]

## 2021-03-23 NOTE — Discharge Instructions (Addendum)
See instructions   Post Anesthesia Home Care Instructions  Activity: Get plenty of rest for the remainder of the day. A responsible individual must stay with you for 24 hours following the procedure.  For the next 24 hours, DO NOT: -Drive a car -Paediatric nurse -Drink alcoholic beverages -Take any medication unless instructed by your physician -Make any legal decisions or sign important papers.  Meals: Start with liquid foods such as gelatin or soup. Progress to regular foods as tolerated. Avoid greasy, spicy, heavy foods. If nausea and/or vomiting occur, drink only clear liquids until the nausea and/or vomiting subsides. Call your physician if vomiting continues.  Special Instructions/Symptoms: Your throat may feel dry or sore from the anesthesia or the breathing tube placed in your throat during surgery. If this causes discomfort, gargle with warm salt water. The discomfort should disappear within 24 hours.  If you had a scopolamine patch placed behind your ear for the management of post- operative nausea and/or vomiting:  1. The medication in the patch is effective for 72 hours, after which it should be removed.  Wrap patch in a tissue and discard in the trash. Wash hands thoroughly with soap and water. 2. You may remove the patch earlier than 72 hours if you experience unpleasant side effects which may include dry mouth, dizziness or visual disturbances. 3. Avoid touching the patch. Wash your hands with soap and water after contact with the patch.

## 2021-03-23 NOTE — Anesthesia Preprocedure Evaluation (Deleted)
Anesthesia Evaluation    Airway        Dental   Pulmonary Current Smoker,           Cardiovascular hypertension,      Neuro/Psych    GI/Hepatic   Endo/Other    Renal/GU      Musculoskeletal   Abdominal   Peds  Hematology   Anesthesia Other Findings   Reproductive/Obstetrics                             Anesthesia Physical Anesthesia Plan Anesthesia Quick Evaluation  

## 2021-03-23 NOTE — Progress Notes (Signed)
  Radiation Oncology         [phone] ________________________________  Name: MACKENZIE LIA MRN: [id number]  Date: 03/23/2021  DOB: [birth year]       Prostate Seed Implant  VO:HYWV, Hunt Oris, MD  No ref. provider found  DIAGNOSIS:  y.o. gentleman with Stage T1c adenocarcinoma of the prostate with Gleason score of 4+3, and PSA of 5.09.  Oncology History  Malignant neoplasm of prostate ([place])  11/21/2020 Cancer Staging   Staging form: Prostate, AJCC 8th Edition - Clinical stage from 11/21/2020: Stage IIC (cT1c, cN0, cM0, PSA: 5.1, Grade Group: 3) - Signed by Freeman Caldron, PA-C on 12/12/2020  Histopathologic type: Adenocarcinoma, NOS  Prostate specific antigen (PSA) range: Less than 10  Gleason primary pattern: 4  Gleason secondary pattern: 3  Gleason score: 7  Histologic grading system: 5 grade system  Number of biopsy cores examined: 12  Number of biopsy cores positive: 5  Location of positive needle core biopsies: One side    12/12/2020 Initial Diagnosis   Malignant neoplasm of prostate ([facility])     PROCEDURE: Insertion of radioactive I-125 seeds into the prostate gland.  RADIATION DOSE: 145 Gy, definitive therapy.  TECHNIQUE: LYNDOL VANDERHEIDEN was brought to the operating room with the urologist. He was placed in the dorsolithotomy position. He was catheterized and a rectal tube was inserted. The perineum was shaved, prepped and draped. The ultrasound probe was then introduced into the rectum to see the prostate gland.  TREATMENT DEVICE: A needle grid was attached to the ultrasound probe stand and anchor needles were placed.  3D PLANNING: The prostate was imaged in 3D using a sagittal sweep of the prostate probe. These images were transferred to the planning computer. There, the prostate, urethra and rectum were defined on each axial reconstructed image. Then, the software created an optimized 3D plan and a few seed positions were adjusted. The quality of the plan was  reviewed using [facility] information for the target and the following two organs at risk:  Urethra and Rectum.  Then the accepted plan was printed and handed off to the radiation therapist.  Under my supervision, the custom loading of the seeds and spacers was carried out and loaded into sealed vicryl sleeves.  These pre-loaded needles were then placed into the needle holder.[name]  PROSTATE VOLUME STUDY:  Using transrectal ultrasound the volume of the prostate was verified to be 28 cc.  SPECIAL TREATMENT PROCEDURE/SUPERVISION AND HANDLING: The pre-loaded needles were then delivered under sagittal guidance. A total of 17 needles were used to deposit 65 seeds in the prostate gland. The individual seed activity was 0.379 mCi.  SpaceOAR:  Yes  COMPLEX SIMULATION: At the end of the procedure, an anterior radiograph of the pelvis was obtained to document seed positioning and count. Cystoscopy was performed to check the urethra and bladder.  MICRODOSIMETRY: At the end of the procedure, the patient was emitting 0.060 mR/hr at 1 meter. Accordingly, he was considered safe for hospital discharge.  PLAN: The patient will return to the radiation oncology clinic for post implant CT dosimetry in three weeks.   ________________________________  Sheral Apley Tammi Klippel, M.D.

## 2021-03-25 NOTE — Anesthesia Postprocedure Evaluation (Signed)
Anesthesia Post Note  Patient: Mark Dunlap  Procedure(s) Performed: RADIOACTIVE SEED IMPLANT/BRACHYTHERAPY IMPLANT (Prostate) SPACE OAR INSTILLATION (Prostate) CYSTOSCOPY FLEXIBLE (Bladder)     Patient location during evaluation: PACU Anesthesia Type: General Level of consciousness: sedated and patient cooperative Pain management: pain level controlled Vital Signs Assessment: post-procedure vital signs reviewed and stable Respiratory status: spontaneous breathing Cardiovascular status: stable Anesthetic complications: no   No notable events documented.  Last Vitals:  Vitals:   03/23/21 1330 03/23/21 1415  BP: (!) 141/92 (!) 150/100  Pulse: 71 76  Resp: 14 16  Temp:    SpO2: 95% 95%    Last Pain:  Vitals:   03/23/21 1415  TempSrc:   PainSc: [place]

## 2021-03-26 ENCOUNTER — Encounter (HOSPITAL_BASED_OUTPATIENT_CLINIC_OR_DEPARTMENT_OTHER): Payer: Self-pay | Admitting: Urology

## 2021-03-28 ENCOUNTER — Encounter: Payer: Self-pay | Admitting: Internal Medicine

## 2021-03-28 ENCOUNTER — Ambulatory Visit: Payer: Medicare Other | Admitting: Internal Medicine

## 2021-03-28 ENCOUNTER — Other Ambulatory Visit: Payer: Self-pay

## 2021-03-28 VITALS — BP 160/90 | HR 71 | Temp 98.5°F | Ht 75.0 in | Wt 167.0 lb

## 2021-03-28 DIAGNOSIS — E7800 Pure hypercholesterolemia, unspecified: Secondary | ICD-10-CM | POA: Diagnosis not present

## 2021-03-28 DIAGNOSIS — M79644 Pain in right finger(s): Secondary | ICD-10-CM | POA: Diagnosis not present

## 2021-03-28 DIAGNOSIS — R739 Hyperglycemia, unspecified: Secondary | ICD-10-CM

## 2021-03-28 DIAGNOSIS — I700 Atherosclerosis of aorta: Secondary | ICD-10-CM

## 2021-03-28 DIAGNOSIS — I10 Essential (primary) hypertension: Secondary | ICD-10-CM | POA: Diagnosis not present

## 2021-03-28 DIAGNOSIS — F17200 Nicotine dependence, unspecified, uncomplicated: Secondary | ICD-10-CM

## 2021-03-28 MED ORDER — LIDOCAINE VISCOUS HCL 2 % MT SOLN: 15.0000 mL | Freq: Every day | OROMUCOSAL | 2 refills | Status: DC | PRN

## 2021-03-28 MED ORDER — TRETINOIN 0.1 % EX CREA: 1.0000 "application " | TOPICAL_CREAM | Freq: Every day | CUTANEOUS | 2 refills | Status: DC

## 2021-03-28 NOTE — Progress Notes (Signed)
Patient ID: Mark Dunlap, male   DOB: 11-18-1960, 60 y.o.   MRN: [id number]        Chief Complaint: follow up HTN, HLD and hyperglycemia, acne, right finger pain, and smoking       HPI:  Mark Dunlap is a 60 y.o. male here with c/o recent tx for prostate ca  - has been pelvic Sore all wk after radioactive seed implants done late last wk, has some minor blood with urination, but Denies urinary symptoms such as dysuria, frequency, urgency, flank pain or n/v, fever, chills.  Still smoking, not ready to quit.  Has ongoing acneform lesions to face recurrent for several years, asks for tretinoin refill as worked well in past.  Pt denies chest pain, increased sob or doe, wheezing, orthopnea, PND, increased LE swelling, palpitations, dizziness or syncope.   Pt denies polydipsia, polyuria, or new focal neuro s/s.  BP has been < 140;90 at home, but not checked recently.  Also has worsening and persistent right finger pain for > 6 mo, now cant ignore it so much, hurts to use the hand, better to rest overal mild to mod, no recent trauma Wt Readings from Last 3 Encounters:  03/28/21 167 lb (75.8 kg)  03/23/21 165 lb 9.6 oz (75.1 kg)  03/21/21 167 lb (75.8 kg)   BP Readings from Last 3 Encounters:  03/28/21 (!) 160/90  03/23/21 (!) 150/100  03/21/21 (!) 163/92         Past Medical History:  Diagnosis Date   Chronic back pain    upper to lower   Depression    GERD (gastroesophageal reflux disease)    Glaucoma, both eyes    H/O: substance abuse ([place])    03-21-2021   pt stated hx of ETOH/Crack cocaine-none since 12/2012 per pt/Does not Drive due to this   History of CVA (cerebrovascular accident) without residual deficits 10/26/2018   right BG/ CR infarct secondart to moderate SVD,  no residual   History of hyperthyroidism 2011   s/p RAI  06-20-2010,  followed by pcp   History of prostatitis 2010   HLD (hyperlipidemia)    Hypertension    followed by pcp    (nuclear stress test in epic  08-30-2013  normal no ischemia , ef 50%)   Legally blind in left eye, as defined in [place]    Blind Left eye, small amt vision Right eye   Lumbar radiculopathy    OA (osteoarthritis)    Prostate cancer [facility]) urologist--- dr Louis Meckel   dx 11-21-2020, Stage T1c, Gleason 4+3   Right thyroid nodule    Seasonal allergies    Spondylolisthesis, lumbar region    Wears dentures    upper full and lower partial   Wears glasses    Past Surgical History:  Procedure Laterality Date   ANTERIOR CERVICAL DECOMP/DISCECTOMY FUSION  10/11/2011   Procedure: ANTERIOR CERVICAL DECOMPRESSION/DISCECTOMY FUSION 3 LEVELS;  Surgeon: Eustace Moore;  Location: [place] [facility];  Service: Neurosurgery;  Laterality: N/A;  Cervical three-four ,cervical four five cervical five six Anterior Cervical Decompression Fusion with peek + plate Nuvasive translational plate Orthofix peek (2 1/2 hours) Rm # [id number]   ANTERIOR CERVICAL DECOMP/DISCECTOMY FUSION N/A 07/29/2014   Procedure: ANTERIOR CERVICAL DECOMPRESSION/DISCECTOMY FUSION 1 LEVEL/HARDWARE REMOVAL;  Surgeon: Eustace Moore, MD;  Location: [place] [facility];  Service: Neurosurgery;  Laterality: N/A;  cervical six-seven   COLONOSCOPY     COLONOSCOPY  lastone 07-12-2020   CYSTOSCOPY N/A 03/23/2021  Procedure: CYSTOSCOPY FLEXIBLE;  Surgeon: [name], MD;  Location: [facility];  Service: Urology;  Laterality: N/A;   HAND SURGERY Right x2  12/ 2007   I & D abscess   HARDWARE REMOVAL N/A 09/11/2020   Procedure: Removal of hardware LUmbar Four-Lumbar Five;  Surgeon: Eustace Moore, MD;  Location: [place];  Service: Neurosurgery;  Laterality: N/A;   LAMINECTOMY WITH POSTERIOR LATERAL ARTHRODESIS LEVEL 2 N/A 09/11/2020   Procedure: Laminectomy and Foraminotomy - Lumbar Three-Lumbar Four, posterior lateral fusion Lumbar Three-Four and Lumbar Four- Five;  Surgeon: Eustace Moore, MD;  Location: [place];  Service: Neurosurgery;  Laterality: N/A;   POSTERIOR CERVICAL  FUSION/FORAMINOTOMY N/A 09/23/2013   Procedure: CERVICALTWO TO CERVICAL SEVEN POSTERIOR CERVICAL FUSION/FORAMINOTOMY WITH LATERAL MASS FIXATION;  Surgeon: Eustace Moore, MD;  Location: [place] [facility];  Service: Neurosurgery;  Laterality: N/A;   POSTERIOR LUMBAR FUSION  11-27-2017  @[facility]    L4--5   RADIOACTIVE SEED IMPLANT N/A 03/23/2021   Procedure: RADIOACTIVE SEED IMPLANT/BRACHYTHERAPY IMPLANT;  Surgeon: [name], MD;  Location[facility];  Service: Urology;  Laterality: N/A;   SPACE OAR INSTILLATION N/A 03/23/2021   Procedure: SPACE OAR INSTILLATION;  Surgeon: [name], MD;  Location[facility];  Service: Urology;  Laterality: N/A;   TOTAL HIP ARTHROPLASTY  12/17/2011   Procedure: TOTAL HIP ARTHROPLASTY;  Surgeon: Johnny Bridge, MD;  Location: [place];  Service: Orthopedics;  Laterality: Left;    reports that he has been smoking cigarettes. He has a 35.25 pack-year smoking history. He has never used smokeless tobacco. He reports previous drug use. Drug: "Crack" cocaine. He reports that he does not drink alcohol. family history includes Alcohol abuse in his brother, cousin, and father; Arthritis in his mother; Cancer in his brother and sister; Diabetes in his brother and sister; Goiter in his mother and sister; Heart disease in his father and another family member; Hyperlipidemia in his mother; Hypertension in his sister; Stroke in his father. Allergies  Allergen Reactions   Tramadol Itching   Morphine And Related Itching   Oxycodone Itching   Current Outpatient Medications on File Prior to Visit  Medication Sig Dispense Refill   adapalene (DIFFERIN) 0.1 % gel APPLY 1 APPLICATION TOPICALLY AT BEDTIME. 45 g 1   aspirin EC 81 MG tablet Take 81 mg by mouth daily. Swallow whole.     atorvastatin (LIPITOR) 40 MG tablet TAKE 1 TABLET BY MOUTH ONCE DAILY AT  6  PM 90 tablet 3   bimatoprost (LUMIGAN) 0.01 % SOLN Place 1 drop into both eyes at bedtime.     citalopram  (CELEXA) 20 MG tablet Take 1 tablet (20 mg total) by mouth daily. 90 tablet 3   clopidogrel (PLAVIX) 75 MG tablet Take 1 tablet (75 mg total) by mouth daily. 90 tablet 3   diazepam (VALIUM) 10 MG tablet Take 10 mg by mouth as needed.     diphenhydrAMINE (BENADRYL) 25 mg capsule Take 50 mg by mouth every 6 (six) hours as needed for itching.      famotidine (PEPCID) 40 MG tablet Take 1 tablet (40 mg total) by mouth 2 (two) times daily. Take every morning and at bedtime 60 tablet 3   fluticasone (FLONASE) 50 MCG/ACT nasal spray USE 2 SPRAYS DAILY IN BOTH NOSTRILS 32 mL 0   gabapentin (NEURONTIN) 100 MG capsule Take 200 mg by mouth 2 (two) times daily.  2   hydrochlorothiazide (MICROZIDE) 12.5 MG capsule  TAKE 1 CAPSULE BY MOUTH EVERY DAY (Patient taking differently: Take 12.5 mg by mouth daily. TAKE 1 CAPSULE BY MOUTH EVERY DAY) 90 capsule 1   HYDROcodone-acetaminophen (NORCO) 10-325 MG tablet Take 1 tablet by mouth every 6 (six) hours as needed for moderate pain. 30 tablet 0   imiquimod (ALDARA) 5 % cream APPLY 3 TIMES A WEEK 24 each 0   Multiple Vitamins-Minerals (ONE DAILY FOR MEN/LYCOPENE) TABS Take 1 tablet by mouth daily.     nortriptyline (PAMELOR) 25 MG capsule Take 25 mg by mouth at bedtime.  0   oxybutynin (DITROPAN) 5 MG tablet TAKE 1 TABLET BY MOUTH AT BEDTIME (Patient taking differently: Take 5 mg by mouth at bedtime.) 90 tablet 0   pantoprazole (PROTONIX) 40 MG tablet Take 1 tablet (40 mg total) by mouth daily. 90 tablet 3   sildenafil (VIAGRA) 100 MG tablet Take 100 mg by mouth daily.     sucralfate (CARAFATE) 1 g tablet Take 1 tablet (1 g total) by mouth 3 (three) times daily. Take 20 minutes after meals and at bedtime 120 tablet 1   tamsulosin (FLOMAX) 0.4 MG CAPS capsule Take 1 capsule by mouth twice daily (Patient taking differently: Take 0.4 mg by mouth 2 (two) times daily. Take 1 capsule by mouth twice daily) 180 capsule 3   timolol (TIMOPTIC) 0.5 % ophthalmic solution Place 1 drop  into both eyes daily.  6   tiZANidine (ZANAFLEX) 4 MG tablet Take 4 mg by mouth every 8 (eight) hours as needed for muscle spasms.      zolpidem (AMBIEN) 10 MG tablet Take 1 tablet (10 mg total) by mouth at bedtime as needed. for sleep 90 tablet 1   No current facility-administered medications on file prior to visit.        ROS:  All others reviewed and negative.  Objective        PE:  BP (!) 160/90 (BP Location: Left Arm, Patient Position: Sitting, Cuff Size: Normal)   Pulse 71   Temp 98.5 F (36.9 C) (Oral)   Ht 6\' 3"  (1.905 m)   Wt 167 lb (75.8 kg)   SpO2 99%   BMI 20.87 kg/m                 Constitutional: Pt appears in NAD               HENT: Head: NCAT.                Right Ear: External ear normal.                 Left Ear: External ear normal.                Eyes: . Pupils are equal, round, and reactive to light. Conjunctivae and EOM are normal               Nose: without d/c or deformity               Neck: Neck supple. Gross normal ROM               Cardiovascular: Normal rate and regular rhythm.                 Pulmonary/Chest: Effort normal and breath sounds without rales or wheezing.                Abd:  Soft, NT, ND, + BS, no organomegaly  Neurological: Pt is alert. At baseline orientation, motor grossly intact               Skin: Skin is warm. No rashes, no other new lesions, LE edema -none               Psychiatric: Pt behavior is normal without agitation   Micro: none  Cardiac tracings I have personally interpreted today:  none  Pertinent Radiological findings (summarize): none   Lab Results  Component Value Date   WBC 6.4 03/21/2021   HGB 11.8 (L) 03/21/2021   HCT 39.7 03/21/2021   PLT 397 03/21/2021   GLUCOSE 110 (H) 03/21/2021   CHOL 152 05/02/2020   TRIG 140 05/02/2020   HDL 66 05/02/2020   LDLDIRECT 100.0 09/02/2018   LDLCALC 64 05/02/2020   ALT 20 03/21/2021   AST 21 03/21/2021   NA 133 (L) 03/21/2021   K 3.7 03/21/2021   CL  99 03/21/2021   CREATININE 0.97 03/21/2021   BUN 10 03/21/2021   CO2 28 03/21/2021   TSH 1.33 11/05/2019   PSA 4.2 (H) 05/02/2020   INR 0.9 03/21/2021   HGBA1C 5.5 05/03/2019   Assessment/Plan:  Mark Dunlap is a 60 y.o. Black or African American [2] male with  has a past medical history of Chronic back pain, Depression, GERD (gastroesophageal reflux disease), Glaucoma, both eyes, H/O: substance abuse ([place]), History of CVA (cerebrovascular accident) without residual deficits (10/26/2018), History of hyperthyroidism (2011), History of prostatitis (2010), HLD (hyperlipidemia), Hypertension, Legally blind in left eye, as defined in [place], Lumbar radiculopathy, OA (osteoarthritis), Prostate cancer ([place]) (urologist--- dr Louis Meckel), Right thyroid nodule, Seasonal allergies, Spondylolisthesis, lumbar region, Wears dentures, and Wears glasses.  Aortic atherosclerosis ([facility]) D/ pt, to continue liiptor 40, low chol diet and excercise  Essential hypertension Uncontrolled mild elevated, pt declines med change for now, and will re start monitoring more closely at home, to f/u any worsening symptoms or concerns  Hyperlipidemia Lab Results  Component Value Date   LDLCALC 64 05/02/2020   Stable, pt to continue current statin lipitor 40   Smoker  Counseled to quit pt not ready  Finger pain, right Mild to mod, worsening persistent, for hand surgury referral  Hyperglycemia Lab Results  Component Value Date   HGBA1C 5.5 05/03/2019   Stable, pt to continue current medical treatment  diet  Followup: Return in about 3 months (around 06/28/2021).  Cathlean Cower, MD 04/01/2021 2:10 PM [facility]

## 2021-03-28 NOTE — Patient Instructions (Signed)
Please remember to check your BP at home on a regular basis, such as once daily for 10 days, and let us know if the average is more than 140/90  Please continue all other medications as before, and refills have been done if requested - the lidocaine, and the tretinoin  Please have the pharmacy call with any other refills you may need.  Please quit smoking  Please continue your efforts at being more active, low cholesterol diet, and weight control. - especially if you quit smoking  You are otherwise up to date with prevention measures today.  You will be contacted regarding the referral for: Hand Surgury for the fingers  Please keep your appointments with your specialists as you may have planned  Please make an Appointment to return in 3 months, with blood work to be done at the [facility] [place] site a fews ahead

## 2021-03-29 ENCOUNTER — Ambulatory Visit (INDEPENDENT_AMBULATORY_CARE_PROVIDER_SITE_OTHER): Payer: Medicare Other | Admitting: Orthopaedic Surgery

## 2021-03-29 ENCOUNTER — Encounter: Payer: Self-pay | Admitting: Orthopaedic Surgery

## 2021-03-29 ENCOUNTER — Ambulatory Visit: Payer: Self-pay

## 2021-03-29 DIAGNOSIS — M79641 Pain in right hand: Secondary | ICD-10-CM | POA: Diagnosis not present

## 2021-03-29 NOTE — Progress Notes (Signed)
Office Visit Note   Patient: Mark Dunlap           Date of Birth: [birth year]           MRN: [id number] Visit Date: 03/29/2021              Requested by: Biagio Borg, MD [address] [place],  [place] [address] PCP: Biagio Borg, MD   Assessment & Plan: Visit Diagnoses:  1. Pain of right hand     Plan: Overall the findings appear to be related to an underlying circulatory or peripheral vascular disease such as Buerger's disease from his smoking.  I recommended that he trim the edges of the nail shorter so that they do not get hung up but I recommended against removing the nail entirely as this could potentially cause problems and increased the risk for infection especially with poor circulation.  He has been counseled to stop smoking.  I have recommended allowing this process to involve as I feel that this will likely resolve on its own.  Questions encouraged and answered.  Follow-up as needed.  Follow-Up Instructions: No follow-ups on file.   Orders:  Orders Placed This Encounter  Procedures   XR Hand Complete Right   No orders of the defined types were placed in this encounter.     Procedures: No procedures performed   Clinical Data: No additional findings.   Subjective: Chief Complaint  Patient presents with   Right Index Finger - Pain   Right Middle Finger - Pain    Mark Dunlap is a 60 year old gentleman who comes in for evaluation of discoloration and changes to his right index and middle finger nails.  He states that he may have had an injury about a year ago.  Currently he feels no pain in the fingertips and he feels like the nail is trying to come off.  He is a smoker on a daily basis.  He denies any numbness or constitutional symptoms.   Review of Systems  Constitutional: Negative.   All other systems reviewed and are negative.   Objective: Vital Signs: There were no vitals taken for this visit.  Physical Exam Vitals and nursing note  reviewed.  Constitutional:      Appearance: He is well-developed.  HENT:     Head: Normocephalic and atraumatic.  Eyes:     Pupils: Pupils are equal, round, and reactive to light.  Pulmonary:     Effort: Pulmonary effort is normal.  Abdominal:     Palpations: Abdomen is soft.  Musculoskeletal:        General: Normal range of motion.     Cervical back: Neck supple.  Skin:    General: Skin is warm.  Neurological:     Mental Status: He is alert and oriented to person, place, and time.  Psychiatric:        Behavior: Behavior normal.        Thought Content: Thought content normal.        Judgment: Judgment normal.    Ortho Exam Right index and long fingers both show dark discoloration on the ulnar side of both fingers.  The ulnar portion of the nail appears to be lifting off of the nailbed.  There is no active infection or signs of neurovascular compromise.  There is no swelling. Specialty Comments:  No specialty comments available.  Imaging: XR Hand Complete Right  Result Date: 03/29/2021 No acute or structural abnormalities.    [place]  History: Patient Active Problem List   Diagnosis Date Noted   Aortic atherosclerosis ([place]) 03/28/2021   Spondylolisthesis, lumbar region 02/15/2021   Ischial bursitis 01/22/2021   Pain and swelling of left lower leg 01/05/2021   Right-sided ischial pain 01/04/2021   Malignant neoplasm of prostate ([place]) 12/12/2020   Preop exam for internal medicine 10/31/2020   Grief 10/31/2020   S/P lumbar laminectomy 09/11/2020   Lumbar pseudoarthrosis 08/31/2020   Muscle pain 08/24/2020   Right foot pain 11/07/2019   Anxiety and depression 05/03/2019   Cough 03/30/2019   CVA (cerebral vascular accident) ([place]) 10/27/2018   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 ([place]) 10/26/2018   TIA (transient ischemic attack) 10/26/2018   Insomnia 10/26/2018   Elevated PSA 09/07/2018   Perianal wart 04/23/2018   Bony pelvic pain 03/16/2018   Acne 02/17/2018    S/P lumbar spinal fusion 11/27/2017   GERD (gastroesophageal reflux disease) 09/13/2016   Prostatism 09/13/2016   Fatigue 08/08/2015   Peripheral edema 04/18/2015   Chronic low back pain 01/16/2015   Left leg pain 07/13/2014   Left leg swelling 07/13/2014   Renal insufficiency 07/13/2014   Nocturia 04/05/2014   Increased prostate specific antigen (PSA) velocity 04/05/2014   Hives 12/31/2013   Smoker 12/31/2013   S/P cervical spinal fusion 09/23/2013   Hyperlipidemia 08/20/2013   Muscle cramps 03/11/2013   Chronic pain 08/22/2012   Weight loss 01/03/2012   Anemia associated with acute blood loss 12/18/2011   Hyponatremia 12/18/2011   Osteoarthritis of left hip 08/27/2011   Cervical stenosis of spine 08/27/2011   Degenerative arthritis of hip 08/27/2011   Cervical radicular pain 08/27/2011   Encounter for well adult exam with abnormal findings 04/30/2011   THYROID NODULE, RIGHT 03/02/2010   SPINAL STENOSIS, CERVICAL 08/08/2009   BACK PAIN 07/31/2009   Acute prostatitis 05/23/2009   PAIN IN JOINT PELVIC REGION AND THIGH 05/09/2009   OTHER&UNSPECIFIED DISEASES THE ORAL SOFT TISSUES 01/30/2009   Pain in joint, lower leg 01/30/2009   Adjustment disorder with depressed mood 08/30/2008   GLAUCOMA ASSOCIATED WITH OCULAR DISORDER 08/30/2008   Essential hypertension 08/30/2008   Past Medical History:  Diagnosis Date   Chronic back pain    upper to lower   Depression    GERD (gastroesophageal reflux disease)    Glaucoma, both eyes    H/O: substance abuse ([place])    03-21-2021   pt stated hx of ETOH/Crack cocaine-none since 12/2012 per pt/Does not Drive due to this   History of CVA (cerebrovascular accident) without residual deficits 10/26/2018   right BG/ CR infarct secondart to moderate SVD,  no residual   History of hyperthyroidism 2011   s/p RAI  06-20-2010,  followed by pcp   History of prostatitis 2010   HLD (hyperlipidemia)    Hypertension    followed by pcp    (nuclear  stress test in epic 08-30-2013  normal no ischemia , ef 50%)   Legally blind in left eye, as defined in [place]    Blind Left eye, small amt vision Right eye   Lumbar radiculopathy    OA (osteoarthritis)    Prostate cancer [facility]) urologist--- dr Louis Meckel   dx 11-21-2020, Stage T1c, Gleason 4+3   Right thyroid nodule    Seasonal allergies    Spondylolisthesis, lumbar region    Wears dentures    upper full and lower partial   Wears glasses     Family History  Problem Relation Age of Onset  Alcohol abuse Father    Stroke Father    Heart disease Father    Arthritis Mother    Hyperlipidemia Mother    Goiter Mother        resection of benign goiter   Hypertension Sister    Diabetes Sister    Goiter Sister        resection of benign goiter   Cancer Sister        oldest.type unknown.   Diabetes Brother    Cancer Brother        oldest.type unknown.   Alcohol abuse Brother    Alcohol abuse Cousin    Heart disease Other        Aunt   Colon cancer Neg Hx    Colon polyps Neg Hx    Esophageal cancer Neg Hx    Rectal cancer Neg Hx    Stomach cancer Neg Hx    Breast cancer Neg Hx    Prostate cancer Neg Hx    Pancreatic cancer Neg Hx     Past Surgical History:  Procedure Laterality Date   ANTERIOR CERVICAL DECOMP/DISCECTOMY FUSION  10/11/2011   Procedure: ANTERIOR CERVICAL DECOMPRESSION/DISCECTOMY FUSION 3 LEVELS;  Surgeon: Eustace Moore;  Location: [facility];  Service: Neurosurgery;  Laterality: N/A;  Cervical three-four ,cervical four five cervical five six Anterior Cervical Decompression Fusion with peek + plate Nuvasive translational plate Orthofix peek (2 1/2 hours) Rm # [id number]   ANTERIOR CERVICAL DECOMP/DISCECTOMY FUSION N/A 07/29/2014   Procedure: ANTERIOR CERVICAL DECOMPRESSION/DISCECTOMY FUSION 1 LEVEL/HARDWARE REMOVAL;  Surgeon: Eustace Moore, MD;  Location: [facility];  Service: Neurosurgery;  Laterality: N/A;  cervical six-seven   COLONOSCOPY     COLONOSCOPY  lastone  07-12-2020   CYSTOSCOPY N/A 03/23/2021   Procedure: CYSTOSCOPY FLEXIBLE;  Surgeon: [name], MD;  [facility];  Service: Urology;  Laterality: N/A;   HAND SURGERY Right x2  12/ 2007   I & D abscess   HARDWARE REMOVAL N/A 09/11/2020   Procedure: Removal of hardware LUmbar Four-Lumbar Five;  Surgeon: Eustace Moore, MD;  Location: [facility];  Service: Neurosurgery;  Laterality: N/A;   LAMINECTOMY WITH POSTERIOR LATERAL ARTHRODESIS LEVEL 2 N/A 09/11/2020   Procedure: Laminectomy and Foraminotomy - Lumbar Three-Lumbar Four, posterior lateral fusion Lumbar Three-Four and Lumbar Four- Five;  Surgeon: Eustace Moore, MD;  Location: [facility];  Service: Neurosurgery;  Laterality: N/A;   POSTERIOR CERVICAL FUSION/FORAMINOTOMY N/A 09/23/2013   Procedure: CERVICALTWO TO CERVICAL SEVEN POSTERIOR CERVICAL FUSION/FORAMINOTOMY WITH LATERAL MASS FIXATION;  Surgeon: Eustace Moore, MD;  Location: [facility];  Service: Neurosurgery;  Laterality: N/A;   POSTERIOR LUMBAR FUSION  11-27-2017  @[facility]    L4--5   RADIOACTIVE SEED IMPLANT N/A 03/23/2021   Procedure: RADIOACTIVE SEED IMPLANT/BRACHYTHERAPY IMPLANT;  Surgeon: [name], MD;  Location[facility];  Service: Urology;  Laterality: N/A;   SPACE OAR INSTILLATION N/A 03/23/2021   Procedure: SPACE OAR INSTILLATION;  Surgeon: [name], MD;  Location: [facility];  Service: Urology;  Laterality: N/A;   TOTAL HIP ARTHROPLASTY  12/17/2011   Procedure: TOTAL HIP ARTHROPLASTY;  Surgeon: Johnny Bridge, MD;  Location: [facility];  Service: Orthopedics;  Laterality: Left;   Social History   Occupational History   Occupation: [employer]  Tobacco Use   Smoking status: Every Day    Packs/day: 0.75    Years: 47.00    Pack years: 35.25  Types: Cigarettes   Smokeless tobacco: Never   Tobacco comments:    since age 70  Vaping Use   Vaping Use: Never used  Substance and Sexual Activity   Alcohol use: No     Alcohol/week: 0.0 standard drinks    Comment: 03-21-2021 pt stated hx of alcohol abuse quit 2014   Drug use: Not Currently    Types: "Crack" cocaine    Comment: 03-21-2021 pt stated last use 12/2012   Sexual activity: Not Currently

## 2021-04-01 ENCOUNTER — Encounter: Payer: Self-pay | Admitting: Internal Medicine

## 2021-04-01 DIAGNOSIS — R739 Hyperglycemia, unspecified: Secondary | ICD-10-CM | POA: Insufficient documentation

## 2021-04-01 DIAGNOSIS — M79644 Pain in right finger(s): Secondary | ICD-10-CM | POA: Insufficient documentation

## 2021-04-01 NOTE — Assessment & Plan Note (Signed)
Mild to mod, worsening persistent, for hand surgury referral

## 2021-04-01 NOTE — Assessment & Plan Note (Signed)
Lab Results  Component Value Date   LDLCALC 64 05/02/2020   Stable, pt to continue current statin lipitor 40

## 2021-04-01 NOTE — Assessment & Plan Note (Signed)
Counseled to quit pt not ready

## 2021-04-01 NOTE — Assessment & Plan Note (Signed)
Lab Results  Component Value Date   HGBA1C 5.5 05/03/2019   Stable, pt to continue current medical treatment  diet

## 2021-04-01 NOTE — Assessment & Plan Note (Signed)
Uncontrolled mild elevated, pt declines med change for now, and will re start monitoring more closely at home, to f/u any worsening symptoms or concerns

## 2021-04-01 NOTE — Assessment & Plan Note (Signed)
D/ pt, to continue liiptor 40, low chol diet and excercise

## 2021-04-10 NOTE — Progress Notes (Signed)
  Radiation Oncology         [phone] ________________________________  Name: Mark Dunlap MRN: [id number]  Date: 04/13/2021  DOB: 01-Nov-1960  COMPLEX SIMULATION NOTE  NARRATIVE:  The patient was brought to the [facility] today following prostate seed implantation approximately one month ago.  Identity was confirmed.  All relevant records and images related to the planned course of therapy were reviewed.  Then, the patient was set-up supine.  CT images were obtained.  The CT images were loaded into the planning software.  Then the prostate and rectum were contoured.  Treatment planning then occurred.  The implanted iodine 125 seeds were identified by the physics staff for projection of radiation distribution  I have requested : 3D Simulation  I have requested a DVH of the following structures: Prostate and rectum.    ________________________________  Sheral Apley Tammi Klippel, M.D.

## 2021-04-12 ENCOUNTER — Telehealth: Payer: Self-pay | Admitting: *Deleted

## 2021-04-12 NOTE — Telephone Encounter (Signed)
CALLED PATIENT TO REMIND OF POST SEED APPTS. FOR 04-13-21, SPOKE WITH PATIENT AND HE IS AWARE OF THESE APPTS.

## 2021-04-12 NOTE — Progress Notes (Signed)
Radiation Oncology         [phone] ________________________________  Name: Mark Dunlap MRN: [id number]  Date: 04/13/2021  DOB: [birth year]  Post-Seed Follow-Up Visit Note  CC: Biagio Borg, MD  Biagio Borg, MD  Diagnosis:   60 y.o. gentleman with Stage T1c adenocarcinoma of the prostate with Gleason score of 4+3, and PSA of 5.09.    ICD-10-CM   1. Malignant neoplasm of prostate (HCC)  C61       Interval Since Last Radiation:  3 weeks 03/23/21:  Insertion of radioactive I-125 seeds into the prostate gland; 145 Gy, definitive therapy with placement of SpaceOAR Vue gel.  Narrative:  The patient returns today for routine follow-up.  He is complaining of increased urinary frequency and urinary hesitation symptoms. He filled out a questionnaire regarding urinary function today providing and overall IPSS score of 6 characterizing his symptoms as mild with increased frequency.  His pre-implant score was 8. He reports having some loose stools in the first week following the procedure but currently denies any abdominal pain or bowel symptoms.  He reports decreased appetite but this has been present since his mother passed recently.  He is eating 3 meals a day and maintaining his weight.  He denies any significant impact on his energy level and has been able to continue working full-time and staying active.  Overall, he is quite pleased with his progress to date.  ALLERGIES:  is allergic to tramadol, morphine and related, and oxycodone.  Meds: Current Outpatient Medications  Medication Sig Dispense Refill   adapalene (DIFFERIN) 0.1 % gel APPLY 1 APPLICATION TOPICALLY AT BEDTIME. 45 g 1   aspirin EC 81 MG tablet Take 81 mg by mouth daily. Swallow whole.     atorvastatin (LIPITOR) 40 MG tablet TAKE 1 TABLET BY MOUTH ONCE DAILY AT  6  PM 90 tablet 3   bimatoprost (LUMIGAN) 0.01 % SOLN Place 1 drop into both eyes at bedtime.     citalopram (CELEXA) 20 MG tablet Take 1 tablet (20 mg total)  by mouth daily. 90 tablet 3   clopidogrel (PLAVIX) 75 MG tablet Take 1 tablet (75 mg total) by mouth daily. 90 tablet 3   diazepam (VALIUM) 10 MG tablet Take 10 mg by mouth as needed.     diphenhydrAMINE (BENADRYL) 25 mg capsule Take 50 mg by mouth every 6 (six) hours as needed for itching.      famotidine (PEPCID) 40 MG tablet Take 1 tablet (40 mg total) by mouth 2 (two) times daily. Take every morning and at bedtime 60 tablet 3   fluticasone (FLONASE) 50 MCG/ACT nasal spray USE 2 SPRAYS DAILY IN BOTH NOSTRILS 32 mL 0   gabapentin (NEURONTIN) 100 MG capsule Take 200 mg by mouth 2 (two) times daily.  2   hydrochlorothiazide (MICROZIDE) 12.5 MG capsule TAKE 1 CAPSULE BY MOUTH EVERY DAY (Patient taking differently: Take 12.5 mg by mouth daily. TAKE 1 CAPSULE BY MOUTH EVERY DAY) 90 capsule 1   HYDROcodone-acetaminophen (NORCO) 10-325 MG tablet Take 1 tablet by mouth every 6 (six) hours as needed for moderate pain. 30 tablet 0   imiquimod (ALDARA) 5 % cream APPLY 3 TIMES A WEEK 24 each 0   lidocaine (XYLOCAINE) 2 % solution Use as directed 15 mLs in the mouth or throat daily as needed (acid reflux). 100 mL 2   Multiple Vitamins-Minerals (ONE DAILY FOR MEN/LYCOPENE) TABS Take 1 tablet by mouth daily.     nortriptyline (PAMELOR)  25 MG capsule Take 25 mg by mouth at bedtime.  0   oxybutynin (DITROPAN) 5 MG tablet TAKE 1 TABLET BY MOUTH AT BEDTIME (Patient taking differently: Take 5 mg by mouth at bedtime.) 90 tablet 0   pantoprazole (PROTONIX) 40 MG tablet Take 1 tablet (40 mg total) by mouth daily. 90 tablet 3   sildenafil (VIAGRA) 100 MG tablet Take 100 mg by mouth daily.     sucralfate (CARAFATE) 1 g tablet Take 1 tablet (1 g total) by mouth 3 (three) times daily. Take 20 minutes after meals and at bedtime 120 tablet 1   tamsulosin (FLOMAX) 0.4 MG CAPS capsule Take 1 capsule by mouth twice daily (Patient taking differently: Take 0.4 mg by mouth 2 (two) times daily. Take 1 capsule by mouth twice daily)  180 capsule 3   timolol (TIMOPTIC) 0.5 % ophthalmic solution Place 1 drop into both eyes daily.  6   tiZANidine (ZANAFLEX) 4 MG tablet Take 4 mg by mouth every 8 (eight) hours as needed for muscle spasms.      tretinoin (RETIN-A) 0.1 % cream Apply 1 application topically at bedtime. 45 g 2   zolpidem (AMBIEN) 10 MG tablet Take 1 tablet (10 mg total) by mouth at bedtime as needed. for sleep 90 tablet 1   No current facility-administered medications for this visit.    Physical Findings: In general this is a well appearing African-American male in no acute distress. He's alert and oriented x4 and appropriate throughout the examination. Cardiopulmonary assessment is negative for acute distress and he exhibits normal effort.   Lab Findings: Lab Results  Component Value Date   WBC 6.4 03/21/2021   HGB 11.8 (L) 03/21/2021   HCT 39.7 03/21/2021   MCV 85.0 03/21/2021   PLT 397 03/21/2021    Radiographic Findings:  Patient underwent CT imaging in our clinic for post implant dosimetry. The CT will be reviewed by Dr. Tammi Klippel to confirm there is an adequate distribution of radioactive seeds throughout the prostate gland and ensure that there are no seeds in or near the rectum. We suspect the final radiation plan and dosimetry will show appropriate coverage of the prostate gland. He understands that we will call and inform him of any unexpected findings on further review of his imaging and dosimetry.  Impression/Plan: 60 y.o. gentleman with Stage T1c adenocarcinoma of the prostate with Gleason score of 4+3, and PSA of 5.09. The patient is recovering from the effects of radiation. His urinary symptoms should gradually improve over the next 4-6 months. We talked about this today. He is encouraged by his improvement already and is otherwise pleased with his outcome. We also talked about long-term follow-up for prostate cancer following seed implant. He understands that ongoing PSA determinations and digital  rectal exams will help perform surveillance to rule out disease recurrence. He has a follow up appointment scheduled with Daine Gravel, NP at 3 PM today and then will see Dr. Jeffie Pollock in approximately 3 months for his first posttreatment PSA. He understands what to expect with his PSA measures and is anxious to see the results. Patient was also educated today about some of the long-term effects from radiation including a small risk for rectal bleeding and possibly erectile dysfunction. We talked about some of the general management approaches to these potential complications. However, I did encourage the patient to contact our office or return at any point if he has questions or concerns related to his previous radiation and prostate cancer.  [name], PA-C

## 2021-04-13 ENCOUNTER — Ambulatory Visit
Admission: RE | Admit: 2021-04-13 | Discharge: 2021-04-13 | Disposition: A | Discharge status: Home / Self Care | Payer: Medicare Other | Source: Ambulatory Visit | Attending: Urology | Admitting: Urology

## 2021-04-13 ENCOUNTER — Ambulatory Visit
Admission: RE | Admit: 2021-04-13 | Discharge: 2021-04-13 | Disposition: A | Discharge status: Home / Self Care | Payer: Medicare Other | Source: Ambulatory Visit | Attending: Radiation Oncology | Admitting: Radiation Oncology

## 2021-04-13 ENCOUNTER — Encounter: Payer: Self-pay | Admitting: Internal Medicine

## 2021-04-13 ENCOUNTER — Other Ambulatory Visit: Payer: Self-pay

## 2021-04-13 ENCOUNTER — Other Ambulatory Visit (INDEPENDENT_AMBULATORY_CARE_PROVIDER_SITE_OTHER): Payer: Medicare Other

## 2021-04-13 VITALS — BP 133/85 | HR 76 | Temp 97.8°F | Resp 20 | Ht 72.0 in | Wt 169.5 lb

## 2021-04-13 VITALS — BP 133/85 | HR 76 | Temp 97.8°F | Resp 20

## 2021-04-13 DIAGNOSIS — Z0001 Encounter for general adult medical examination with abnormal findings: Secondary | ICD-10-CM

## 2021-04-13 DIAGNOSIS — C61 Malignant neoplasm of prostate: Secondary | ICD-10-CM | POA: Diagnosis not present

## 2021-04-13 DIAGNOSIS — E559 Vitamin D deficiency, unspecified: Secondary | ICD-10-CM

## 2021-04-13 DIAGNOSIS — E538 Deficiency of other specified B group vitamins: Secondary | ICD-10-CM | POA: Diagnosis not present

## 2021-04-13 LAB — HEPATIC FUNCTION PANEL
ALT: 14 U/L (ref 0–53)
AST: 15 U/L (ref 0–37)
Albumin: 4 g/dL (ref 3.5–5.2)
Alkaline Phosphatase: 121 U/L — ABNORMAL HIGH (ref 39–117)
Bilirubin, Direct: 0.1 mg/dL (ref 0.0–0.3)
Total Bilirubin: 0.5 mg/dL (ref 0.2–1.2)
Total Protein: 7.2 g/dL (ref 6.0–8.3)

## 2021-04-13 LAB — CBC WITH DIFFERENTIAL/PLATELET
Basophils Absolute: 0.1 10*3/uL (ref 0.0–0.1)
Basophils Relative: 0.9 % (ref 0.0–3.0)
Eosinophils Absolute: 0.4 10*3/uL (ref 0.0–0.7)
Eosinophils Relative: 4.1 % (ref 0.0–5.0)
HCT: 35.3 % — ABNORMAL LOW (ref 39.0–52.0)
Hemoglobin: 11.3 g/dL — ABNORMAL LOW (ref 13.0–17.0)
Lymphocytes Relative: 21.9 % (ref 12.0–46.0)
Lymphs Abs: 2 10*3/uL (ref 0.7–4.0)
MCHC: 32 g/dL (ref 30.0–36.0)
MCV: 79.9 fl (ref 78.0–100.0)
Monocytes Absolute: 1.1 10*3/uL — ABNORMAL HIGH (ref 0.1–1.0)
Monocytes Relative: 12.5 % — ABNORMAL HIGH (ref 3.0–12.0)
Neutro Abs: 5.4 10*3/uL (ref 1.4–7.7)
Neutrophils Relative %: 60.6 % (ref 43.0–77.0)
Platelets: 389 10*3/uL (ref 150.0–400.0)
RBC: 4.42 Mil/uL (ref 4.22–5.81)
RDW: 18.1 % — ABNORMAL HIGH (ref 11.5–15.5)
WBC: 9 10*3/uL (ref 4.0–10.5)

## 2021-04-13 LAB — URINALYSIS, ROUTINE W REFLEX MICROSCOPIC
Bilirubin Urine: NEGATIVE
Ketones, ur: NEGATIVE
Leukocytes,Ua: NEGATIVE
Nitrite: NEGATIVE
Specific Gravity, Urine: <=1.005 — AB (ref 1.000–1.030)
Total Protein, Urine: NEGATIVE
Urine Glucose: NEGATIVE
Urobilinogen, UA: 0.2 (ref 0.0–1.0)
pH: 5.5 (ref 5.0–8.0)

## 2021-04-13 LAB — BASIC METABOLIC PANEL
BUN: 8 mg/dL (ref 6–23)
CO2: 29 mEq/L (ref 19–32)
Calcium: 9.1 mg/dL (ref 8.4–10.5)
Chloride: 100 mEq/L (ref 96–112)
Creatinine, Ser: 1.04 mg/dL (ref 0.40–1.50)
GFR: 77.68 mL/min (ref >60.00)
Glucose, Bld: 89 mg/dL (ref 70–99)
Potassium: 3.5 mEq/L (ref 3.5–5.1)
Sodium: 137 mEq/L (ref 135–145)

## 2021-04-13 LAB — LIPID PANEL
Cholesterol: 124 mg/dL (ref 0–200)
HDL: 48 mg/dL (ref >39.00)
LDL Cholesterol: 53 mg/dL (ref 0–99)
NonHDL: 75.93
Total CHOL/HDL Ratio: 3
Triglycerides: 114 mg/dL (ref 0.0–149.0)
VLDL: 22.8 mg/dL (ref 0.0–40.0)

## 2021-04-13 LAB — TSH: TSH: 1.04 u[IU]/mL (ref 0.35–5.50)

## 2021-04-13 LAB — VITAMIN D 25 HYDROXY (VIT D DEFICIENCY, FRACTURES): VITD: 31.45 ng/mL (ref 30.00–100.00)

## 2021-04-13 LAB — VITAMIN B12: Vitamin B-12: 455 pg/mL (ref 211–911)

## 2021-04-13 NOTE — Progress Notes (Signed)
[name] reports urinary frequency (states while at work he voids about every 1.5-2 hours). Reports nocturia x2, and then having to void at least 2 more times after he's awake before he leaves for work. Denies any hematuria or dysuria. Denies having to push or strain to start his stream, reports a moderate to strong urinary stream, and feels he is able to fully empty his bladder. Denies any current issues with diarrhea or constipation (though he did have constipation after surgery which he treated with a laxative that cause diarrhea--states he has returned to regular/daily bowel movements). Denies any issues with nausea or changes in appetite. Reports he would be pleased with his current urinary condition if he had to live with it for the rest of his life  IPSS Score: 6  Wt Readings from Last 3 Encounters:  04/13/21 169 lb 8 oz (76.9 kg)  03/28/21 167 lb (75.8 kg)  03/23/21 165 lb 9.6 oz (75.1 kg)   Vitals:   04/13/21 1105  BP: 133/85  Pulse: 76  Resp: 20  Temp: 97.8 F (36.6 C)  SpO2: 100%

## 2021-05-04 ENCOUNTER — Encounter: Payer: Self-pay | Admitting: Radiation Oncology

## 2021-05-04 DIAGNOSIS — C61 Malignant neoplasm of prostate: Secondary | ICD-10-CM | POA: Diagnosis not present

## 2021-05-06 NOTE — Progress Notes (Signed)
  Radiation Oncology         [phone] ________________________________  Name: [name] MRN: [id number]  Date: 05/04/2021  DOB: [birth year]  3D Planning Note   Prostate Brachytherapy Post-Implant Dosimetry  Diagnosis: 60 y.o. gentleman with Stage T1c adenocarcinoma of the prostate with Gleason score of 4+3, and PSA of 5.09  Narrative: On a previous date, Mark Dunlap returned following prostate seed implantation for post implant planning. He underwent CT scan complex simulation to delineate the three-dimensional structures of the pelvis and demonstrate the radiation distribution.  Since that time, the seed localization, and complex isodose planning with dose volume histograms have now been completed.  Results:   Prostate Coverage - The dose of radiation delivered to the 90% or more of the prostate gland (D90) was 113.62% of the prescription dose. This exceeds our goal of greater than 90%. Rectal Sparing - The volume of rectal tissue receiving the prescription dose or higher was 0.0 cc. This falls under our thresholds tolerance of 1.0 cc.  Impression: The prostate seed implant appears to show adequate target coverage and appropriate rectal sparing.  Plan:  The patient will continue to follow with urology for ongoing PSA determinations. I would anticipate a high likelihood for local tumor control with minimal risk for rectal morbidity.  ________________________________  Sheral Apley Tammi Klippel, M.D.

## 2021-05-16 ENCOUNTER — Other Ambulatory Visit: Payer: Self-pay

## 2021-05-16 ENCOUNTER — Ambulatory Visit (INDEPENDENT_AMBULATORY_CARE_PROVIDER_SITE_OTHER): Payer: Medicare Other

## 2021-05-16 VITALS — BP 140/78 | HR 73 | Temp 98.2°F | Ht 75.0 in | Wt 171.0 lb

## 2021-05-16 DIAGNOSIS — Z0000 Encounter for general adult medical examination without abnormal findings: Secondary | ICD-10-CM

## 2021-05-16 NOTE — Patient Instructions (Signed)
Mark Dunlap , Thank you for taking time to come for your Medicare Wellness Visit. I appreciate your ongoing commitment to your health goals. Please review the following plan we discussed and let me know if I can assist you in the future.   Screening recommendations/referrals: Colonoscopy: last done on 07/12/2020; due every 7 years Recommended yearly ophthalmology/optometry visit for glaucoma screening and checkup Recommended yearly dental visit for hygiene and checkup  Vaccinations: Influenza vaccine: 07/17/2020 Pneumococcal vaccine: 07/01/2019; need Prevnar 13 Tdap vaccine: 08/08/2015; due every 10 years Shingles vaccine: never done   Covid-19: 11/30/2019, 12/29/2019, 08/11/2020  Advanced directives: Advance directive discussed with you today. Even though you declined this today please call our office should you change your mind and we can give you the proper paperwork for you to fill out.  Conditions/risks identified: Yes; Client understands the importance of follow-up with providers by attending scheduled visits and discussed goals to eat healthier, increase physical activity, exercise the brain, socialize more, get enough sleep and make time for laughter.  Next appointment: Please schedule your next Medicare Wellness Visit with your Nurse Health Advisor in 1 year by calling [phone].  Preventive Care 40-64 Years, Male Preventive care refers to lifestyle choices and visits with your health care provider that can promote health and wellness. What does preventive care include? A yearly physical exam. This is also called an annual well check. Dental exams once or twice a year. Routine eye exams. Ask your health care provider how often you should have your eyes checked. Personal lifestyle choices, including: Daily care of your teeth and gums. Regular physical activity. Eating a healthy diet. Avoiding tobacco and drug use. Limiting alcohol use. Practicing safe sex. Taking low-dose  aspirin every day starting at age 9. What happens during an annual well check? The services and screenings done by your health care provider during your annual well check will depend on your age, overall health, lifestyle risk factors, and family history of disease. Counseling  Your health care provider may ask you questions about your: Alcohol use. Tobacco use. Drug use. Emotional well-being. Home and relationship well-being. Sexual activity. Eating habits. Work and work Statistician. Screening  You may have the following tests or measurements: Height, weight, and BMI. Blood pressure. Lipid and cholesterol levels. These may be checked every 5 years, or more frequently if you are over 57 years old. Skin check. Lung cancer screening. You may have this screening every year starting at age 72 if you have a 30-pack-year history of smoking and currently smoke or have quit within the past 15 years. Fecal occult blood test (FOBT) of the stool. You may have this test every year starting at age 33. Flexible sigmoidoscopy or colonoscopy. You may have a sigmoidoscopy every 5 years or a colonoscopy every 10 years starting at age 32. Prostate cancer screening. Recommendations will vary depending on your family history and other risks. Hepatitis C blood test. Hepatitis B blood test. Sexually transmitted disease (STD) testing. Diabetes screening. This is done by checking your blood sugar (glucose) after you have not eaten for a while (fasting). You may have this done every 1-3 years. Discuss your test results, treatment options, and if necessary, the need for more tests with your health care provider. Vaccines  Your health care provider may recommend certain vaccines, such as: Influenza vaccine. This is recommended every year. Tetanus, diphtheria, and acellular pertussis (Tdap, Td) vaccine. You may need a Td booster every 10 years. Zoster vaccine. You may need this after  age 4. Pneumococcal  13-valent conjugate (PCV13) vaccine. You may need this if you have certain conditions and have not been vaccinated. Pneumococcal polysaccharide (PPSV23) vaccine. You may need one or two doses if you smoke cigarettes or if you have certain conditions. Talk to your health care provider about which screenings and vaccines you need and how often you need them. This information is not intended to replace advice given to you by your health care provider. Make sure you discuss any questions you have with your health care provider. Document Released: 10/27/2015 Document Revised: 06/19/2016 Document Reviewed: 08/01/2015 Elsevier Interactive Patient Education  2017 [place] Prevention in the Home Falls can cause injuries. They can happen to people of all ages. There are many things you can do to make your home safe and to help prevent falls. What can I do on the outside of my home? Regularly fix the edges of walkways and driveways and fix any cracks. Remove anything that might make you trip as you walk through a door, such as a raised step or threshold. Trim any bushes or trees on the path to your home. Use bright outdoor lighting. Clear any walking paths of anything that might make someone trip, such as rocks or tools. Regularly check to see if handrails are loose or broken. Make sure that both sides of any steps have handrails. Any raised decks and porches should have guardrails on the edges. Have any leaves, snow, or ice cleared regularly. Use sand or salt on walking paths during winter. Clean up any spills in your garage right away. This includes oil or grease spills. What can I do in the bathroom? Use night lights. Install grab bars by the toilet and in the tub and shower. Do not use towel bars as grab bars. Use non-skid mats or decals in the tub or shower. If you need to sit down in the shower, use a plastic, non-slip stool. Keep the floor dry. Clean up any water that spills on the  floor as soon as it happens. Remove soap buildup in the tub or shower regularly. Attach bath mats securely with double-sided non-slip rug tape. Do not have throw rugs and other things on the floor that can make you trip. What can I do in the bedroom? Use night lights. Make sure that you have a light by your bed that is easy to reach. Do not use any sheets or blankets that are too big for your bed. They should not hang down onto the floor. Have a firm chair that has side arms. You can use this for support while you get dressed. Do not have throw rugs and other things on the floor that can make you trip. What can I do in the kitchen? Clean up any spills right away. Avoid walking on wet floors. Keep items that you use a lot in easy-to-reach places. If you need to reach something above you, use a strong step stool that has a grab bar. Keep electrical cords out of the way. Do not use floor polish or wax that makes floors slippery. If you must use wax, use non-skid floor wax. Do not have throw rugs and other things on the floor that can make you trip. What can I do with my stairs? Do not leave any items on the stairs. Make sure that there are handrails on both sides of the stairs and use them. Fix handrails that are broken or loose. Make sure that handrails are as long  as the stairways. Check any carpeting to make sure that it is firmly attached to the stairs. Fix any carpet that is loose or worn. Avoid having throw rugs at the top or bottom of the stairs. If you do have throw rugs, attach them to the floor with carpet tape. Make sure that you have a light switch at the top of the stairs and the bottom of the stairs. If you do not have them, ask someone to add them for you. What else can I do to help prevent falls? Wear shoes that: Do not have high heels. Have rubber bottoms. Are comfortable and fit you well. Are closed at the toe. Do not wear sandals. If you use a stepladder: Make sure that  it is fully opened. Do not climb a closed stepladder. Make sure that both sides of the stepladder are locked into place. Ask someone to hold it for you, if possible. Clearly mark and make sure that you can see: Any grab bars or handrails. First and last steps. Where the edge of each step is. Use tools that help you move around (mobility aids) if they are needed. These include: Canes. Walkers. Scooters. Crutches. Turn on the lights when you go into a dark area. Replace any light bulbs as soon as they burn out. Set up your furniture so you have a clear path. Avoid moving your furniture around. If any of your floors are uneven, fix them. If there are any pets around you, be aware of where they are. Review your medicines with your doctor. Some medicines can make you feel dizzy. This can increase your chance of falling. Ask your doctor what other things that you can do to help prevent falls. This information is not intended to replace advice given to you by your health care provider. Make sure you discuss any questions you have with your health care provider. Document Released: 07/27/2009 Document Revised: 03/07/2016 Document Reviewed: 11/04/2014 Elsevier Interactive Patient Education  2017 [employer].

## 2021-05-16 NOTE — Progress Notes (Signed)
Subjective:   Mark Dunlap is a 60 y.o. male who presents for Medicare Annual/Subsequent preventive examination.  Review of Systems     Cardiac Risk Factors include: advanced age (>39mn, >>50women);family history of premature cardiovascular disease;dyslipidemia;hypertension;male gender;smoking/ tobacco exposure     Objective:    Today's Vitals   05/16/21 1544  BP: 140/78  Pulse: 73  Temp: 98.2 F (36.8 C)  SpO2: 98%  Weight: 171 lb (77.6 kg)  Height: '6\' 3"'$  (1.905 m)  PainSc: 8   PainLoc: Back   Body mass index is 21.37 kg/m.  Advanced Directives 05/16/2021 03/23/2021 12/12/2020 09/11/2020 05/03/2019 10/26/2018 10/26/2018  Does Patient Have a Medical Advance Directive? No No No No No No No  Would patient like information on creating a medical advance directive? No - Patient declined No - Patient declined No - Patient declined No - Patient declined No - Patient declined No - Patient declined No - Patient declined  Pre-existing out of facility DNR order (yellow form or pink MOST form) - - - - - - -    Current Medications (verified) Outpatient Encounter Medications as of 05/16/2021  Medication Sig   adapalene (DIFFERIN) 0.1 % gel APPLY 1 APPLICATION TOPICALLY AT BEDTIME.   aspirin EC 81 MG tablet Take 81 mg by mouth daily. Swallow whole.   atorvastatin (LIPITOR) 40 MG tablet TAKE 1 TABLET BY MOUTH ONCE DAILY AT  6  PM   bimatoprost (LUMIGAN) 0.01 % SOLN Place 1 drop into both eyes at bedtime.   citalopram (CELEXA) 20 MG tablet Take 1 tablet (20 mg total) by mouth daily.   clopidogrel (PLAVIX) 75 MG tablet Take 1 tablet (75 mg total) by mouth daily.   diazepam (VALIUM) 10 MG tablet Take 10 mg by mouth as needed.   diphenhydrAMINE (BENADRYL) 25 mg capsule Take 50 mg by mouth every 6 (six) hours as needed for itching.    famotidine (PEPCID) 40 MG tablet Take 1 tablet (40 mg total) by mouth 2 (two) times daily. Take every morning and at bedtime   fluticasone (FLONASE) 50 MCG/ACT  nasal spray USE 2 SPRAYS DAILY IN BOTH NOSTRILS   gabapentin (NEURONTIN) 100 MG capsule Take 200 mg by mouth 2 (two) times daily.   hydrochlorothiazide (MICROZIDE) 12.5 MG capsule TAKE 1 CAPSULE BY MOUTH EVERY DAY (Patient taking differently: Take 12.5 mg by mouth daily. TAKE 1 CAPSULE BY MOUTH EVERY DAY)   HYDROcodone-acetaminophen (NORCO) 10-325 MG tablet Take 1 tablet by mouth every 6 (six) hours as needed for moderate pain.   imiquimod (ALDARA) 5 % cream APPLY 3 TIMES A WEEK   lidocaine (XYLOCAINE) 2 % solution Use as directed 15 mLs in the mouth or throat daily as needed (acid reflux).   Multiple Vitamins-Minerals (ONE DAILY FOR MEN/LYCOPENE) TABS Take 1 tablet by mouth daily.   nortriptyline (PAMELOR) 25 MG capsule Take 25 mg by mouth at bedtime.   oxybutynin (DITROPAN) 5 MG tablet TAKE 1 TABLET BY MOUTH AT BEDTIME (Patient taking differently: Take 5 mg by mouth at bedtime.)   pantoprazole (PROTONIX) 40 MG tablet Take 1 tablet (40 mg total) by mouth daily.   sildenafil (VIAGRA) 100 MG tablet Take 100 mg by mouth daily.   sucralfate (CARAFATE) 1 g tablet Take 1 tablet (1 g total) by mouth 3 (three) times daily. Take 20 minutes after meals and at bedtime   tamsulosin (FLOMAX) 0.4 MG CAPS capsule Take 1 capsule by mouth twice daily (Patient taking differently: Take 0.4 mg  by mouth 2 (two) times daily. Take 1 capsule by mouth twice daily)   timolol (TIMOPTIC) 0.5 % ophthalmic solution Place 1 drop into both eyes daily.   tiZANidine (ZANAFLEX) 4 MG tablet Take 4 mg by mouth every 8 (eight) hours as needed for muscle spasms.    tretinoin (RETIN-A) 0.1 % cream Apply 1 application topically at bedtime.   zolpidem (AMBIEN) 10 MG tablet Take 1 tablet (10 mg total) by mouth at bedtime as needed. for sleep   No facility-administered encounter medications on file as of 05/16/2021.    Allergies (verified) Tramadol, Morphine and related, and Oxycodone   History: Past Medical History:  Diagnosis Date    Chronic back pain    upper to lower   Depression    GERD (gastroesophageal reflux disease)    Glaucoma, both eyes    H/O: substance abuse ([place])    03-21-2021   pt stated hx of ETOH/Crack cocaine-none since 12/2012 per pt/Does not Drive due to this   History of CVA (cerebrovascular accident) without residual deficits 10/26/2018   right BG/ CR infarct secondart to moderate SVD,  no residual   History of hyperthyroidism 2011   s/p RAI  06-20-2010,  followed by pcp   History of prostatitis 2010   HLD (hyperlipidemia)    Hypertension    followed by pcp    (nuclear stress test in epic 08-30-2013  normal no ischemia , ef 50%)   Legally blind in left eye, as defined in [place]    Blind Left eye, small amt vision Right eye   Lumbar radiculopathy    OA (osteoarthritis)    Prostate cancer [facility]) urologist--- dr Louis Meckel   dx 11-21-2020, Stage T1c, Gleason 4+3   Right thyroid nodule    Seasonal allergies    Spondylolisthesis, lumbar region    Wears dentures    upper full and lower partial   Wears glasses    Past Surgical History:  Procedure Laterality Date   ANTERIOR CERVICAL DECOMP/DISCECTOMY FUSION  10/11/2011   Procedure: ANTERIOR CERVICAL DECOMPRESSION/DISCECTOMY FUSION 3 LEVELS;  Surgeon: Eustace Moore;  Location: [place] [facility];  Service: Neurosurgery;  Laterality: N/A;  Cervical three-four ,cervical four five cervical five six Anterior Cervical Decompression Fusion with peek + plate Nuvasive translational plate Orthofix peek (2 1/2 hours) Rm # [id number]   ANTERIOR CERVICAL DECOMP/DISCECTOMY FUSION N/A 07/29/2014   Procedure: ANTERIOR CERVICAL DECOMPRESSION/DISCECTOMY FUSION 1 LEVEL/HARDWARE REMOVAL;  Surgeon: Eustace Moore, MD;  Location: [place] [facility] OR[facility];  Service: Neurosurgery;  Laterality: N/A;  cervical six-seven   COLONOSCOPY     COLONOSCOPY  lastone 07-12-2020   CYSTOSCOPY N/A 03/23/2021   Procedure: CYSTOSCOPY FLEXIBLE;  Surgeon: [name], MD;  Location[facility];   Service: Urology;  Laterality: N/A;   HAND SURGERY Right x2  12/ 2007   I & D abscess   HARDWARE REMOVAL N/A 09/11/2020   Procedure: Removal of hardware LUmbar Four-Lumbar Five;  Surgeon: Eustace Moore, MD;  Location: [place];  Service: Neurosurgery;  Laterality: N/A;   LAMINECTOMY WITH POSTERIOR LATERAL ARTHRODESIS LEVEL 2 N/A 09/11/2020   Procedure: Laminectomy and Foraminotomy - Lumbar Three-Lumbar Four, posterior lateral fusion Lumbar Three-Four and Lumbar Four- Five;  Surgeon: Eustace Moore, MD;  Location: [place];  Service: Neurosurgery;  Laterality: N/A;   POSTERIOR CERVICAL FUSION/FORAMINOTOMY N/A 09/23/2013   Procedure: CERVICALTWO TO CERVICAL SEVEN POSTERIOR CERVICAL FUSION/FORAMINOTOMY WITH LATERAL MASS FIXATION;  Surgeon: Eustace Moore, MD;  Location: [facility];  Service: Neurosurgery;  Laterality: N/A;   POSTERIOR LUMBAR FUSION  11-27-2017  '@[facility]'$    L4--5   RADIOACTIVE SEED IMPLANT N/A 03/23/2021   Procedure: RADIOACTIVE SEED IMPLANT/BRACHYTHERAPY IMPLANT;  Surgeon: [name], MD;  Location: [facility];  Service: Urology;  Laterality: N/A;   SPACE OAR INSTILLATION N/A 03/23/2021   Procedure: SPACE OAR INSTILLATION;  Surgeon: [name], MD;  Location[facility];  Service: Urology;  Laterality: N/A;   TOTAL HIP ARTHROPLASTY  12/17/2011   Procedure: TOTAL HIP ARTHROPLASTY;  Surgeon: Johnny Bridge, MD;  Location: [place];  Service: Orthopedics;  Laterality: Left;   Family History  Problem Relation Age of Onset   Alcohol abuse Father    Stroke Father    Heart disease Father    Arthritis Mother    Hyperlipidemia Mother    Goiter Mother        resection of benign goiter   Hypertension Sister    Diabetes Sister    Goiter Sister        resection of benign goiter   Cancer Sister        oldest.type unknown.   Diabetes Brother    Cancer Brother        oldest.type unknown.   Alcohol abuse Brother    Alcohol abuse Cousin    Heart disease Other         Aunt   Colon cancer Neg Hx    Colon polyps Neg Hx    Esophageal cancer Neg Hx    Rectal cancer Neg Hx    Stomach cancer Neg Hx    Breast cancer Neg Hx    Prostate cancer Neg Hx    Pancreatic cancer Neg Hx    Social History   Socioeconomic History   Marital status: Single    Spouse name: Not on file   Number of children: 0   Years of education: Not on file   Highest education level: Not on file  Occupational History   Occupation: [employer]  Tobacco Use   Smoking status: Every Day    Packs/day: 0.75    Years: 47.00    Pack years: 35.25    Types: Cigarettes   Smokeless tobacco: Never   Tobacco comments:    since age 63  Vaping Use   Vaping Use: Never used  Substance and Sexual Activity   Alcohol use: No    Alcohol/week: 0.0 standard drinks    Comment: 03-21-2021 pt stated hx of alcohol abuse quit 2014   Drug use: Not Currently    Types: "Crack" cocaine    Comment: 03-21-2021 pt stated last use 12/2012   Sexual activity: Not Currently  Other Topics Concern   Not on file  Social History Narrative   Not on file   Social Determinants of Health   Financial Resource Strain: Low Risk    Difficulty of Paying Living Expenses: Not hard at all  Food Insecurity: No Food Insecurity   Worried About Charity fundraiser in the Last Year: Never true   Ran Out of Food in the Last Year: Never true  Transportation Needs: No Transportation Needs   Lack of Transportation (Medical): No   Lack of Transportation (Non-Medical): No  Physical Activity: Not on file  Stress: No Stress Concern Present   Feeling of Stress : Not at all  Social Connections: Moderately Integrated   Frequency of Communication with Friends and Family: More than three times a week  Frequency of Social Gatherings with Friends and Family: Once a week   Attends Religious Services: 1 to 4 times per year   Active Member of Clubs or Organizations: Patient refused   Attends Arts administrator: 1 to 4 times per year   Marital Status: Never married    Tobacco Counseling Ready to quit: Not Answered Counseling given: Not Answered Tobacco comments: since age 65   Clinical Intake:  Pre-visit preparation completed: Yes  Pain : 0-10 Pain Score: 8  Pain Type: Chronic pain Pain Location: Back Pain Orientation: Lower Pain Descriptors / Indicators: Discomfort Pain Frequency: Constant Effect of Pain on Daily Activities: Pain can diminish job performance, lower motivation to exercise, and prevent you from completing daily tasks. Pain produces disability and affects the quality of life.     BMI - recorded: 21.37 Nutritional Status: BMI of 19-24  Normal Nutritional Risks: None Diabetes: No  How often do you need to have someone help you when you read instructions, pamphlets, or other written materials from your doctor or pharmacy?: 1 - Never What is the last grade level you completed in school?: High School Graduate  Diabetic? no  Interpreter Needed?: No  Information entered by :: Lisette Abu, LPN   Activities of Daily Living In your present state of health, do you have any difficulty performing the following activities: 05/16/2021 03/23/2021  Hearing? N N  Vision? N N  Comment - has vision in right eye  Difficulty concentrating or making decisions? N N  Walking or climbing stairs? N N  Dressing or bathing? N N  Doing errands, shopping? N -  Preparing Food and eating ? N -  Using the Toilet? N -  In the past six months, have you accidently leaked urine? N -  Do you have problems with loss of bowel control? N -  Managing your Medications? N -  Managing your Finances? N -  Housekeeping or managing your Housekeeping? N -  Some recent data might be hidden    Patient Care Team: Biagio Borg, MD as PCP - General Eustace Moore, MD as Consulting Physician (Neurosurgery) Warden Fillers, MD as Consulting Physician (Ophthalmology) Cira Rue, RN as  Oncology Nurse Navigator [name], MD as Attending Physician (Urology) Tyler Pita, MD as Consulting Physician (Radiation Oncology)  Indicate any recent Medical Services you may have received from other than [facility] providers in the past year (date may be approximate).     Assessment:   This is a routine wellness examination for [place].  Hearing/Vision screen Hearing Screening - Comments:: Patient declined any hearing difficulty.  No hearing aids needed. Vision Screening - Comments:: Patient wears glasses.  Eye exam done once a year by Dr. Warden Fillers.  Dietary issues and exercise activities discussed: Current Exercise Habits: The patient has a physically strenuous job, but has no regular exercise apart from work., Exercise limited by: None identified   Goals Addressed   None   Depression Screen PHQ 2/9 Scores 05/16/2021 10/31/2020 10/31/2020 05/02/2020 11/05/2019 05/03/2019 12/08/2018  PHQ - 2 Score 1 0 0 0 0 2 0  PHQ- 9 Score - - 0 0 - 6 -    Fall Risk Fall Risk  05/16/2021 10/31/2020 11/05/2019 05/03/2019 08/21/2017  Falls in the past year? 0 0 0 1 No  Number falls in past yr: 0 - - 0 -  Injury with Fall? 0 - - 0 -  Risk for fall due to : No Fall Risks - -  Impaired vision -  Follow up Falls evaluation completed - - - -    FALL RISK PREVENTION PERTAINING TO THE HOME:  Any stairs in or around the home? No  If so, are there any without handrails? No  Home free of loose throw rugs in walkways, pet beds, electrical cords, etc? Yes  Adequate lighting in your home to reduce risk of falls? Yes   ASSISTIVE DEVICES UTILIZED TO PREVENT FALLS:  Life alert? No  Use of a cane, walker or w/c? No  Grab bars in the bathroom? No  Shower chair or bench in shower? No  Elevated toilet seat or a handicapped toilet? No   TIMED UP AND GO:  Was the test performed? Yes .  Length of time to ambulate 10 feet: 6 sec.   Gait steady and fast without use of assistive device  Cognitive  Function: Normal cognitive status assessed by direct observation by this Nurse Health Advisor. No abnormalities found.          Immunizations Immunization History  Administered Date(s) Administered   Influenza Split 08/20/2012   Influenza Whole 08/25/2008   Influenza,inj,Quad PF,6+ Mos 07/19/2013, 07/06/2014, 08/08/2015, 08/21/2017, 09/07/2018, 07/01/2019   Influenza-Unspecified 07/17/2020   Moderna Sars-Covid-2 Vaccination 11/30/2019, 12/29/2019, 08/11/2020   Pneumococcal Polysaccharide-23 07/30/2014, 07/01/2019   Td 09/15/2006   Tdap 08/08/2015    TDAP status: Up to date  Flu Vaccine status: Up to date  Pneumococcal vaccine status: Due, Education has been provided regarding the importance of this vaccine. Advised may receive this vaccine at local pharmacy or Health Dept. Aware to provide a copy of the vaccination record if obtained from local pharmacy or Health Dept. Verbalized acceptance and understanding.  Covid-19 vaccine status: Completed vaccines  Qualifies for Shingles Vaccine? Yes   Zostavax completed No   Shingrix Completed?: No.    Education has been provided regarding the importance of this vaccine. Patient has been advised to call insurance company to determine out of pocket expense if they have not yet received this vaccine. Advised may also receive vaccine at local pharmacy or Health Dept. Verbalized acceptance and understanding.  Screening Tests Health Maintenance  Topic Date Due   Zoster Vaccines- Shingrix (1 of 2) Never done   Pneumococcal Vaccine 74-84 Years old (3 - PCV) 06/30/2020   INFLUENZA VACCINE  05/14/2021   COVID-19 Vaccine (4 - Booster for Moderna series) 10/03/2021 (Originally 11/11/2020)   TETANUS/TDAP  08/07/2025   COLONOSCOPY (Pts 45-26yr Insurance coverage will need to be confirmed)  07/13/2027   Hepatitis C Screening  Completed   HIV Screening  Completed   HPV VACCINES  Aged Out    Health Maintenance  Health Maintenance Due  Topic Date  Due   Zoster Vaccines- Shingrix (1 of 2) Never done   Pneumococcal Vaccine 012659Years old (3 - PCV) 06/30/2020   INFLUENZA VACCINE  05/14/2021    Colorectal cancer screening: Type of screening: Colonoscopy. Completed 07/12/2020. Repeat every 7 years  Lung Cancer Screening: (Low Dose CT Chest recommended if Age 61-80years, 30 pack-year currently smoking OR have quit w/in 15years.) does qualify.   Lung Cancer Screening Referral: no  Additional Screening:  Hepatitis C Screening: does qualify; Completed: yes  Vision Screening: Recommended annual ophthalmology exams for early detection of glaucoma and other disorders of the eye. Is the patient up to date with their annual eye exam?  Yes  Who is the provider or what is the name of the office in which the patient attends annual  eye exams? Warden Fillers, MD. If pt is not established with a provider, would they like to be referred to a provider to establish care? No .   Dental Screening: Recommended annual dental exams for proper oral hygiene  Community Resource Referral / Chronic Care Management: CRR required this visit?  No   CCM required this visit?  No      Plan:     I have personally reviewed and noted the following in the patient's chart:   Medical and social history Use of alcohol, tobacco or illicit drugs  Current medications and supplements including opioid prescriptions. Patient is currently taking opioid prescriptions. Information provided to patient regarding non-opioid alternatives. Patient advised to discuss non-opioid treatment plan with their provider. Functional ability and status Nutritional status Physical activity Advanced directives List of other physicians Hospitalizations, surgeries, and ER visits in previous 12 months Vitals Screenings to include cognitive, depression, and falls Referrals and appointments  In addition, I have reviewed and discussed with patient certain preventive protocols, quality  metrics, and best practice recommendations. A written personalized care plan for preventive services as well as general preventive health recommendations were provided to patient.     Sheral Flow, LPN   [id number]   Nurse Notes: n/a

## 2021-05-21 ENCOUNTER — Other Ambulatory Visit: Payer: Self-pay | Admitting: Internal Medicine

## 2021-05-23 ENCOUNTER — Other Ambulatory Visit: Payer: Self-pay | Admitting: Internal Medicine

## 2021-05-23 NOTE — Telephone Encounter (Signed)
Please refill as per office routine med refill policy (all routine meds refilled for 3 mo or monthly per pt preference up to one year from last visit, then month to month grace period for 3 mo, then further med refills will have to be denied)  

## 2021-05-23 NOTE — Telephone Encounter (Signed)
-----   Message from [place], [place] sent at 05/23/2021  9:39 AM EDT ----- Regarding: FW: Refills  ----- Message ----- From: Sheral Flow, LPN Sent: [id number]   4:24 PM EDT To: Shirlyn Goltz, CMA Subject: Refills                                        Can you forward this to Dr. Jenny Reichmann:  Patient is requesting a refill on Lidocaine.  He is requesting a Qty 450 mL to be sent to [facility].  Thanks,  [employer]

## 2021-05-23 NOTE — Telephone Encounter (Signed)
Very sorry, but repeated lidocaine refills is very unusual and not meant to be an ongoing chronic treatment  His last EGD sept 2020 did not show significant problem enough to warrant contd refills, such as large esophagus ulcers or cancer  I can refer back to GI if he likes, but I am not comfortable with more refills as this is not a usual treatment for reflux

## 2021-05-30 ENCOUNTER — Ambulatory Visit: Payer: Medicare Other | Admitting: Podiatry

## 2021-05-30 ENCOUNTER — Other Ambulatory Visit: Payer: Self-pay

## 2021-05-30 DIAGNOSIS — M19071 Primary osteoarthritis, right ankle and foot: Secondary | ICD-10-CM | POA: Diagnosis not present

## 2021-05-30 DIAGNOSIS — M19072 Primary osteoarthritis, left ankle and foot: Secondary | ICD-10-CM

## 2021-06-01 NOTE — Progress Notes (Signed)
Subjective:  Patient ID: Mark Dunlap, male    DOB: 1961/10/12,  MRN: [id number]  Chief Complaint  Patient presents with   Arthritis    PT stated that he would like another injection    60 y.o. male presents with the above complaint.  Patient is here for follow-up of bilateral first metatarsophalangeal joint pain with severe arthrosis.  He is due for his 66-monthsteroid injection as it does continue to give him relief for many months.  I will plan on doing surgery once he is ready for it.  Or if the steroid shot stops working   Review of Systems: Negative except as noted in the HPI. Denies N/V/F/Ch.  Past Medical History:  Diagnosis Date   Chronic back pain    upper to lower   Depression    GERD (gastroesophageal reflux disease)    Glaucoma, both eyes    [place]    [place]   pt stated hx of ETOH/Crack cocaine-none since 12/2012 per pt/Does not Drive due to this   History of CVA (cerebrovascular accident) without residual deficits 10/26/2018   right BG/ CR infarct secondart to moderate SVD,  no residual   History of hyperthyroidism 2011   s/p RAI  06-20-2010,  followed by pcp   History of prostatitis 2010   HLD (hyperlipidemia)    Hypertension    followed by pcp    (nuclear stress test in epic 08-30-2013  normal no ischemia , ef 50%)   [place] in [place]   Blind Left eye, small amt vision Right eye   Lumbar radiculopathy    OA (osteoarthritis)    Prostate cancer ([facility] urologist--- dr hLouis Meckel  dx 11-21-2020, Stage T1c, Gleason 4+3   Right thyroid nodule    Seasonal allergies    Spondylolisthesis, lumbar region    Wears dentures    upper full and lower partial   Wears glasses     Current Outpatient Medications:    adapalene (DIFFERIN) 0.1 % gel, APPLY 1 APPLICATION TOPICALLY AT BEDTIME., Disp: 45 g, Rfl: 1   aspirin EC 81 MG tablet, Take 81 mg by mouth daily. Swallow whole., Disp: , Rfl:    atorvastatin (LIPITOR) 40  MG tablet, TAKE 1 TABLET BY MOUTH ONCE DAILY AT  6  PM, Disp: 90 tablet, Rfl: 3   bimatoprost (LUMIGAN) 0.01 % SOLN, Place 1 drop into both eyes at bedtime., Disp: , Rfl:    citalopram (CELEXA) 20 MG tablet, Take 1 tablet by mouth once daily, Disp: 90 tablet, Rfl: 3   clopidogrel (PLAVIX) 75 MG tablet, Take 1 tablet by mouth once daily, Disp: 90 tablet, Rfl: 3   diazepam (VALIUM) 10 MG tablet, Take 10 mg by mouth as needed., Disp: , Rfl:    diphenhydrAMINE (BENADRYL) 25 mg capsule, Take 50 mg by mouth every 6 (six) hours as needed for itching. , Disp: , Rfl:    famotidine (PEPCID) 40 MG tablet, Take 1 tablet (40 mg total) by mouth 2 (two) times daily. Take every morning and at bedtime, Disp: 60 tablet, Rfl: 3   fluticasone (FLONASE) 50 MCG/ACT nasal spray, USE 2 SPRAYS DAILY IN BOTH NOSTRILS, Disp: 32 mL, Rfl: 0   gabapentin (NEURONTIN) 100 MG capsule, Take 200 mg by mouth 2 (two) times daily., Disp: , Rfl: 2   hydrochlorothiazide (MICROZIDE) 12.5 MG capsule, TAKE 1 CAPSULE BY MOUTH EVERY DAY (Patient taking differently: Take 12.5 mg by mouth daily.  TAKE 1 CAPSULE BY MOUTH EVERY DAY), Disp: 90 capsule, Rfl: 1   HYDROcodone-acetaminophen (NORCO) 10-325 MG tablet, Take 1 tablet by mouth every 6 (six) hours as needed for moderate pain., Disp: 30 tablet, Rfl: 0   imiquimod (ALDARA) 5 % cream, APPLY 3 TIMES A WEEK, Disp: 24 each, Rfl: 0   lidocaine (XYLOCAINE) 2 % solution, Use as directed 15 mLs in the mouth or throat daily as needed (acid reflux)., Disp: 100 mL, Rfl: 2   Multiple Vitamins-Minerals (ONE DAILY FOR MEN/LYCOPENE) TABS, Take 1 tablet by mouth daily., Disp: , Rfl:    nortriptyline (PAMELOR) 25 MG capsule, Take 25 mg by mouth at bedtime., Disp: , Rfl: 0   oxybutynin (DITROPAN) 5 MG tablet, TAKE 1 TABLET BY MOUTH AT BEDTIME (Patient taking differently: Take 5 mg by mouth at bedtime.), Disp: 90 tablet, Rfl: 0   pantoprazole (PROTONIX) 40 MG tablet, Take 1 tablet by mouth once daily, Disp: 90  tablet, Rfl: 0   sildenafil (VIAGRA) 100 MG tablet, Take 100 mg by mouth daily., Disp: , Rfl:    sucralfate (CARAFATE) 1 g tablet, Take 1 tablet (1 g total) by mouth 3 (three) times daily. Take 20 minutes after meals and at bedtime, Disp: 120 tablet, Rfl: 1   tamsulosin (FLOMAX) 0.4 MG CAPS capsule, Take 1 capsule by mouth twice daily (Patient taking differently: Take 0.4 mg by mouth 2 (two) times daily. Take 1 capsule by mouth twice daily), Disp: 180 capsule, Rfl: 3   timolol (TIMOPTIC) 0.5 % ophthalmic solution, Place 1 drop into both eyes daily., Disp: , Rfl: 6   tiZANidine (ZANAFLEX) 4 MG tablet, Take 4 mg by mouth every 8 (eight) hours as needed for muscle spasms. , Disp: , Rfl:    tretinoin (RETIN-A) 0.1 % cream, Apply 1 application topically at bedtime., Disp: 45 g, Rfl: 2   zolpidem (AMBIEN) 10 MG tablet, Take 1 tablet (10 mg total) by mouth at bedtime as needed. for sleep, Disp: 90 tablet, Rfl: 1  Social History   Tobacco Use  Smoking Status Every Day   Packs/day: 0.75   Years: 47.00   Pack years: 35.25   Types: Cigarettes  Smokeless Tobacco Never  Tobacco Comments   since age 39    Allergies  Allergen Reactions   Tramadol Itching   Morphine And Related Itching   Oxycodone Itching   Objective:   There were no vitals filed for this visit. There is no height or weight on file to calculate BMI. Constitutional Well developed. Well nourished.  Vascular Dorsalis pedis pulses palpable bilaterally. Posterior tibial pulses palpable bilaterally. Capillary refill normal to all digits.  No cyanosis or clubbing noted. Pedal hair growth normal.  Neurologic Normal speech. Oriented to person, place, and time. Epicritic sensation to light touch grossly present bilaterally.  Dermatologic Nails well groomed and normal in appearance. No open wounds. No skin lesions.  Orthopedic:  Limited range of motion noted of bilateral first metatarsophalangeal joint with left more painful than  right.  Intra-articular pain noted bilaterally.  Pain with end range of motion.  Hallux limitus noted bilaterally.   Radiographs: 2 views of bilaterally skeletally mature adult foot: Severe arthritic changes noted to the right side.  Uneven joint space narrowing noted to the left side.  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  No other bony abnormalities identified mild arthritic changes noted to the dorsal aspect of the midfoot.  Mild elevatus  present bilaterally. Assessment:   1. Arthritis of first metatarsophalangeal (MTP) joint of left foot   2. Arthritis of first metatarsophalangeal (MTP) joint of right foot     Plan:  Patient was evaluated and treated and all questions answered.  Bilateral first metatarsophalangeal joint arthritis with left more painful than right -Given clinically patient is improving and the pain is decreasing with steroid injection I believe he will benefit from another steroid injection to bilateral first metatarsophalangeal joint to help him get to closer to 95% or greater in terms of pain relief.  Patient states understanding would like to proceed with a injection to bilateral first metatarsophalangeal joint.  However at this time I am worried that his pain is not getting resolved and he is needing more injection.  I briefly discussed with him that he will need surgical intervention if this injection does not help.  For now patient states injections do help and once he gets his social situation under control he will proceed with surgery.  Patient states understanding -A steroid injection was performed at bilateral first metatarsophalangeal joint using 1% plain Lidocaine and 10 mg of Kenalog. This was well tolerated. -I will also discussed with him orthotics management with Morton's extension take some of the stress off of the first metatarsophalangeal joint. -I will continue doing bilateral  steroid injection as long as it is controlling his pain.  I will continue to do steroid injection until patient is ready for surgery about every 12 weeks No follow-ups on file.

## 2021-06-07 ENCOUNTER — Other Ambulatory Visit: Payer: Self-pay | Admitting: Internal Medicine

## 2021-06-07 NOTE — Telephone Encounter (Signed)
Please refill as per office routine med refill policy (all routine meds refilled for 3 mo or monthly per pt preference up to one year from last visit, then month to month grace period for 3 mo, then further med refills will have to be denied)  

## 2021-06-23 ENCOUNTER — Other Ambulatory Visit: Payer: Self-pay | Admitting: Internal Medicine

## 2021-06-23 NOTE — Telephone Encounter (Signed)
Please refill as per office routine med refill policy (all routine meds to be refilled for 3 mo or monthly (per pt preference) up to one year from last visit, then month to month grace period for 3 mo, then further med refills will have to be denied) ? ?

## 2021-07-09 ENCOUNTER — Telehealth: Payer: Self-pay

## 2021-07-09 MED ORDER — ZOLPIDEM TARTRATE 10 MG PO TABS: 10.0000 mg | ORAL_TABLET | Freq: Every evening | ORAL | 1 refills | Status: DC | PRN

## 2021-07-09 NOTE — Telephone Encounter (Signed)
Please advise as the pt has stated he is in need of a refill for his sleeping medication zolpidem (AMBIEN) 10 MG tablet as he is out. LOV: 03/28/2021.

## 2021-07-09 NOTE — Telephone Encounter (Signed)
Done erx 

## 2021-07-10 NOTE — Telephone Encounter (Signed)
Left detailed message on patient's voice mail.

## 2021-07-13 ENCOUNTER — Ambulatory Visit: Payer: Medicare Other | Admitting: Internal Medicine

## 2021-07-16 ENCOUNTER — Other Ambulatory Visit: Payer: Self-pay

## 2021-07-16 ENCOUNTER — Encounter: Payer: Self-pay | Admitting: Internal Medicine

## 2021-07-16 ENCOUNTER — Ambulatory Visit: Payer: Medicare Other | Admitting: Internal Medicine

## 2021-07-16 VITALS — BP 160/80 | HR 71 | Temp 98.1°F | Ht 75.0 in | Wt 168.0 lb

## 2021-07-16 DIAGNOSIS — E559 Vitamin D deficiency, unspecified: Secondary | ICD-10-CM

## 2021-07-16 DIAGNOSIS — Z23 Encounter for immunization: Secondary | ICD-10-CM | POA: Diagnosis not present

## 2021-07-16 DIAGNOSIS — F17200 Nicotine dependence, unspecified, uncomplicated: Secondary | ICD-10-CM

## 2021-07-16 DIAGNOSIS — I10 Essential (primary) hypertension: Secondary | ICD-10-CM | POA: Diagnosis not present

## 2021-07-16 NOTE — Assessment & Plan Note (Signed)
Last vitamin D Lab Results  Component Value Date   VD25OH 31.45 04/13/2021   Low, to start oral replacement

## 2021-07-16 NOTE — Assessment & Plan Note (Signed)
Counseled to quit, pt not ready 

## 2021-07-16 NOTE — Patient Instructions (Addendum)
Please take OTC Vitamin D3 at 2000 units per day, indefinitely  Please quit smoking as we discussed  Please continue to monitor your BP at home as discussed, with the goal to be at least less than 140/90  You had the flu shot today. And the Prevnar 20 (pneumonia) shot today  Please continue all other medications as before, and refills have been done if requested.  Please have the pharmacy call with any other refills you may need.  Please continue your efforts at being more active, low cholesterol diet, and weight control  Please keep your appointments with your specialists as you may have planned  Please make an Appointment to return in 3 months, or sooner if needed

## 2021-07-16 NOTE — Progress Notes (Signed)
Patient ID: Mark Dunlap, male   DOB: 11-08-1960, 60 y.o.   MRN: [id number]        Chief Complaint: follow up HTN, smoking, low vit d, and needs flu and pneumonia shots       HPI:  Mark Dunlap is a 60 y.o. male here overall doing ok, has been cutting down on smoking but not ready to quit.  Not taking Vit D  Due for flu and prevnar shots.  BP < 140/0 at home per pt.  Pt denies chest pain, increased sob or doe, wheezing, orthopnea, PND, increased LE swelling, palpitations, dizziness or syncope.   Pt denies polydipsia, polyuria, or new focal neuro s/s.   Pt denies fever, wt loss, night sweats, loss of appetite, or other constitutional symptoms  No other new complaints      Wt Readings from Last 3 Encounters:  07/16/21 168 lb (76.2 kg)  05/16/21 171 lb (77.6 kg)  04/13/21 169 lb 8 oz (76.9 kg)   BP Readings from Last 3 Encounters:  07/16/21 (!) 160/80  05/16/21 140/78  04/13/21 133/85         Past Medical History:  Diagnosis Date   Chronic back pain    upper to lower   Depression    GERD (gastroesophageal reflux disease)    Glaucoma, both eyes    H/O: substance abuse ([place])    03-21-2021   pt stated hx of ETOH/Crack cocaine-none since 12/2012 per pt/Does not Drive due to this   History of CVA (cerebrovascular accident) without residual deficits 10/26/2018   right BG/ CR infarct secondart to moderate SVD,  no residual   History of hyperthyroidism 2011   s/p RAI  06-20-2010,  followed by pcp   History of prostatitis 2010   HLD (hyperlipidemia)    Hypertension    followed by pcp    (nuclear stress test in epic 08-30-2013  normal no ischemia , ef 50%)   Legally blind in left eye, as defined in [place]    Blind Left eye, small amt vision Right eye   Lumbar radiculopathy    OA (osteoarthritis)    Prostate cancer [facility]) urologist--- dr Louis Meckel   dx 11-21-2020, Stage T1c, Gleason 4+3   Right thyroid nodule    Seasonal allergies    Spondylolisthesis, lumbar region    Wears dentures     upper full and lower partial   Wears glasses    Past Surgical History:  Procedure Laterality Date   ANTERIOR CERVICAL DECOMP/DISCECTOMY FUSION  10/11/2011   Procedure: ANTERIOR CERVICAL DECOMPRESSION/DISCECTOMY FUSION 3 LEVELS;  Surgeon: Eustace Moore;  Location: [place] [facility];  Service: Neurosurgery;  Laterality: N/A;  Cervical three-four ,cervical four five cervical five six Anterior Cervical Decompression Fusion with peek + plate Nuvasive translational plate Orthofix peek (2 1/2 hours) Rm # [id number]   ANTERIOR CERVICAL DECOMP/DISCECTOMY FUSION N/A 07/29/2014   Procedure: ANTERIOR CERVICAL DECOMPRESSION/DISCECTOMY FUSION 1 LEVEL/HARDWARE REMOVAL;  Surgeon: Eustace Moore, MD;  Location: [place] [facility];  Service: Neurosurgery;  Laterality: N/A;  cervical six-seven   COLONOSCOPY     COLONOSCOPY  lastone 07-12-2020   CYSTOSCOPY N/A 03/23/2021   Procedure: CYSTOSCOPY FLEXIBLE;  Surgeon: [name], MD;  [facility]: [facility] [place] [facility];  Service: Urology;  Laterality: N/A;   HAND SURGERY Right x2  12/ 2007   I & D abscess   HARDWARE REMOVAL N/A 09/11/2020   Procedure: Removal of hardware LUmbar Four-Lumbar Five;  Surgeon: Eustace Moore, MD;  Location: [place] [facility];  Service: Neurosurgery;  Laterality: N/A;   LAMINECTOMY WITH POSTERIOR LATERAL ARTHRODESIS LEVEL 2 N/A 09/11/2020   Procedure: Laminectomy and Foraminotomy - Lumbar Three-Lumbar Four, posterior lateral fusion Lumbar Three-Four and Lumbar Four- Five;  Surgeon: Eustace Moore, MD;  Location: [place];  Service: Neurosurgery;  Laterality: N/A;   POSTERIOR CERVICAL FUSION/FORAMINOTOMY N/A 09/23/2013   Procedure: CERVICALTWO TO CERVICAL SEVEN POSTERIOR CERVICAL FUSION/FORAMINOTOMY WITH LATERAL MASS FIXATION;  Surgeon: Eustace Moore, MD;  Location: [place] [facility];  Service: Neurosurgery;  Laterality: N/A;   POSTERIOR LUMBAR FUSION  11-27-2017  @[facility]    L4--5   RADIOACTIVE SEED IMPLANT N/A 03/23/2021   Procedure: RADIOACTIVE SEED IMPLANT/BRACHYTHERAPY  IMPLANT;  Surgeon: [name], MD;  Location: [facility];  Service: Urology;  Laterality: N/A;   SPACE OAR INSTILLATION N/A 03/23/2021   Procedure: SPACE OAR INSTILLATION;  Surgeon: [name], MD;  Location[facility];  Service: Urology;  Laterality: N/A;   TOTAL HIP ARTHROPLASTY  12/17/2011   Procedure: TOTAL HIP ARTHROPLASTY;  Surgeon: Johnny Bridge, MD;  Location: [place];  Service: Orthopedics;  Laterality: Left;    reports that he has been smoking cigarettes. He has a 35.25 pack-year smoking history. He has never used smokeless tobacco. He reports that he does not currently use drugs after having used the following drugs: "Crack" cocaine. He reports that he does not drink alcohol. family history includes Alcohol abuse in his brother, cousin, and father; Arthritis in his mother; Cancer in his brother and sister; Diabetes in his brother and sister; Goiter in his mother and sister; Heart disease in his father and another family member; Hyperlipidemia in his mother; Hypertension in his sister; Stroke in his father. Allergies  Allergen Reactions   Tramadol Itching   Morphine And Related Itching   Oxycodone Itching   Current Outpatient Medications on File Prior to Visit  Medication Sig Dispense Refill   adapalene (DIFFERIN) 0.1 % gel APPLY 1 APPLICATION TOPICALLY AT BEDTIME. 45 g 1   aspirin EC 81 MG tablet Take 81 mg by mouth daily. Swallow whole.     atorvastatin (LIPITOR) 40 MG tablet TAKE 1 TABLET BY MOUTH ONCE DAILY AT  6  PM 90 tablet 3   bimatoprost (LUMIGAN) 0.01 % SOLN Place 1 drop into both eyes at bedtime.     citalopram (CELEXA) 20 MG tablet Take 1 tablet by mouth once daily 90 tablet 3   clopidogrel (PLAVIX) 75 MG tablet Take 1 tablet by mouth once daily 90 tablet 3   diazepam (VALIUM) 10 MG tablet Take 10 mg by mouth as needed.     diphenhydrAMINE (BENADRYL) 25 mg capsule Take 50 mg by mouth every 6 (six) hours as needed for itching.       famotidine (PEPCID) 40 MG tablet Take 1 tablet (40 mg total) by mouth 2 (two) times daily. Take every morning and at bedtime 60 tablet 3   fluticasone (FLONASE) 50 MCG/ACT nasal spray USE 2 SPRAYS DAILY IN BOTH NOSTRILS 32 mL 0   gabapentin (NEURONTIN) 100 MG capsule Take 200 mg by mouth 2 (two) times daily.  2   hydrochlorothiazide (MICROZIDE) 12.5 MG capsule TAKE 1 CAPSULE BY MOUTH EVERY DAY 90 capsule 1   HYDROcodone-acetaminophen (NORCO) 10-325 MG tablet Take 1 tablet by mouth every 6 (six) hours as needed for moderate pain. 30 tablet 0   imiquimod (ALDARA) 5 % cream APPLY 3 TIMES A WEEK 24 each 0   lidocaine (  XYLOCAINE) 2 % solution Use as directed 15 mLs in the mouth or throat daily as needed (acid reflux). 100 mL 2   Multiple Vitamins-Minerals (ONE DAILY FOR MEN/LYCOPENE) TABS Take 1 tablet by mouth daily.     nortriptyline (PAMELOR) 25 MG capsule Take 25 mg by mouth at bedtime.  0   oxybutynin (DITROPAN) 5 MG tablet TAKE 1 TABLET BY MOUTH AT BEDTIME 90 tablet 0   pantoprazole (PROTONIX) 40 MG tablet Take 1 tablet by mouth once daily 90 tablet 0   sildenafil (VIAGRA) 100 MG tablet Take 100 mg by mouth daily.     sucralfate (CARAFATE) 1 g tablet Take 1 tablet (1 g total) by mouth 3 (three) times daily. Take 20 minutes after meals and at bedtime 120 tablet 1   tamsulosin (FLOMAX) 0.4 MG CAPS capsule Take 1 capsule by mouth twice daily (Patient taking differently: Take 0.4 mg by mouth 2 (two) times daily. Take 1 capsule by mouth twice daily) 180 capsule 3   timolol (TIMOPTIC) 0.5 % ophthalmic solution Place 1 drop into both eyes daily.  6   tiZANidine (ZANAFLEX) 4 MG tablet Take 4 mg by mouth every 8 (eight) hours as needed for muscle spasms.      tretinoin (RETIN-A) 0.1 % cream Apply 1 application topically at bedtime. 45 g 2   zolpidem (AMBIEN) 10 MG tablet Take 1 tablet (10 mg total) by mouth at bedtime as needed. for sleep 90 tablet 1   No current facility-administered medications on file  prior to visit.        ROS:  All others reviewed and negative.  Objective        PE:  BP (!) 160/80 (BP Location: Right Arm, Patient Position: Sitting, Cuff Size: Normal)   Pulse 71   Temp 98.1 F (36.7 C) (Oral)   Ht 6\' 3"  (1.905 m)   Wt 168 lb (76.2 kg)   SpO2 98%   BMI 21.00 kg/m                 Constitutional: Pt appears in NAD               HENT: Head: NCAT.                Right Ear: External ear normal.                 Left Ear: External ear normal.                Eyes: . Pupils are equal, round, and reactive to light. Conjunctivae and EOM are normal               Nose: without d/c or deformity               Neck: Neck supple. Gross normal ROM               Cardiovascular: Normal rate and regular rhythm.                 Pulmonary/Chest: Effort normal and breath sounds without rales or wheezing.                Abd:  Soft, NT, ND, + BS, no organomegaly               Neurological: Pt is alert. At baseline orientation, motor grossly intact               Skin: Skin is warm. No rashes, no  other new lesions, LE edema - none               Psychiatric: Pt behavior is normal without agitation   Micro: none  Cardiac tracings I have personally interpreted today:  none  Pertinent Radiological findings (summarize): none   Lab Results  Component Value Date   WBC 9.0 04/13/2021   HGB 11.3 (L) 04/13/2021   HCT 35.3 (L) 04/13/2021   PLT 389.0 04/13/2021   GLUCOSE 89 04/13/2021   CHOL 124 04/13/2021   TRIG 114.0 04/13/2021   HDL 48.00 04/13/2021   LDLDIRECT 100.0 09/02/2018   LDLCALC 53 04/13/2021   ALT 14 04/13/2021   AST 15 04/13/2021   NA 137 04/13/2021   K 3.5 04/13/2021   CL 100 04/13/2021   CREATININE 1.04 04/13/2021   BUN 8 04/13/2021   CO2 29 04/13/2021   TSH 1.04 04/13/2021   PSA 4.2 (H) 05/02/2020   INR 0.9 03/21/2021   HGBA1C 5.5 05/03/2019   Assessment/Plan:  Mark Dunlap is a 60 y.o. Black or African American [2] male with  has a past medical history  of Chronic back pain, Depression, GERD (gastroesophageal reflux disease), Glaucoma, both eyes, H/O: substance abuse ([place]), History of CVA (cerebrovascular accident) without residual deficits (10/26/2018), History of hyperthyroidism (2011), History of prostatitis (2010), HLD (hyperlipidemia), Hypertension, Legally blind in left eye, as defined in [place], Lumbar radiculopathy, OA (osteoarthritis), Prostate cancer ([place]) (urologist--- dr Louis Meckel), Right thyroid nodule, Seasonal allergies, Spondylolisthesis, lumbar region, Wears dentures, and Wears glasses.  Vitamin D deficiency Last vitamin D Lab Results  Component Value Date   VD25OH 31.45 04/13/2021   Low, to start oral replacement   Essential hypertension BP Readings from Last 3 Encounters:  07/16/21 (!) 160/80  05/16/21 140/78  04/13/21 133/85   Uncontrolled here, pt to continue medical treatment hct no change as delcines med tx change   Smoker Counseled to quit, pt not ready  Followup: Return in about 3 months (around 10/16/2021).  Cathlean Cower, MD 07/16/2021 8:48 PM [facility]

## 2021-07-16 NOTE — Assessment & Plan Note (Addendum)
BP Readings from Last 3 Encounters:  07/16/21 (!) 160/80  05/16/21 140/78  04/13/21 133/85   Uncontrolled here, pt to continue medical treatment hct no change as delcines med tx change

## 2021-07-19 ENCOUNTER — Other Ambulatory Visit: Payer: Self-pay | Admitting: Internal Medicine

## 2021-07-19 NOTE — Telephone Encounter (Signed)
Please refill as per office routine med refill policy (all routine meds to be refilled for 3 mo or monthly (per pt preference) up to one year from last visit, then month to month grace period for 3 mo, then further med refills will have to be denied) ? ?

## 2021-07-20 NOTE — Telephone Encounter (Signed)
Patient calling to check status of refill request  Says he is completely out of medication

## 2021-08-31 ENCOUNTER — Ambulatory Visit: Payer: Medicare Other | Admitting: Podiatry

## 2021-08-31 ENCOUNTER — Other Ambulatory Visit: Payer: Self-pay

## 2021-08-31 DIAGNOSIS — M19071 Primary osteoarthritis, right ankle and foot: Secondary | ICD-10-CM

## 2021-08-31 DIAGNOSIS — M19072 Primary osteoarthritis, left ankle and foot: Secondary | ICD-10-CM

## 2021-09-04 ENCOUNTER — Encounter: Payer: Self-pay | Admitting: Podiatry

## 2021-09-04 NOTE — Progress Notes (Signed)
Subjective:  Patient ID: Mark Dunlap, male    DOB: 01-31-1961,  MRN: [id number]  Chief Complaint  Patient presents with   Arthritis    Injection     60 y.o. male presents with the above complaint.  Patient is here for follow-up of bilateral first metatarsophalangeal joint pain with severe arthrosis.  He is due for his 76-month steroid injection as it does continue to give him relief for many months.  I will plan on doing surgery once he is ready for it.  Or if the steroid shot stops working   Review of Systems: Negative except as noted in the HPI. Denies N/V/F/Ch.  Past Medical History:  Diagnosis Date   Chronic back pain    upper to lower   Depression    GERD (gastroesophageal reflux disease)    Glaucoma, both eyes    H/O: substance abuse ([place])    03-21-2021   pt stated hx of ETOH/Crack cocaine-none since 12/2012 per pt/Does not Drive due to this   History of CVA (cerebrovascular accident) without residual deficits 10/26/2018   right BG/ CR infarct secondart to moderate SVD,  no residual   History of hyperthyroidism 2011   s/p RAI  06-20-2010,  followed by pcp   History of prostatitis 2010   HLD (hyperlipidemia)    Hypertension    followed by pcp    (nuclear stress test in epic 08-30-2013  normal no ischemia , ef 50%)   Legally blind in left eye, as defined in [place]    Blind Left eye, small amt vision Right eye   Lumbar radiculopathy    OA (osteoarthritis)    Prostate cancer [facility]) urologist--- dr Louis Meckel   dx 11-21-2020, Stage T1c, Gleason 4+3   Right thyroid nodule    Seasonal allergies    Spondylolisthesis, lumbar region    Wears dentures    upper full and lower partial   Wears glasses     Current Outpatient Medications:    adapalene (DIFFERIN) 0.1 % gel, APPLY 1 APPLICATION TOPICALLY AT BEDTIME., Disp: 45 g, Rfl: 1   aspirin EC 81 MG tablet, Take 81 mg by mouth daily. Swallow whole., Disp: , Rfl:    atorvastatin (LIPITOR) 40 MG tablet, TAKE 1 TABLET BY MOUTH  ONCE DAILY AT 6 PM, Disp: 90 tablet, Rfl: 0   bimatoprost (LUMIGAN) 0.01 % SOLN, Place 1 drop into both eyes at bedtime., Disp: , Rfl:    citalopram (CELEXA) 20 MG tablet, Take 1 tablet by mouth once daily, Disp: 90 tablet, Rfl: 3   clopidogrel (PLAVIX) 75 MG tablet, Take 1 tablet by mouth once daily, Disp: 90 tablet, Rfl: 3   diazepam (VALIUM) 10 MG tablet, Take 10 mg by mouth as needed., Disp: , Rfl:    diphenhydrAMINE (BENADRYL) 25 mg capsule, Take 50 mg by mouth every 6 (six) hours as needed for itching. , Disp: , Rfl:    famotidine (PEPCID) 40 MG tablet, Take 1 tablet (40 mg total) by mouth 2 (two) times daily. Take every morning and at bedtime, Disp: 60 tablet, Rfl: 3   fluticasone (FLONASE) 50 MCG/ACT nasal spray, USE 2 SPRAYS DAILY IN BOTH NOSTRILS, Disp: 32 mL, Rfl: 0   gabapentin (NEURONTIN) 100 MG capsule, Take 200 mg by mouth 2 (two) times daily., Disp: , Rfl: 2   hydrochlorothiazide (MICROZIDE) 12.5 MG capsule, TAKE 1 CAPSULE BY MOUTH EVERY DAY, Disp: 90 capsule, Rfl: 1   HYDROcodone-acetaminophen (NORCO) 10-325 MG tablet, Take 1 tablet by mouth  every 6 (six) hours as needed for moderate pain., Disp: 30 tablet, Rfl: 0   imiquimod (ALDARA) 5 % cream, APPLY 3 TIMES A WEEK, Disp: 24 each, Rfl: 0   lidocaine (XYLOCAINE) 2 % solution, Use as directed 15 mLs in the mouth or throat daily as needed (acid reflux)., Disp: 100 mL, Rfl: 2   Multiple Vitamins-Minerals (ONE DAILY FOR MEN/LYCOPENE) TABS, Take 1 tablet by mouth daily., Disp: , Rfl:    nortriptyline (PAMELOR) 25 MG capsule, Take 25 mg by mouth at bedtime., Disp: , Rfl: 0   oxybutynin (DITROPAN) 5 MG tablet, TAKE 1 TABLET BY MOUTH AT BEDTIME, Disp: 90 tablet, Rfl: 0   pantoprazole (PROTONIX) 40 MG tablet, Take 1 tablet by mouth once daily, Disp: 90 tablet, Rfl: 0   sildenafil (VIAGRA) 100 MG tablet, Take 100 mg by mouth daily., Disp: , Rfl:    sucralfate (CARAFATE) 1 g tablet, Take 1 tablet (1 g total) by mouth 3 (three) times daily.  Take 20 minutes after meals and at bedtime, Disp: 120 tablet, Rfl: 1   tamsulosin (FLOMAX) 0.4 MG CAPS capsule, Take 1 capsule by mouth twice daily (Patient taking differently: Take 0.4 mg by mouth 2 (two) times daily. Take 1 capsule by mouth twice daily), Disp: 180 capsule, Rfl: 3   timolol (TIMOPTIC) 0.5 % ophthalmic solution, Place 1 drop into both eyes daily., Disp: , Rfl: 6   tiZANidine (ZANAFLEX) 4 MG tablet, Take 4 mg by mouth every 8 (eight) hours as needed for muscle spasms. , Disp: , Rfl:    tretinoin (RETIN-A) 0.1 % cream, Apply 1 application topically at bedtime., Disp: 45 g, Rfl: 2   zolpidem (AMBIEN) 10 MG tablet, Take 1 tablet (10 mg total) by mouth at bedtime as needed. for sleep, Disp: 90 tablet, Rfl: 1  Social History   Tobacco Use  Smoking Status Every Day   Packs/day: 0.75   Years: 47.00   Pack years: 35.25   Types: Cigarettes  Smokeless Tobacco Never  Tobacco Comments   since age 1    Allergies  Allergen Reactions   Tramadol Itching   Morphine And Related Itching   Oxycodone Itching   Objective:   There were no vitals filed for this visit. There is no height or weight on file to calculate BMI. Constitutional Well developed. Well nourished.  Vascular Dorsalis pedis pulses palpable bilaterally. Posterior tibial pulses palpable bilaterally. Capillary refill normal to all digits.  No cyanosis or clubbing noted. Pedal hair growth normal.  Neurologic Normal speech. Oriented to person, place, and time. Epicritic sensation to light touch grossly present bilaterally.  Dermatologic Nails well groomed and normal in appearance. No open wounds. No skin lesions.  Orthopedic:  Limited range of motion noted of bilateral first metatarsophalangeal joint with left more painful than right.  Intra-articular pain noted bilaterally.  Pain with end range of motion.  Hallux limitus noted bilaterally.   Radiographs: 2 views of bilaterally skeletally mature adult foot: Severe  arthritic changes noted to the right side.  Uneven joint space narrowing noted to the left side.  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  No other bony abnormalities identified mild arthritic changes noted to the dorsal aspect of the midfoot.  Mild elevatus present bilaterally. Assessment:   1. Arthritis of first metatarsophalangeal (MTP) joint of left foot   2. Arthritis of first metatarsophalangeal (MTP) joint of right foot      Plan:  Patient was evaluated and treated and all questions answered.  Bilateral first metatarsophalangeal joint arthritis with left more painful than right -Given clinically patient is improving and the pain is decreasing with steroid injection I believe he will benefit from another steroid injection to bilateral first metatarsophalangeal joint to help him get to closer to 95% or greater in terms of pain relief.  Patient states understanding would like to proceed with a injection to bilateral first metatarsophalangeal joint.  However at this time I am worried that his pain is not getting resolved and he is needing more injection.  I briefly discussed with him that he will need surgical intervention if this injection does not help.  For now patient states injections do help and once he gets his social situation under control he will proceed with surgery.  Patient states understanding -A steroid injection was performed at bilateral first metatarsophalangeal joint using 1% plain Lidocaine and 10 mg of Kenalog. This was well tolerated. -I will also discussed with him orthotics management with Morton's extension take some of the stress off of the first metatarsophalangeal joint. -I will continue doing bilateral steroid injection as long as it is controlling his pain.  I will continue to do steroid injection until patient is ready for surgery about every 12 weeks No follow-ups on file.

## 2021-09-19 ENCOUNTER — Other Ambulatory Visit: Payer: Self-pay | Admitting: Internal Medicine

## 2021-09-19 NOTE — Telephone Encounter (Signed)
Please refill as per office routine med refill policy (all routine meds to be refilled for 3 mo or monthly (per pt preference) up to one year from last visit, then month to month grace period for 3 mo, then further med refills will have to be denied) ? ?

## 2021-09-24 ENCOUNTER — Telehealth: Payer: Self-pay | Admitting: Internal Medicine

## 2021-09-24 NOTE — Telephone Encounter (Signed)
1.Medication Requested: atorvastatin (LIPITOR) 40 MG tablet   2. Pharmacy (Name, Street, [place]): [facility] [place] - [id number]  Phone:  [phone] Fax:  [phone]   3. On Med List: yes  4. Last Visit with PCP: 01.06.23  5. Next visit date with PCP: 10.03.22   Agent: Please be advised that RX refills may take up to 3 business days. We ask that you follow-up with your pharmacy.

## 2021-09-25 MED ORDER — ATORVASTATIN CALCIUM 40 MG PO TABS: 40.0000 mg | ORAL_TABLET | Freq: Every day | ORAL | 0 refills | Status: DC

## 2021-10-19 ENCOUNTER — Ambulatory Visit: Payer: Medicare Other | Admitting: Internal Medicine

## 2021-10-28 ENCOUNTER — Other Ambulatory Visit: Payer: Self-pay | Admitting: Internal Medicine

## 2021-10-28 NOTE — Telephone Encounter (Signed)
Please refill as per office routine med refill policy (all routine meds to be refilled for 3 mo or monthly (per pt preference) up to one year from last visit, then month to month grace period for 3 mo, then further med refills will have to be denied) ? ?

## 2021-12-04 ENCOUNTER — Other Ambulatory Visit: Payer: Self-pay | Admitting: Internal Medicine

## 2021-12-04 NOTE — Telephone Encounter (Signed)
Please refill as per office routine med refill policy (all routine meds to be refilled for 3 mo or monthly (per pt preference) up to one year from last visit, then month to month grace period for 3 mo, then further med refills will have to be denied) ? ?

## 2021-12-04 NOTE — Telephone Encounter (Signed)
Pt checking status of refill request. Advised pt request may take up to 3bds

## 2021-12-05 ENCOUNTER — Other Ambulatory Visit: Payer: Self-pay

## 2021-12-05 ENCOUNTER — Ambulatory Visit: Payer: Medicare Other | Admitting: Podiatry

## 2021-12-05 DIAGNOSIS — M19071 Primary osteoarthritis, right ankle and foot: Secondary | ICD-10-CM | POA: Diagnosis not present

## 2021-12-05 DIAGNOSIS — M19072 Primary osteoarthritis, left ankle and foot: Secondary | ICD-10-CM

## 2021-12-07 ENCOUNTER — Encounter: Payer: Self-pay | Admitting: Internal Medicine

## 2021-12-07 ENCOUNTER — Encounter: Payer: Self-pay | Admitting: Podiatry

## 2021-12-07 ENCOUNTER — Ambulatory Visit: Payer: Medicare Other | Admitting: Internal Medicine

## 2021-12-07 ENCOUNTER — Other Ambulatory Visit: Payer: Self-pay

## 2021-12-07 VITALS — BP 130/80 | HR 65 | Temp 98.0°F | Ht 75.0 in | Wt 171.0 lb

## 2021-12-07 DIAGNOSIS — F17200 Nicotine dependence, unspecified, uncomplicated: Secondary | ICD-10-CM

## 2021-12-07 DIAGNOSIS — I10 Essential (primary) hypertension: Secondary | ICD-10-CM

## 2021-12-07 DIAGNOSIS — D649 Anemia, unspecified: Secondary | ICD-10-CM

## 2021-12-07 DIAGNOSIS — R609 Edema, unspecified: Secondary | ICD-10-CM | POA: Diagnosis not present

## 2021-12-07 DIAGNOSIS — R600 Localized edema: Secondary | ICD-10-CM

## 2021-12-07 DIAGNOSIS — N289 Disorder of kidney and ureter, unspecified: Secondary | ICD-10-CM | POA: Diagnosis not present

## 2021-12-07 DIAGNOSIS — Z0001 Encounter for general adult medical examination with abnormal findings: Secondary | ICD-10-CM

## 2021-12-07 DIAGNOSIS — E559 Vitamin D deficiency, unspecified: Secondary | ICD-10-CM

## 2021-12-07 DIAGNOSIS — I700 Atherosclerosis of aorta: Secondary | ICD-10-CM

## 2021-12-07 DIAGNOSIS — E538 Deficiency of other specified B group vitamins: Secondary | ICD-10-CM

## 2021-12-07 DIAGNOSIS — E7800 Pure hypercholesterolemia, unspecified: Secondary | ICD-10-CM

## 2021-12-07 DIAGNOSIS — R739 Hyperglycemia, unspecified: Secondary | ICD-10-CM

## 2021-12-07 NOTE — Patient Instructions (Signed)
Please take OTC Vitamin D3 at 2000 units per day, indefinitely  Please continue all other medications as before, and refills have been done if requested.  Please have the pharmacy call with any other refills you may need.  Please continue your efforts at being more active, low cholesterol diet, and weight control.  You are otherwise up to date with prevention measures today.  Please keep your appointments with your specialists as you may have planned - urology in 4 months  Please go to the LAB at the blood drawing area for the tests to be done  You will be contacted by phone if any changes need to be made immediately.  Otherwise, you will receive a letter about your results with an explanation, but please check with MyChart first.  Please remember to sign up for MyChart if you have not done so, as this will be important to you in the future with finding out test results, communicating by private email, and scheduling acute appointments online when needed.  Please make an Appointment to return in 6 months, or sooner if needed

## 2021-12-07 NOTE — Progress Notes (Signed)
Subjective:  Patient ID: Mark Dunlap, male    DOB: 04/01/1961,  MRN: [id number]  Chief Complaint  Patient presents with   Injections    Bilateral injections    61 y.o. male presents with the above complaint.  Patient is here for follow-up of bilateral first metatarsophalangeal joint pain with severe arthrosis.  He is due for his 69-month steroid injection as it does continue to give him relief for many months.  I will plan on doing surgery once he is ready for it.  Or if the steroid shot stops working   Review of Systems: Negative except as noted in the HPI. Denies N/V/F/Ch.  Past Medical History:  Diagnosis Date   Chronic back pain    upper to lower   Depression    GERD (gastroesophageal reflux disease)    Glaucoma, both eyes    H/O: substance abuse ([place])    03-21-2021   pt stated hx of ETOH/Crack cocaine-none since 12/2012 per pt/Does not Drive due to this   History of CVA (cerebrovascular accident) without residual deficits 10/26/2018   right BG/ CR infarct secondart to moderate SVD,  no residual   History of hyperthyroidism 2011   s/p RAI  06-20-2010,  followed by pcp   History of prostatitis 2010   HLD (hyperlipidemia)    Hypertension    followed by pcp    (nuclear stress test in epic 08-30-2013  normal no ischemia , ef 50%)   Legally blind in left eye, as defined in [place]    Blind Left eye, small amt vision Right eye   Lumbar radiculopathy    OA (osteoarthritis)    Prostate cancer [facility]) urologist--- dr Louis Meckel   dx 11-21-2020, Stage T1c, Gleason 4+3   Right thyroid nodule    Seasonal allergies    Spondylolisthesis, lumbar region    Wears dentures    upper full and lower partial   Wears glasses     Current Outpatient Medications:    adapalene (DIFFERIN) 0.1 % gel, APPLY 1 APPLICATION TOPICALLY AT BEDTIME., Disp: 45 g, Rfl: 1   aspirin EC 81 MG tablet, Take 81 mg by mouth daily. Swallow whole., Disp: , Rfl:    atorvastatin (LIPITOR) 40 MG tablet, Take 1 tablet  (40 mg total) by mouth daily., Disp: 90 tablet, Rfl: 0   bimatoprost (LUMIGAN) 0.01 % SOLN, Place 1 drop into both eyes at bedtime., Disp: , Rfl:    citalopram (CELEXA) 20 MG tablet, Take 1 tablet by mouth once daily, Disp: 90 tablet, Rfl: 3   clopidogrel (PLAVIX) 75 MG tablet, Take 1 tablet by mouth once daily, Disp: 90 tablet, Rfl: 3   diazepam (VALIUM) 10 MG tablet, Take 10 mg by mouth as needed., Disp: , Rfl:    diphenhydrAMINE (BENADRYL) 25 mg capsule, Take 50 mg by mouth every 6 (six) hours as needed for itching. , Disp: , Rfl:    famotidine (PEPCID) 40 MG tablet, Take 1 tablet (40 mg total) by mouth 2 (two) times daily. Take every morning and at bedtime, Disp: 60 tablet, Rfl: 3   fluticasone (FLONASE) 50 MCG/ACT nasal spray, USE 2 SPRAYS DAILY IN BOTH NOSTRILS, Disp: 32 mL, Rfl: 0   gabapentin (NEURONTIN) 100 MG capsule, Take 200 mg by mouth 2 (two) times daily., Disp: , Rfl: 2   hydrochlorothiazide (MICROZIDE) 12.5 MG capsule, TAKE 1 CAPSULE BY MOUTH EVERY DAY, Disp: 90 capsule, Rfl: 1   HYDROcodone-acetaminophen (NORCO) 10-325 MG tablet, Take 1 tablet by mouth every  6 (six) hours as needed for moderate pain., Disp: 30 tablet, Rfl: 0   imiquimod (ALDARA) 5 % cream, APPLY 3 TIMES A WEEK, Disp: 24 each, Rfl: 0   lidocaine (XYLOCAINE) 2 % solution, Use as directed 15 mLs in the mouth or throat daily as needed (acid reflux)., Disp: 100 mL, Rfl: 2   Multiple Vitamins-Minerals (ONE DAILY FOR MEN/LYCOPENE) TABS, Take 1 tablet by mouth daily., Disp: , Rfl:    nortriptyline (PAMELOR) 25 MG capsule, Take 25 mg by mouth at bedtime., Disp: , Rfl: 0   oxybutynin (DITROPAN) 5 MG tablet, TAKE 1 TABLET BY MOUTH AT BEDTIME, Disp: 90 tablet, Rfl: 0   pantoprazole (PROTONIX) 40 MG tablet, Take 1 tablet by mouth once daily, Disp: 90 tablet, Rfl: 2   sildenafil (VIAGRA) 100 MG tablet, Take 100 mg by mouth daily., Disp: , Rfl:    sucralfate (CARAFATE) 1 g tablet, Take 1 tablet (1 g total) by mouth 3 (three)  times daily. Take 20 minutes after meals and at bedtime, Disp: 120 tablet, Rfl: 1   tamsulosin (FLOMAX) 0.4 MG CAPS capsule, Take 1 capsule by mouth twice daily, Disp: 180 capsule, Rfl: 0   timolol (TIMOPTIC) 0.5 % ophthalmic solution, Place 1 drop into both eyes daily., Disp: , Rfl: 6   tiZANidine (ZANAFLEX) 4 MG tablet, Take 4 mg by mouth every 8 (eight) hours as needed for muscle spasms. , Disp: , Rfl:    tretinoin (RETIN-A) 0.1 % cream, Apply 1 application topically at bedtime., Disp: 45 g, Rfl: 2   zolpidem (AMBIEN) 10 MG tablet, Take 1 tablet (10 mg total) by mouth at bedtime as needed. for sleep, Disp: 90 tablet, Rfl: 1  Social History   Tobacco Use  Smoking Status Every Day   Packs/day: 0.75   Years: 47.00   Pack years: 35.25   Types: Cigarettes  Smokeless Tobacco Never  Tobacco Comments   since age 1    Allergies  Allergen Reactions   Tramadol Itching   Morphine And Related Itching   Oxycodone Itching   Objective:   There were no vitals filed for this visit. There is no height or weight on file to calculate BMI. Constitutional Well developed. Well nourished.  Vascular Dorsalis pedis pulses palpable bilaterally. Posterior tibial pulses palpable bilaterally. Capillary refill normal to all digits.  No cyanosis or clubbing noted. Pedal hair growth normal.  Neurologic Normal speech. Oriented to person, place, and time. Epicritic sensation to light touch grossly present bilaterally.  Dermatologic Nails well groomed and normal in appearance. No open wounds. No skin lesions.  Orthopedic:  Limited range of motion noted of bilateral first metatarsophalangeal joint with left more painful than right.  Intra-articular pain noted bilaterally.  Pain with end range of motion.  Hallux limitus noted bilaterally.   Radiographs: 2 views of bilaterally skeletally mature adult foot: Severe arthritic changes noted to the right side.  Uneven joint space narrowing noted to the left  side.  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  No other bony abnormalities identified mild arthritic changes noted to the dorsal aspect of the midfoot.  Mild elevatus present bilaterally. Assessment:   No diagnosis found.    Plan:  Patient was evaluated and treated and all questions answered.  Bilateral first metatarsophalangeal joint arthritis with left more painful than right -Given clinically patient is improving and the pain is decreasing with steroid injection I believe he will benefit from another  steroid injection to bilateral first metatarsophalangeal joint to help him get to closer to 95% or greater in terms of pain relief.  Patient states understanding would like to proceed with a injection to bilateral first metatarsophalangeal joint.  However at this time I am worried that his pain is not getting resolved and he is needing more injection.  I briefly discussed with him that he will need surgical intervention if this injection does not help.  For now patient states injections do help and once he gets his social situation under control he will proceed with surgery.  Patient states understanding -A steroid injection was performed at bilateral first metatarsophalangeal joint using 1% plain Lidocaine and 10 mg of Kenalog. This was well tolerated. -I will also discussed with him orthotics management with Morton's extension take some of the stress off of the first metatarsophalangeal joint. -I will continue doing bilateral steroid injection as long as it is controlling his pain.  I will continue to do steroid injection until patient is ready for surgery about every 12 weeks No follow-ups on file.

## 2021-12-07 NOTE — Progress Notes (Signed)
Patient ID: Mark Dunlap, male   DOB: 05-11-1961, 61 y.o.   MRN: [id number]         Chief Complaint:: wellness exam and Follow-up Low vit d, anemia, leg swelling       HPI:  Mark Dunlap is a 61 y.o. male here for wellness exam; declines covid booster, shingrix, o/w up to date                        Also last seen per urology x 2 mo, last psa 1.85, due for f/u in about 4 mo.  Pt denies chest pain, increased sob or doe, wheezing, orthopnea, PND, palpitations, dizziness or syncope.   Pt denies polydipsia, polyuria, or new focal neuro s/s.  Pt denies fever, wt loss, night sweats, loss of appetite, or other constitutional symptoms  Not taking vit d.  No overt bleeding or bruising.  Has recent mild leg swelling slightlyl worse left > right, worse at end of day, better in the AM.   Wt Readings from Last 3 Encounters:  12/07/21 171 lb (77.6 kg)  07/16/21 168 lb (76.2 kg)  05/16/21 171 lb (77.6 kg)   BP Readings from Last 3 Encounters:  12/07/21 130/80  07/16/21 (!) 160/80  05/16/21 140/78   Immunization History  Administered Date(s) Administered   Influenza Split 08/20/2012   Influenza Whole 08/25/2008   Influenza,inj,Quad PF,6+ Mos 07/19/2013, 07/06/2014, 08/08/2015, 08/21/2017, 09/07/2018, 07/01/2019, 07/16/2021   Influenza-Unspecified 07/17/2020   Moderna Sars-Covid-2 Vaccination 11/30/2019, 12/29/2019, 08/11/2020   PNEUMOCOCCAL CONJUGATE-20 07/16/2021   Pneumococcal Polysaccharide-23 07/30/2014, 07/01/2019   Td 09/15/2006   Tdap 08/08/2015   There are no preventive care reminders to display for this patient.     Past Medical History:  Diagnosis Date   Chronic back pain    upper to lower   Depression    GERD (gastroesophageal reflux disease)    Glaucoma, both eyes    H/O: substance abuse ([place])    03-21-2021   pt stated hx of ETOH/Crack cocaine-none since 12/2012 per pt/Does not Drive due to this   History of CVA (cerebrovascular accident) without residual deficits  10/26/2018   right BG/ CR infarct secondart to moderate SVD,  no residual   History of hyperthyroidism 2011   s/p RAI  06-20-2010,  followed by pcp   History of prostatitis 2010   HLD (hyperlipidemia)    Hypertension    followed by pcp    (nuclear stress test in epic 08-30-2013  normal no ischemia , ef 50%)   Legally blind in left eye, as defined in [place]    Blind Left eye, small amt vision Right eye   Lumbar radiculopathy    OA (osteoarthritis)    Prostate cancer [facility]) urologist--- dr Louis Meckel   dx 11-21-2020, Stage T1c, Gleason 4+3   Right thyroid nodule    Seasonal allergies    Spondylolisthesis, lumbar region    Wears dentures    upper full and lower partial   Wears glasses    Past Surgical History:  Procedure Laterality Date   ANTERIOR CERVICAL DECOMP/DISCECTOMY FUSION  10/11/2011   Procedure: ANTERIOR CERVICAL DECOMPRESSION/DISCECTOMY FUSION 3 LEVELS;  Surgeon: Eustace Moore;  Location: [place] [facility];  Service: Neurosurgery;  Laterality: N/A;  Cervical three-four ,cervical four five cervical five six Anterior Cervical Decompression Fusion with peek + plate Nuvasive translational plate Orthofix peek (2 1/2 hours) Rm # [id number]   ANTERIOR CERVICAL DECOMP/DISCECTOMY FUSION N/A 07/29/2014  Procedure: ANTERIOR CERVICAL DECOMPRESSION/DISCECTOMY FUSION 1 LEVEL/HARDWARE REMOVAL;  Surgeon: Eustace Moore, MD;  Location: [place] [facility];  Service: Neurosurgery;  Laterality: N/A;  cervical six-seven   COLONOSCOPY     COLONOSCOPY  lastone 07-12-2020   CYSTOSCOPY N/A 03/23/2021   Procedure: CYSTOSCOPY FLEXIBLE;  Surgeon: [name], MD;  Location: [facility];  Service: Urology;  Laterality: N/A;   HAND SURGERY Right x2  12/ 2007   I & D abscess   HARDWARE REMOVAL N/A 09/11/2020   Procedure: Removal of hardware LUmbar Four-Lumbar Five;  Surgeon: Eustace Moore, MD;  Location: [place];  Service: Neurosurgery;  Laterality: N/A;   LAMINECTOMY WITH POSTERIOR LATERAL ARTHRODESIS LEVEL 2  N/A 09/11/2020   Procedure: Laminectomy and Foraminotomy - Lumbar Three-Lumbar Four, posterior lateral fusion Lumbar Three-Four and Lumbar Four- Five;  Surgeon: Eustace Moore, MD;  Location: [place];  Service: Neurosurgery;  Laterality: N/A;   POSTERIOR CERVICAL FUSION/FORAMINOTOMY N/A 09/23/2013   Procedure: CERVICALTWO TO CERVICAL SEVEN POSTERIOR CERVICAL FUSION/FORAMINOTOMY WITH LATERAL MASS FIXATION;  Surgeon: Eustace Moore, MD;  Location: [place] [facility];  Service: Neurosurgery;  Laterality: N/A;   POSTERIOR LUMBAR FUSION  11-27-2017  @[facility]    L4--5   RADIOACTIVE SEED IMPLANT N/A 03/23/2021   Procedure: RADIOACTIVE SEED IMPLANT/BRACHYTHERAPY IMPLANT;  Surgeon: [name], MD;  Location: [facility];  Service: Urology;  Laterality: N/A;   SPACE OAR INSTILLATION N/A 03/23/2021   Procedure: SPACE OAR INSTILLATION;  Surgeon: [name], MD;  Location: [facility];  Service: Urology;  Laterality: N/A;   TOTAL HIP ARTHROPLASTY  12/17/2011   Procedure: TOTAL HIP ARTHROPLASTY;  Surgeon: Johnny Bridge, MD;  Location: [place];  Service: Orthopedics;  Laterality: Left;    reports that he has been smoking cigarettes. He has a 35.25 pack-year smoking history. He has never used smokeless tobacco. He reports that he does not currently use drugs after having used the following drugs: "Crack" cocaine. He reports that he does not drink alcohol. family history includes Alcohol abuse in his brother, cousin, and father; Arthritis in his mother; Cancer in his brother and sister; Diabetes in his brother and sister; Goiter in his mother and sister; Heart disease in his father and another family member; Hyperlipidemia in his mother; Hypertension in his sister; Stroke in his father. Allergies  Allergen Reactions   Tramadol Itching   Morphine And Related Itching   Oxycodone Itching   Current Outpatient Medications on File Prior to Visit  Medication Sig Dispense Refill   aspirin EC 81 MG tablet  Take 81 mg by mouth daily. Swallow whole.     bimatoprost (LUMIGAN) 0.01 % SOLN Place 1 drop into both eyes at bedtime.     diazepam (VALIUM) 10 MG tablet Take 10 mg by mouth as needed.     diphenhydrAMINE (BENADRYL) 25 mg capsule Take 50 mg by mouth every 6 (six) hours as needed for itching.      fluticasone (FLONASE) 50 MCG/ACT nasal spray USE 2 SPRAYS DAILY IN BOTH NOSTRILS 32 mL 0   gabapentin (NEURONTIN) 100 MG capsule Take 200 mg by mouth 2 (two) times daily.  2   HYDROcodone-acetaminophen (NORCO) 10-325 MG tablet Take 1 tablet by mouth every 6 (six) hours as needed for moderate pain. 30 tablet 0   lidocaine (XYLOCAINE) 2 % solution Use as directed 15 mLs in the mouth or throat daily as needed (acid reflux). 100 mL 2   Multiple Vitamins-Minerals (ONE DAILY FOR  MEN/LYCOPENE) TABS Take 1 tablet by mouth daily.     nortriptyline (PAMELOR) 25 MG capsule Take 25 mg by mouth at bedtime.  0   sildenafil (VIAGRA) 100 MG tablet Take 100 mg by mouth daily.     timolol (TIMOPTIC) 0.5 % ophthalmic solution Place 1 drop into both eyes daily.  6   tiZANidine (ZANAFLEX) 4 MG tablet Take 4 mg by mouth every 8 (eight) hours as needed for muscle spasms.      No current facility-administered medications on file prior to visit.        ROS:  All others reviewed and negative.  Objective        PE:  BP 130/80 (BP Location: Left Arm, Patient Position: Sitting, Cuff Size: Large)    Pulse 65    Temp 98 F (36.7 C) (Oral)    Ht 6\' 3"  (1.905 m)    Wt 171 lb (77.6 kg)    SpO2 94%    BMI 21.37 kg/m                 Constitutional: Pt appears in NAD               HENT: Head: NCAT.                Right Ear: External ear normal.                 Left Ear: External ear normal.                Eyes: . Pupils are equal, round, and reactive to light. Conjunctivae and EOM are normal               Nose: without d/c or deformity               Neck: Neck supple. Gross normal ROM               Cardiovascular: Normal rate  and regular rhythm.                 Pulmonary/Chest: Effort normal and breath sounds without rales or wheezing.                Abd:  Soft, NT, ND, + BS, no organomegaly               Neurological: Pt is alert. At baseline orientation, motor grossly intact               Skin: Skin is warm. No rashes, no other new lesions, LE edema - trace left > right to knees               Psychiatric: Pt behavior is normal without agitation   Micro: none  Cardiac tracings I have personally interpreted today:  none  Pertinent Radiological findings (summarize): none   Lab Results  Component Value Date   WBC 8.1 12/07/2021   HGB 11.2 (L) 12/07/2021   HCT 36.5 (L) 12/07/2021   PLT 449 (H) 12/07/2021   GLUCOSE 81 12/07/2021   CHOL 139 12/07/2021   TRIG 132 12/07/2021   HDL 50 12/07/2021   LDLDIRECT 100.0 09/02/2018   LDLCALC 67 12/07/2021   ALT 16 12/07/2021   AST 15 12/07/2021   NA 138 12/07/2021   K 4.1 12/07/2021   CL 100 12/07/2021   CREATININE 1.06 12/07/2021   BUN 12 12/07/2021   CO2 33 (H) 12/07/2021   TSH 1.75 12/07/2021   PSA 4.2 (H)  05/02/2020   INR 0.9 03/21/2021   HGBA1C 5.3 12/07/2021   Assessment/Plan:  Mark Dunlap is a 61 y.o. Black or African American [2] male with  has a past medical history of Chronic back pain, Depression, GERD (gastroesophageal reflux disease), Glaucoma, both eyes, H/O: substance abuse ([place]), History of CVA (cerebrovascular accident) without residual deficits (10/26/2018), History of hyperthyroidism (2011), History of prostatitis (2010), HLD (hyperlipidemia), Hypertension, Legally blind in left eye, as defined in [place], Lumbar radiculopathy, OA (osteoarthritis), Prostate cancer ([place]) (urologist--- dr Louis Meckel), Right thyroid nodule, Seasonal allergies, Spondylolisthesis, lumbar region, Wears dentures, and Wears glasses.  Renal insufficiency Lab Results  Component Value Date   CREATININE 1.04 04/13/2021  mild renal slowing uncontrolled - for f/u lab  today  Encounter for well adult exam with abnormal findings Age and sex appropriate education and counseling updated with regular exercise and diet Referrals for preventative services - none needed Immunizations addressed - declines covid booster, shingrix Smoking counseling  - counseled to quit, pt not ready Evidence for depression or other mood disorder - none significant Most recent labs reviewed. I have personally reviewed and have noted: 1) the patient's medical and social history 2) The patient's current medications and supplements 3) The patient's height, weight, and BMI have been recorded in the chart   Aortic atherosclerosis (HCC) Pt to continue lipitor, exercise, low chol diet  Essential hypertension BP Readings from Last 3 Encounters:  12/07/21 130/80  07/16/21 (!) 160/80  05/16/21 140/78   Stable, pt to continue medical treatment hct   Hyperglycemia Lab Results  Component Value Date   HGBA1C 5.3 12/07/2021   Stable, pt to continue current medical treatment   - diet   Hyperlipidemia Lab Results  Component Value Date   LDLCALC 67 12/07/2021   Stable, pt to continue current statin liptior   Smoker Pt counsled to quit, pt not ready  Vitamin D deficiency Last vitamin D Lab Results  Component Value Date   VD25OH 43 12/07/2021   Stable, cont oral replacement   Peripheral edema With mild worsening, c/w venous insufficiency, for compression stocking daytime, low salt diet  Anemia No overt bleeding or bruising, for f/u lab and iron today  Lab Results  Component Value Date   WBC 8.1 12/07/2021   HGB 11.2 (L) 12/07/2021   HCT 36.5 (L) 12/07/2021   MCV 81.8 12/07/2021   PLT 449 (H) 12/07/2021    Followup: Return in about 6 months (around 06/06/2022).  Cathlean Cower, MD 12/08/2021 12:52 PM [facility]

## 2021-12-07 NOTE — Assessment & Plan Note (Signed)
Lab Results  Component Value Date   CREATININE 1.04 04/13/2021  mild renal slowing uncontrolled - for f/u lab today

## 2021-12-08 ENCOUNTER — Encounter: Payer: Self-pay | Admitting: Internal Medicine

## 2021-12-08 DIAGNOSIS — D649 Anemia, unspecified: Secondary | ICD-10-CM | POA: Insufficient documentation

## 2021-12-08 LAB — LIPID PANEL
Cholesterol: 139 mg/dL (ref <200)
HDL: 50 mg/dL (ref >=40)
LDL Cholesterol (Calc): 67 mg/dL (calc)
Non-HDL Cholesterol (Calc): 89 mg/dL (calc) (ref <130)
Total CHOL/HDL Ratio: 2.8 (calc) (ref <5.0)
Triglycerides: 132 mg/dL (ref <150)

## 2021-12-08 LAB — CBC WITH DIFFERENTIAL/PLATELET
Absolute Monocytes: 867 cells/uL (ref 200–950)
Basophils Absolute: 81 cells/uL (ref 0–200)
Basophils Relative: 1 %
Eosinophils Absolute: 389 cells/uL (ref 15–500)
Eosinophils Relative: 4.8 %
HCT: 36.5 % — ABNORMAL LOW (ref 38.5–50.0)
Hemoglobin: 11.2 g/dL — ABNORMAL LOW (ref 13.2–17.1)
Lymphs Abs: 2147 cells/uL (ref 850–3900)
MCH: 25.1 pg — ABNORMAL LOW (ref 27.0–33.0)
MCHC: 30.7 g/dL — ABNORMAL LOW (ref 32.0–36.0)
MCV: 81.8 fL (ref 80.0–100.0)
MPV: 9.7 fL (ref 7.5–12.5)
Monocytes Relative: 10.7 %
Neutro Abs: 4617 cells/uL (ref 1500–7800)
Neutrophils Relative %: 57 %
Platelets: 449 10*3/uL — ABNORMAL HIGH (ref 140–400)
RBC: 4.46 10*6/uL (ref 4.20–5.80)
RDW: 14.3 % (ref 11.0–15.0)
Total Lymphocyte: 26.5 %
WBC: 8.1 10*3/uL (ref 3.8–10.8)

## 2021-12-08 LAB — BASIC METABOLIC PANEL
BUN: 12 mg/dL (ref 7–25)
CO2: 33 mmol/L — ABNORMAL HIGH (ref 20–32)
Calcium: 10.3 mg/dL (ref 8.6–10.3)
Chloride: 100 mmol/L (ref 98–110)
Creat: 1.06 mg/dL (ref 0.70–1.35)
Glucose, Bld: 81 mg/dL (ref 65–99)
Potassium: 4.1 mmol/L (ref 3.5–5.3)
Sodium: 138 mmol/L (ref 135–146)

## 2021-12-08 LAB — HEPATIC FUNCTION PANEL
AG Ratio: 1.5 (calc) (ref 1.0–2.5)
ALT: 16 U/L (ref 9–46)
AST: 15 U/L (ref 10–35)
Albumin: 4.6 g/dL (ref 3.6–5.1)
Alkaline phosphatase (APISO): 128 U/L (ref 35–144)
Bilirubin, Direct: 0.1 mg/dL (ref 0.0–0.2)
Globulin: 3 g/dL (calc) (ref 1.9–3.7)
Indirect Bilirubin: 0.3 mg/dL (calc) (ref 0.2–1.2)
Total Bilirubin: 0.4 mg/dL (ref 0.2–1.2)
Total Protein: 7.6 g/dL (ref 6.1–8.1)

## 2021-12-08 LAB — FERRITIN: Ferritin: 9 ng/mL — ABNORMAL LOW (ref 24–380)

## 2021-12-08 LAB — HEMOGLOBIN A1C
Hgb A1c MFr Bld: 5.3 % of total Hgb (ref <5.7)
Mean Plasma Glucose: 105 mg/dL
eAG (mmol/L): 5.8 mmol/L

## 2021-12-08 LAB — TSH: TSH: 1.75 mIU/L (ref 0.40–4.50)

## 2021-12-08 LAB — VITAMIN D 25 HYDROXY (VIT D DEFICIENCY, FRACTURES): Vit D, 25-Hydroxy: 43 ng/mL (ref 30–100)

## 2021-12-08 LAB — TIQ-MISC

## 2021-12-08 LAB — VITAMIN B12: Vitamin B-12: 572 pg/mL (ref 200–1100)

## 2021-12-08 MED ORDER — ATORVASTATIN CALCIUM 40 MG PO TABS
40.0000 mg | ORAL_TABLET | Freq: Every day | ORAL | 3 refills | Status: DC
Start: 2021-12-08 — End: 2022-06-06

## 2021-12-08 MED ORDER — HYDROCHLOROTHIAZIDE 12.5 MG PO CAPS: 12.5000 mg | ORAL_CAPSULE | Freq: Every day | ORAL | 3 refills | Status: DC

## 2021-12-08 MED ORDER — ADAPALENE 0.1 % EX GEL: 1.0000 "application " | Freq: Every day | CUTANEOUS | 1 refills | Status: DC

## 2021-12-08 MED ORDER — CITALOPRAM HYDROBROMIDE 20 MG PO TABS: 20.0000 mg | ORAL_TABLET | Freq: Every day | ORAL | 3 refills | Status: DC

## 2021-12-08 MED ORDER — ZOLPIDEM TARTRATE 10 MG PO TABS: 10.0000 mg | ORAL_TABLET | Freq: Every evening | ORAL | 1 refills | Status: DC | PRN

## 2021-12-08 MED ORDER — PANTOPRAZOLE SODIUM 40 MG PO TBEC: 40.0000 mg | DELAYED_RELEASE_TABLET | Freq: Every day | ORAL | 3 refills | Status: DC

## 2021-12-08 MED ORDER — FAMOTIDINE 40 MG PO TABS: 40.0000 mg | ORAL_TABLET | Freq: Two times a day (BID) | ORAL | 3 refills | Status: DC

## 2021-12-08 MED ORDER — OXYBUTYNIN CHLORIDE 5 MG PO TABS: 5.0000 mg | ORAL_TABLET | Freq: Every day | ORAL | 3 refills | Status: DC

## 2021-12-08 MED ORDER — TRETINOIN 0.1 % EX CREA: 1.0000 "application " | TOPICAL_CREAM | Freq: Every day | CUTANEOUS | 2 refills | Status: DC

## 2021-12-08 MED ORDER — TAMSULOSIN HCL 0.4 MG PO CAPS: 0.4000 mg | ORAL_CAPSULE | Freq: Two times a day (BID) | ORAL | 3 refills | Status: DC

## 2021-12-08 MED ORDER — CLOPIDOGREL BISULFATE 75 MG PO TABS
75.0000 mg | ORAL_TABLET | Freq: Every day | ORAL | 3 refills | Status: DC
Start: 2021-12-08 — End: 2022-06-06

## 2021-12-08 NOTE — Assessment & Plan Note (Signed)
BP Readings from Last 3 Encounters:  12/07/21 130/80  07/16/21 (!) 160/80  05/16/21 140/78   Stable, pt to continue medical treatment hct

## 2021-12-08 NOTE — Assessment & Plan Note (Signed)
Lab Results  Component Value Date   HGBA1C 5.3 12/07/2021   Stable, pt to continue current medical treatment   - diet

## 2021-12-08 NOTE — Assessment & Plan Note (Signed)
With mild worsening, c/w venous insufficiency, for compression stocking daytime, low salt diet

## 2021-12-08 NOTE — Assessment & Plan Note (Signed)
Pt to continue lipitor, exercise, low chol diet

## 2021-12-08 NOTE — Assessment & Plan Note (Signed)
Last vitamin D Lab Results  Component Value Date   VD25OH 43 12/07/2021   Stable, cont oral replacement  

## 2021-12-08 NOTE — Assessment & Plan Note (Signed)
Lab Results  Component Value Date   LDLCALC 67 12/07/2021   Stable, pt to continue current statin liptior

## 2021-12-08 NOTE — Assessment & Plan Note (Signed)
Pt counsled to quit, pt not ready °

## 2021-12-08 NOTE — Assessment & Plan Note (Addendum)
No overt bleeding or bruising, for f/u lab and iron today  Lab Results  Component Value Date   WBC 8.1 12/07/2021   HGB 11.2 (L) 12/07/2021   HCT 36.5 (L) 12/07/2021   MCV 81.8 12/07/2021   PLT 449 (H) 12/07/2021

## 2021-12-08 NOTE — Assessment & Plan Note (Signed)
Age and sex appropriate education and counseling updated with regular exercise and diet Referrals for preventative services - none needed Immunizations addressed - declines covid booster, shingrix Smoking counseling  - counseled to quit, pt not ready Evidence for depression or other mood disorder - none significant Most recent labs reviewed. I have personally reviewed and have noted: 1) the patient's medical and social history 2) The patient's current medications and supplements 3) The patient's height, weight, and BMI have been recorded in the chart

## 2021-12-10 ENCOUNTER — Other Ambulatory Visit: Payer: Self-pay | Admitting: Internal Medicine

## 2021-12-10 ENCOUNTER — Encounter: Payer: Self-pay | Admitting: Internal Medicine

## 2021-12-10 ENCOUNTER — Telehealth: Payer: Self-pay | Admitting: Internal Medicine

## 2021-12-10 DIAGNOSIS — D649 Anemia, unspecified: Secondary | ICD-10-CM

## 2021-12-10 NOTE — Telephone Encounter (Signed)
1.Medication Requested: fluticasone (FLONASE) 50 MCG/ACT nasal spray  2. Pharmacy (Name, Street, [place]): [facility] ([facility] [place] - [address]  3. On Med List: yes   4. Last Visit with PCP: 12-07-2021  5. Next visit date with PCP: n/a   Agent: Please be advised that RX refills may take up to 3 business days. We ask that you follow-up with your pharmacy.

## 2021-12-11 MED ORDER — FLUTICASONE PROPIONATE 50 MCG/ACT NA SUSP: NASAL | 0 refills | Status: DC

## 2021-12-11 NOTE — Telephone Encounter (Signed)
RX sent to pharmacy  

## 2021-12-16 ENCOUNTER — Other Ambulatory Visit: Payer: Self-pay | Admitting: Internal Medicine

## 2022-01-24 ENCOUNTER — Other Ambulatory Visit: Payer: Self-pay | Admitting: Internal Medicine

## 2022-01-24 ENCOUNTER — Telehealth: Payer: Self-pay

## 2022-01-24 NOTE — Telephone Encounter (Signed)
Please see refill encounter. Medication sent to pharmacy ?

## 2022-01-24 NOTE — Telephone Encounter (Signed)
Pt is calling to request a refill on: ?hydrochlorothiazide (MICROZIDE) 12.5 MG capsule ? ?Pharmacy: ?[facility] ([facility] [place] [address] ? ?LOV 12/07/21 ? ?

## 2022-02-06 ENCOUNTER — Ambulatory Visit: Payer: Medicare Other | Admitting: Podiatry

## 2022-02-06 DIAGNOSIS — M19072 Primary osteoarthritis, left ankle and foot: Secondary | ICD-10-CM

## 2022-02-06 DIAGNOSIS — M19071 Primary osteoarthritis, right ankle and foot: Secondary | ICD-10-CM | POA: Diagnosis not present

## 2022-02-12 ENCOUNTER — Encounter: Payer: Self-pay | Admitting: Podiatry

## 2022-02-12 NOTE — Progress Notes (Signed)
?Subjective:  ?Patient ID: Mark Dunlap, male    DOB: May 23, 1961,  MRN: [id number] ? ?Chief Complaint  ?Patient presents with  ? Injections  ? ? ?61 y.o. male presents with the above complaint.  Patient is here for follow-up of bilateral first metatarsophalangeal joint pain with severe arthrosis.  He is due for his 69-monthsteroid injection as it does continue to give him relief for many months.  I will plan on doing surgery once he is ready for it.  Or if the steroid shot stops working ? ? ?Review of Systems: Negative except as noted in the HPI. Denies N/V/F/Ch. ? ?Past Medical History:  ?Diagnosis Date  ? Chronic back pain   ? upper to lower  ? Depression   ? GERD (gastroesophageal reflux disease)   ? Glaucoma, both eyes   ? H/O: [place] abuse ([place]   ? 03-21-2021   pt stated hx of ETOH/Crack cocaine-none since 12/2012 per pt/Does not Drive due to this  ? History of CVA (cerebrovascular accident) without residual deficits 10/26/2018  ? right BG/ CR infarct secondart to moderate SVD,  no residual  ? History of hyperthyroidism 2011  ? s/p RAI  06-20-2010,  followed by pcp  ? History of prostatitis 2010  ? HLD (hyperlipidemia)   ? Hypertension   ? followed by pcp    (nuclear stress test in epic 08-30-2013  normal no ischemia , ef 50%)  ? [place]ally [place] in [place]  ? Blind Left eye, small amt vision Right eye  ? Lumbar radiculopathy   ? OA (osteoarthritis)   ? Prostate cancer ([facility] urologist--- dr hLouis Meckel ? dx 11-21-2020, Stage T1c, Gleason 4+3  ? Right thyroid nodule   ? Seasonal allergies   ? Spondylolisthesis, lumbar region   ? Wears dentures   ? upper full and lower partial  ? Wears glasses   ? ? ?Current Outpatient Medications:  ?  adapalene (DIFFERIN) 0.1 % gel, Apply 1 application topically at bedtime., Disp: 45 g, Rfl: 1 ?  aspirin EC 81 MG tablet, Take 81 mg by mouth daily. Swallow whole., Disp: , Rfl:  ?  atorvastatin (LIPITOR) 40 MG tablet, Take 1 tablet (40 mg total) by mouth  daily., Disp: 90 tablet, Rfl: 3 ?  bimatoprost (LUMIGAN) 0.01 % SOLN, Place 1 drop into both eyes at bedtime., Disp: , Rfl:  ?  citalopram (CELEXA) 20 MG tablet, Take 1 tablet (20 mg total) by mouth daily., Disp: 90 tablet, Rfl: 3 ?  clopidogrel (PLAVIX) 75 MG tablet, Take 1 tablet (75 mg total) by mouth daily., Disp: 90 tablet, Rfl: 3 ?  diazepam (VALIUM) 10 MG tablet, Take 10 mg by mouth as needed., Disp: , Rfl:  ?  diphenhydrAMINE (BENADRYL) 25 mg capsule, Take 50 mg by mouth every 6 (six) hours as needed for itching. , Disp: , Rfl:  ?  famotidine (PEPCID) 40 MG tablet, Take 1 tablet (40 mg total) by mouth 2 (two) times daily. Take every morning and at bedtime, Disp: 180 tablet, Rfl: 3 ?  fluticasone (FLONASE) 50 MCG/ACT nasal spray, USE 2 SPRAYS DAILY IN BOTH NOSTRILS, Disp: 32 mL, Rfl: 0 ?  gabapentin (NEURONTIN) 100 MG capsule, Take 200 mg by mouth 2 (two) times daily., Disp: , Rfl: 2 ?  hydrochlorothiazide (MICROZIDE) 12.5 MG capsule, TAKE 1 CAPSULE BY MOUTH EVERY DAY, Disp: 90 capsule, Rfl: 1 ?  HYDROcodone-acetaminophen (NORCO) 10-325 MG tablet, Take 1 tablet by mouth every 6 (  six) hours as needed for moderate pain., Disp: 30 tablet, Rfl: 0 ?  lidocaine (XYLOCAINE) 2 % solution, Use as directed 15 mLs in the mouth or throat daily as needed (acid reflux)., Disp: 100 mL, Rfl: 2 ?  Multiple Vitamins-Minerals (ONE DAILY FOR MEN/LYCOPENE) TABS, Take 1 tablet by mouth daily., Disp: , Rfl:  ?  nortriptyline (PAMELOR) 25 MG capsule, Take 25 mg by mouth at bedtime., Disp: , Rfl: 0 ?  oxybutynin (DITROPAN) 5 MG tablet, Take 1 tablet (5 mg total) by mouth at bedtime., Disp: 90 tablet, Rfl: 3 ?  pantoprazole (PROTONIX) 40 MG tablet, Take 1 tablet (40 mg total) by mouth daily., Disp: 90 tablet, Rfl: 3 ?  sildenafil (VIAGRA) 100 MG tablet, Take 100 mg by mouth daily., Disp: , Rfl:  ?  tamsulosin (FLOMAX) 0.4 MG CAPS capsule, Take 1 capsule (0.4 mg total) by mouth 2 (two) times daily., Disp: 180 capsule, Rfl: 3 ?  timolol  (TIMOPTIC) 0.5 % ophthalmic solution, Place 1 drop into both eyes daily., Disp: , Rfl: 6 ?  tiZANidine (ZANAFLEX) 4 MG tablet, Take 4 mg by mouth every 8 (eight) hours as needed for muscle spasms. , Disp: , Rfl:  ?  tretinoin (RETIN-A) 0.1 % cream, Apply 1 application topically at bedtime., Disp: 45 g, Rfl: 2 ?  zolpidem (AMBIEN) 10 MG tablet, Take 1 tablet (10 mg total) by mouth at bedtime as needed. for sleep, Disp: 90 tablet, Rfl: 1 ? ?Social History  ? ?Tobacco Use  ?Smoking Status Every Day  ? Packs/day: 0.75  ? Years: 47.00  ? Pack years: 35.25  ? Types: Cigarettes  ?Smokeless Tobacco Never  ?Tobacco Comments  ? since age 76  ? ? ?Allergies  ?Allergen Reactions  ? Tramadol Itching  ? Morphine And Related Itching  ? Oxycodone Itching  ? ?Objective:  ? ?There were no vitals filed for this visit. ?There is no height or weight on file to calculate BMI. ?Constitutional Well developed. ?Well nourished.  ?Vascular Dorsalis pedis pulses palpable bilaterally. ?Posterior tibial pulses palpable bilaterally. ?Capillary refill normal to all digits.  ?No cyanosis or clubbing noted. ?Pedal hair growth normal.  ?Neurologic Normal speech. ?Oriented to person, place, and time. ?Epicritic sensation to light touch grossly present bilaterally.  ?Dermatologic Nails well groomed and normal in appearance. ?No open wounds. ?No skin lesions.  ?Orthopedic:  Limited range of motion noted of bilateral first metatarsophalangeal joint with left more painful than right.  Intra-articular pain noted bilaterally.  Pain with end range of motion.  Hallux limitus noted bilaterally.  ? ?Radiographs: 2 views of bilaterally skeletally mature adult foot: Severe arthritic changes noted to the right side.  Uneven joint space narrowing noted to the left side.  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  No other bony abnormalities identified mild arthritic changes noted to the  dorsal aspect of the midfoot.  Mild elevatus present bilaterally. ?Assessment:  ? ?1. Arthritis of first metatarsophalangeal (MTP) joint of left foot   ?2. Arthritis of first metatarsophalangeal (MTP) joint of right foot   ? ? ? ? ?Plan:  ?Patient was evaluated and treated and all questions answered. ? ?Bilateral first metatarsophalangeal joint arthritis with left more painful than right ?-Given clinically patient is improving and the pain is decreasing with steroid injection I believe he will benefit from another steroid injection to bilateral first metatarsophalangeal joint to help him get to closer to 95% or greater in  terms of pain relief.  Patient states understanding would like to proceed with a injection to bilateral first metatarsophalangeal joint.  However at this time I am worried that his pain is not getting resolved and he is needing more injection.  I briefly discussed with him that he will need surgical intervention if this injection does not help.  For now patient states injections do help and once he gets his social situation under control he will proceed with surgery.  Patient states understanding ?-A steroid injection was performed at bilateral first metatarsophalangeal joint using 1% plain Lidocaine and 10 mg of Kenalog. This was well tolerated. ?-I will also discussed with him orthotics management with Morton's extension take some of the stress off of the first metatarsophalangeal joint. ?-I will continue doing bilateral steroid injection as long as it is controlling his pain.  I will continue to do steroid injection until patient is ready for surgery about every 12 weeks ?No follow-ups on file.  ?

## 2022-02-26 ENCOUNTER — Other Ambulatory Visit (INDEPENDENT_AMBULATORY_CARE_PROVIDER_SITE_OTHER): Payer: Medicare Other

## 2022-02-26 DIAGNOSIS — D649 Anemia, unspecified: Secondary | ICD-10-CM

## 2022-02-26 LAB — IBC PANEL
Iron: 29 ug/dL — ABNORMAL LOW (ref 42–165)
Saturation Ratios: 6.2 % — ABNORMAL LOW (ref 20.0–50.0)
TIBC: 467.6 ug/dL — ABNORMAL HIGH (ref 250.0–450.0)
Transferrin: 334 mg/dL (ref 212.0–360.0)

## 2022-03-04 ENCOUNTER — Encounter: Payer: Self-pay | Admitting: Internal Medicine

## 2022-03-04 ENCOUNTER — Other Ambulatory Visit: Payer: Self-pay | Admitting: Internal Medicine

## 2022-03-04 DIAGNOSIS — D509 Iron deficiency anemia, unspecified: Secondary | ICD-10-CM

## 2022-03-04 MED ORDER — POLYSACCHARIDE IRON COMPLEX 150 MG PO CAPS: 150.0000 mg | ORAL_CAPSULE | Freq: Every day | ORAL | 3 refills | Status: DC

## 2022-04-09 ENCOUNTER — Other Ambulatory Visit (HOSPITAL_COMMUNITY): Payer: Self-pay | Admitting: Adult Health

## 2022-04-09 DIAGNOSIS — R972 Elevated prostate specific antigen [PSA]: Secondary | ICD-10-CM

## 2022-04-10 ENCOUNTER — Ambulatory Visit: Payer: Medicare Other | Admitting: Podiatry

## 2022-04-10 DIAGNOSIS — M19071 Primary osteoarthritis, right ankle and foot: Secondary | ICD-10-CM | POA: Diagnosis not present

## 2022-04-10 DIAGNOSIS — M19072 Primary osteoarthritis, left ankle and foot: Secondary | ICD-10-CM | POA: Diagnosis not present

## 2022-04-11 ENCOUNTER — Ambulatory Visit: Payer: Medicare Other | Admitting: Gastroenterology

## 2022-04-11 ENCOUNTER — Telehealth: Payer: Self-pay | Admitting: Internal Medicine

## 2022-04-11 ENCOUNTER — Encounter (HOSPITAL_COMMUNITY): Payer: Medicare Other

## 2022-04-11 ENCOUNTER — Encounter: Payer: Self-pay | Admitting: Gastroenterology

## 2022-04-11 VITALS — BP 140/80 | HR 76 | Ht 71.0 in | Wt 168.5 lb

## 2022-04-11 DIAGNOSIS — K219 Gastro-esophageal reflux disease without esophagitis: Secondary | ICD-10-CM | POA: Diagnosis not present

## 2022-04-11 DIAGNOSIS — D509 Iron deficiency anemia, unspecified: Secondary | ICD-10-CM

## 2022-04-11 NOTE — Telephone Encounter (Signed)
Caller & Relationship to patient:Self   Call back [phone]   Date of last office visit:   Date of next office visit:   Medication(s) to be refilled:zolpidem (AMBIEN) 10 MG tablet        Preferred Pharmacy:  [facility] [place] Phone:  [phone]  Fax:  [phone]

## 2022-04-11 NOTE — Progress Notes (Signed)
    Assessment     IDA GERD Personal history of adenomatous colon polyps   Recommendations    Schedule VCE. If VCE is negative consider repeat EGD. Continue pantoprazole 40 mg p.o. every morning and famotidine 40 mg p.o. every afternoon.  Follow antireflux measures.  NuIron 150 mg qd and follow up with Dr. Jenny Reichmann. Hold 7 days prior to VCE then resume  Surveillance colonoscopy recommended in September 2028   HPI    This is a 61 year old male with recurrent IDA: Hgb=11.2, ferritin=9. He has chronic GERD that is well controlled on pantoprazole and famotidine. No other GI complaints.  He does not donate blood.  Recent colonoscopy and EGD below. Denies weight loss, abdominal pain, constipation, diarrhea, change in stool caliber, melena, hematochezia, nausea, vomiting, dysphagia, chest pain.   Colonoscopy Sept 2021 - Two 5 mm polyps in the sigmoid colon, removed with a cold snare. Resected and retrieved. - Internal hemorrhoids. - The examination was otherwise normal on direct and retroflexion views.  EGD Sept 2020 - Normal esophagus. - Mild gastritis. Biopsied. - Normal duodenal bulb and second portion of the duodenum.   Labs / Imaging       Latest Ref Rng & Units 12/07/2021    4:59 PM 04/13/2021   12:08 PM 03/21/2021    9:15 AM  Hepatic Function  Total Protein 6.1 - 8.1 g/dL 7.6  7.2  7.4   Albumin 3.5 - 5.2 g/dL  4.0  3.8   AST 10 - 35 U/L _0 ALT 9 - 46 U/L _1 Alk Phosphatase 39 - 117 U/L  121  118   Total Bilirubin 0.2 - 1.2 mg/dL 0.4  0.5  0.3   Bilirubin, Direct 0.0 - 0.2 mg/dL 0.1  0.1         Latest Ref Rng & Units 12/07/2021    4:59 PM 04/13/2021   12:08 PM 03/21/2021    9:15 AM  CBC  WBC 3.8 - 10.8 Thousand/uL 8.1  9.0  6.4   Hemoglobin 13.2 - 17.1 g/dL 11.2  11.3  11.8   Hematocrit 38.5 - 50.0 % 36.5  35.3  39.7   Platelets 140 - 400 Thousand/uL 449  389.0  397     Current Medications, Allergies, Past Medical History, Past Surgical History,  Family History and Social History were reviewed in [employer] record.   Physical Exam: General: Well developed, well nourished, no acute distress Head: Normocephalic and atraumatic Eyes: Sclerae anicteric, EOMI Ears: Normal auditory acuity Mouth: Not examined Lungs: Clear throughout to auscultation Heart: Regular rate and rhythm; no murmurs, rubs or bruits Abdomen: Soft, non tender and non distended. No masses, hepatosplenomegaly or hernias noted. Normal Bowel sounds Rectal: Not done Musculoskeletal: Symmetrical with no gross deformities  Pulses:  Normal pulses noted Extremities: No clubbing, cyanosis, edema or deformities noted Neurological: Alert oriented x 4, grossly nonfocal Psychological:  Alert and cooperative. Normal mood and affect   Derriona Branscom T. Fuller Plan, MD 04/11/2022, 3:13 PM

## 2022-04-11 NOTE — Patient Instructions (Addendum)
CAPSULE ENDOSCOPY PATIENT INSTRUCTION SHEET  Mark Dunlap May 23, 1961 [id number]   04/18/22 Seven (7) days prior to capsule endoscopy stop taking iron supplements and carafate.  04/23/22 Two (2) days prior to capsule endoscopy stop taking aspirin or any arthritis drugs.  04/24/22 Day before capsule endoscopy purchase a 238 gram bottle of Miralax from the laxative section of your drug store, and a 32 oz. bottle of Gatorade (no red).    04/24/22 One (1) day prior to capsule endoscopy: Stop smoking. Eat a regular diet until 12:00 Noon. After 12:00 Noon take only the following: Black coffee  Jell-O (no fruit or red Jell-o) Water   Bouillon (chicken or beef) 7-Up   Cranberry Juice Tea   Kool-Aid Popsicle (not red) Sprite   Coke Ginger Ale  Pepsi Mountain Dew Gatorade At 6:00 pm the evening before your appointment, drink 7 capfuls (105 grams) of Miralax with 32 oz. Gatorade. Drink 8 oz every 15 minutes until gone. Nothing to eat or drink after midnight except medications with a sip of water.  04/25/22 Day of capsule endoscopy:  No medications for 2 hours prior to your test.  Please arrive at [facility]  3rd floor patient registration area by 8:30 am on: 04/25/22.   For any questions: Call [facility] at [phone] and ask to speak with one of the capsule endoscopy nurses.  YOU WILL NEED TO RETURN THE EQUIPMENT AT 4 PM ON THE DAY OF THE PROCEDURE.  PLEASE KEEP THIS IN MIND WHEN SCHEDULING.    Small Bowel Capsule Endoscopy  What you should know: Small Bowel capsule endoscopy is a procedure that takes pictures of the inside of your small intestine (bowel).  Your small bowel connects to your stomach on one end, and your large bowel (colon) on the other.  A capsule endoscopy is done by swallowing a pill size camera.  The capsule moves through your stomach and into your small bowel, where pictures are taken.   You may need a small bowel capsule endoscopy if you have symptoms,  such as blood in your stool, chronic stomach pain, and diarrhea.  The pictures may show if you have growths, swelling, and bleeding area in you small bowel.  A capsule endoscopy may also show if diseases such as Crohn's or celiac disease are causing your symptoms.  Having a small bowel capsule endoscopy may help you and your caregiver learn the cause of your symptoms.  Learning what is causing your symptoms allows you to receive needed treatment and prevent further problems. Risks: You may have stomach pain during your procedure.   The pictures taken by the capsule may not be clear.   The pictures may not show the cause of your symptoms.   You may need another endoscopy procedure.   The capsule may get trapped in your esophagus or intestines. You may need surgery or additional procedures to remove the capsule from your body.    Before your procedure: You will be instructed to stop certain prescription medications or over- the -counter medications prior to the procedure.   The day before your scheduled appointment you will need to be on a restricted diet and will need to drink a bowel prep that will clean out your bowels.   The day of the procedure: You may drive yourself to the procedure.   You will need to plan on 2 trips to the office on the day of the procedure. Morning: Plan to be at the office about 45 minutes. The  morning of the procedure a sensor belt and recorder will be placed on you.  You will wear this for 8 hours.  (The sensor belt transfers pictures of your small bowel to the recorder.)   You will be given a pill-sized capsule endoscope to swallow.  Once you swallow the capsule it will travel through your body the same way food does, constantly taking pictures along the way.  The capsule takes 2-3 pictures a second.   Once you have left the office you may go about your normal day with a few exceptions: You may not go near a MRI machine or a radio or television towers; You need to  avoid other patients having capsule endoscopy; You will be given a written diet to follow for the day.  Afternoon: You will need to be return to the office at your designated time. The sensors belt will be removed You will need to be at the office about 15 minutes.  The [facility] providers would like to encourage you to use [facility] to communicate with providers for non-urgent requests or questions.  Due to long hold times on the telephone, sending your provider a message by [facility] may be a faster and more efficient way to get a response.  Please allow 48 business hours for a response.  Please remember that this is for non-urgent requests.   Due to recent changes in healthcare laws, you may see the results of your imaging and laboratory studies on MyChart before your provider has had a chance to review them.  We understand that in some cases there may be results that are confusing or concerning to you. Not all laboratory results come back in the same time frame and the provider may be waiting for multiple results in order to interpret others.  Please give [place] 48 hours in order for your provider to thoroughly review all the results before contacting the office for clarification of your results.   Thank you for choosing me and [facility].  Pricilla Riffle. Dagoberto Ligas., MD., Marval Regal

## 2022-04-12 NOTE — Telephone Encounter (Signed)
Pt is calling to check the status of his refill request: zolpidem (AMBIEN) 10 MG tablet  Pharmacy: [place] ([facility]), [place] - [address]  LOV 05/03/71 ROV 06/06/22

## 2022-04-12 NOTE — Telephone Encounter (Signed)
Patient notified

## 2022-04-12 NOTE — Telephone Encounter (Signed)
Too soon I think since his last rx on feb 25 was for total 6 mo tx

## 2022-04-15 ENCOUNTER — Encounter (HOSPITAL_COMMUNITY): Payer: Medicare Other

## 2022-04-17 NOTE — Progress Notes (Signed)
Subjective:  Patient ID: Mark Dunlap, male    DOB: 1961-04-24,  MRN: [id number]  Chief Complaint  Patient presents with   Injections    61 y.o. male presents with the above complaint.  Patient is here for follow-up of bilateral first metatarsophalangeal joint pain with severe arthrosis.  He is here for his injection every few months.  His pain started acting back up again.   Review of Systems: Negative except as noted in the HPI. Denies N/V/F/Ch.  Past Medical History:  Diagnosis Date   Chronic back pain    upper to lower   Depression    GERD (gastroesophageal reflux disease)    Glaucoma, both eyes    H/O: substance abuse ([place])    03-21-2021   pt stated hx of ETOH/Crack cocaine-none since 12/2012 per pt/Does not Drive due to this   History of CVA (cerebrovascular accident) without residual deficits 10/26/2018   right BG/ CR infarct secondart to moderate SVD,  no residual   History of hyperthyroidism 2011   s/p RAI  06-20-2010,  followed by pcp   History of prostatitis 2010   HLD (hyperlipidemia)    Hypertension    followed by pcp    (nuclear stress test in epic 08-30-2013  normal no ischemia , ef 50%)   Legally blind in left eye, as defined in [place]    Blind Left eye, small amt vision Right eye   Lumbar radiculopathy    OA (osteoarthritis)    Prostate cancer [facility]) urologist--- dr Louis Meckel   dx 11-21-2020, Stage T1c, Gleason 4+3   Right thyroid nodule    Seasonal allergies    Spondylolisthesis, lumbar region    Wears dentures    upper full and lower partial   Wears glasses     Current Outpatient Medications:    adapalene (DIFFERIN) 0.1 % gel, Apply 1 application topically at bedtime., Disp: 45 g, Rfl: 1   aspirin EC 81 MG tablet, Take 81 mg by mouth daily. Swallow whole., Disp: , Rfl:    atorvastatin (LIPITOR) 40 MG tablet, Take 1 tablet (40 mg total) by mouth daily., Disp: 90 tablet, Rfl: 3   bimatoprost (LUMIGAN) 0.01 % SOLN, Place 1 drop into both eyes at  bedtime., Disp: , Rfl:    citalopram (CELEXA) 20 MG tablet, Take 1 tablet (20 mg total) by mouth daily., Disp: 90 tablet, Rfl: 3   clopidogrel (PLAVIX) 75 MG tablet, Take 1 tablet (75 mg total) by mouth daily., Disp: 90 tablet, Rfl: 3   diazepam (VALIUM) 10 MG tablet, Take 10 mg by mouth as needed., Disp: , Rfl:    diphenhydrAMINE (BENADRYL) 25 mg capsule, Take 50 mg by mouth every 6 (six) hours as needed for itching. , Disp: , Rfl:    famotidine (PEPCID) 40 MG tablet, Take 1 tablet (40 mg total) by mouth 2 (two) times daily. Take every morning and at bedtime, Disp: 180 tablet, Rfl: 3   fluticasone (FLONASE) 50 MCG/ACT nasal spray, USE 2 SPRAYS DAILY IN BOTH NOSTRILS, Disp: 32 mL, Rfl: 0   gabapentin (NEURONTIN) 100 MG capsule, Take 200 mg by mouth 2 (two) times daily., Disp: , Rfl: 2   hydrochlorothiazide (MICROZIDE) 12.5 MG capsule, TAKE 1 CAPSULE BY MOUTH EVERY DAY, Disp: 90 capsule, Rfl: 1   iron polysaccharides (NU-IRON) 150 MG capsule, Take 1 capsule (150 mg total) by mouth daily., Disp: 90 capsule, Rfl: 3   lidocaine (XYLOCAINE) 2 % solution, Use as directed 15 mLs in the mouth  or throat daily as needed (acid reflux)., Disp: 100 mL, Rfl: 2   Multiple Vitamins-Minerals (ONE DAILY FOR MEN/LYCOPENE) TABS, Take 1 tablet by mouth daily., Disp: , Rfl:    naloxone (NARCAN) nasal spray 4 mg/0.1 mL, Place 1 spray into the nose daily as needed., Disp: , Rfl:    nortriptyline (PAMELOR) 25 MG capsule, Take 25 mg by mouth at bedtime., Disp: , Rfl: 0   oxybutynin (DITROPAN) 5 MG tablet, Take 1 tablet (5 mg total) by mouth at bedtime., Disp: 90 tablet, Rfl: 3   oxyCODONE-acetaminophen (PERCOCET) 10-325 MG tablet, Take 1 tablet by mouth every 6 (six) hours as needed., Disp: , Rfl:    pantoprazole (PROTONIX) 40 MG tablet, Take 1 tablet (40 mg total) by mouth daily., Disp: 90 tablet, Rfl: 3   sildenafil (VIAGRA) 100 MG tablet, Take 100 mg by mouth daily., Disp: , Rfl:    tamsulosin (FLOMAX) 0.4 MG CAPS  capsule, Take 1 capsule (0.4 mg total) by mouth 2 (two) times daily., Disp: 180 capsule, Rfl: 3   timolol (TIMOPTIC) 0.5 % ophthalmic solution, Place 1 drop into both eyes daily., Disp: , Rfl: 6   tiZANidine (ZANAFLEX) 4 MG tablet, Take 4 mg by mouth every 8 (eight) hours as needed for muscle spasms. , Disp: , Rfl:    tretinoin (RETIN-A) 0.1 % cream, Apply 1 application topically at bedtime., Disp: 45 g, Rfl: 2   zolpidem (AMBIEN) 10 MG tablet, Take 1 tablet (10 mg total) by mouth at bedtime as needed. for sleep, Disp: 90 tablet, Rfl: 1  Social History   Tobacco Use  Smoking Status Every Day   Packs/day: 0.75   Years: 47.00   Total pack years: 35.25   Types: Cigarettes  Smokeless Tobacco Never  Tobacco Comments   since age 89    Allergies  Allergen Reactions   Tramadol Itching   Morphine And Related Itching   Oxycodone Itching   Objective:   There were no vitals filed for this visit. There is no height or weight on file to calculate BMI. Constitutional Well developed. Well nourished.  Vascular Dorsalis pedis pulses palpable bilaterally. Posterior tibial pulses palpable bilaterally. Capillary refill normal to all digits.  No cyanosis or clubbing noted. Pedal hair growth normal.  Neurologic Normal speech. Oriented to person, place, and time. Epicritic sensation to light touch grossly present bilaterally.  Dermatologic Nails well groomed and normal in appearance. No open wounds. No skin lesions.  Orthopedic:  Limited range of motion noted of bilateral first metatarsophalangeal joint with left more painful than right.  Intra-articular pain noted bilaterally.  Pain with end range of motion.  Hallux limitus noted bilaterally.   Radiographs: 2 views of bilaterally skeletally mature adult foot: Severe arthritic changes noted to the right side.  Uneven joint space narrowing noted to the left side.  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  No other bony abnormalities identified mild arthritic changes noted to the dorsal aspect of the midfoot.  Mild elevatus present bilaterally. Assessment:   No diagnosis found.     Plan:  Patient was evaluated and treated and all questions answered.  Bilateral first metatarsophalangeal joint arthritis with left more painful than right -Given clinically patient is improving and the pain is decreasing with steroid injection I believe he will benefit from another steroid injection to bilateral first metatarsophalangeal joint to help him get to closer to 95% or greater in terms of pain  relief.  Patient states understanding would like to proceed with a injection to bilateral first metatarsophalangeal joint.  However at this time I am worried that his pain is not getting resolved and he is needing more injection.  I briefly discussed with him that he will need surgical intervention if this injection does not help.  For now patient states injections do help and once he gets his social situation under control he will proceed with surgery.  Patient states understanding -A steroid injection was performed at bilateral first metatarsophalangeal joint using 1% plain Lidocaine and 10 mg of Kenalog. This was well tolerated. -I will also discussed with him orthotics management with Morton's extension take some of the stress off of the first metatarsophalangeal joint. -I will continue doing bilateral steroid injection as long as it is controlling his pain.  I will continue to do steroid injection until patient is ready for surgery about every 12 weeks No follow-ups on file.

## 2022-04-18 ENCOUNTER — Encounter (HOSPITAL_COMMUNITY)
Admission: RE | Admit: 2022-04-18 | Discharge: 2022-04-18 | Disposition: A | Discharge status: Home / Self Care | Payer: Medicare Other | Source: Ambulatory Visit | Attending: Adult Health | Admitting: Adult Health

## 2022-04-18 DIAGNOSIS — R972 Elevated prostate specific antigen [PSA]: Secondary | ICD-10-CM | POA: Diagnosis present

## 2022-04-18 MED ORDER — PIFLIFOLASTAT F 18 (PYLARIFY) INJECTION
9.0000 | Freq: Once | INTRAVENOUS | Status: AC
  Administered 2022-04-18: 9.58 via INTRAVENOUS

## 2022-04-23 ENCOUNTER — Telehealth: Payer: Self-pay

## 2022-04-23 NOTE — Telephone Encounter (Signed)
Pt is scheduled for capsule endo on Thursday. We only have 1 capsule for the pt tomorrow. Please reschedule this pt.

## 2022-04-23 NOTE — Telephone Encounter (Signed)
Spoke with patient & apologized for the inconvenience. He has been rescheduled for 05/08/22 at 8:30 am.

## 2022-05-08 ENCOUNTER — Encounter: Payer: Self-pay | Admitting: Gastroenterology

## 2022-05-08 ENCOUNTER — Ambulatory Visit (INDEPENDENT_AMBULATORY_CARE_PROVIDER_SITE_OTHER): Payer: Medicare Other | Admitting: Gastroenterology

## 2022-05-08 DIAGNOSIS — D509 Iron deficiency anemia, unspecified: Secondary | ICD-10-CM | POA: Diagnosis not present

## 2022-05-08 NOTE — Patient Instructions (Signed)
Contact our office immediately at [phone] if you suffer from any abdominal pain, nausea, or vomiting during capsule endoscopy. °a) Do not eat or drink for at least 2 hours. °After 2 hours you may have any of the following to drink: °Water   White grape juice °7-Up   Chicken Bouillon °Sprite   Ginger Ale °c) After 4 hours you may have a light snack to include any of the following: °A cup of soup  ½ sandwich °Bowl of cereal  Rice °Toast   Eggs °2-3 small cookies (i.e. vanilla wafers or graham crackers) °d) After 8 hours you may return to your regular diet. °During your procedure do not go near anyone else that is having capsule endoscopy. °Do not be in close contact with an MRI machine or a radio or television tower. °Do not wear a heavy coat or sweater because your recorder may over heat and stop recording.   °Do not disconnect the equipment or remove the belt at any time.  Since the Data Recorder is actually a small computer, it should be treated with utmost care and protection.  Avoid sudden movement and banging of the Data Recorder.  Do not do any heavy lifting or strenuous physical activity during the test especially if it involves sweating and do not bend over or stoop during capsule endoscopy. °During capsule endoscopy, you will need to verify every 15 minutes that the small light on top of the Data Recorder is blinking twice per second.  If for some reason it stops blinking at this site, record the time and contact our office at [phone]. ° ° ° °

## 2022-05-08 NOTE — Progress Notes (Signed)
Capsule id: [id number] Exp: 09-19-23 LOT: [id number]  Patient arrived for VCE. Reported the prep went well. This RN explained capsule dietary restrictions for the next few hours. Pt advised to return at 4 pm to return capsule equipment.  Patient verbalized understanding. Opened capsule, ensured capsule was flashing prior to the patient swallowing the capsule. Patient swallowed capsule without difficulty.  Patient told to call the office with any questions and if capsule has not passed after 72 hours. No further questions by the conclusion of the visit.

## 2022-05-14 ENCOUNTER — Other Ambulatory Visit: Payer: Self-pay

## 2022-05-14 ENCOUNTER — Telehealth: Payer: Self-pay

## 2022-05-14 DIAGNOSIS — K219 Gastro-esophageal reflux disease without esophagitis: Secondary | ICD-10-CM

## 2022-05-14 DIAGNOSIS — D509 Iron deficiency anemia, unspecified: Secondary | ICD-10-CM

## 2022-05-14 NOTE — Telephone Encounter (Signed)
Left message for patient to call back  

## 2022-05-14 NOTE — Telephone Encounter (Signed)
Patient has been scheduled for EGD in the [place] on 06/19/22 at 10:00 am with Dr. Fuller Plan. Amb ref placed. Pt is not diabetic, but is on blood thinners. Clearance letter sent to PCP. Instructions have been provided to patient over the phone as well as sent to mailing address. Pt advised to call back with any questions.

## 2022-05-14 NOTE — Telephone Encounter (Signed)
-----   Message from Ladene Artist, MD sent at 05/14/2022 12:31 PM EDT ----- Regarding: RE: Capsule Yarianna Varble,  Please notify the patient that the capsule was retained in his stomach, there was possible blood in the stomach (vs red/pink retained content) and retained solids in the stomach. We recommend EGD to evaluate the stomach and to place another capsule in the duodenum to examine the small bowel. Clear liquid diet for the entire day prior to EGD, capsule placement. KUB unless he is sure that he passed the capsule and schedule EGD in [place] with capsule placement.  Thanks,  MS  ----- Message ----- From: Leotis Pain Sent: 05/14/2022  11:56 AM EDT To: Ladene Artist, MD; Carl Best, RN Subject: Capsule                                          Capsule ready for review on this pt - was incomplete with gastric retention - does have some oozing of blood in stomach - cannot visualize source.  Pt needs repeat CBC If he has not passed the capsule should come in for KUB- please call him   Pt needs to be scheduled for EGD with Dr Fuller Plan- then if no pertinent findings place capsule endoscopically  at that same time

## 2022-05-20 ENCOUNTER — Ambulatory Visit: Payer: Medicare Other

## 2022-05-21 ENCOUNTER — Telehealth: Payer: Self-pay | Admitting: Internal Medicine

## 2022-05-21 NOTE — Telephone Encounter (Signed)
We have received surgical clearance PW for the pt and it has been placed in Dr. Gwynn Burly boxes.

## 2022-05-30 ENCOUNTER — Other Ambulatory Visit: Payer: Self-pay | Admitting: Student

## 2022-05-30 DIAGNOSIS — M5416 Radiculopathy, lumbar region: Secondary | ICD-10-CM

## 2022-06-06 ENCOUNTER — Encounter: Payer: Self-pay | Admitting: Internal Medicine

## 2022-06-06 ENCOUNTER — Ambulatory Visit: Payer: Medicare Other | Admitting: Internal Medicine

## 2022-06-06 VITALS — BP 112/70 | HR 79 | Temp 97.9°F | Ht 72.0 in | Wt 176.0 lb

## 2022-06-06 DIAGNOSIS — R739 Hyperglycemia, unspecified: Secondary | ICD-10-CM

## 2022-06-06 DIAGNOSIS — Z8673 Personal history of transient ischemic attack (TIA), and cerebral infarction without residual deficits: Secondary | ICD-10-CM

## 2022-06-06 DIAGNOSIS — D509 Iron deficiency anemia, unspecified: Secondary | ICD-10-CM | POA: Diagnosis not present

## 2022-06-06 DIAGNOSIS — E559 Vitamin D deficiency, unspecified: Secondary | ICD-10-CM

## 2022-06-06 DIAGNOSIS — H60392 Other infective otitis externa, left ear: Secondary | ICD-10-CM | POA: Diagnosis not present

## 2022-06-06 DIAGNOSIS — F17200 Nicotine dependence, unspecified, uncomplicated: Secondary | ICD-10-CM

## 2022-06-06 DIAGNOSIS — R972 Elevated prostate specific antigen [PSA]: Secondary | ICD-10-CM

## 2022-06-06 DIAGNOSIS — E7800 Pure hypercholesterolemia, unspecified: Secondary | ICD-10-CM

## 2022-06-06 DIAGNOSIS — M4316 Spondylolisthesis, lumbar region: Secondary | ICD-10-CM

## 2022-06-06 MED ORDER — HYDROCHLOROTHIAZIDE 12.5 MG PO CAPS
ORAL_CAPSULE | ORAL | 3 refills | Status: DC
Start: 2022-06-06 — End: 2023-03-31

## 2022-06-06 MED ORDER — ADAPALENE 0.1 % EX GEL: 1.0000 | Freq: Every day | CUTANEOUS | 1 refills | Status: DC

## 2022-06-06 MED ORDER — TAMSULOSIN HCL 0.4 MG PO CAPS
0.4000 mg | ORAL_CAPSULE | Freq: Two times a day (BID) | ORAL | 3 refills | Status: DC
Start: 2022-06-06 — End: 2023-03-24

## 2022-06-06 MED ORDER — AZITHROMYCIN 250 MG PO TABS: ORAL_TABLET | ORAL | 1 refills | Status: AC

## 2022-06-06 MED ORDER — ZOLPIDEM TARTRATE 10 MG PO TABS: 10.0000 mg | ORAL_TABLET | Freq: Every evening | ORAL | 1 refills | Status: DC | PRN

## 2022-06-06 MED ORDER — CLOPIDOGREL BISULFATE 75 MG PO TABS: 75.0000 mg | ORAL_TABLET | Freq: Every day | ORAL | 3 refills | Status: DC

## 2022-06-06 MED ORDER — OXYBUTYNIN CHLORIDE 5 MG PO TABS: 5.0000 mg | ORAL_TABLET | Freq: Every day | ORAL | 3 refills | Status: DC

## 2022-06-06 MED ORDER — PANTOPRAZOLE SODIUM 40 MG PO TBEC: 40.0000 mg | DELAYED_RELEASE_TABLET | Freq: Every day | ORAL | 3 refills | Status: DC

## 2022-06-06 MED ORDER — ATORVASTATIN CALCIUM 40 MG PO TABS: 40.0000 mg | ORAL_TABLET | Freq: Every day | ORAL | 3 refills | Status: DC

## 2022-06-06 MED ORDER — CITALOPRAM HYDROBROMIDE 20 MG PO TABS: 20.0000 mg | ORAL_TABLET | Freq: Every day | ORAL | 3 refills | Status: DC

## 2022-06-06 MED ORDER — TRETINOIN 0.1 % EX CREA
1.0000 | TOPICAL_CREAM | Freq: Every day | CUTANEOUS | 2 refills | Status: DC
Start: 2022-06-06 — End: 2023-03-31

## 2022-06-06 MED ORDER — FAMOTIDINE 40 MG PO TABS
40.0000 mg | ORAL_TABLET | Freq: Two times a day (BID) | ORAL | 3 refills | Status: DC
Start: 2022-06-06 — End: 2023-03-31

## 2022-06-06 NOTE — Progress Notes (Signed)
Patient ID: Mark Dunlap, male   DOB: Apr 05, 1961, 61 y.o.   MRN: [id number]        Chief Complaint: follow up left ear pain, low iron, chronic lbp, smoker, elevated psa       HPI:  Mark Dunlap is a 61 y.o. male here overall doing ok, but has acute onset left ear pain with low grade fever but no frank d/c or hearing loss for 3 days.  Overall mild to mod, but has multiple other ongoing problems so hoping to get this resolved quickly  For MRI tomorrow, and hopefully lumbar surgury soon per pt.  Also need bilateral feet toe surgury eventually.  Still smoking but going to quit soon per pt.  PSA also jumped from under 2 to over 8, has f/u with urology soon.  Pt is s/p seed implant, hoping to avoid prostatectomy.  No known metastasis for now.  Has seen GI with iron def anemia, has forgotten to take his iron recently, but does plan f/u with GI  EGD is planned for sept 6, plans to hold plaivix x 5 days prior  No overt bleeding.  Pt denies chest pain, increased sob or doe, wheezing, orthopnea, PND, increased LE swelling, palpitations, dizziness or syncope.   Pt denies polydipsia, polyuria, or new focal neuro s/s.  Wt Readings from Last 3 Encounters:  06/06/22 176 lb (79.8 kg)  04/11/22 168 lb 8 oz (76.4 kg)  12/07/21 171 lb (77.6 kg)   BP Readings from Last 3 Encounters:  06/07/22 (!) 161/92  06/06/22 112/70  04/11/22 140/80         Past Medical History:  Diagnosis Date   Chronic back pain    upper to lower   Depression    GERD (gastroesophageal reflux disease)    Glaucoma, both eyes    H/O: substance abuse ([place])    03-21-2021   pt stated hx of ETOH/Crack cocaine-none since 12/2012 per pt/Does not Drive due to this   History of CVA (cerebrovascular accident) without residual deficits 10/26/2018   right BG/ CR infarct secondart to moderate SVD,  no residual   History of hyperthyroidism 2011   s/p RAI  06-20-2010,  followed by pcp   History of prostatitis 2010   HLD (hyperlipidemia)     Hypertension    followed by pcp    (nuclear stress test in epic 08-30-2013  normal no ischemia , ef 50%)   Legally blind in left eye, as defined in [place]    Blind Left eye, small amt vision Right eye   Lumbar radiculopathy    OA (osteoarthritis)    Prostate cancer [facility]) urologist--- dr Louis Meckel   dx 11-21-2020, Stage T1c, Gleason 4+3   Right thyroid nodule    Seasonal allergies    Spondylolisthesis, lumbar region    Wears dentures    upper full and lower partial   Wears glasses    Past Surgical History:  Procedure Laterality Date   ANTERIOR CERVICAL DECOMP/DISCECTOMY FUSION  10/11/2011   Procedure: ANTERIOR CERVICAL DECOMPRESSION/DISCECTOMY FUSION 3 LEVELS;  Surgeon: Eustace Moore;  Location: [place] [facility];  Service: Neurosurgery;  Laterality: N/A;  Cervical three-four ,cervical four five cervical five six Anterior Cervical Decompression Fusion with peek + plate Nuvasive translational plate Orthofix peek (2 1/2 hours) Rm # [id number]   ANTERIOR CERVICAL DECOMP/DISCECTOMY FUSION N/A 07/29/2014   Procedure: ANTERIOR CERVICAL DECOMPRESSION/DISCECTOMY FUSION 1 LEVEL/HARDWARE REMOVAL;  Surgeon: Eustace Moore, MD;  Location: [facility];  Service:  Neurosurgery;  Laterality: N/A;  cervical six-seven   COLONOSCOPY     COLONOSCOPY  lastone 07-12-2020   CYSTOSCOPY N/A 03/23/2021   Procedure: CYSTOSCOPY FLEXIBLE;  Surgeon: [name], MD;  Location: [facility];  Service: Urology;  Laterality: N/A;   HAND SURGERY Right x2  12/ 2007   I & D abscess   HARDWARE REMOVAL N/A 09/11/2020   Procedure: Removal of hardware LUmbar Four-Lumbar Five;  Surgeon: Eustace Moore, MD;  Location: [place];  Service: Neurosurgery;  Laterality: N/A;   LAMINECTOMY WITH POSTERIOR LATERAL ARTHRODESIS LEVEL 2 N/A 09/11/2020   Procedure: Laminectomy and Foraminotomy - Lumbar Three-Lumbar Four, posterior lateral fusion Lumbar Three-Four and Lumbar Four- Five;  Surgeon: Eustace Moore, MD;  Location: [place];  Service:  Neurosurgery;  Laterality: N/A;   POSTERIOR CERVICAL FUSION/FORAMINOTOMY N/A 09/23/2013   Procedure: CERVICALTWO TO CERVICAL SEVEN POSTERIOR CERVICAL FUSION/FORAMINOTOMY WITH LATERAL MASS FIXATION;  Surgeon: Eustace Moore, MD;  Location: [place] [facility];  Service: Neurosurgery;  Laterality: N/A;   POSTERIOR LUMBAR FUSION  11-27-2017  '@[facility]'$    L4--5   RADIOACTIVE SEED IMPLANT N/A 03/23/2021   Procedure: RADIOACTIVE SEED IMPLANT/BRACHYTHERAPY IMPLANT;  Surgeon: [name], MD;  Location: [facility];  Service: Urology;  Laterality: N/A;   SPACE OAR INSTILLATION N/A 03/23/2021   Procedure: SPACE OAR INSTILLATION;  Surgeon: [name], MD;  Location: [facility];  Service: Urology;  Laterality: N/A;   TOTAL HIP ARTHROPLASTY  12/17/2011   Procedure: TOTAL HIP ARTHROPLASTY;  Surgeon: Johnny Bridge, MD;  Location: [place];  Service: Orthopedics;  Laterality: Left;    reports that he has been smoking cigarettes. He has a 35.25 pack-year smoking history. He has never used smokeless tobacco. He reports that he does not currently use drugs after having used the following drugs: "Crack" cocaine. He reports that he does not drink alcohol. family history includes Alcohol abuse in his brother, cousin, and father; Arthritis in his mother; Cancer in his brother and sister; Diabetes in his brother and sister; Goiter in his mother and sister; Heart disease in his father and another family member; Hyperlipidemia in his mother; Hypertension in his sister; Stroke in his father. Allergies  Allergen Reactions   Tramadol Itching   Morphine And Related Itching   Oxycodone Itching   Current Outpatient Medications on File Prior to Visit  Medication Sig Dispense Refill   aspirin EC 81 MG tablet Take 81 mg by mouth daily. Swallow whole.     bimatoprost (LUMIGAN) 0.01 % SOLN Place 1 drop into both eyes at bedtime.     diazepam (VALIUM) 10 MG tablet Take 10 mg by mouth as needed.     diphenhydrAMINE  (BENADRYL) 25 mg capsule Take 50 mg by mouth every 6 (six) hours as needed for itching.      fluticasone (FLONASE) 50 MCG/ACT nasal spray USE 2 SPRAYS DAILY IN BOTH NOSTRILS 32 mL 0   gabapentin (NEURONTIN) 100 MG capsule Take 200 mg by mouth 2 (two) times daily.  2   iron polysaccharides (NU-IRON) 150 MG capsule Take 1 capsule (150 mg total) by mouth daily. 90 capsule 3   lidocaine (XYLOCAINE) 2 % solution Use as directed 15 mLs in the mouth or throat daily as needed (acid reflux). 100 mL 2   Multiple Vitamins-Minerals (ONE DAILY FOR MEN/LYCOPENE) TABS Take 1 tablet by mouth daily.     naloxone (NARCAN) nasal spray 4 mg/0.1 mL Place 1 spray into the nose  daily as needed.     nortriptyline (PAMELOR) 25 MG capsule Take 25 mg by mouth at bedtime.  0   oxyCODONE-acetaminophen (PERCOCET) 10-325 MG tablet Take 1 tablet by mouth every 6 (six) hours as needed.     sildenafil (VIAGRA) 100 MG tablet Take 100 mg by mouth daily.     timolol (TIMOPTIC) 0.5 % ophthalmic solution Place 1 drop into both eyes daily.  6   tiZANidine (ZANAFLEX) 4 MG tablet Take 4 mg by mouth every 8 (eight) hours as needed for muscle spasms.      No current facility-administered medications on file prior to visit.        ROS:  All others reviewed and negative.  Objective        PE:  BP 112/70   Pulse 79   Temp 97.9 F (36.6 C)   Ht 6' (1.829 m)   Wt 176 lb (79.8 kg)   SpO2 94%   BMI 23.87 kg/m                 Constitutional: Pt appears in NAD               HENT: Head: NCAT.                Right Ear: External ear normal.                 Left Ear: External ear with 1-2+ red, tender, swelling and trace d/c               Eyes: . Pupils are equal, round, and reactive to light. Conjunctivae and EOM are normal               Nose: without d/c or deformity               Neck: Neck supple. Gross normal ROM               Cardiovascular: Normal rate and regular rhythm.                 Pulmonary/Chest: Effort normal and breath  sounds without rales or wheezing.                Abd:  Soft, NT, ND, + BS, no organomegaly               Neurological: Pt is alert. At baseline orientation, motor grossly intact               Skin: Skin is warm. No rashes, no other new lesions, LE edema - none               Psychiatric: Pt behavior is normal without agitation   Micro: none  Cardiac tracings I have personally interpreted today:  none  Pertinent Radiological findings (summarize): none   Lab Results  Component Value Date   WBC 8.1 12/07/2021   HGB 11.2 (L) 12/07/2021   HCT 36.5 (L) 12/07/2021   PLT 449 (H) 12/07/2021   GLUCOSE 81 12/07/2021   CHOL 139 12/07/2021   TRIG 132 12/07/2021   HDL 50 12/07/2021   LDLDIRECT 100.0 09/02/2018   LDLCALC 67 12/07/2021   ALT 16 12/07/2021   AST 15 12/07/2021   NA 138 12/07/2021   K 4.1 12/07/2021   CL 100 12/07/2021   CREATININE 1.06 12/07/2021   BUN 12 12/07/2021   CO2 33 (H) 12/07/2021   TSH 1.75 12/07/2021   PSA 4.2 (H) 05/02/2020  INR 0.9 03/21/2021   HGBA1C 5.3 12/07/2021   Assessment/Plan:  Mark Dunlap is a 61 y.o. Black or African American [2] male with  has a past medical history of Chronic back pain, Depression, GERD (gastroesophageal reflux disease), Glaucoma, both eyes, H/O: substance abuse ([place]), History of CVA (cerebrovascular accident) without residual deficits (10/26/2018), History of hyperthyroidism (2011), History of prostatitis (2010), HLD (hyperlipidemia), Hypertension, Legally blind in left eye, as defined in [place], Lumbar radiculopathy, OA (osteoarthritis), Prostate cancer ([place]) (urologist--- dr Louis Meckel), Right thyroid nodule, Seasonal allergies, Spondylolisthesis, lumbar region, Wears dentures, and Wears glasses.  Vitamin D deficiency Last vitamin D Lab Results  Component Value Date   VD25OH 43 12/07/2021   Stable, cont oral replacement   External otitis of left ear Mild to mod, for antibx course,  to f/u any worsening symptoms or  concerns  Smoker Pt counsled to quit, pt not ready  Hyperglycemia Lab Results  Component Value Date   HGBA1C 5.3 12/07/2021   Stable, pt to continue current medical treatment  - diet, wt control, excercise   Hyperlipidemia Lab Results  Component Value Date   LDLCALC 67 12/07/2021   Stable, pt to continue current statin lipitor 49 mg qd   Elevated PSA Pt encouraged to continue f/u with urology as planned  Spondylolisthesis, lumbar region S/p lumbar fusion, now for MRI tomorrow,  to f/u any worsening symptoms or concerns with surgury  Iron deficiency anemia Pt to restart oral iron, declnes f/u lab today, and f/u GI as planned  History of completed stroke Pt to continue plavix  Followup: Return in about 6 months (around 12/07/2022).  Cathlean Cower, MD 06/09/2022 6:27 PM [facility]

## 2022-06-06 NOTE — Patient Instructions (Addendum)
Please take all new medication as prescribed - the antibiotic  Please continue all other medications as before, including the iron tablets, and the refills are done  Please have the pharmacy call with any other refills you may need.  Please continue your efforts at being more active, low cholesterol diet, and weight control.  Please keep your appointments with your specialists as you may have planned - GI on Sept 6 for the EGD  We can hold on further lab testing today  Please make an Appointment to return in 6 months, or sooner if needed

## 2022-06-07 ENCOUNTER — Ambulatory Visit
Admission: RE | Admit: 2022-06-07 | Discharge: 2022-06-07 | Disposition: A | Discharge status: Home / Self Care | Payer: Medicare Other | Source: Ambulatory Visit | Attending: Student | Admitting: Student

## 2022-06-07 DIAGNOSIS — M5416 Radiculopathy, lumbar region: Secondary | ICD-10-CM

## 2022-06-07 MED ORDER — IOPAMIDOL (ISOVUE-M 200) INJECTION 41%
20.0000 mL | Freq: Once | INTRAMUSCULAR | Status: AC
  Administered 2022-06-07: 20 mL via INTRATHECAL

## 2022-06-07 MED ORDER — DIAZEPAM 5 MG PO TABS
10.0000 mg | ORAL_TABLET | Freq: Once | ORAL | Status: AC
Start: 2022-06-07 — End: 2022-06-07
  Administered 2022-06-07: 10 mg via ORAL

## 2022-06-07 MED ORDER — ONDANSETRON HCL 4 MG/2ML IJ SOLN
4.0000 mg | Freq: Once | INTRAMUSCULAR | Status: AC | PRN
  Administered 2022-06-07: 4 mg via INTRAMUSCULAR

## 2022-06-07 MED ORDER — MEPERIDINE HCL 50 MG/ML IJ SOLN
50.0000 mg | Freq: Once | INTRAMUSCULAR | Status: AC | PRN
  Administered 2022-06-07: 50 mg via INTRAMUSCULAR

## 2022-06-07 NOTE — Discharge Instr - Other Orders (Addendum)
1000: pt reports pain 9/10 from myelogram procedure, see MAR. 1040: pt reports pain 5/10 after medications given.  Pt resting in nursing recovery area until d/c

## 2022-06-07 NOTE — Discharge Instructions (Signed)

## 2022-06-09 ENCOUNTER — Encounter: Payer: Self-pay | Admitting: Internal Medicine

## 2022-06-09 DIAGNOSIS — Z8673 Personal history of transient ischemic attack (TIA), and cerebral infarction without residual deficits: Secondary | ICD-10-CM | POA: Insufficient documentation

## 2022-06-09 DIAGNOSIS — D509 Iron deficiency anemia, unspecified: Secondary | ICD-10-CM | POA: Insufficient documentation

## 2022-06-09 DIAGNOSIS — H6092 Unspecified otitis externa, left ear: Secondary | ICD-10-CM | POA: Insufficient documentation

## 2022-06-09 NOTE — Assessment & Plan Note (Signed)
Mild to mod, for antibx course,  to f/u any worsening symptoms or concerns 

## 2022-06-09 NOTE — Assessment & Plan Note (Signed)
Lab Results  Component Value Date   LDLCALC 67 12/07/2021   Stable, pt to continue current statin lipitor 49 mg qd

## 2022-06-09 NOTE — Assessment & Plan Note (Signed)
Last vitamin D Lab Results  Component Value Date   VD25OH 43 12/07/2021   Stable, cont oral replacement

## 2022-06-09 NOTE — Assessment & Plan Note (Signed)
Lab Results  Component Value Date   HGBA1C 5.3 12/07/2021   Stable, pt to continue current medical treatment  - diet, wt control, excercise

## 2022-06-09 NOTE — Assessment & Plan Note (Signed)
Pt to restart oral iron, declnes f/u lab today, and f/u GI as planned

## 2022-06-09 NOTE — Assessment & Plan Note (Signed)
Pt to continue plavix

## 2022-06-09 NOTE — Assessment & Plan Note (Signed)
Pt encouraged to continue f/u with urology as planned

## 2022-06-09 NOTE — Assessment & Plan Note (Signed)
Pt counsled to quit, pt not ready °

## 2022-06-09 NOTE — Assessment & Plan Note (Signed)
S/p lumbar fusion, now for MRI tomorrow,  to f/u any worsening symptoms or concerns with surgury

## 2022-06-12 ENCOUNTER — Ambulatory Visit: Payer: Medicare Other | Admitting: Podiatry

## 2022-06-12 DIAGNOSIS — M19072 Primary osteoarthritis, left ankle and foot: Secondary | ICD-10-CM

## 2022-06-12 DIAGNOSIS — M19071 Primary osteoarthritis, right ankle and foot: Secondary | ICD-10-CM

## 2022-06-12 NOTE — Progress Notes (Signed)
Subjective:  Patient ID: Mark Dunlap, male    DOB: 29-Apr-1961,  MRN: [id number]  Chief Complaint  Patient presents with   Arthritis    61 y.o. male presents with the above complaint.  Patient is here for follow-up of bilateral first metatarsophalangeal joint pain with severe arthrosis.  He is here for his injection every few months.  His pain started acting back up again.   Review of Systems: Negative except as noted in the HPI. Denies N/V/F/Ch.  Past Medical History:  Diagnosis Date   Chronic back pain    upper to lower   Depression    GERD (gastroesophageal reflux disease)    Glaucoma, both eyes    H/O: substance abuse ([place])    03-21-2021   pt stated hx of ETOH/Crack cocaine-none since 12/2012 per pt/Does not Drive due to this   History of CVA (cerebrovascular accident) without residual deficits 10/26/2018   right BG/ CR infarct secondart to moderate SVD,  no residual   History of hyperthyroidism 2011   s/p RAI  06-20-2010,  followed by pcp   History of prostatitis 2010   HLD (hyperlipidemia)    Hypertension    followed by pcp    (nuclear stress test in epic 08-30-2013  normal no ischemia , ef 50%)   Legally blind in left eye, as defined in [place]    Blind Left eye, small amt vision Right eye   Lumbar radiculopathy    OA (osteoarthritis)    Prostate cancer [facility]) urologist--- dr Louis Meckel   dx 11-21-2020, Stage T1c, Gleason 4+3   Right thyroid nodule    Seasonal allergies    Spondylolisthesis, lumbar region    Wears dentures    upper full and lower partial   Wears glasses     Current Outpatient Medications:    adapalene (DIFFERIN) 0.1 % gel, Apply 1 Application topically at bedtime., Disp: 45 g, Rfl: 1   aspirin EC 81 MG tablet, Take 81 mg by mouth daily. Swallow whole., Disp: , Rfl:    atorvastatin (LIPITOR) 40 MG tablet, Take 1 tablet (40 mg total) by mouth daily., Disp: 90 tablet, Rfl: 3   bimatoprost (LUMIGAN) 0.01 % SOLN, Place 1 drop into both eyes at bedtime.,  Disp: , Rfl:    citalopram (CELEXA) 20 MG tablet, Take 1 tablet (20 mg total) by mouth daily., Disp: 90 tablet, Rfl: 3   clopidogrel (PLAVIX) 75 MG tablet, Take 1 tablet (75 mg total) by mouth daily., Disp: 90 tablet, Rfl: 3   diazepam (VALIUM) 10 MG tablet, Take 10 mg by mouth as needed., Disp: , Rfl:    diphenhydrAMINE (BENADRYL) 25 mg capsule, Take 50 mg by mouth every 6 (six) hours as needed for itching. , Disp: , Rfl:    famotidine (PEPCID) 40 MG tablet, Take 1 tablet (40 mg total) by mouth 2 (two) times daily. Take every morning and at bedtime, Disp: 180 tablet, Rfl: 3   fluticasone (FLONASE) 50 MCG/ACT nasal spray, USE 2 SPRAYS DAILY IN BOTH NOSTRILS, Disp: 32 mL, Rfl: 0   gabapentin (NEURONTIN) 100 MG capsule, Take 200 mg by mouth 2 (two) times daily., Disp: , Rfl: 2   hydrochlorothiazide (MICROZIDE) 12.5 MG capsule, TAKE 1 CAPSULE BY MOUTH EVERY DAY, Disp: 90 capsule, Rfl: 3   iron polysaccharides (NU-IRON) 150 MG capsule, Take 1 capsule (150 mg total) by mouth daily., Disp: 90 capsule, Rfl: 3   lidocaine (XYLOCAINE) 2 % solution, Use as directed 15 mLs in the mouth  or throat daily as needed (acid reflux)., Disp: 100 mL, Rfl: 2   Multiple Vitamins-Minerals (ONE DAILY FOR MEN/LYCOPENE) TABS, Take 1 tablet by mouth daily., Disp: , Rfl:    naloxone (NARCAN) nasal spray 4 mg/0.1 mL, Place 1 spray into the nose daily as needed., Disp: , Rfl:    nortriptyline (PAMELOR) 25 MG capsule, Take 25 mg by mouth at bedtime., Disp: , Rfl: 0   oxybutynin (DITROPAN) 5 MG tablet, Take 1 tablet (5 mg total) by mouth at bedtime., Disp: 90 tablet, Rfl: 3   oxyCODONE-acetaminophen (PERCOCET) 10-325 MG tablet, Take 1 tablet by mouth every 6 (six) hours as needed., Disp: , Rfl:    pantoprazole (PROTONIX) 40 MG tablet, Take 1 tablet (40 mg total) by mouth daily., Disp: 90 tablet, Rfl: 3   sildenafil (VIAGRA) 100 MG tablet, Take 100 mg by mouth daily., Disp: , Rfl:    tamsulosin (FLOMAX) 0.4 MG CAPS capsule, Take 1  capsule (0.4 mg total) by mouth 2 (two) times daily., Disp: 180 capsule, Rfl: 3   timolol (TIMOPTIC) 0.5 % ophthalmic solution, Place 1 drop into both eyes daily., Disp: , Rfl: 6   tiZANidine (ZANAFLEX) 4 MG tablet, Take 4 mg by mouth every 8 (eight) hours as needed for muscle spasms. , Disp: , Rfl:    tretinoin (RETIN-A) 0.1 % cream, Apply 1 application  topically at bedtime., Disp: 45 g, Rfl: 2   zolpidem (AMBIEN) 10 MG tablet, Take 1 tablet (10 mg total) by mouth at bedtime as needed. for sleep, Disp: 90 tablet, Rfl: 1  Social History   Tobacco Use  Smoking Status Every Day   Packs/day: 0.75   Years: 47.00   Total pack years: 35.25   Types: Cigarettes  Smokeless Tobacco Never  Tobacco Comments   since age 66    Allergies  Allergen Reactions   Tramadol Itching   Morphine And Related Itching   Oxycodone Itching   Objective:   There were no vitals filed for this visit. There is no height or weight on file to calculate BMI. Constitutional Well developed. Well nourished.  Vascular Dorsalis pedis pulses palpable bilaterally. Posterior tibial pulses palpable bilaterally. Capillary refill normal to all digits.  No cyanosis or clubbing noted. Pedal hair growth normal.  Neurologic Normal speech. Oriented to person, place, and time. Epicritic sensation to light touch grossly present bilaterally.  Dermatologic Nails well groomed and normal in appearance. No open wounds. No skin lesions.  Orthopedic:  Limited range of motion noted of bilateral first metatarsophalangeal joint with left more painful than right.  Intra-articular pain noted bilaterally.  Pain with end range of motion.  Hallux limitus noted bilaterally.   Radiographs: 2 views of bilaterally skeletally mature adult foot: Severe arthritic changes noted to the right side.  Uneven joint space narrowing noted to the left side.  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  No other bony abnormalities identified mild arthritic changes noted to the dorsal aspect of the midfoot.  Mild elevatus present bilaterally. Assessment:   No diagnosis found.     Plan:  Patient was evaluated and treated and all questions answered.  Bilateral first metatarsophalangeal joint arthritis with left more painful than right -Given clinically patient is improving and the pain is decreasing with steroid injection I believe he will benefit from another steroid injection to bilateral first metatarsophalangeal joint to help him get to closer to 95% or greater in terms of  pain relief.  Patient states understanding would like to proceed with a injection to bilateral first metatarsophalangeal joint.  However at this time I am worried that his pain is not getting resolved and he is needing more injection.  I briefly discussed with him that he will need surgical intervention if this injection does not help.  For now patient states injections do help and once he gets his social situation under control he will proceed with surgery.  Patient states understanding -A steroid injection was performed at bilateral first metatarsophalangeal joint using 1% plain Lidocaine and 10 mg of Kenalog. This was well tolerated. -I will also discussed with him orthotics management with Morton's extension take some of the stress off of the first metatarsophalangeal joint. -I will continue doing bilateral steroid injection as long as it is controlling his pain.  I will continue to do steroid injection until patient is ready for surgery about every 12 weeks No follow-ups on file.

## 2022-06-16 ENCOUNTER — Other Ambulatory Visit: Payer: Self-pay | Admitting: Internal Medicine

## 2022-06-18 ENCOUNTER — Telehealth: Payer: Self-pay | Admitting: Internal Medicine

## 2022-06-18 NOTE — Telephone Encounter (Signed)
Pt is requesting a refill of his hydrochlorothiazide (MICROZIDE) 12.5 MG capsule RX. He is completely out of his medication. Last visit 8.24.23  [id number] Phone:  [phone] Fax:  [phone]

## 2022-06-19 ENCOUNTER — Ambulatory Visit (AMBULATORY_SURGERY_CENTER): Payer: Medicare Other | Admitting: Gastroenterology

## 2022-06-19 ENCOUNTER — Encounter: Payer: Self-pay | Admitting: Gastroenterology

## 2022-06-19 ENCOUNTER — Telehealth: Payer: Self-pay

## 2022-06-19 VITALS — BP 112/70 | HR 69 | Temp 97.3°F | Resp 18 | Ht 71.0 in | Wt 168.0 lb

## 2022-06-19 DIAGNOSIS — K219 Gastro-esophageal reflux disease without esophagitis: Secondary | ICD-10-CM | POA: Diagnosis not present

## 2022-06-19 DIAGNOSIS — D509 Iron deficiency anemia, unspecified: Secondary | ICD-10-CM | POA: Diagnosis not present

## 2022-06-19 MED ORDER — DOXYCYCLINE HYCLATE 100 MG PO TABS: 100.0000 mg | ORAL_TABLET | Freq: Two times a day (BID) | ORAL | 0 refills | Status: DC

## 2022-06-19 MED ORDER — SODIUM CHLORIDE 0.9 % IV SOLN: 500.0000 mL | Freq: Once | INTRAVENOUS | Status: DC

## 2022-06-19 NOTE — Telephone Encounter (Signed)
Pt states that he feels like he has a bladder infection. Pt states that he is urinating frequently. Pt states that Dr. Jenny Reichmann knows that he has prostate issues already.  Pt is requesting a Abx and declines appt due to him just being seen on 06/06/22.   Please advise

## 2022-06-19 NOTE — Progress Notes (Signed)
Called to room to assist during endoscopic procedure.  Patient ID and intended procedure confirmed with present staff. Received instructions for my participation in the procedure from the performing physician.   Normal EGD  Capsule pill placed in duodenum    Capsule ID# [id number]  Lot # [id number]  Exp date: 09/20/23

## 2022-06-19 NOTE — Op Note (Signed)
[place] Patient Name: Mark Dunlap Procedure Date: 06/19/2022 10:25 AM MRN: [id number] Endoscopist: Ladene Artist , MD Age: 61 Referring MD:  Date of Birth: 08/25/1961 Gender: Male Account #: [id number] Procedure:                Upper GI endoscopy Indications:              Unexplained iron deficiency anemia,                            Gastroesophageal reflux disease, Abnormal video                            capsule endoscopy (query blood in stomach) Medicines:                Monitored Anesthesia Care Procedure:                Pre-Anesthesia Assessment:                           - Prior to the procedure, a History and Physical                            was performed, and patient medications and                            allergies were reviewed. The patient's tolerance of                            previous anesthesia was also reviewed. The risks                            and benefits of the procedure and the sedation                            options and risks were discussed with the patient.                            All questions were answered, and informed consent                            was obtained. Prior Anticoagulants: The patient has                            taken Plavix (clopidogrel), last dose was 5 days                            prior to procedure. ASA Grade Assessment: III - A                            patient with severe systemic disease. After                            reviewing the risks and benefits, the patient was  deemed in satisfactory condition to undergo the                            procedure.                           After obtaining informed consent, the endoscope was                            passed under direct vision. Throughout the                            procedure, the patient's blood pressure, pulse, and                            oxygen saturations were monitored continuously. The                             Endoscope was introduced through the mouth, and                            advanced to the third part of duodenum. The upper                            GI endoscopy was accomplished without difficulty.                            The patient tolerated the procedure well. Scope In: Scope Out: Findings:                 The examined esophagus was normal.                           The entire examined stomach was normal.                           The duodenal bulb, second portion of the duodenum                            and third portion of the duodenum were normal.                            Using the endoscope, the video capsule enteroscope                            was advanced into the second portion of the                            duodenum. Complications:            No immediate complications. Estimated Blood Loss:     Estimated blood loss: none. Impression:               - Normal esophagus.                           -  Normal stomach.                           - Normal duodenal bulb, second portion of the                            duodenum and third portion of the duodenum.                           - Successful completion of the Video Capsule                            Enteroscope placement.                           - No specimens collected. Recommendation:           - Patient has a contact number available for                            emergencies. The signs and symptoms of potential                            delayed complications were discussed with the                            patient. Return to normal activities tomorrow.                            Written discharge instructions were provided to the                            patient.                           - Resume previous diet per VCE protocol.                           - Continue present medications.                           - Resume Plavix (clopidogrel) at prior dose                             tomorrow. Refer to managing physician for further                            adjustment of therapy. Ladene Artist, MD 06/19/2022 10:51:28 AM This report has been signed electronically.

## 2022-06-19 NOTE — Patient Instructions (Signed)
YOU HAD AN ENDOSCOPIC PROCEDURE TODAY AT THE [facility]:   Refer to the procedure report that was given to you for any specific questions about what was found during the examination.  If the procedure report does not answer your questions, please call your gastroenterologist to clarify.  If you requested that your care partner not be given the details of your procedure findings, then the procedure report has been included in a sealed envelope for you to review at your convenience later.  YOU SHOULD EXPECT: Some feelings of bloating in the abdomen. Passage of more gas than usual.  Walking can help get rid of the air that was put into your GI tract during the procedure and reduce the bloating. If you had a lower endoscopy (such as a colonoscopy or flexible sigmoidoscopy) you may notice spotting of blood in your stool or on the toilet paper. If you underwent a bowel prep for your procedure, you may not have a normal bowel movement for a few days.  Please Note:  You might notice some irritation and congestion in your nose or some drainage.  This is from the oxygen used during your procedure.  There is no need for concern and it should clear up in a day or so.  SYMPTOMS TO REPORT IMMEDIATELY:  Following upper endoscopy (EGD)  Vomiting of blood or coffee ground material  New chest pain or pain under the shoulder blades  Painful or persistently difficult swallowing  New shortness of breath  Fever of 100F or higher  Black, tarry-looking stools  For urgent or emergent issues, a gastroenterologist can be reached at any hour by calling [phone]. Do not use MyChart messaging for urgent concerns.    DIET:  We do recommend a small meal at first, but then you may proceed to your regular diet.  Drink plenty of fluids but you should avoid alcoholic beverages for 24 hours.  ACTIVITY:  You should plan to take it easy for the rest of today and you should NOT DRIVE or use heavy machinery until  tomorrow (because of the sedation medicines used during the test).    FOLLOW UP: Our staff will call the number listed on your records the next business day following your procedure.  We will call around 7:15- 8:00 am to check on you and address any questions or concerns that you may have regarding the information given to you following your procedure. If we do not reach you, we will leave a message.  If you develop any symptoms (ie: fever, flu-like symptoms, shortness of breath, cough etc.) before then, please call [phone].  If you test positive for Covid 19 in the 2 weeks post procedure, please call and report this information to us.    If any biopsies were taken you will be contacted by phone or by letter within the next 1-3 weeks.  Please call us at [phone] if you have not heard about the biopsies in 3 weeks.    SIGNATURES/CONFIDENTIALITY: You and/or your care partner have signed paperwork which will be entered into your electronic medical record.  These signatures attest to the fact that that the information above on your After Visit Summary has been reviewed and is understood.  Full responsibility of the confidentiality of this discharge information lies with you and/or your care-partner.  

## 2022-06-19 NOTE — Progress Notes (Signed)
History & Physical  Primary Care Physician:  Biagio Borg, MD Primary Gastroenterologist: Lucio Edward, MD  CHIEF COMPLAINT:  Iron deficiency anemia, GERD, question gastric bleeding on incomplete VCE  HPI: Mark Dunlap is a 61 y.o. male with iron deficiency anemia, GERD, question gastric bleeding on incomplete VCE for EGD and VCE placement.   Past Medical History:  Diagnosis Date   Chronic back pain    upper to lower   Depression    GERD (gastroesophageal reflux disease)    Glaucoma, both eyes    H/O: substance abuse ([place])    03-21-2021   pt stated hx of ETOH/Crack cocaine-none since 12/2012 per pt/Does not Drive due to this   History of CVA (cerebrovascular accident) without residual deficits 10/26/2018   right BG/ CR infarct secondart to moderate SVD,  no residual   History of hyperthyroidism 2011   s/p RAI  06-20-2010,  followed by pcp   History of prostatitis 2010   HLD (hyperlipidemia)    Hypertension    followed by pcp    (nuclear stress test in epic 08-30-2013  normal no ischemia , ef 50%)   Legally blind in left eye, as defined in [place]    Blind Left eye, small amt vision Right eye   Lumbar radiculopathy    OA (osteoarthritis)    Prostate cancer [facility]) urologist--- dr Louis Meckel   dx 11-21-2020, Stage T1c, Gleason 4+3   Right thyroid nodule    Seasonal allergies    Spondylolisthesis, lumbar region    Wears dentures    upper full and lower partial   Wears glasses     Past Surgical History:  Procedure Laterality Date   ANTERIOR CERVICAL DECOMP/DISCECTOMY FUSION  10/11/2011   Procedure: ANTERIOR CERVICAL DECOMPRESSION/DISCECTOMY FUSION 3 LEVELS;  Surgeon: Eustace Moore;  Location: [facility];  Service: Neurosurgery;  Laterality: N/A;  Cervical three-four ,cervical four five cervical five six Anterior Cervical Decompression Fusion with peek + plate Nuvasive translational plate Orthofix peek (2 1/2 hours) Rm # [id number]   ANTERIOR CERVICAL DECOMP/DISCECTOMY FUSION  N/A 07/29/2014   Procedure: ANTERIOR CERVICAL DECOMPRESSION/DISCECTOMY FUSION 1 LEVEL/HARDWARE REMOVAL;  Surgeon: Eustace Moore, MD;  Location: [place] [facility];  Service: Neurosurgery;  Laterality: N/A;  cervical six-seven   COLONOSCOPY     COLONOSCOPY  lastone 07-12-2020   CYSTOSCOPY N/A 03/23/2021   Procedure: CYSTOSCOPY FLEXIBLE;  Surgeon: [name], MD;  Location: [facility];  Service: Urology;  Laterality: N/A;   HAND SURGERY Right x2  12/ 2007   I & D abscess   HARDWARE REMOVAL N/A 09/11/2020   Procedure: Removal of hardware LUmbar Four-Lumbar Five;  Surgeon: Eustace Moore, MD;  Location: [place];  Service: Neurosurgery;  Laterality: N/A;   LAMINECTOMY WITH POSTERIOR LATERAL ARTHRODESIS LEVEL 2 N/A 09/11/2020   Procedure: Laminectomy and Foraminotomy - Lumbar Three-Lumbar Four, posterior lateral fusion Lumbar Three-Four and Lumbar Four- Five;  Surgeon: Eustace Moore, MD;  Location: [place];  Service: Neurosurgery;  Laterality: N/A;   POSTERIOR CERVICAL FUSION/FORAMINOTOMY N/A 09/23/2013   Procedure: CERVICALTWO TO CERVICAL SEVEN POSTERIOR CERVICAL FUSION/FORAMINOTOMY WITH LATERAL MASS FIXATION;  Surgeon: Eustace Moore, MD;  Location: [place] [facility];  Service: Neurosurgery;  Laterality: N/A;   POSTERIOR LUMBAR FUSION  11-27-2017  '@[facility]'$    L4--5   RADIOACTIVE SEED IMPLANT N/A 03/23/2021   Procedure: RADIOACTIVE SEED IMPLANT/BRACHYTHERAPY IMPLANT;  Surgeon: [name], MD;  Location: [facility];  Service: Urology;  Laterality: N/A;  SPACE OAR INSTILLATION N/A 03/23/2021   Procedure: SPACE OAR INSTILLATION;  Surgeon: [name], MD;  Location: [facility];  Service: Urology;  Laterality: N/A;   TOTAL HIP ARTHROPLASTY  12/17/2011   Procedure: TOTAL HIP ARTHROPLASTY;  Surgeon: Johnny Bridge, MD;  Location: [place];  Service: Orthopedics;  Laterality: Left;    Prior to Admission medications   Medication Sig Start Date End Date Taking? Authorizing Provider   atorvastatin (LIPITOR) 40 MG tablet Take 1 tablet (40 mg total) by mouth daily. 06/06/22  Yes Biagio Borg, MD  bimatoprost (LUMIGAN) 0.01 % SOLN Place 1 drop into both eyes at bedtime.   Yes [provider]  oxybutynin (DITROPAN) 5 MG tablet Take 1 tablet (5 mg total) by mouth at bedtime. 06/06/22  Yes Biagio Borg, MD  oxyCODONE-acetaminophen (PERCOCET) 10-325 MG tablet Take 1 tablet by mouth every 6 (six) hours as needed. 03/18/22  Yes [provider]  tamsulosin (FLOMAX) 0.4 MG CAPS capsule Take 1 capsule (0.4 mg total) by mouth 2 (two) times daily. 06/06/22  Yes Biagio Borg, MD  timolol (TIMOPTIC) 0.5 % ophthalmic solution Place 1 drop into both eyes daily. 10/28/17  Yes [provider]  adapalene (DIFFERIN) 0.1 % gel Apply 1 Application topically at bedtime. 06/06/22   Biagio Borg, MD  aspirin EC 81 MG tablet Take 81 mg by mouth daily. Swallow whole.    [provider]  azithromycin (ZITHROMAX) 250 MG tablet Take by mouth. 06/11/22   [provider]  citalopram (CELEXA) 20 MG tablet Take 1 tablet (20 mg total) by mouth daily. 06/06/22   Biagio Borg, MD  clopidogrel (PLAVIX) 75 MG tablet Take 1 tablet (75 mg total) by mouth daily. 06/06/22   Biagio Borg, MD  diazepam (VALIUM) 10 MG tablet Take 10 mg by mouth as needed. Patient not taking: Reported on 06/19/2022 10/06/20   [provider]  diphenhydrAMINE (BENADRYL) 25 mg capsule Take 50 mg by mouth every 6 (six) hours as needed for itching.     [provider]  famotidine (PEPCID) 40 MG tablet Take 1 tablet (40 mg total) by mouth 2 (two) times daily. Take every morning and at bedtime 06/06/22   Biagio Borg, MD  fluticasone [facility]) 50 MCG/ACT nasal spray USE 2 SPRAYS DAILY IN BOTH NOSTRILS 12/11/21   Biagio Borg, MD  gabapentin (NEURONTIN) 100 MG capsule Take 200 mg by mouth 2 (two) times daily. 04/13/18   [provider]  hydrochlorothiazide (MICROZIDE) 12.5 MG capsule  TAKE 1 CAPSULE BY MOUTH EVERY DAY 06/06/22   Biagio Borg, MD  iron polysaccharides (NU-IRON) 150 MG capsule Take 1 capsule (150 mg total) by mouth daily. 03/04/22   Biagio Borg, MD  lidocaine (XYLOCAINE) 2 % solution Use as directed 15 mLs in the mouth or throat daily as needed (acid reflux). Patient not taking: Reported on 06/19/2022 03/28/21   Biagio Borg, MD  Multiple Vitamins-Minerals (ONE DAILY FOR MEN/LYCOPENE) TABS Take 1 tablet by mouth daily.    [provider]  naloxone [facility]) nasal spray 4 mg/0.1 mL Place 1 spray into the nose daily as needed. Patient not taking: Reported on 06/19/2022 12/17/21   [provider]  nortriptyline (PAMELOR) 25 MG capsule Take 25 mg by mouth at bedtime. 10/29/17   [provider]  pantoprazole (PROTONIX) 40 MG tablet Take 1 tablet (40 mg total) by mouth daily. 06/06/22   Biagio Borg, MD  sildenafil (VIAGRA) 100 MG tablet Take 100 mg by mouth daily. 11/28/20   [provider]  tiZANidine (ZANAFLEX) 4 MG tablet Take 4 mg by mouth every 8 (eight) hours as needed for muscle spasms.  06/23/19   [provider]  tretinoin (RETIN-A) 0.1 % cream Apply 1 application  topically at bedtime. 06/06/22   Biagio Borg, MD  zolpidem (AMBIEN) 10 MG tablet Take 1 tablet (10 mg total) by mouth at bedtime as needed. for sleep 06/06/22   Biagio Borg, MD    Current Outpatient Medications  Medication Sig Dispense Refill   atorvastatin (LIPITOR) 40 MG tablet Take 1 tablet (40 mg total) by mouth daily. 90 tablet 3   bimatoprost (LUMIGAN) 0.01 % SOLN Place 1 drop into both eyes at bedtime.     oxybutynin (DITROPAN) 5 MG tablet Take 1 tablet (5 mg total) by mouth at bedtime. 90 tablet 3   oxyCODONE-acetaminophen (PERCOCET) 10-325 MG tablet Take 1 tablet by mouth every 6 (six) hours as needed.     tamsulosin (FLOMAX) 0.4 MG CAPS capsule Take 1 capsule (0.4 mg total) by mouth 2 (two) times daily. 180 capsule 3   timolol (TIMOPTIC) 0.5 %  ophthalmic solution Place 1 drop into both eyes daily.  6   adapalene (DIFFERIN) 0.1 % gel Apply 1 Application topically at bedtime. 45 g 1   aspirin EC 81 MG tablet Take 81 mg by mouth daily. Swallow whole.     azithromycin (ZITHROMAX) 250 MG tablet Take by mouth.     citalopram (CELEXA) 20 MG tablet Take 1 tablet (20 mg total) by mouth daily. 90 tablet 3   clopidogrel (PLAVIX) 75 MG tablet Take 1 tablet (75 mg total) by mouth daily. 90 tablet 3   diazepam (VALIUM) 10 MG tablet Take 10 mg by mouth as needed. (Patient not taking: Reported on 06/19/2022)     diphenhydrAMINE (BENADRYL) 25 mg capsule Take 50 mg by mouth every 6 (six) hours as needed for itching.      famotidine (PEPCID) 40 MG tablet Take 1 tablet (40 mg total) by mouth 2 (two) times daily. Take every morning and at bedtime 180 tablet 3   fluticasone (FLONASE) 50 MCG/ACT nasal spray USE 2 SPRAYS DAILY IN BOTH NOSTRILS 32 mL 0   gabapentin (NEURONTIN) 100 MG capsule Take 200 mg by mouth 2 (two) times daily.  2   hydrochlorothiazide (MICROZIDE) 12.5 MG capsule TAKE 1 CAPSULE BY MOUTH EVERY DAY 90 capsule 3   iron polysaccharides (NU-IRON) 150 MG capsule Take 1 capsule (150 mg total) by mouth daily. 90 capsule 3   lidocaine (XYLOCAINE) 2 % solution Use as directed 15 mLs in the mouth or throat daily as needed (acid reflux). (Patient not taking: Reported on 06/19/2022) 100 mL 2   Multiple Vitamins-Minerals (ONE DAILY FOR MEN/LYCOPENE) TABS Take 1 tablet by mouth daily.     naloxone (NARCAN) nasal spray 4 mg/0.1 mL Place 1 spray into the nose daily as needed. (Patient not taking: Reported on 06/19/2022)     nortriptyline (PAMELOR) 25 MG capsule Take 25 mg by mouth at bedtime.  0   pantoprazole (PROTONIX) 40 MG tablet Take 1 tablet (40 mg total) by mouth daily. 90 tablet 3   sildenafil (VIAGRA) 100 MG tablet Take 100 mg by mouth daily.     tiZANidine (ZANAFLEX) 4 MG tablet Take 4 mg by mouth every 8 (eight) hours as needed for muscle spasms.       tretinoin (  RETIN-A) 0.1 % cream Apply 1 application  topically at bedtime. 45 g 2   zolpidem (AMBIEN) 10 MG tablet Take 1 tablet (10 mg total) by mouth at bedtime as needed. for sleep 90 tablet 1   Current Facility-Administered Medications  Medication Dose Route Frequency Provider Last Rate Last Admin   0.9 %  sodium chloride infusion  500 mL Intravenous Once Ladene Artist, MD        Allergies as of 06/19/2022 - Review Complete 06/19/2022  Allergen Reaction Noted   Tramadol Itching 10/03/2011   Morphine and related Itching 12/06/2011   Oxycodone Itching 11/20/2017    Family History  Problem Relation Age of Onset   Alcohol abuse Father    Stroke Father    Heart disease Father    Arthritis Mother    Hyperlipidemia Mother    Goiter Mother        resection of benign goiter   Hypertension Sister    Diabetes Sister    Goiter Sister        resection of benign goiter   Cancer Sister        oldest.type unknown.   Diabetes Brother    Cancer Brother        oldest.type unknown.   Alcohol abuse Brother    Alcohol abuse Cousin    Heart disease Other        Aunt   Colon cancer Neg Hx    Colon polyps Neg Hx    Esophageal cancer Neg Hx    Rectal cancer Neg Hx    Stomach cancer Neg Hx    Breast cancer Neg Hx    Prostate cancer Neg Hx    Pancreatic cancer Neg Hx     Social History   Socioeconomic History   Marital status: Single    Spouse name: Not on file   Number of children: 0   Years of education: Not on file   Highest education level: Not on file  Occupational History   Occupation: [employer]  Tobacco Use   Smoking status: Every Day    Packs/day: 0.75    Years: 47.00    Total pack years: 35.25    Types: Cigarettes   Smokeless tobacco: Never   Tobacco comments:    since age 45  Vaping Use   Vaping Use: Never used  Substance and Sexual Activity   Alcohol use: No    Alcohol/week: 0.0 standard drinks of alcohol    Comment: 03-21-2021 pt stated  hx of alcohol abuse quit 2014   Drug use: Not Currently    Types: "Crack" cocaine    Comment: 03-21-2021 pt stated last use 12/2012   Sexual activity: Not Currently  Other Topics Concern   Not on file  Social History Narrative   Not on file   Social Determinants of Health   Financial Resource Strain: Low Risk  (05/16/2021)   Overall Financial Resource Strain (CARDIA)    Difficulty of Paying Living Expenses: Not hard at all  Food Insecurity: No Food Insecurity (05/16/2021)   Hunger Vital Sign    Worried About Running Out of Food in the Last Year: Never true    Ran Out of Food in the Last Year: Never true  Transportation Needs: No Transportation Needs (05/16/2021)   PRAPARE - Hydrologist (Medical): No    Lack of Transportation (Non-Medical): No  Physical Activity: Inactive (02/05/2018)   Exercise Vital Sign    Days  of Exercise per Week: 0 days    Minutes of Exercise per Session: 0 min  Stress: No Stress Concern Present (05/16/2021)   [place]    Feeling of Stress : Not at all  Social Connections: Moderately Integrated (05/16/2021)   Social Connection and Isolation Panel [NHANES]    Frequency of Communication with Friends and Family: More than three times a week    Frequency of Social Gatherings with Friends and Family: Once a week    Attends Religious Services: 1 to 4 times per year    Active Member of [employer] or Organizations: Patient refused    Attends Archivist Meetings: 1 to 4 times per year    Marital Status: Never married  Intimate Partner Violence: Not At Risk (05/16/2021)   Humiliation, Afraid, Rape, and Kick questionnaire    Fear of Current or Ex-Partner: No    Emotionally Abused: No    Physically Abused: No    Sexually Abused: No    Review of Systems:  All systems reviewed were negative except where noted in HPI.   Physical Exam: General:  Alert, well-developed, in  NAD Head:  Normocephalic and atraumatic. Eyes:  Sclera clear, no icterus.   Conjunctiva pink. Ears:  Normal auditory acuity. Mouth:  No deformity or lesions.  Neck:  Supple; no masses . Lungs:  Clear throughout to auscultation.   No wheezes, crackles, or rhonchi. No acute distress. Heart:  Regular rate and rhythm; no murmurs. Abdomen:  Soft, nondistended, nontender. No masses, hepatomegaly. No obvious masses.  Normal bowel .    Rectal:  Deferred   Msk:  Symmetrical without gross deformities.. Pulses:  Normal pulses noted. Extremities:  Without edema. Neurologic:  Alert and  oriented x4;  grossly normal neurologically. Skin:  Intact without significant lesions or rashes. Cervical Nodes:  No significant cervical adenopathy. Psych:  Alert and cooperative. Normal mood and affect.   Impression / Plan:   Iron deficiency anemia, GERD, question gastric bleeding on incomplete VCE for EGD and VCE placement.  Pricilla Riffle. Fuller Plan  06/19/2022, 10:28 AM See Shea Evans, [facility], to contact our on call provider

## 2022-06-19 NOTE — Progress Notes (Signed)
VS by DT  Pt's states no medical or surgical changes since previsit or office visit.  

## 2022-06-19 NOTE — Progress Notes (Signed)
A and O x3. Report to RN. Tolerated MAC anesthesia well.Teeth unchanged after procedure. 

## 2022-06-19 NOTE — Telephone Encounter (Signed)
Ok antibiotic sent to [facility]

## 2022-06-20 ENCOUNTER — Telehealth: Payer: Self-pay

## 2022-06-20 NOTE — Telephone Encounter (Signed)
Post procedure follow up call, no answer 

## 2022-06-20 NOTE — Telephone Encounter (Signed)
  Follow up Call-     06/19/2022    9:18 AM 06/07/2022    9:02 AM 07/12/2020    9:05 AM  Call back number  Post procedure Call Back phone  # [phone]  Permission to leave phone message Yes  Yes     Patient questions:  Do you have a fever, pain , or abdominal swelling? No. Pain Score  0 *  Have you tolerated food without any problems? Yes.    Have you been able to return to your normal activities? Yes.    Do you have any questions about your discharge instructions: Diet   No. Medications  No. Follow up visit  No.  Do you have questions or concerns about your Care? No.  Actions: * If pain score is 4 or above: No action needed, pain <4.

## 2022-06-20 NOTE — Telephone Encounter (Signed)
Called pt to let them know that their request for hydrochlorothiazide was sent in, pt stated that he will call to see if it ready for pick up and will let us know if he does not receive it

## 2022-06-20 NOTE — Telephone Encounter (Signed)
I called pt to let him know that the abx Rx was sent in to [facility].

## 2022-07-02 ENCOUNTER — Ambulatory Visit (INDEPENDENT_AMBULATORY_CARE_PROVIDER_SITE_OTHER): Payer: Medicare Other

## 2022-07-02 VITALS — Ht 71.0 in | Wt 168.0 lb

## 2022-07-02 DIAGNOSIS — Z122 Encounter for screening for malignant neoplasm of respiratory organs: Secondary | ICD-10-CM

## 2022-07-02 DIAGNOSIS — Z0000 Encounter for general adult medical examination without abnormal findings: Secondary | ICD-10-CM | POA: Diagnosis not present

## 2022-07-02 NOTE — Progress Notes (Signed)
Virtual Visit via Telephone Note  I connected with  Mark Dunlap on 07/02/22 at 10:15 AM EDT by telephone and verified that I am speaking with the correct person using two identifiers.  Location: Patient: Home Provider: [place] Persons participating in the virtual visit: [place]   I discussed the limitations, risks, security and privacy concerns of performing an evaluation and management service by telephone and the availability of in person appointments. The patient expressed understanding and agreed to proceed.  Interactive audio and video telecommunications were attempted between this nurse and patient, however failed, due to patient having technical difficulties OR patient did not have access to video capability.  We continued and completed visit with audio only.  Some vital signs may be absent or patient reported.   Sheral Flow, LPN  Subjective:   Mark Dunlap is a 61 y.o. male who presents for Medicare Annual/Subsequent preventive examination.  Review of Systems     Cardiac Risk Factors include: advanced age (>33mn, >>26women);dyslipidemia;family history of premature cardiovascular disease;hypertension;male gender;smoking/ tobacco exposure     Objective:    Today's Vitals   07/02/22 1026  Weight: 168 lb (76.2 kg)  Height: '5\' 11"'$  (1.803 m)  PainSc: 0-No pain   Body mass index is 23.43 kg/m.     07/02/2022   10:30 AM 05/16/2021    3:46 PM 03/23/2021   10:40 AM 12/12/2020    8:35 AM 09/11/2020   12:18 PM 05/03/2019    2:38 PM 10/26/2018   11:51 AM  Advanced Directives  Does Patient Have a Medical Advance Directive? No No No No No No No  Would patient like information on creating a medical advance directive? No - Patient declined No - Patient declined No - Patient declined No - Patient declined No - Patient declined No - Patient declined No - Patient declined    Current Medications (verified) Outpatient Encounter  Medications as of 07/02/2022  Medication Sig   adapalene (DIFFERIN) 0.1 % gel Apply 1 Application topically at bedtime.   aspirin EC 81 MG tablet Take 81 mg by mouth daily. Swallow whole.   atorvastatin (LIPITOR) 40 MG tablet Take 1 tablet (40 mg total) by mouth daily.   bimatoprost (LUMIGAN) 0.01 % SOLN Place 1 drop into both eyes at bedtime.   citalopram (CELEXA) 20 MG tablet Take 1 tablet (20 mg total) by mouth daily.   clopidogrel (PLAVIX) 75 MG tablet Take 1 tablet (75 mg total) by mouth daily.   diazepam (VALIUM) 10 MG tablet Take 10 mg by mouth as needed. (Patient not taking: Reported on 06/19/2022)   diphenhydrAMINE (BENADRYL) 25 mg capsule Take 50 mg by mouth every 6 (six) hours as needed for itching.    doxycycline (VIBRA-TABS) 100 MG tablet Take 1 tablet (100 mg total) by mouth 2 (two) times daily.   famotidine (PEPCID) 40 MG tablet Take 1 tablet (40 mg total) by mouth 2 (two) times daily. Take every morning and at bedtime   fluticasone (FLONASE) 50 MCG/ACT nasal spray USE 2 SPRAYS DAILY IN BOTH NOSTRILS   gabapentin (NEURONTIN) 100 MG capsule Take 200 mg by mouth 2 (two) times daily.   hydrochlorothiazide (MICROZIDE) 12.5 MG capsule TAKE 1 CAPSULE BY MOUTH EVERY DAY   iron polysaccharides (NU-IRON) 150 MG capsule Take 1 capsule (150 mg total) by mouth daily.   lidocaine (XYLOCAINE) 2 % solution Use as directed 15 mLs in the mouth or throat daily as needed (acid reflux). (Patient not taking:  Reported on 06/19/2022)   Multiple Vitamins-Minerals (ONE DAILY FOR MEN/LYCOPENE) TABS Take 1 tablet by mouth daily.   naloxone (NARCAN) nasal spray 4 mg/0.1 mL Place 1 spray into the nose daily as needed. (Patient not taking: Reported on 06/19/2022)   nortriptyline (PAMELOR) 25 MG capsule Take 25 mg by mouth at bedtime.   oxybutynin (DITROPAN) 5 MG tablet Take 1 tablet (5 mg total) by mouth at bedtime.   oxyCODONE-acetaminophen (PERCOCET) 10-325 MG tablet Take 1 tablet by mouth every 6 (six) hours as  needed.   pantoprazole (PROTONIX) 40 MG tablet Take 1 tablet (40 mg total) by mouth daily.   sildenafil (VIAGRA) 100 MG tablet Take 100 mg by mouth daily.   tamsulosin (FLOMAX) 0.4 MG CAPS capsule Take 1 capsule (0.4 mg total) by mouth 2 (two) times daily.   timolol (TIMOPTIC) 0.5 % ophthalmic solution Place 1 drop into both eyes daily.   tiZANidine (ZANAFLEX) 4 MG tablet Take 4 mg by mouth every 8 (eight) hours as needed for muscle spasms.    tretinoin (RETIN-A) 0.1 % cream Apply 1 application  topically at bedtime.   zolpidem (AMBIEN) 10 MG tablet Take 1 tablet (10 mg total) by mouth at bedtime as needed. for sleep   No facility-administered encounter medications on file as of 07/02/2022.    Allergies (verified) Tramadol, Morphine and related, and Oxycodone   History: Past Medical History:  Diagnosis Date   Chronic back pain    upper to lower   Depression    GERD (gastroesophageal reflux disease)    Glaucoma, both eyes    H/O: substance abuse ([place])    03-21-2021   pt stated hx of ETOH/Crack cocaine-none since 12/2012 per pt/Does not Drive due to this   History of CVA (cerebrovascular accident) without residual deficits 10/26/2018   right BG/ CR infarct secondart to moderate SVD,  no residual   History of hyperthyroidism 2011   s/p RAI  06-20-2010,  followed by pcp   History of prostatitis 2010   HLD (hyperlipidemia)    Hypertension    followed by pcp    (nuclear stress test in epic 08-30-2013  normal no ischemia , ef 50%)   Legally blind in left eye, as defined in [place]    Blind Left eye, small amt vision Right eye   Lumbar radiculopathy    OA (osteoarthritis)    Prostate cancer [facility]) urologist--- dr Louis Meckel   dx 11-21-2020, Stage T1c, Gleason 4+3   Right thyroid nodule    Seasonal allergies    Spondylolisthesis, lumbar region    Wears dentures    upper full and lower partial   Wears glasses    Past Surgical History:  Procedure Laterality Date   ANTERIOR CERVICAL  DECOMP/DISCECTOMY FUSION  10/11/2011   Procedure: ANTERIOR CERVICAL DECOMPRESSION/DISCECTOMY FUSION 3 LEVELS;  Surgeon: Eustace Moore;  Location: [place] [facility];  Service: Neurosurgery;  Laterality: N/A;  Cervical three-four ,cervical four five cervical five six Anterior Cervical Decompression Fusion with peek + plate Nuvasive translational plate Orthofix peek (2 1/2 hours) Rm # [id number]   ANTERIOR CERVICAL DECOMP/DISCECTOMY FUSION N/A 07/29/2014   Procedure: ANTERIOR CERVICAL DECOMPRESSION/DISCECTOMY FUSION 1 LEVEL/HARDWARE REMOVAL;  Surgeon: Eustace Moore, MD;  Location: [place] [facility];  Service: Neurosurgery;  Laterality: N/A;  cervical six-seven   COLONOSCOPY     COLONOSCOPY  lastone 07-12-2020   CYSTOSCOPY N/A 03/23/2021   Procedure: CYSTOSCOPY FLEXIBLE;  Surgeon: [name], MD;  Location: [facility];  Service:  Urology;  Laterality: N/A;   HAND SURGERY Right x2  12/ 2007   I & D abscess   HARDWARE REMOVAL N/A 09/11/2020   Procedure: Removal of hardware LUmbar Four-Lumbar Five;  Surgeon: Eustace Moore, MD;  Location: [place];  Service: Neurosurgery;  Laterality: N/A;   LAMINECTOMY WITH POSTERIOR LATERAL ARTHRODESIS LEVEL 2 N/A 09/11/2020   Procedure: Laminectomy and Foraminotomy - Lumbar Three-Lumbar Four, posterior lateral fusion Lumbar Three-Four and Lumbar Four- Five;  Surgeon: Eustace Moore, MD;  Location: [place];  Service: Neurosurgery;  Laterality: N/A;   POSTERIOR CERVICAL FUSION/FORAMINOTOMY N/A 09/23/2013   Procedure: CERVICALTWO TO CERVICAL SEVEN POSTERIOR CERVICAL FUSION/FORAMINOTOMY WITH LATERAL MASS FIXATION;  Surgeon: Eustace Moore, MD;  Location: [place] [facility];  Service: Neurosurgery;  Laterality: N/A;   POSTERIOR LUMBAR FUSION  11-27-2017  '@[facility]'$    L4--5   RADIOACTIVE SEED IMPLANT N/A 03/23/2021   Procedure: RADIOACTIVE SEED IMPLANT/BRACHYTHERAPY IMPLANT;  Surgeon: [name], MD;  Location: [facility];  Service: Urology;  Laterality: N/A;   SPACE OAR  INSTILLATION N/A 03/23/2021   Procedure: SPACE OAR INSTILLATION;  Surgeon: [name], MD;  Location[facility];  Service: Urology;  Laterality: N/A;   TOTAL HIP ARTHROPLASTY  12/17/2011   Procedure: TOTAL HIP ARTHROPLASTY;  Surgeon: Johnny Bridge, MD;  Location: [place];  Service: Orthopedics;  Laterality: Left;   Family History  Problem Relation Age of Onset   Alcohol abuse Father    Stroke Father    Heart disease Father    Arthritis Mother    Hyperlipidemia Mother    Goiter Mother        resection of benign goiter   Hypertension Sister    Diabetes Sister    Goiter Sister        resection of benign goiter   Cancer Sister        oldest.type unknown.   Diabetes Brother    Cancer Brother        oldest.type unknown.   Alcohol abuse Brother    Alcohol abuse Cousin    Heart disease Other        Aunt   Colon cancer Neg Hx    Colon polyps Neg Hx    Esophageal cancer Neg Hx    Rectal cancer Neg Hx    Stomach cancer Neg Hx    Breast cancer Neg Hx    Prostate cancer Neg Hx    Pancreatic cancer Neg Hx    Social History   Socioeconomic History   Marital status: Single    Spouse name: Not on file   Number of children: 0   Years of education: Not on file   Highest education level: Not on file  Occupational History   Occupation: [employer]  Tobacco Use   Smoking status: Every Day    Packs/day: 0.75    Years: 47.00    Total pack years: 35.25    Types: Cigarettes   Smokeless tobacco: Never   Tobacco comments:    since age 9  Vaping Use   Vaping Use: Never used  Substance and Sexual Activity   Alcohol use: No    Alcohol/week: 0.0 standard drinks of alcohol    Comment: 03-21-2021 pt stated hx of alcohol abuse quit 2014   Drug use: Not Currently    Types: "Crack" cocaine    Comment: 03-21-2021 pt stated last use 12/2012   Sexual activity: Not Currently  Other Topics Concern  Not on file  Social History Narrative   Not on file   Social  Determinants of Health   Financial Resource Strain: Low Risk  (07/02/2022)   Overall Financial Resource Strain (CARDIA)    Difficulty of Paying Living Expenses: Not hard at all  Food Insecurity: No Food Insecurity (07/02/2022)   Hunger Vital Sign    Worried About Running Out of Food in the Last Year: Never true    Ran Out of Food in the Last Year: Never true  Transportation Needs: No Transportation Needs (07/02/2022)   PRAPARE - Hydrologist (Medical): No    Lack of Transportation (Non-Medical): No  Physical Activity: Inactive (07/02/2022)   Exercise Vital Sign    Days of Exercise per Week: 0 days    Minutes of Exercise per Session: 0 min  Stress: No Stress Concern Present (07/02/2022)   [place]    Feeling of Stress : Not at all  Social Connections: [place] (07/02/2022)   Social Connection and Isolation Panel [NHANES]    Frequency of Communication with Friends and Family: More than three times a week    Frequency of Social Gatherings with Friends and Family: More than three times a week    Attends Religious Services: More than 4 times per year    Active Member of [employer] or Organizations: Yes    Attends Music therapist: More than 4 times per year    Marital Status: Married    Tobacco Counseling Ready to quit: Not Answered Counseling given: Not Answered Tobacco comments: since age 53   Clinical Intake:  Pre-visit preparation completed: Yes  Pain : No/denies pain Pain Score: 0-No pain     BMI - recorded: 23.43 Nutritional Status: BMI of 19-24  Normal Nutritional Risks: Other (Comment) (decreased appetite) Diabetes: No  How often do you need to have someone help you when you read instructions, pamphlets, or other written materials from your doctor or pharmacy?: 1 - Never What is the last grade level you completed in school?: HSG  Diabetic?  no  Interpreter Needed?: No  Information entered by :: Lisette Abu, LPN.   Activities of Daily Living    07/02/2022   10:31 AM  In your present state of health, do you have any difficulty performing the following activities:  Hearing? 0  Vision? 1  Comment legally blind  Difficulty concentrating or making decisions? 0  Walking or climbing stairs? 0  Dressing or bathing? 0  Doing errands, shopping? 0  Preparing Food and eating ? N  Using the Toilet? N  In the past six months, have you accidently leaked urine? N  Do you have problems with loss of bowel control? N  Managing your Medications? N  Managing your Finances? N  Housekeeping or managing your Housekeeping? N    Patient Care Team: Biagio Borg, MD as PCP - Jeannette How, MD as Consulting Physician (Neurosurgery) Warden Fillers, MD as Consulting Physician (Ophthalmology) Cira Rue, RN as Oncology Nurse Navigator [name], MD as Attending Physician (Urology) Tyler Pita, MD as Consulting Physician (Radiation Oncology)  Indicate any recent Medical Services you may have received from other than [facility] providers in the past year (date may be approximate).     Assessment:   This is a routine wellness examination for Mark Dunlap.  Hearing/Vision screen Hearing Screening - Comments:: Denies hearing difficulties   Vision Screening - Comments:: Legally blind.  Dietary issues and exercise activities discussed: Current Exercise Habits: The patient has a physically strenuous job, but has no regular exercise apart from work., Exercise limited by: orthopedic condition(s)   Goals Addressed             This Visit's Progress    Quit Smoking       Printed materials mailed to patient.      Depression Screen    07/02/2022   10:29 AM 12/07/2021    4:28 PM 05/16/2021    4:13 PM 10/31/2020    3:50 PM 10/31/2020    3:34 PM 05/02/2020    9:33 AM 11/05/2019   11:27 AM  PHQ 2/9 Scores  PHQ - 2 Score 1 0 1  0 0 0 0  PHQ- 9 Score     0 0     Fall Risk    07/02/2022   10:30 AM 12/07/2021    4:28 PM 05/16/2021    4:17 PM 10/31/2020    3:50 PM 11/05/2019   11:27 AM  Fall Risk   Falls in the past year? 1 0 0 0 0  Number falls in past yr: 1 0 0    Injury with Fall? 1 0 0    Risk for fall due to : History of fall(s);Impaired balance/gait;Orthopedic patient  No Fall Risks    Follow up Education provided;Falls prevention discussed  Falls evaluation completed      FALL RISK PREVENTION PERTAINING TO THE HOME:  Any stairs in or around the home? No  If so, are there any without handrails? No  Home free of loose throw rugs in walkways, pet beds, electrical cords, etc? Yes  Adequate lighting in your home to reduce risk of falls? Yes   ASSISTIVE DEVICES UTILIZED TO PREVENT FALLS:  Life alert? No  Use of a cane, walker or w/c? No  Grab bars in the bathroom? No  Shower chair or bench in shower? Yes  Elevated toilet seat or a handicapped toilet? Yes   TIMED UP AND GO: PHONE VISIT  Was the test performed? No .    Cognitive Function:        07/02/2022   10:32 AM  6CIT Screen  What Year? 0 points  What month? 0 points  What time? 0 points  Count back from 20 0 points  Months in reverse 0 points  Repeat phrase 0 points  Total Score 0 points    Immunizations Immunization History  Administered Date(s) Administered   Influenza Split 08/20/2012   Influenza Whole 08/25/2008   Influenza,inj,Quad PF,6+ Mos 07/19/2013, 07/06/2014, 08/08/2015, 08/21/2017, 09/07/2018, 07/01/2019, 07/16/2021   Influenza-Unspecified 07/17/2020   Moderna Sars-Covid-2 Vaccination 11/30/2019, 12/29/2019, 08/11/2020   PNEUMOCOCCAL CONJUGATE-20 07/16/2021   Pneumococcal Polysaccharide-23 07/30/2014, 07/01/2019   Td 09/15/2006   Tdap 08/08/2015    TDAP status: Up to date  Flu Vaccine status: Due, Education has been provided regarding the importance of this vaccine. Advised may receive this vaccine at local  pharmacy or Health Dept. Aware to provide a copy of the vaccination record if obtained from local pharmacy or Health Dept. Verbalized acceptance and understanding.  Pneumococcal vaccine status: Up to date  Covid-19 vaccine status: Completed vaccines  Qualifies for Shingles Vaccine? Yes   Zostavax completed No   Shingrix Completed?: No.    Education has been provided regarding the importance of this vaccine. Patient has been advised to call insurance company to determine out of pocket expense if they have not yet received this vaccine.  Advised may also receive vaccine at local pharmacy or Health Dept. Verbalized acceptance and understanding.  Screening Tests Health Maintenance  Topic Date Due   Zoster Vaccines- Shingrix (1 of 2) Never done   COVID-19 Vaccine (4 - Moderna risk series) 10/06/2020   INFLUENZA VACCINE  05/14/2022   TETANUS/TDAP  08/07/2025   COLONOSCOPY (Pts 45-20yr Insurance coverage will need to be confirmed)  07/13/2027   Hepatitis C Screening  Completed   HIV Screening  Completed   HPV VACCINES  Aged Out    Health Maintenance  Health Maintenance Due  Topic Date Due   Zoster Vaccines- Shingrix (1 of 2) Never done   COVID-19 Vaccine (4 - Moderna risk series) 10/06/2020   INFLUENZA VACCINE  05/14/2022    Colorectal cancer screening: Type of screening: Colonoscopy. Completed 07/12/2020. Repeat every 7 years  Lung Cancer Screening: (Low Dose CT Chest recommended if Age 62-80years, 30 pack-year currently smoking OR have quit w/in 15years.) does qualify.   Lung Cancer Screening Referral: YES  Additional Screening:  Hepatitis C Screening: does qualify; Completed 09/13/2016  Vision Screening: Recommended annual ophthalmology exams for early detection of glaucoma and other disorders of the eye. Is the patient up to date with their annual eye exam?  Yes  Who is the provider or what is the name of the office in which the patient attends annual eye exams? CHRIS GROAT,  MD If pt is not established with a provider, would they like to be referred to a provider to establish care? No .   Dental Screening: Recommended annual dental exams for proper oral hygiene  Community Resource Referral / Chronic Care Management: CRR required this visit?  No   CCM required this visit?  No      Plan:     I have personally reviewed and noted the following in the patient's chart:   Medical and social history Use of alcohol, tobacco or illicit drugs  Current medications and supplements including opioid prescriptions. Patient is currently taking opioid prescriptions. Information provided to patient regarding non-opioid alternatives. Patient advised to discuss non-opioid treatment plan with their provider. Functional ability and status Nutritional status Physical activity Advanced directives List of other physicians Hospitalizations, surgeries, and ER visits in previous 12 months Vitals Screenings to include cognitive, depression, and falls Referrals and appointments  In addition, I have reviewed and discussed with patient certain preventive protocols, quality metrics, and best practice recommendations. A written personalized care plan for preventive services as well as general preventive health recommendations were provided to patient.     SSheral Flow LPN   [id number]  Nurse Notes:

## 2022-07-02 NOTE — Patient Instructions (Addendum)
[name] , Thank you for taking time to come for your Medicare Wellness Visit. I appreciate your ongoing commitment to your health goals. Please review the following plan we discussed and let me know if I can assist you in the future.   Screening recommendations/referrals: Colonoscopy: 07/12/2020; DUE EVERY 7 YEARS Recommended yearly ophthalmology/optometry visit for glaucoma screening and checkup Recommended yearly dental visit for hygiene and checkup  Vaccinations: Influenza vaccine: DUE 2023 Pneumococcal vaccine: 07/16/2021 Tdap vaccine: 08/08/2015 Shingles vaccine: DUE   Covid-19: 11/30/2019, 12/29/2019, 08/11/2020  Advanced directives: NO  Conditions/risks identified: YES; QUIT SMOKING  Next appointment: Follow up in one year for your annual wellness visit on 07/04/2023 at 10:00 a.m. via phone visit with Nurse Mignon Pine.  Preventive Care 40-64 Years, Male Preventive care refers to lifestyle choices and visits with your health care provider that can promote health and wellness. What does preventive care include? A yearly physical exam. This is also called an annual well check. Dental exams once or twice a year. Routine eye exams. Ask your health care provider how often you should have your eyes checked. Personal lifestyle choices, including: Daily care of your teeth and gums. Regular physical activity. Eating a healthy diet. Avoiding tobacco and drug use. Limiting alcohol use. Practicing safe sex. Taking low-dose aspirin every day starting at age 38. What happens during an annual well check? The services and screenings done by your health care provider during your annual well check will depend on your age, overall health, lifestyle risk factors, and family history of disease. Counseling  Your health care provider may ask you questions about your: Alcohol use. Tobacco use. Drug use. Emotional well-being. Home and relationship well-being. Sexual activity. Eating  habits. Work and work Statistician. Screening  You may have the following tests or measurements: Height, weight, and BMI. Blood pressure. Lipid and cholesterol levels. These may be checked every 5 years, or more frequently if you are over 22 years old. Skin check. Lung cancer screening. You may have this screening every year starting at age 81 if you have a 30-pack-year history of smoking and currently smoke or have quit within the past 15 years. Fecal occult blood test (FOBT) of the stool. You may have this test every year starting at age 5. Flexible sigmoidoscopy or colonoscopy. You may have a sigmoidoscopy every 5 years or a colonoscopy every 10 years starting at age 58. Prostate cancer screening. Recommendations will vary depending on your family history and other risks. Hepatitis C blood test. Hepatitis B blood test. Sexually transmitted disease (STD) testing. Diabetes screening. This is done by checking your blood sugar (glucose) after you have not eaten for a while (fasting). You may have this done every 1-3 years. Discuss your test results, treatment options, and if necessary, the need for more tests with your health care provider. Vaccines  Your health care provider may recommend certain vaccines, such as: Influenza vaccine. This is recommended every year. Tetanus, diphtheria, and acellular pertussis (Tdap, Td) vaccine. You may need a Td booster every 10 years. Zoster vaccine. You may need this after age 58. Pneumococcal 13-valent conjugate (PCV13) vaccine. You may need this if you have certain conditions and have not been vaccinated. Pneumococcal polysaccharide (PPSV23) vaccine. You may need one or two doses if you smoke cigarettes or if you have certain conditions. Talk to your health care provider about which screenings and vaccines you need and how often you need them. This information is not intended to replace advice given to you  by your health care provider. Make sure you  discuss any questions you have with your health care provider. Document Released: 10/27/2015 Document Revised: 06/19/2016 Document Reviewed: 08/01/2015 Elsevier Interactive Patient Education  2017 [place] Prevention in the Home Falls can cause injuries. They can happen to people of all ages. There are many things you can do to make your home safe and to help prevent falls. What can I do on the outside of my home? Regularly fix the edges of walkways and driveways and fix any cracks. Remove anything that might make you trip as you walk through a door, such as a raised step or threshold. Trim any bushes or trees on the path to your home. Use bright outdoor lighting. Clear any walking paths of anything that might make someone trip, such as rocks or tools. Regularly check to see if handrails are loose or broken. Make sure that both sides of any steps have handrails. Any raised decks and porches should have guardrails on the edges. Have any leaves, snow, or ice cleared regularly. Use sand or salt on walking paths during winter. Clean up any spills in your garage right away. This includes oil or grease spills. What can I do in the bathroom? Use night lights. Install grab bars by the toilet and in the tub and shower. Do not use towel bars as grab bars. Use non-skid mats or decals in the tub or shower. If you need to sit down in the shower, use a plastic, non-slip stool. Keep the floor dry. Clean up any water that spills on the floor as soon as it happens. Remove soap buildup in the tub or shower regularly. Attach bath mats securely with double-sided non-slip rug tape. Do not have throw rugs and other things on the floor that can make you trip. What can I do in the bedroom? Use night lights. Make sure that you have a light by your bed that is easy to reach. Do not use any sheets or blankets that are too big for your bed. They should not hang down onto the floor. Have a firm chair  that has side arms. You can use this for support while you get dressed. Do not have throw rugs and other things on the floor that can make you trip. What can I do in the kitchen? Clean up any spills right away. Avoid walking on wet floors. Keep items that you use a lot in easy-to-reach places. If you need to reach something above you, use a strong step stool that has a grab bar. Keep electrical cords out of the way. Do not use floor polish or wax that makes floors slippery. If you must use wax, use non-skid floor wax. Do not have throw rugs and other things on the floor that can make you trip. What can I do with my stairs? Do not leave any items on the stairs. Make sure that there are handrails on both sides of the stairs and use them. Fix handrails that are broken or loose. Make sure that handrails are as long as the stairways. Check any carpeting to make sure that it is firmly attached to the stairs. Fix any carpet that is loose or worn. Avoid having throw rugs at the top or bottom of the stairs. If you do have throw rugs, attach them to the floor with carpet tape. Make sure that you have a light switch at the top of the stairs and the bottom of the stairs. If  you do not have them, ask someone to add them for you. What else can I do to help prevent falls? Wear shoes that: Do not have high heels. Have rubber bottoms. Are comfortable and fit you well. Are closed at the toe. Do not wear sandals. If you use a stepladder: Make sure that it is fully opened. Do not climb a closed stepladder. Make sure that both sides of the stepladder are locked into place. Ask someone to hold it for you, if possible. Clearly mark and make sure that you can see: Any grab bars or handrails. First and last steps. Where the edge of each step is. Use tools that help you move around (mobility aids) if they are needed. These include: Canes. Walkers. Scooters. Crutches. Turn on the lights when you go into a  dark area. Replace any light bulbs as soon as they burn out. Set up your furniture so you have a clear path. Avoid moving your furniture around. If any of your floors are uneven, fix them. If there are any pets around you, be aware of where they are. Review your medicines with your doctor. Some medicines can make you feel dizzy. This can increase your chance of falling. Ask your doctor what other things that you can do to help prevent falls. This information is not intended to replace advice given to you by your health care provider. Make sure you discuss any questions you have with your health care provider. Document Released: 07/27/2009 Document Revised: 03/07/2016 Document Reviewed: 11/04/2014 Elsevier Interactive Patient Education  2017 [employer].

## 2022-07-10 ENCOUNTER — Ambulatory Visit: Payer: Medicare Other | Admitting: Podiatry

## 2022-07-10 DIAGNOSIS — M19071 Primary osteoarthritis, right ankle and foot: Secondary | ICD-10-CM

## 2022-07-10 DIAGNOSIS — M19072 Primary osteoarthritis, left ankle and foot: Secondary | ICD-10-CM | POA: Diagnosis not present

## 2022-07-10 NOTE — Progress Notes (Signed)
Subjective:  Patient ID: Mark Dunlap, male    DOB: 1961-07-20,  MRN: [id number]  Chief Complaint  Patient presents with   Arthritis    Pt stated that he is here for bilateral injections     61 y.o. male presents with the above complaint.  Patient is here for follow-up of bilateral first metatarsophalangeal joint pain with severe arthrosis.  He is here for his injection every few months.  His pain started acting back up again.   Review of Systems: Negative except as noted in the HPI. Denies N/V/F/Ch.  Past Medical History:  Diagnosis Date   Chronic back pain    upper to lower   Depression    GERD (gastroesophageal reflux disease)    Glaucoma, both eyes    H/O: substance abuse ([place])    03-21-2021   pt stated hx of ETOH/Crack cocaine-none since 12/2012 per pt/Does not Drive due to this   History of CVA (cerebrovascular accident) without residual deficits 10/26/2018   right BG/ CR infarct secondart to moderate SVD,  no residual   History of hyperthyroidism 2011   s/p RAI  06-20-2010,  followed by pcp   History of prostatitis 2010   HLD (hyperlipidemia)    Hypertension    followed by pcp    (nuclear stress test in epic 08-30-2013  normal no ischemia , ef 50%)   Legally blind in left eye, as defined in [place]    Blind Left eye, small amt vision Right eye   Lumbar radiculopathy    OA (osteoarthritis)    Prostate cancer [facility]) urologist--- dr Louis Meckel   dx 11-21-2020, Stage T1c, Gleason 4+3   Right thyroid nodule    Seasonal allergies    Spondylolisthesis, lumbar region    Wears dentures    upper full and lower partial   Wears glasses     Current Outpatient Medications:    adapalene (DIFFERIN) 0.1 % gel, Apply 1 Application topically at bedtime., Disp: 45 g, Rfl: 1   aspirin EC 81 MG tablet, Take 81 mg by mouth daily. Swallow whole., Disp: , Rfl:    atorvastatin (LIPITOR) 40 MG tablet, Take 1 tablet (40 mg total) by mouth daily., Disp: 90 tablet, Rfl: 3   bimatoprost  (LUMIGAN) 0.01 % SOLN, Place 1 drop into both eyes at bedtime., Disp: , Rfl:    citalopram (CELEXA) 20 MG tablet, Take 1 tablet (20 mg total) by mouth daily., Disp: 90 tablet, Rfl: 3   clopidogrel (PLAVIX) 75 MG tablet, Take 1 tablet (75 mg total) by mouth daily., Disp: 90 tablet, Rfl: 3   diazepam (VALIUM) 10 MG tablet, Take 10 mg by mouth as needed. (Patient not taking: Reported on 06/19/2022), Disp: , Rfl:    diphenhydrAMINE (BENADRYL) 25 mg capsule, Take 50 mg by mouth every 6 (six) hours as needed for itching. , Disp: , Rfl:    doxycycline (VIBRA-TABS) 100 MG tablet, Take 1 tablet (100 mg total) by mouth 2 (two) times daily., Disp: 20 tablet, Rfl: 0   famotidine (PEPCID) 40 MG tablet, Take 1 tablet (40 mg total) by mouth 2 (two) times daily. Take every morning and at bedtime, Disp: 180 tablet, Rfl: 3   fluticasone (FLONASE) 50 MCG/ACT nasal spray, USE 2 SPRAYS DAILY IN BOTH NOSTRILS, Disp: 32 mL, Rfl: 0   gabapentin (NEURONTIN) 100 MG capsule, Take 200 mg by mouth 2 (two) times daily., Disp: , Rfl: 2   hydrochlorothiazide (MICROZIDE) 12.5 MG capsule, TAKE 1 CAPSULE BY MOUTH EVERY  DAY, Disp: 90 capsule, Rfl: 3   iron polysaccharides (NU-IRON) 150 MG capsule, Take 1 capsule (150 mg total) by mouth daily., Disp: 90 capsule, Rfl: 3   lidocaine (XYLOCAINE) 2 % solution, Use as directed 15 mLs in the mouth or throat daily as needed (acid reflux). (Patient not taking: Reported on 06/19/2022), Disp: 100 mL, Rfl: 2   Multiple Vitamins-Minerals (ONE DAILY FOR MEN/LYCOPENE) TABS, Take 1 tablet by mouth daily., Disp: , Rfl:    naloxone (NARCAN) nasal spray 4 mg/0.1 mL, Place 1 spray into the nose daily as needed. (Patient not taking: Reported on 06/19/2022), Disp: , Rfl:    nortriptyline (PAMELOR) 25 MG capsule, Take 25 mg by mouth at bedtime., Disp: , Rfl: 0   oxybutynin (DITROPAN) 5 MG tablet, Take 1 tablet (5 mg total) by mouth at bedtime., Disp: 90 tablet, Rfl: 3   oxyCODONE-acetaminophen (PERCOCET) 10-325 MG  tablet, Take 1 tablet by mouth every 6 (six) hours as needed., Disp: , Rfl:    pantoprazole (PROTONIX) 40 MG tablet, Take 1 tablet (40 mg total) by mouth daily., Disp: 90 tablet, Rfl: 3   sildenafil (VIAGRA) 100 MG tablet, Take 100 mg by mouth daily., Disp: , Rfl:    tamsulosin (FLOMAX) 0.4 MG CAPS capsule, Take 1 capsule (0.4 mg total) by mouth 2 (two) times daily., Disp: 180 capsule, Rfl: 3   timolol (TIMOPTIC) 0.5 % ophthalmic solution, Place 1 drop into both eyes daily., Disp: , Rfl: 6   tiZANidine (ZANAFLEX) 4 MG tablet, Take 4 mg by mouth every 8 (eight) hours as needed for muscle spasms. , Disp: , Rfl:    tretinoin (RETIN-A) 0.1 % cream, Apply 1 application  topically at bedtime., Disp: 45 g, Rfl: 2   zolpidem (AMBIEN) 10 MG tablet, Take 1 tablet (10 mg total) by mouth at bedtime as needed. for sleep, Disp: 90 tablet, Rfl: 1  Social History   Tobacco Use  Smoking Status Every Day   Packs/day: 0.75   Years: 47.00   Total pack years: 35.25   Types: Cigarettes  Smokeless Tobacco Never  Tobacco Comments   since age 26    Allergies  Allergen Reactions   Tramadol Itching   Morphine And Related Itching   Oxycodone Itching   Objective:   There were no vitals filed for this visit. There is no height or weight on file to calculate BMI. Constitutional Well developed. Well nourished.  Vascular Dorsalis pedis pulses palpable bilaterally. Posterior tibial pulses palpable bilaterally. Capillary refill normal to all digits.  No cyanosis or clubbing noted. Pedal hair growth normal.  Neurologic Normal speech. Oriented to person, place, and time. Epicritic sensation to light touch grossly present bilaterally.  Dermatologic Nails well groomed and normal in appearance. No open wounds. No skin lesions.  Orthopedic:  Limited range of motion noted of bilateral first metatarsophalangeal joint with left more painful than right.  Intra-articular pain noted bilaterally.  Pain with end range  of motion.  Hallux limitus noted bilaterally.   Radiographs: 2 views of bilaterally skeletally mature adult foot: Severe arthritic changes noted to the right side.  Uneven joint space narrowing noted to the left side.  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  No other bony abnormalities identified mild arthritic changes noted to the dorsal aspect of the midfoot.  Mild elevatus present bilaterally. Assessment:   No diagnosis found.     Plan:  Patient was evaluated and treated and  all questions answered.  Bilateral first metatarsophalangeal joint arthritis with left more painful than right -Given clinically patient is improving and the pain is decreasing with steroid injection I believe he will benefit from another steroid injection to bilateral first metatarsophalangeal joint to help him get to closer to 95% or greater in terms of pain relief.  Patient states understanding would like to proceed with a injection to bilateral first metatarsophalangeal joint.  However at this time I am worried that his pain is not getting resolved and he is needing more injection.  I briefly discussed with him that he will need surgical intervention if this injection does not help.  For now patient states injections do help and once he gets his social situation under control he will proceed with surgery.  Patient states understanding -Another steroid injection was performed at bilateral first metatarsophalangeal joint using 1% plain Lidocaine and 10 mg of Kenalog. This was well tolerated. -I will also discussed with him orthotics management with Morton's extension take some of the stress off of the first metatarsophalangeal joint. -I will continue doing bilateral steroid injection as long as it is controlling his pain.  I will continue to do steroid injection until patient is ready for surgery about every 12 weeks No follow-ups on file.

## 2022-07-16 ENCOUNTER — Other Ambulatory Visit: Payer: Self-pay | Admitting: Neurological Surgery

## 2022-07-23 ENCOUNTER — Ambulatory Visit: Payer: Medicare Other | Admitting: Vascular Surgery

## 2022-07-23 ENCOUNTER — Encounter: Payer: Self-pay | Admitting: Vascular Surgery

## 2022-07-23 VITALS — BP 183/76 | HR 63 | Temp 98.1°F | Resp 16 | Ht 72.0 in | Wt 162.0 lb

## 2022-07-23 DIAGNOSIS — G8929 Other chronic pain: Secondary | ICD-10-CM | POA: Diagnosis not present

## 2022-07-23 DIAGNOSIS — M5441 Lumbago with sciatica, right side: Secondary | ICD-10-CM | POA: Diagnosis not present

## 2022-07-23 NOTE — Progress Notes (Signed)
Patient name: Mark Dunlap MRN: [id number] DOB: 1961-09-06 Sex: male  REASON FOR CONSULT: Abdominal exposure for L4-L5 ALIF  HPI: Mark Dunlap is a 61 y.o. male, with history of hypertension, hyperlipidemia, CVA, prostate cancer, chronic back pain that presents for evaluation for abdominal exposure for L4-L5 ALIF.  Patient has chronic lower back pain with right leg radiculopathy.  He is previously undergone at least 5 spine surgeries including L4-L5 posterior instrumentation that has subsequently been removed in 2021 by Dr. Ronnald Ramp.  Dr. Ronnald Ramp has now recommended an L4-L5 ALIF.  Vascular surgery was asked to assist with anterior spine exposure.  He denies any previous abdominal surgery or hernia repairs or abdominal mesh.  Past Medical History:  Diagnosis Date   Chronic back pain    upper to lower   Depression    GERD (gastroesophageal reflux disease)    Glaucoma, both eyes    H/O: substance abuse ([place])    03-21-2021   pt stated hx of ETOH/Crack cocaine-none since 12/2012 per pt/Does not Drive due to this   History of CVA (cerebrovascular accident) without residual deficits 10/26/2018   right BG/ CR infarct secondart to moderate SVD,  no residual   History of hyperthyroidism 2011   s/p RAI  06-20-2010,  followed by pcp   History of prostatitis 2010   HLD (hyperlipidemia)    Hypertension    followed by pcp    (nuclear stress test in epic 08-30-2013  normal no ischemia , ef 50%)   Legally blind in left eye, as defined in [place]    Blind Left eye, small amt vision Right eye   Lumbar radiculopathy    Lumbar radiculopathy    OA (osteoarthritis)    Prostate cancer [facility]) urologist--- dr Louis Meckel   dx 11-21-2020, Stage T1c, Gleason 4+3   Right thyroid nodule    Seasonal allergies    Spondylolisthesis, lumbar region    Spondylolisthesis, lumbar region    Unspecified fracture of unspecified lumbar vertebra, subsequent encounter for fracture with nonunion    Wears dentures    upper  full and lower partial   Wears glasses     Past Surgical History:  Procedure Laterality Date   ANTERIOR CERVICAL DECOMP/DISCECTOMY FUSION  10/11/2011   Procedure: ANTERIOR CERVICAL DECOMPRESSION/DISCECTOMY FUSION 3 LEVELS;  Surgeon: Eustace Moore;  Location: [place] [facility];  Service: Neurosurgery;  Laterality: N/A;  Cervical three-four ,cervical four five cervical five six Anterior Cervical Decompression Fusion with peek + plate Nuvasive translational plate Orthofix peek (2 1/2 hours) Rm # [id number]   ANTERIOR CERVICAL DECOMP/DISCECTOMY FUSION N/A 07/29/2014   Procedure: ANTERIOR CERVICAL DECOMPRESSION/DISCECTOMY FUSION 1 LEVEL/HARDWARE REMOVAL;  Surgeon: Eustace Moore, MD;  Location: [place] [facility];  Service: Neurosurgery;  Laterality: N/A;  cervical six-seven   COLONOSCOPY     COLONOSCOPY  lastone 07-12-2020   CYSTOSCOPY N/A 03/23/2021   Procedure: CYSTOSCOPY FLEXIBLE;  Surgeon: [name], MD;  Location: [facility];  Service: Urology;  Laterality: N/A;   HAND SURGERY Right x2  12/ 2007   I & D abscess   HARDWARE REMOVAL N/A 09/11/2020   Procedure: Removal of hardware LUmbar Four-Lumbar Five;  Surgeon: Eustace Moore, MD;  Location: [facility];  Service: Neurosurgery;  Laterality: N/A;   LAMINECTOMY WITH POSTERIOR LATERAL ARTHRODESIS LEVEL 2 N/A 09/11/2020   Procedure: Laminectomy and Foraminotomy - Lumbar Three-Lumbar Four, posterior lateral fusion Lumbar Three-Four and Lumbar Four- Five;  Surgeon: Eustace Moore, MD;  Location: [facility]  OR;  Service: Neurosurgery;  Laterality: N/A;   POSTERIOR CERVICAL FUSION/FORAMINOTOMY N/A 09/23/2013   Procedure: CERVICALTWO TO CERVICAL SEVEN POSTERIOR CERVICAL FUSION/FORAMINOTOMY WITH LATERAL MASS FIXATION;  Surgeon: Eustace Moore, MD;  Location: [place] [facility];  Service: Neurosurgery;  Laterality: N/A;   POSTERIOR LUMBAR FUSION  11-27-2017  '@[facility]'$    L4--5   RADIOACTIVE SEED IMPLANT N/A 03/23/2021   Procedure: RADIOACTIVE SEED IMPLANT/BRACHYTHERAPY IMPLANT;   Surgeon: [name], MD;  Location: [facility];  Service: Urology;  Laterality: N/A;   SPACE OAR INSTILLATION N/A 03/23/2021   Procedure: SPACE OAR INSTILLATION;  Surgeon: [name], MD;  Location: [facility];  Service: Urology;  Laterality: N/A;   TOTAL HIP ARTHROPLASTY  12/17/2011   Procedure: TOTAL HIP ARTHROPLASTY;  Surgeon: Johnny Bridge, MD;  Location: [place];  Service: Orthopedics;  Laterality: Left;    Family History  Problem Relation Age of Onset   Alcohol abuse Father    Stroke Father    Heart disease Father    Arthritis Mother    Hyperlipidemia Mother    Goiter Mother        resection of benign goiter   Hypertension Sister    Diabetes Sister    Goiter Sister        resection of benign goiter   Cancer Sister        oldest.type unknown.   Diabetes Brother    Cancer Brother        oldest.type unknown.   Alcohol abuse Brother    Alcohol abuse Cousin    Heart disease Other        Aunt   Colon cancer Neg Hx    Colon polyps Neg Hx    Esophageal cancer Neg Hx    Rectal cancer Neg Hx    Stomach cancer Neg Hx    Breast cancer Neg Hx    Prostate cancer Neg Hx    Pancreatic cancer Neg Hx     SOCIAL HISTORY: Social History   Socioeconomic History   Marital status: Single    Spouse name: Not on file   Number of children: 0   Years of education: 12   Highest education level: 12th grade  Occupational History   Occupation: [employer]  Tobacco Use   Smoking status: Every Day    Packs/day: 0.75    Years: 47.00    Total pack years: 35.25    Types: Cigarettes   Smokeless tobacco: Never   Tobacco comments:    since age 40  Vaping Use   Vaping Use: Never used  Substance and Sexual Activity   Alcohol use: No    Alcohol/week: 0.0 standard drinks of alcohol    Comment: 03-21-2021 pt stated hx of alcohol abuse quit 2014   Drug use: Not Currently    Types: "Crack" cocaine    Comment: 03-21-2021 pt stated last use 12/2012    Sexual activity: Not Currently  Other Topics Concern   Not on file  Social History Narrative   Not on file   Social Determinants of Health   Financial Resource Strain: Low Risk  (07/02/2022)   Overall Financial Resource Strain (CARDIA)    Difficulty of Paying Living Expenses: Not hard at all  Food Insecurity: No Food Insecurity (07/02/2022)   Hunger Vital Sign    Worried About Running Out of Food in the Last Year: Never true    Ran Out of Food in the Last Year: Never true  Transportation Needs: No Transportation Needs (07/02/2022)   PRAPARE - Hydrologist (Medical): No    Lack of Transportation (Non-Medical): No  Physical Activity: Inactive (07/02/2022)   Exercise Vital Sign    Days of Exercise per Week: 0 days    Minutes of Exercise per Session: 0 min  Stress: No Stress Concern Present (07/02/2022)   [place]    Feeling of Stress : Not at all  Social Connections: [place] (07/02/2022)   Social Connection and Isolation Panel [NHANES]    Frequency of Communication with Friends and Family: More than three times a week    Frequency of Social Gatherings with Friends and Family: More than three times a week    Attends Religious Services: More than 4 times per year    Active Member of [employer] or Organizations: Yes    Attends Music therapist: More than 4 times per year    Marital Status: Married  Human resources officer Violence: Not At Risk (07/02/2022)   Humiliation, Afraid, Rape, and Kick questionnaire    Fear of Current or Ex-Partner: No    Emotionally Abused: No    Physically Abused: No    Sexually Abused: No    Allergies  Allergen Reactions   Tramadol Itching   Morphine And Related Itching   Oxycodone Itching    Current Outpatient Medications  Medication Sig Dispense Refill   adapalene (DIFFERIN) 0.1 % gel Apply 1 Application topically at bedtime. 45 g 1    aspirin EC 81 MG tablet Take 81 mg by mouth daily. Swallow whole.     atorvastatin (LIPITOR) 40 MG tablet Take 1 tablet (40 mg total) by mouth daily. 90 tablet 3   bimatoprost (LUMIGAN) 0.01 % SOLN Place 1 drop into both eyes at bedtime.     citalopram (CELEXA) 20 MG tablet Take 1 tablet (20 mg total) by mouth daily. 90 tablet 3   clopidogrel (PLAVIX) 75 MG tablet Take 1 tablet (75 mg total) by mouth daily. 90 tablet 3   diazepam (VALIUM) 10 MG tablet Take 10 mg by mouth as needed. (Patient not taking: Reported on 06/19/2022)     diphenhydrAMINE (BENADRYL) 25 mg capsule Take 50 mg by mouth every 6 (six) hours as needed for itching.      doxycycline (VIBRA-TABS) 100 MG tablet Take 1 tablet (100 mg total) by mouth 2 (two) times daily. 20 tablet 0   famotidine (PEPCID) 40 MG tablet Take 1 tablet (40 mg total) by mouth 2 (two) times daily. Take every morning and at bedtime 180 tablet 3   fluticasone (FLONASE) 50 MCG/ACT nasal spray USE 2 SPRAYS DAILY IN BOTH NOSTRILS 32 mL 0   gabapentin (NEURONTIN) 100 MG capsule Take 200 mg by mouth 2 (two) times daily.  2   hydrochlorothiazide (MICROZIDE) 12.5 MG capsule TAKE 1 CAPSULE BY MOUTH EVERY DAY 90 capsule 3   iron polysaccharides (NU-IRON) 150 MG capsule Take 1 capsule (150 mg total) by mouth daily. 90 capsule 3   lidocaine (XYLOCAINE) 2 % solution Use as directed 15 mLs in the mouth or throat daily as needed (acid reflux). (Patient not taking: Reported on 06/19/2022) 100 mL 2   Multiple Vitamins-Minerals (ONE DAILY FOR MEN/LYCOPENE) TABS Take 1 tablet by mouth daily.     naloxone (NARCAN) nasal spray 4 mg/0.1 mL Place 1 spray into the nose daily as needed. (Patient not taking: Reported on 06/19/2022)  nortriptyline (PAMELOR) 25 MG capsule Take 25 mg by mouth at bedtime.  0   oxybutynin (DITROPAN) 5 MG tablet Take 1 tablet (5 mg total) by mouth at bedtime. 90 tablet 3   oxyCODONE-acetaminophen (PERCOCET) 10-325 MG tablet Take 1 tablet by mouth every 6 (six)  hours as needed.     pantoprazole (PROTONIX) 40 MG tablet Take 1 tablet (40 mg total) by mouth daily. 90 tablet 3   sildenafil (VIAGRA) 100 MG tablet Take 100 mg by mouth daily.     tamsulosin (FLOMAX) 0.4 MG CAPS capsule Take 1 capsule (0.4 mg total) by mouth 2 (two) times daily. 180 capsule 3   timolol (TIMOPTIC) 0.5 % ophthalmic solution Place 1 drop into both eyes daily.  6   tiZANidine (ZANAFLEX) 4 MG tablet Take 4 mg by mouth every 8 (eight) hours as needed for muscle spasms.      tretinoin (RETIN-A) 0.1 % cream Apply 1 application  topically at bedtime. 45 g 2   zolpidem (AMBIEN) 10 MG tablet Take 1 tablet (10 mg total) by mouth at bedtime as needed. for sleep 90 tablet 1   No current facility-administered medications for this visit.    REVIEW OF SYSTEMS:  '[X]'$  denotes positive finding, '[ ]'$  denotes negative finding Cardiac  Comments:  Chest pain or chest pressure:    Shortness of breath upon exertion:    Short of breath when lying flat:    Irregular heart rhythm:        Vascular    Pain in calf, thigh, or hip brought on by ambulation: x   Pain in feet at night that wakes you up from your sleep:     Blood clot in your veins:    Leg swelling:         Pulmonary    Oxygen at home:    Productive cough:     Wheezing:         Neurologic    Sudden weakness in arms or legs:     Sudden numbness in arms or legs:     Sudden onset of difficulty speaking or slurred speech:    Temporary loss of vision in one eye:     Problems with dizziness:         Gastrointestinal    Blood in stool:     Vomited blood:         Genitourinary    Burning when urinating:     Blood in urine:        Psychiatric    Major depression:         Hematologic    Bleeding problems:    Problems with blood clotting too easily:        Skin    Rashes or ulcers:        Constitutional    Fever or chills:      PHYSICAL EXAM: There were no vitals filed for this visit.  GENERAL: The patient is a  well-nourished male, in no acute distress. The vital signs are documented above. CARDIAC: There is a regular rate and rhythm.  VASCULAR:  Palpable femoral pulses bilaterally Palpable DP pulses bilaterally PULMONARY: No respiratory distress ABDOMEN: Soft and non-tender.  No previous abdominal incisions. MUSCULOSKELETAL: There are no major deformities or cyanosis. NEUROLOGIC: No focal weakness or paresthesias are detected. SKIN: There are no ulcers or rashes noted. PSYCHIATRIC: The patient has a normal affect.  DATA:   CT Lumbar Spine 06/07/22     Assessment/Plan:  61 y.o. male, with history of hypertension, hyperlipidemia, CVA, prostate cancer, chronic back pain that presents for evaluation for abdominal exposure for L4-L5 ALIF.  Patient has chronic lower back pain with right leg radiculopathy.  I reviewed his CT lumbar spine and discussed he would be a good candidate for anterior approach.  Discussed paramedian incision over the left rectus muscle with mobilization of the rectus muscle and then entering the retroperitoneum by mobilizing the peritoneum and left ureter to the midline.  Discussed mobilizing left iliac artery and vein to expose the disc space from the front.  Discussed risk of injury to the above structures.  Answered all of his questions.  Look forward to assisting Dr. Ronnald Ramp for L4-L5 ALIF.  Patient is scheduled on 08/14/2022.   Marty Heck, MD [facility] Office: [phone]

## 2022-08-01 NOTE — Pre-Procedure Instructions (Signed)
Surgical Instructions    Your procedure is scheduled on Wednesday November 1.  Report to [facility] Main Entrance "A" at 6:30 A.M., then check in with the Admitting office.  Call this number if you have problems the morning of surgery:  [phone]   If you have any questions prior to your surgery date call [phone]: Open Monday-Friday 8am-4pm If you experience any cold or flu symptoms such as cough, fever, chills, shortness of breath, etc. between now and your scheduled surgery, please notify us at the above number     Remember:  Do not eat or drink after midnight the night before your surgery   Take these medicines the morning of surgery with A SIP OF WATER:  citalopram (CELEXA) famotidine (PEPCID)  gabapentin (NEURONTIN)  omeprazole (PRILOSEC OTC)  pantoprazole (PROTONIX)  Eye drops fluticasone (FLONASE) 50 MCG/ACT nasal spray if needed oxyCODONE-acetaminophen (PERCOCET) if needed  Follow your surgeon's instructions on when to stop Aspirin and Clopidogrel (Plavix).  If no instructions were given by your surgeon then you will need to call the office to get those instructions.     As of today, STOP taking any Aleve, Naproxen, Ibuprofen, Motrin, Advil, Goody's, BC's, all herbal medications, fish oil, and all vitamins.   [facility] is not responsible for any belongings or valuables.    Do NOT Smoke (Tobacco/Vaping)  24 hours prior to your procedure  If you use a CPAP at night, you may bring your mask for your overnight stay.   Contacts, glasses, hearing aids, dentures or partials may not be worn into surgery, please bring cases for these belongings   For patients admitted to the hospital, discharge time will be determined by your treatment team.   Patients discharged the day of surgery will not be allowed to drive home, and someone needs to stay with them for 24 hours.   SURGICAL WAITING ROOM VISITATION Patients having surgery or a procedure may have no more than 2  support people in the waiting area - these visitors may rotate.   Children under the age of 4 must have an adult with them who is not the patient. If the patient needs to stay at the hospital during part of their recovery, the visitor guidelines for inpatient rooms apply. Pre-op nurse will coordinate an appropriate time for 1 support person to accompany patient in pre-op.  This support person may not rotate.   Please refer to [email or web address] for the visitor guidelines for Inpatients (after your surgery is over and you are in a regular room).    Special instructions:    Oral Hygiene is also important to reduce your risk of infection.  Remember - BRUSH YOUR TEETH THE MORNING OF SURGERY WITH YOUR REGULAR TOOTHPASTE   [facility]- Preparing For Surgery  Before surgery, you can play an important role. Because skin is not sterile, your skin needs to be as free of germs as possible. You can reduce the number of germs on your skin by washing with CHG (chlorahexidine gluconate) Soap before surgery.  CHG is an antiseptic cleaner which kills germs and bonds with the skin to continue killing germs even after washing.     Please do not use if you have an allergy to CHG or antibacterial soaps. If your skin becomes reddened/irritated stop using the CHG.  Do not shave (including legs and underarms) for at least 48 hours prior to first CHG shower. It is OK to shave your face.  Please follow these instructions carefully.  Shower the NIGHT BEFORE SURGERY and the MORNING OF SURGERY with CHG Soap.   If you chose to wash your hair, wash your hair first as usual with your normal shampoo. After you shampoo, rinse your hair and body thoroughly to remove the shampoo.  Then [employer] and genitals (private parts) with your normal soap and rinse thoroughly to remove soap.  After that Use CHG Soap as you would any other liquid soap. You can apply CHG directly to  the skin and wash gently with a scrungie or a clean washcloth.   Apply the CHG Soap to your body ONLY FROM THE NECK DOWN.  Do not use on open wounds or open sores. Avoid contact with your eyes, ears, mouth and genitals (private parts). Wash Face and genitals (private parts)  with your normal soap.   Wash thoroughly, paying special attention to the area where your surgery will be performed.  Thoroughly rinse your body with warm water from the neck down.  DO NOT shower/wash with your normal soap after using and rinsing off the CHG Soap.  Pat yourself dry with a CLEAN TOWEL.  Wear CLEAN PAJAMAS to bed the night before surgery  Place CLEAN SHEETS on your bed the night before your surgery  DO NOT SLEEP WITH PETS.   Day of Surgery:  Take a shower with CHG soap. Wear Clean/Comfortable clothing the morning of surgery Do not wear jewelry or makeup. Do not wear lotions, powders, perfumes/cologne or deodorant. Do not shave 48 hours prior to surgery.  Men may shave face and neck. Do not bring valuables to the hospital. Do not wear nail polish, gel polish, artificial nails, or any other type of covering on natural nails (fingers and toes) If you have artificial nails or gel coating that need to be removed by a nail salon, please have this removed prior to surgery. Artificial nails or gel coating may interfere with anesthesia's ability to adequately monitor your vital signs. Remember to brush your teeth WITH YOUR REGULAR TOOTHPASTE.    If you received a COVID test during your pre-op visit, it is requested that you wear a mask when out in public, stay away from anyone that may not be feeling well, and notify your surgeon if you develop symptoms. If you have been in contact with anyone that has tested positive in the last 10 days, please notify your surgeon.    Please read over the following fact sheets that you were given.

## 2022-08-02 ENCOUNTER — Encounter (HOSPITAL_COMMUNITY)
Admission: RE | Admit: 2022-08-02 | Discharge: 2022-08-02 | Disposition: A | Discharge status: Home / Self Care | Payer: Medicare Other | Source: Ambulatory Visit | Attending: Neurological Surgery | Admitting: Neurological Surgery

## 2022-08-02 ENCOUNTER — Encounter (HOSPITAL_COMMUNITY): Payer: Self-pay

## 2022-08-02 ENCOUNTER — Other Ambulatory Visit: Payer: Self-pay

## 2022-08-02 VITALS — BP 120/73 | HR 70 | Temp 97.7°F | Resp 17 | Ht 72.0 in | Wt 159.2 lb

## 2022-08-02 DIAGNOSIS — I700 Atherosclerosis of aorta: Secondary | ICD-10-CM | POA: Insufficient documentation

## 2022-08-02 DIAGNOSIS — Z01818 Encounter for other preprocedural examination: Secondary | ICD-10-CM | POA: Diagnosis not present

## 2022-08-02 DIAGNOSIS — D509 Iron deficiency anemia, unspecified: Secondary | ICD-10-CM | POA: Insufficient documentation

## 2022-08-02 HISTORY — DX: Atherosclerosis of aorta: I70.0

## 2022-08-02 LAB — TYPE AND SCREEN
ABO/RH(D): B POS
Antibody Screen: NEGATIVE

## 2022-08-02 LAB — BASIC METABOLIC PANEL
Anion gap: 11 (ref 5–15)
BUN: 8 mg/dL (ref 8–23)
CO2: 29 mmol/L (ref 22–32)
Calcium: 10 mg/dL (ref 8.9–10.3)
Chloride: 98 mmol/L (ref 98–111)
Creatinine, Ser: 1.07 mg/dL (ref 0.61–1.24)
GFR, Estimated: >60 mL/min (ref >60)
Glucose, Bld: 101 mg/dL — ABNORMAL HIGH (ref 70–99)
Potassium: 3.6 mmol/L (ref 3.5–5.1)
Sodium: 138 mmol/L (ref 135–145)

## 2022-08-02 LAB — CBC
HCT: 35.4 % — ABNORMAL LOW (ref 39.0–52.0)
Hemoglobin: 10.7 g/dL — ABNORMAL LOW (ref 13.0–17.0)
MCH: 23.5 pg — ABNORMAL LOW (ref 26.0–34.0)
MCHC: 30.2 g/dL (ref 30.0–36.0)
MCV: 77.8 fL — ABNORMAL LOW (ref 80.0–100.0)
Platelets: 408 10*3/uL — ABNORMAL HIGH (ref 150–400)
RBC: 4.55 MIL/uL (ref 4.22–5.81)
RDW: 16.9 % — ABNORMAL HIGH (ref 11.5–15.5)
WBC: 9 10*3/uL (ref 4.0–10.5)
nRBC: 0 % (ref 0.0–0.2)

## 2022-08-02 LAB — PROTIME-INR
INR: 1 (ref 0.8–1.2)
Prothrombin Time: 12.9 seconds (ref 11.4–15.2)

## 2022-08-02 LAB — SURGICAL PCR SCREEN
MRSA, PCR: NEGATIVE
Staphylococcus aureus: NEGATIVE

## 2022-08-02 NOTE — Progress Notes (Signed)
PCP - Dr. Cathlean Cower Cardiologist - denies  PPM/ICD - denies   Chest x-ray - 01/16/21 EKG - 08/02/22 Stress Test - 08/31/13 ECHO - 10/26/18 Cardiac Cath - denies  Sleep Study - denies  DM- denies  Last dose of GLP1 agonist-  n/a   Blood Thinner Instructions: Hold Plavix 5-7 days prior to surgery. Aspirin Instructions: Hold ASA 5-7 days prior to surgery Pt has held both ASA and Plavix since 10/16  ERAS Protcol - no, NPO   COVID TEST- n/a   Anesthesia review: no  Patient denies shortness of breath, fever, cough and chest pain at PAT appointment   All instructions explained to the patient, with a verbal understanding of the material. Patient agrees to go over the instructions while at home for a better understanding. The opportunity to ask questions was provided.

## 2022-08-13 NOTE — Anesthesia Preprocedure Evaluation (Addendum)
Anesthesia Evaluation  Patient identified by MRN, date of birth, ID band Patient awake    Reviewed: Allergy & Precautions, NPO status , Patient's Chart, lab work & pertinent test results  Airway Mallampati: II  TM Distance: >3 FB Neck ROM: Full    Dental  (+) Dental Advisory Given, Edentulous Upper   Pulmonary Current SmokerPatient did not abstain from smoking.,    Pulmonary exam normal breath sounds clear to auscultation       Cardiovascular hypertension, + Peripheral Vascular Disease  Normal cardiovascular exam Rhythm:Regular Rate:Normal  Echo 10/26/18: - Left ventricle: The cavity size was normal. Wall thickness was  increased in a pattern of mild LVH. Systolic function was normal.  The estimated ejection fraction was in the range of 60% to 65%.  Wall motion was normal; there were no regional wall motion  abnormalities. Left ventricular diastolic function parameters  were normal.  - Left atrium: The atrium was normal in size.  - Inferior vena cava: The vessel was normal in size. The  respirophasic diameter changes were in the normal range (>= 50%),  consistent with normal central venous pressure.    Neuro/Psych PSYCHIATRIC DISORDERS Anxiety Depression Spondylolisthesis TIA Neuromuscular disease CVA, Residual Symptoms    GI/Hepatic GERD  Medicated,(+)     substance abuse  cocaine use,   Endo/Other  negative endocrine ROS  Renal/GU Renal InsufficiencyRenal disease   Prostate cancer     Musculoskeletal  (+) Arthritis ,   Abdominal   Peds  Hematology  (+) Blood dyscrasia (Plavix), anemia ,   Anesthesia Other Findings   Reproductive/Obstetrics                            Anesthesia Physical Anesthesia Plan  ASA: 3  Anesthesia Plan: General   Post-op Pain Management: Tylenol PO (pre-op)* and Ketamine IV*   Induction: Intravenous  PONV Risk Score and Plan: 2 and  Midazolam, Dexamethasone and Ondansetron  Airway Management Planned: Oral ETT  Additional Equipment: Arterial line  Intra-op Plan:   Post-operative Plan: Extubation in OR  Informed Consent: I have reviewed the patients History and Physical, chart, labs and discussed the procedure including the risks, benefits and alternatives for the proposed anesthesia with the patient or authorized representative who has indicated his/her understanding and acceptance.     Dental advisory given  Plan Discussed with: CRNA  Anesthesia Plan Comments: (2nd PIV)      Anesthesia Quick Evaluation

## 2022-08-14 ENCOUNTER — Inpatient Hospital Stay (HOSPITAL_COMMUNITY): Payer: Medicare Other | Admitting: Certified Registered"

## 2022-08-14 ENCOUNTER — Inpatient Hospital Stay (HOSPITAL_COMMUNITY): Payer: Medicare Other

## 2022-08-14 ENCOUNTER — Other Ambulatory Visit: Payer: Self-pay

## 2022-08-14 ENCOUNTER — Encounter (HOSPITAL_COMMUNITY)
Admission: RE | Disposition: A | Discharge status: Home / Self Care | Payer: Self-pay | Source: Home / Self Care | Attending: Neurological Surgery

## 2022-08-14 ENCOUNTER — Encounter (HOSPITAL_COMMUNITY): Payer: Self-pay | Admitting: Neurological Surgery

## 2022-08-14 ENCOUNTER — Inpatient Hospital Stay (HOSPITAL_COMMUNITY)
Admission: RE | Admit: 2022-08-14 | Discharge: 2022-08-15 | DRG: 460 | Disposition: A | Discharge status: Home / Self Care | Payer: Medicare Other | Attending: Neurological Surgery | Admitting: Neurological Surgery

## 2022-08-14 DIAGNOSIS — Z83438 Family history of other disorder of lipoprotein metabolism and other lipidemia: Secondary | ICD-10-CM | POA: Diagnosis not present

## 2022-08-14 DIAGNOSIS — Z8249 Family history of ischemic heart disease and other diseases of the circulatory system: Secondary | ICD-10-CM | POA: Diagnosis not present

## 2022-08-14 DIAGNOSIS — K219 Gastro-esophageal reflux disease without esophagitis: Secondary | ICD-10-CM | POA: Diagnosis present

## 2022-08-14 DIAGNOSIS — Z823 Family history of stroke: Secondary | ICD-10-CM

## 2022-08-14 DIAGNOSIS — M48061 Spinal stenosis, lumbar region without neurogenic claudication: Secondary | ICD-10-CM

## 2022-08-14 DIAGNOSIS — M5416 Radiculopathy, lumbar region: Secondary | ICD-10-CM | POA: Diagnosis present

## 2022-08-14 DIAGNOSIS — Z7982 Long term (current) use of aspirin: Secondary | ICD-10-CM

## 2022-08-14 DIAGNOSIS — F17210 Nicotine dependence, cigarettes, uncomplicated: Secondary | ICD-10-CM | POA: Diagnosis present

## 2022-08-14 DIAGNOSIS — Z833 Family history of diabetes mellitus: Secondary | ICD-10-CM | POA: Diagnosis not present

## 2022-08-14 DIAGNOSIS — F32A Depression, unspecified: Secondary | ICD-10-CM | POA: Diagnosis present

## 2022-08-14 DIAGNOSIS — M960 Pseudarthrosis after fusion or arthrodesis: Secondary | ICD-10-CM | POA: Diagnosis present

## 2022-08-14 DIAGNOSIS — E785 Hyperlipidemia, unspecified: Secondary | ICD-10-CM | POA: Diagnosis present

## 2022-08-14 DIAGNOSIS — Z8546 Personal history of malignant neoplasm of prostate: Secondary | ICD-10-CM | POA: Diagnosis not present

## 2022-08-14 DIAGNOSIS — H409 Unspecified glaucoma: Secondary | ICD-10-CM | POA: Diagnosis present

## 2022-08-14 DIAGNOSIS — Z8673 Personal history of transient ischemic attack (TIA), and cerebral infarction without residual deficits: Secondary | ICD-10-CM | POA: Diagnosis not present

## 2022-08-14 DIAGNOSIS — Z885 Allergy status to narcotic agent status: Secondary | ICD-10-CM | POA: Diagnosis not present

## 2022-08-14 DIAGNOSIS — I700 Atherosclerosis of aorta: Secondary | ICD-10-CM | POA: Diagnosis present

## 2022-08-14 DIAGNOSIS — G8929 Other chronic pain: Secondary | ICD-10-CM | POA: Diagnosis present

## 2022-08-14 DIAGNOSIS — H548 Legal blindness, as defined in USA: Secondary | ICD-10-CM | POA: Diagnosis present

## 2022-08-14 DIAGNOSIS — Z8261 Family history of arthritis: Secondary | ICD-10-CM

## 2022-08-14 DIAGNOSIS — Z888 Allergy status to other drugs, medicaments and biological substances status: Secondary | ICD-10-CM | POA: Diagnosis not present

## 2022-08-14 DIAGNOSIS — I10 Essential (primary) hypertension: Secondary | ICD-10-CM | POA: Diagnosis present

## 2022-08-14 DIAGNOSIS — Z981 Arthrodesis status: Principal | ICD-10-CM

## 2022-08-14 DIAGNOSIS — Z96642 Presence of left artificial hip joint: Secondary | ICD-10-CM | POA: Diagnosis present

## 2022-08-14 DIAGNOSIS — M4316 Spondylolisthesis, lumbar region: Secondary | ICD-10-CM | POA: Diagnosis present

## 2022-08-14 DIAGNOSIS — Z79899 Other long term (current) drug therapy: Secondary | ICD-10-CM

## 2022-08-14 DIAGNOSIS — Z7902 Long term (current) use of antithrombotics/antiplatelets: Secondary | ICD-10-CM

## 2022-08-14 DIAGNOSIS — M532X6 Spinal instabilities, lumbar region: Secondary | ICD-10-CM | POA: Diagnosis present

## 2022-08-14 DIAGNOSIS — F418 Other specified anxiety disorders: Secondary | ICD-10-CM

## 2022-08-14 HISTORY — PX: ABDOMINAL EXPOSURE: SHX5708

## 2022-08-14 HISTORY — PX: ANTERIOR LUMBAR FUSION: SHX1170

## 2022-08-14 SURGERY — ANTERIOR LUMBAR FUSION 1 LEVEL
Anesthesia: General

## 2022-08-14 MED ORDER — EPHEDRINE SULFATE-NACL 50-0.9 MG/10ML-% IV SOSY
PREFILLED_SYRINGE | INTRAVENOUS | Status: DC | PRN
  Administered 2022-08-14: 10 mg via INTRAVENOUS

## 2022-08-14 MED ORDER — LACTATED RINGERS IV SOLN: INTRAVENOUS | Status: DC

## 2022-08-14 MED ORDER — PROMETHAZINE HCL 25 MG/ML IJ SOLN: 6.2500 mg | INTRAMUSCULAR | Status: DC | PRN

## 2022-08-14 MED ORDER — KETAMINE HCL-SODIUM CHLORIDE 100-0.9 MG/10ML-% IV SOSY
PREFILLED_SYRINGE | INTRAVENOUS | Status: DC | PRN
  Administered 2022-08-14: 30 mg via INTRAVENOUS
  Administered 2022-08-14: 10 mg via INTRAVENOUS

## 2022-08-14 MED ORDER — FENTANYL CITRATE (PF) 100 MCG/2ML IJ SOLN
INTRAMUSCULAR | Status: AC
  Filled 2022-08-14: qty 2

## 2022-08-14 MED ORDER — GABAPENTIN 100 MG PO CAPS
200.0000 mg | ORAL_CAPSULE | Freq: Two times a day (BID) | ORAL | Status: DC
  Administered 2022-08-14 – 2022-08-15 (×2): 200 mg via ORAL
  Filled 2022-08-14 (×2): qty 2

## 2022-08-14 MED ORDER — SODIUM CHLORIDE 0.9 % IV SOLN
250.0000 mL | INTRAVENOUS | Status: DC
  Administered 2022-08-14: 250 mL via INTRAVENOUS

## 2022-08-14 MED ORDER — CHLORHEXIDINE GLUCONATE CLOTH 2 % EX PADS: 6.0000 | MEDICATED_PAD | Freq: Once | CUTANEOUS | Status: DC

## 2022-08-14 MED ORDER — ADAPALENE 0.1 % EX GEL: 1.0000 | CUTANEOUS | Status: DC

## 2022-08-14 MED ORDER — SENNA 8.6 MG PO TABS
1.0000 | ORAL_TABLET | Freq: Two times a day (BID) | ORAL | Status: DC
  Administered 2022-08-14 – 2022-08-15 (×3): 8.6 mg via ORAL
  Filled 2022-08-14 (×3): qty 1

## 2022-08-14 MED ORDER — CITALOPRAM HYDROBROMIDE 20 MG PO TABS
20.0000 mg | ORAL_TABLET | Freq: Every day | ORAL | Status: DC
  Administered 2022-08-15: 20 mg via ORAL
  Filled 2022-08-14: qty 1

## 2022-08-14 MED ORDER — OXYCODONE-ACETAMINOPHEN 5-325 MG PO TABS
2.0000 | ORAL_TABLET | ORAL | Status: DC | PRN
  Administered 2022-08-14 – 2022-08-15 (×4): 2 via ORAL
  Filled 2022-08-14 (×4): qty 2

## 2022-08-14 MED ORDER — MIDAZOLAM HCL 2 MG/2ML IJ SOLN
INTRAMUSCULAR | Status: AC
  Filled 2022-08-14: qty 2

## 2022-08-14 MED ORDER — LATANOPROST 0.005 % OP SOLN
1.0000 [drp] | Freq: Every day | OPHTHALMIC | Status: DC
  Administered 2022-08-14: 1 [drp] via OPHTHALMIC
  Filled 2022-08-14: qty 2.5

## 2022-08-14 MED ORDER — SUFENTANIL CITRATE 50 MCG/ML IV SOLN
INTRAVENOUS | Status: AC
  Filled 2022-08-14: qty 1

## 2022-08-14 MED ORDER — FAMOTIDINE 20 MG PO TABS
40.0000 mg | ORAL_TABLET | Freq: Two times a day (BID) | ORAL | Status: DC
  Administered 2022-08-14: 40 mg via ORAL
  Filled 2022-08-14: qty 2

## 2022-08-14 MED ORDER — HYDROMORPHONE HCL 1 MG/ML IJ SOLN
0.5000 mg | INTRAMUSCULAR | Status: DC | PRN
  Administered 2022-08-14: 0.5 mg via INTRAVENOUS
  Filled 2022-08-14: qty 0.5

## 2022-08-14 MED ORDER — CEFAZOLIN SODIUM-DEXTROSE 2-4 GM/100ML-% IV SOLN
2.0000 g | Freq: Three times a day (TID) | INTRAVENOUS | Status: AC
  Administered 2022-08-14 (×2): 2 g via INTRAVENOUS
  Filled 2022-08-14 (×2): qty 100

## 2022-08-14 MED ORDER — 0.9 % SODIUM CHLORIDE (POUR BTL) OPTIME
TOPICAL | Status: DC | PRN
  Administered 2022-08-14: 1000 mL

## 2022-08-14 MED ORDER — SODIUM CHLORIDE 0.9% FLUSH: 3.0000 mL | INTRAVENOUS | Status: DC | PRN

## 2022-08-14 MED ORDER — BENZOYL PEROXIDE 10 % EX LIQD: 1.0000 | Freq: Every day | CUTANEOUS | Status: DC

## 2022-08-14 MED ORDER — ACETAMINOPHEN 325 MG PO TABS: 650.0000 mg | ORAL_TABLET | ORAL | Status: DC | PRN

## 2022-08-14 MED ORDER — POTASSIUM CHLORIDE IN NACL 20-0.9 MEQ/L-% IV SOLN: INTRAVENOUS | Status: DC

## 2022-08-14 MED ORDER — TAMSULOSIN HCL 0.4 MG PO CAPS
0.4000 mg | ORAL_CAPSULE | Freq: Two times a day (BID) | ORAL | Status: DC
  Administered 2022-08-14 – 2022-08-15 (×3): 0.4 mg via ORAL
  Filled 2022-08-14 (×3): qty 1

## 2022-08-14 MED ORDER — HYDROCHLOROTHIAZIDE 25 MG PO TABS
12.5000 mg | ORAL_TABLET | Freq: Every day | ORAL | Status: DC
  Administered 2022-08-14 – 2022-08-15 (×2): 12.5 mg via ORAL
  Filled 2022-08-14 (×2): qty 1

## 2022-08-14 MED ORDER — EPHEDRINE 5 MG/ML INJ
INTRAVENOUS | Status: AC
  Filled 2022-08-14: qty 5

## 2022-08-14 MED ORDER — FENTANYL CITRATE (PF) 100 MCG/2ML IJ SOLN
25.0000 ug | INTRAMUSCULAR | Status: DC | PRN
  Administered 2022-08-14 (×2): 50 ug via INTRAVENOUS

## 2022-08-14 MED ORDER — ACETAMINOPHEN 650 MG RE SUPP: 650.0000 mg | RECTAL | Status: DC | PRN

## 2022-08-14 MED ORDER — ORAL CARE MOUTH RINSE: 15.0000 mL | Freq: Once | OROMUCOSAL | Status: AC

## 2022-08-14 MED ORDER — DEXAMETHASONE SODIUM PHOSPHATE 10 MG/ML IJ SOLN
INTRAMUSCULAR | Status: AC
  Filled 2022-08-14: qty 1

## 2022-08-14 MED ORDER — CEFAZOLIN SODIUM-DEXTROSE 2-4 GM/100ML-% IV SOLN
2.0000 g | INTRAVENOUS | Status: AC
  Administered 2022-08-14: 2 g via INTRAVENOUS
  Filled 2022-08-14: qty 100

## 2022-08-14 MED ORDER — ROCURONIUM BROMIDE 10 MG/ML (PF) SYRINGE
PREFILLED_SYRINGE | INTRAVENOUS | Status: AC
  Filled 2022-08-14: qty 10

## 2022-08-14 MED ORDER — OXYBUTYNIN CHLORIDE 5 MG PO TABS
5.0000 mg | ORAL_TABLET | Freq: Every day | ORAL | Status: DC
  Administered 2022-08-14: 5 mg via ORAL
  Filled 2022-08-14: qty 1

## 2022-08-14 MED ORDER — DEXAMETHASONE SODIUM PHOSPHATE 10 MG/ML IJ SOLN
INTRAMUSCULAR | Status: DC | PRN
  Administered 2022-08-14: 10 mg via INTRAVENOUS

## 2022-08-14 MED ORDER — ROCURONIUM BROMIDE 10 MG/ML (PF) SYRINGE
PREFILLED_SYRINGE | INTRAVENOUS | Status: DC | PRN
  Administered 2022-08-14: 10 mg via INTRAVENOUS
  Administered 2022-08-14: 60 mg via INTRAVENOUS

## 2022-08-14 MED ORDER — DIPHENHYDRAMINE HCL 25 MG PO CAPS
50.0000 mg | ORAL_CAPSULE | Freq: Four times a day (QID) | ORAL | Status: DC | PRN
  Administered 2022-08-14 – 2022-08-15 (×2): 50 mg via ORAL
  Filled 2022-08-14 (×2): qty 2

## 2022-08-14 MED ORDER — KETAMINE HCL 50 MG/5ML IJ SOSY
PREFILLED_SYRINGE | INTRAMUSCULAR | Status: AC
  Filled 2022-08-14: qty 5

## 2022-08-14 MED ORDER — LIDOCAINE 2% (20 MG/ML) 5 ML SYRINGE
INTRAMUSCULAR | Status: DC | PRN
  Administered 2022-08-14: 80 mg via INTRAVENOUS

## 2022-08-14 MED ORDER — NORTRIPTYLINE HCL 25 MG PO CAPS
25.0000 mg | ORAL_CAPSULE | Freq: Every day | ORAL | Status: DC
  Administered 2022-08-14: 25 mg via ORAL
  Filled 2022-08-14: qty 1

## 2022-08-14 MED ORDER — FERROUS SULFATE 325 (65 FE) MG PO TABS
325.0000 mg | ORAL_TABLET | Freq: Every day | ORAL | Status: DC
  Administered 2022-08-14 – 2022-08-15 (×2): 325 mg via ORAL
  Filled 2022-08-14 (×2): qty 1

## 2022-08-14 MED ORDER — ACETAMINOPHEN 500 MG PO TABS
500.0000 mg | ORAL_TABLET | Freq: Once | ORAL | Status: AC
  Administered 2022-08-14: 500 mg via ORAL

## 2022-08-14 MED ORDER — SUFENTANIL CITRATE 50 MCG/ML IV SOLN
INTRAVENOUS | Status: DC | PRN
  Administered 2022-08-14 (×2): 10 ug via INTRAVENOUS

## 2022-08-14 MED ORDER — THROMBIN 5000 UNITS EX SOLR
CUTANEOUS | Status: AC
  Filled 2022-08-14: qty 5000

## 2022-08-14 MED ORDER — ONDANSETRON HCL 4 MG PO TABS: 4.0000 mg | ORAL_TABLET | Freq: Four times a day (QID) | ORAL | Status: DC | PRN

## 2022-08-14 MED ORDER — MIDAZOLAM HCL 2 MG/2ML IJ SOLN
INTRAMUSCULAR | Status: DC | PRN
  Administered 2022-08-14: 2 mg via INTRAVENOUS

## 2022-08-14 MED ORDER — TIZANIDINE HCL 4 MG PO TABS
4.0000 mg | ORAL_TABLET | Freq: Three times a day (TID) | ORAL | Status: DC | PRN
  Administered 2022-08-14 – 2022-08-15 (×2): 4 mg via ORAL
  Filled 2022-08-14 (×2): qty 1

## 2022-08-14 MED ORDER — TIMOLOL MALEATE 0.5 % OP SOLN
1.0000 [drp] | Freq: Every day | OPHTHALMIC | Status: DC
  Administered 2022-08-14 – 2022-08-15 (×2): 1 [drp] via OPHTHALMIC
  Filled 2022-08-14: qty 5

## 2022-08-14 MED ORDER — PROPOFOL 10 MG/ML IV BOLUS
INTRAVENOUS | Status: DC | PRN
  Administered 2022-08-14: 20 mg via INTRAVENOUS
  Administered 2022-08-14: 130 mg via INTRAVENOUS
  Administered 2022-08-14: 20 mg via INTRAVENOUS

## 2022-08-14 MED ORDER — SUGAMMADEX SODIUM 200 MG/2ML IV SOLN
INTRAVENOUS | Status: DC | PRN
  Administered 2022-08-14: 400 mg via INTRAVENOUS

## 2022-08-14 MED ORDER — CELECOXIB 200 MG PO CAPS
200.0000 mg | ORAL_CAPSULE | Freq: Two times a day (BID) | ORAL | Status: DC
  Administered 2022-08-14 – 2022-08-15 (×2): 200 mg via ORAL
  Filled 2022-08-14 (×2): qty 1

## 2022-08-14 MED ORDER — SODIUM CHLORIDE (PF) 0.9 % IJ SOLN
INTRAMUSCULAR | Status: AC
  Filled 2022-08-14: qty 10

## 2022-08-14 MED ORDER — ACETAMINOPHEN 500 MG PO TABS
1000.0000 mg | ORAL_TABLET | Freq: Once | ORAL | Status: DC
  Filled 2022-08-14: qty 2

## 2022-08-14 MED ORDER — ACETAMINOPHEN 500 MG PO TABS: 1000.0000 mg | ORAL_TABLET | ORAL | Status: DC

## 2022-08-14 MED ORDER — ASPIRIN 81 MG PO TBEC
81.0000 mg | DELAYED_RELEASE_TABLET | Freq: Every day | ORAL | Status: DC
  Administered 2022-08-14 – 2022-08-15 (×2): 81 mg via ORAL
  Filled 2022-08-14 (×2): qty 1

## 2022-08-14 MED ORDER — THROMBIN 5000 UNITS EX SOLR
OROMUCOSAL | Status: DC | PRN
  Administered 2022-08-14: 5 mL via TOPICAL

## 2022-08-14 MED ORDER — LIDOCAINE 2% (20 MG/ML) 5 ML SYRINGE
INTRAMUSCULAR | Status: AC
  Filled 2022-08-14: qty 5

## 2022-08-14 MED ORDER — GABAPENTIN 300 MG PO CAPS
300.0000 mg | ORAL_CAPSULE | ORAL | Status: DC
  Filled 2022-08-14: qty 1

## 2022-08-14 MED ORDER — ONDANSETRON HCL 4 MG/2ML IJ SOLN
INTRAMUSCULAR | Status: AC
  Filled 2022-08-14: qty 2

## 2022-08-14 MED ORDER — PHENYLEPHRINE 80 MCG/ML (10ML) SYRINGE FOR IV PUSH (FOR BLOOD PRESSURE SUPPORT)
PREFILLED_SYRINGE | INTRAVENOUS | Status: DC | PRN
  Administered 2022-08-14: 120 ug via INTRAVENOUS
  Administered 2022-08-14 (×4): 80 ug via INTRAVENOUS
  Administered 2022-08-14: 40 ug via INTRAVENOUS
  Administered 2022-08-14: 80 ug via INTRAVENOUS

## 2022-08-14 MED ORDER — SODIUM CHLORIDE 0.9% FLUSH: 3.0000 mL | Freq: Two times a day (BID) | INTRAVENOUS | Status: DC

## 2022-08-14 MED ORDER — PROPOFOL 10 MG/ML IV BOLUS
INTRAVENOUS | Status: AC
  Filled 2022-08-14: qty 20

## 2022-08-14 MED ORDER — ONDANSETRON HCL 4 MG/2ML IJ SOLN: 4.0000 mg | Freq: Four times a day (QID) | INTRAMUSCULAR | Status: DC | PRN

## 2022-08-14 MED ORDER — CHLORHEXIDINE GLUCONATE 0.12 % MT SOLN
15.0000 mL | Freq: Once | OROMUCOSAL | Status: AC
  Administered 2022-08-14: 15 mL via OROMUCOSAL
  Filled 2022-08-14: qty 15

## 2022-08-14 MED ORDER — PHENOL 1.4 % MT LIQD: 1.0000 | OROMUCOSAL | Status: DC | PRN

## 2022-08-14 MED ORDER — MENTHOL 3 MG MT LOZG: 1.0000 | LOZENGE | OROMUCOSAL | Status: DC | PRN

## 2022-08-14 SURGICAL SUPPLY — 68 items
ANCHOR LUMBAR 25 MIS (Anchor) IMPLANT
APPLIER CLIP 11 MED OPEN (CLIP) ×1
BAG COUNTER SPONGE SURGICOUNT (BAG) ×2 IMPLANT
BUR BARREL STRAIGHT FLUTE 4.0 (BURR) IMPLANT
BUR CARBIDE MATCH 3.0 (BURR) IMPLANT
CANISTER SUCT 3000ML PPV (MISCELLANEOUS) ×1 IMPLANT
CLIP APPLIE 11 MED OPEN (CLIP) ×1 IMPLANT
CLIP LIGATING EXTRA MED SLVR (CLIP) IMPLANT
DERMABOND ADVANCED .7 DNX12 (GAUZE/BANDAGES/DRESSINGS) IMPLANT
DRAPE C-ARM 42X72 X-RAY (DRAPES) ×3 IMPLANT
DRAPE LAPAROTOMY 100X72X124 (DRAPES) ×1 IMPLANT
DRSG OPSITE POSTOP 4X6 (GAUZE/BANDAGES/DRESSINGS) IMPLANT
DURAPREP 26ML APPLICATOR (WOUND CARE) ×1 IMPLANT
ELECT BLADE 4.0 EZ CLEAN MEGAD (MISCELLANEOUS) ×2
ELECT REM PT RETURN 9FT ADLT (ELECTROSURGICAL) ×1
ELECTRODE BLDE 4.0 EZ CLN MEGD (MISCELLANEOUS) ×2 IMPLANT
ELECTRODE REM PT RTRN 9FT ADLT (ELECTROSURGICAL) ×1 IMPLANT
GAUZE 4X4 16PLY ~~LOC~~+RFID DBL (SPONGE) IMPLANT
GAUZE SPONGE 4X4 12PLY STRL (GAUZE/BANDAGES/DRESSINGS) ×1 IMPLANT
GLOVE BIO SURGEON STRL SZ7.5 (GLOVE) ×1 IMPLANT
GLOVE BIO SURGEON STRL SZ8 (GLOVE) ×2 IMPLANT
GLOVE BIOGEL PI IND STRL 7.0 (GLOVE) IMPLANT
GLOVE BIOGEL PI IND STRL 8 (GLOVE) ×1 IMPLANT
GOWN STRL REUS W/ TWL LRG LVL3 (GOWN DISPOSABLE) IMPLANT
GOWN STRL REUS W/ TWL XL LVL3 (GOWN DISPOSABLE) ×1 IMPLANT
GOWN STRL REUS W/TWL 2XL LVL3 (GOWN DISPOSABLE) ×1 IMPLANT
GOWN STRL REUS W/TWL LRG LVL3 (GOWN DISPOSABLE)
GOWN STRL REUS W/TWL XL LVL3 (GOWN DISPOSABLE) ×1
GRAFT TRINITY ELITE LGE HUMAN (Tissue) IMPLANT
HEMOSTAT POWDER KIT SURGIFOAM (HEMOSTASIS) IMPLANT
INSERT FOGARTY 61MM (MISCELLANEOUS) IMPLANT
INSERT FOGARTY SM (MISCELLANEOUS) IMPLANT
KIT BASIN OR (CUSTOM PROCEDURE TRAY) ×1 IMPLANT
KIT TURNOVER KIT B (KITS) ×1 IMPLANT
NDL SPNL 18GX3.5 QUINCKE PK (NEEDLE) ×1 IMPLANT
NEEDLE SPNL 18GX3.5 QUINCKE PK (NEEDLE) ×1 IMPLANT
NS IRRIG 1000ML POUR BTL (IV SOLUTION) ×1 IMPLANT
PACK LAMINECTOMY NEURO (CUSTOM PROCEDURE TRAY) ×1 IMPLANT
PAD ARMBOARD 7.5X6 YLW CONV (MISCELLANEOUS) ×2 IMPLANT
PLATE ANT CITADEL 32 1L (Plate) IMPLANT
SCREW VA ST CIT 6.5X26 (Screw) IMPLANT
SPACER HEDRON IA 29X39X17 8D (Spacer) IMPLANT
SPONGE INTESTINAL PEANUT (DISPOSABLE) ×2 IMPLANT
SPONGE SURGIFOAM ABS GEL 100 (HEMOSTASIS) IMPLANT
SPONGE T-LAP 18X18 ~~LOC~~+RFID (SPONGE) ×2 IMPLANT
SUT PDS AB 1 CTX 36 (SUTURE) IMPLANT
SUT PROLENE 4 0 RB 1 (SUTURE)
SUT PROLENE 4-0 RB1 .5 CRCL 36 (SUTURE) IMPLANT
SUT PROLENE 5 0 CC1 (SUTURE) IMPLANT
SUT PROLENE 6 0 C 1 30 (SUTURE) IMPLANT
SUT PROLENE 6 0 CC (SUTURE) IMPLANT
SUT SILK 0 TIES 10X30 (SUTURE) IMPLANT
SUT SILK 2 0 TIES 10X30 (SUTURE) ×1 IMPLANT
SUT SILK 2 0SH CR/8 30 (SUTURE) IMPLANT
SUT SILK 3 0 TIES 17X18 (SUTURE)
SUT SILK 3 0SH CR/8 30 (SUTURE) IMPLANT
SUT SILK 3-0 18XBRD TIE BLK (SUTURE) IMPLANT
SUT VIC AB 0 CT1 18XCR BRD8 (SUTURE) ×1 IMPLANT
SUT VIC AB 0 CT1 27 (SUTURE)
SUT VIC AB 0 CT1 27XBRD ANBCTR (SUTURE) ×2 IMPLANT
SUT VIC AB 0 CT1 8-18 (SUTURE)
SUT VIC AB 2-0 CP2 18 (SUTURE) ×1 IMPLANT
SUT VIC AB 3-0 SH 8-18 (SUTURE) ×1 IMPLANT
SUT VICRYL 4-0 PS2 18IN ABS (SUTURE) ×1 IMPLANT
TOWEL GREEN STERILE (TOWEL DISPOSABLE) ×2 IMPLANT
TOWEL GREEN STERILE FF (TOWEL DISPOSABLE) ×3 IMPLANT
TRAY FOLEY MTR SLVR 16FR STAT (SET/KITS/TRAYS/PACK) ×1 IMPLANT
WATER STERILE IRR 1000ML POUR (IV SOLUTION) ×1 IMPLANT

## 2022-08-14 NOTE — Anesthesia Procedure Notes (Signed)
Arterial Line Insertion Start/End11/10/2021 8:00 AM, 08/14/2022 8:15 AM Performed by: Moshe Salisbury, CRNA, CRNA  Patient location: Pre-op. Preanesthetic checklist: patient identified, IV checked, site marked, risks and benefits discussed, surgical consent, monitors and equipment checked, pre-op evaluation, timeout performed and anesthesia consent Lidocaine 1% used for infiltration Left, radial was placed Catheter size: 20 G Hand hygiene performed , maximum sterile barriers used  and Seldinger technique used  Attempts: 2 Procedure performed without using ultrasound guided technique. Following insertion, dressing applied and Biopatch. Post procedure assessment: normal  Patient tolerated the procedure well with no immediate complications.

## 2022-08-14 NOTE — Anesthesia Postprocedure Evaluation (Signed)
Anesthesia Post Note  Patient: Mark Dunlap  Procedure(s) Performed: Anterior Lumbar Interbody Fusion - Lumbar Four-Lumbar Five with plate ABDOMINAL EXPOSURE     Patient location during evaluation: PACU Anesthesia Type: General Level of consciousness: awake and alert Pain management: pain level controlled Vital Signs Assessment: post-procedure vital signs reviewed and stable Respiratory status: spontaneous breathing, nonlabored ventilation, respiratory function stable and patient connected to nasal cannula oxygen Cardiovascular status: blood pressure returned to baseline and stable Postop Assessment: no apparent nausea or vomiting Anesthetic complications: no   No notable events documented.  Last Vitals:  Vitals:   08/14/22 1230 08/14/22 1259  BP: (!) 143/89 (!) 149/84  Pulse: 65 61  Resp: 12 20  Temp: 36.5 C 36.4 C  SpO2: 96% 99%    Last Pain:  Vitals:   08/14/22 1337  TempSrc:   PainSc: [place]

## 2022-08-14 NOTE — Progress Notes (Signed)
Patient ID: Mark Dunlap, male   DOB: 1961-04-09, 61 y.o.   MRN: [id number] He's doing well, expected pain in back and abdomen, incision ok, moving legs well, has urinated. Hopefully home in am

## 2022-08-14 NOTE — H&P (Signed)
History and Physical Interval Note:  08/14/2022 7:40 AM  Mark Dunlap  has presented today for surgery, with the diagnosis of Spondylolisthesis.  The various methods of treatment have been discussed with the patient and family. After consideration of risks, benefits and other options for treatment, the patient has consented to  Procedure(s): ALIF - L4-L5 with plate (N/A) ABDOMINAL EXPOSURE (N/A) as a surgical intervention.  The patient's history has been reviewed, patient examined, no change in status, stable for surgery.  I have reviewed the patient's chart and labs.  Questions were answered to the patient's satisfaction.    Anterior spine exposure for L4-L5 ALIF.  Risk and benefits again discussed.  Marty Heck    Signed           Patient name: Mark Dunlap         MRN: [id number]        DOB: [birth year]          Sex: male   REASON FOR CONSULT: Abdominal exposure for L4-L5 ALIF   HPI: Mark Dunlap is a 61 y.o. male, with history of hypertension, hyperlipidemia, CVA, prostate cancer, chronic back pain that presents for evaluation for abdominal exposure for L4-L5 ALIF.  Patient has chronic lower back pain with right leg radiculopathy.  He is previously undergone at least 5 spine surgeries including L4-L5 posterior instrumentation that has subsequently been removed in 2021 by Dr. Ronnald Ramp.  Dr. Ronnald Ramp has now recommended an L4-L5 ALIF.  Vascular surgery was asked to assist with anterior spine exposure.  He denies any previous abdominal surgery or hernia repairs or abdominal mesh.       Past Medical History:  Diagnosis Date   Chronic back pain      upper to lower   Depression     GERD (gastroesophageal reflux disease)     Glaucoma, both eyes     H/O: substance abuse ([place])      03-21-2021   pt stated hx of ETOH/Crack cocaine-none since 12/2012 per pt/Does not Drive due to this   History of CVA (cerebrovascular accident) without residual deficits 10/26/2018    right  BG/ CR infarct secondart to moderate SVD,  no residual   History of hyperthyroidism 2011    s/p RAI  06-20-2010,  followed by pcp   History of prostatitis 2010   HLD (hyperlipidemia)     Hypertension      followed by pcp    (nuclear stress test in epic 08-30-2013  normal no ischemia , ef 50%)   Legally blind in left eye, as defined in [place]      Blind Left eye, small amt vision Right eye   Lumbar radiculopathy     Lumbar radiculopathy     OA (osteoarthritis)     Prostate cancer [facility]) urologist--- dr Louis Meckel    dx 11-21-2020, Stage T1c, Gleason 4+3   Right thyroid nodule     Seasonal allergies     Spondylolisthesis, lumbar region     Spondylolisthesis, lumbar region     Unspecified fracture of unspecified lumbar vertebra, subsequent encounter for fracture with nonunion     Wears dentures      upper full and lower partial   Wears glasses             Past Surgical History:  Procedure Laterality Date   ANTERIOR CERVICAL DECOMP/DISCECTOMY FUSION   10/11/2011    Procedure: ANTERIOR CERVICAL DECOMPRESSION/DISCECTOMY FUSION 3 LEVELS;  Surgeon: Eustace Moore;  Location:  [facility];  Service: Neurosurgery;  Laterality: N/A;  Cervical three-four ,cervical four five cervical five six Anterior Cervical Decompression Fusion with peek + plate Nuvasive translational plate Orthofix peek (2 1/2 hours) Rm # [id number]   ANTERIOR CERVICAL DECOMP/DISCECTOMY FUSION N/A 07/29/2014    Procedure: ANTERIOR CERVICAL DECOMPRESSION/DISCECTOMY FUSION 1 LEVEL/HARDWARE REMOVAL;  Surgeon: Eustace Moore, MD;  Location: [facility];  Service: Neurosurgery;  Laterality: N/A;  cervical six-seven   COLONOSCOPY       COLONOSCOPY   lastone 07-12-2020   CYSTOSCOPY N/A 03/23/2021    Procedure: CYSTOSCOPY FLEXIBLE;  Surgeon: [name], MD;  Location: [facility];  Service: Urology;  Laterality: N/A;   HAND SURGERY Right x2  12/ 2007    I & D abscess   HARDWARE REMOVAL N/A 09/11/2020    Procedure: Removal of  hardware LUmbar Four-Lumbar Five;  Surgeon: Eustace Moore, MD;  Location: [facility];  Service: Neurosurgery;  Laterality: N/A;   LAMINECTOMY WITH POSTERIOR LATERAL ARTHRODESIS LEVEL 2 N/A 09/11/2020    Procedure: Laminectomy and Foraminotomy - Lumbar Three-Lumbar Four, posterior lateral fusion Lumbar Three-Four and Lumbar Four- Five;  Surgeon: Eustace Moore, MD;  Location: [facility];  Service: Neurosurgery;  Laterality: N/A;   POSTERIOR CERVICAL FUSION/FORAMINOTOMY N/A 09/23/2013    Procedure: CERVICALTWO TO CERVICAL SEVEN POSTERIOR CERVICAL FUSION/FORAMINOTOMY WITH LATERAL MASS FIXATION;  Surgeon: Eustace Moore, MD;  Location: [facility];  Service: Neurosurgery;  Laterality: N/A;   POSTERIOR LUMBAR FUSION   11-27-2017  '@[facility]'$     L4--5   RADIOACTIVE SEED IMPLANT N/A 03/23/2021    Procedure: RADIOACTIVE SEED IMPLANT/BRACHYTHERAPY IMPLANT;  Surgeon: [name], MD;  Location[facility];  Service: Urology;  Laterality: N/A;   SPACE OAR INSTILLATION N/A 03/23/2021    Procedure: SPACE OAR INSTILLATION;  Surgeon: [name], MD;  Location: [facility];  Service: Urology;  Laterality: N/A;   TOTAL HIP ARTHROPLASTY   12/17/2011    Procedure: TOTAL HIP ARTHROPLASTY;  Surgeon: Johnny Bridge, MD;  Location: [facility];  Service: Orthopedics;  Laterality: Left;           Family History  Problem Relation Age of Onset   Alcohol abuse Father     Stroke Father     Heart disease Father     Arthritis Mother     Hyperlipidemia Mother     Goiter Mother          resection of benign goiter   Hypertension Sister     Diabetes Sister     Goiter Sister          resection of benign goiter   Cancer Sister          oldest.type unknown.   Diabetes Brother     Cancer Brother          oldest.type unknown.   Alcohol abuse Brother     Alcohol abuse Cousin     Heart disease Other          Aunt   Colon cancer Neg Hx     Colon polyps Neg Hx     Esophageal cancer Neg Hx     Rectal cancer Neg  Hx     Stomach cancer Neg Hx     Breast cancer Neg Hx     Prostate cancer Neg Hx     Pancreatic cancer Neg Hx        SOCIAL HISTORY: Social History  Socioeconomic History   Marital status: Single      Spouse name: Not on file   Number of children: 0   Years of education: 61   Highest education level: 12th grade  Occupational History   Occupation: [employer]  Tobacco Use   Smoking status: Every Day      Packs/day: 0.75      Years: 47.00      Total pack years: 35.25      Types: Cigarettes   Smokeless tobacco: Never   Tobacco comments:      since age 37  Vaping Use   Vaping Use: Never used  Substance and Sexual Activity   Alcohol use: No      Alcohol/week: 0.0 standard drinks of alcohol      Comment: 03-21-2021 pt stated hx of alcohol abuse quit 2014   Drug use: Not Currently      Types: "Crack" cocaine      Comment: 03-21-2021 pt stated last use 12/2012   Sexual activity: Not Currently  Other Topics Concern   Not on file  Social History Narrative   Not on file    Social Determinants of Health        Financial Resource Strain: Low Risk  (07/02/2022)    Overall Financial Resource Strain (CARDIA)     Difficulty of Paying Living Expenses: Not hard at all  Food Insecurity: No Food Insecurity (07/02/2022)    Hunger Vital Sign     Worried About Running Out of Food in the Last Year: Never true     [place] in the Last Year: Never true  Transportation Needs: No Transportation Needs (07/02/2022)    PRAPARE - Armed forces logistics/support/administrative officer (Medical): No     Lack of Transportation (Non-Medical): No  Physical Activity: Inactive (07/02/2022)    Exercise Vital Sign     Days of Exercise per Week: 0 days     Minutes of Exercise per Session: 0 min  Stress: No Stress Concern Present (07/02/2022)    [place]     Feeling of Stress : Not at all  Social Connections: [place] (07/02/2022)    Social Connection and Isolation Panel [NHANES]     Frequency of Communication with Friends and Family: More than three times a week     Frequency of Social Gatherings with Friends and Family: More than three times a week     Attends Religious Services: More than 4 times per year     Active Member of [employer] or Organizations: Yes     Attends Music therapist: More than 4 times per year     Marital Status: Married  Human resources officer Violence: Not At Risk (07/02/2022)    Humiliation, Afraid, Rape, and Kick questionnaire     Fear of Current or Ex-Partner: No     Emotionally Abused: No     Physically Abused: No     Sexually Abused: No          Allergies  Allergen Reactions   Tramadol Itching   Morphine And Related Itching   Oxycodone Itching            Current Outpatient Medications  Medication Sig Dispense Refill   adapalene (DIFFERIN) 0.1 % gel Apply 1 Application topically at bedtime. 45 g 1   aspirin EC 81 MG tablet Take 81 mg by mouth daily. Swallow whole.  atorvastatin (LIPITOR) 40 MG tablet Take 1 tablet (40 mg total) by mouth daily. 90 tablet 3   bimatoprost (LUMIGAN) 0.01 % SOLN Place 1 drop into both eyes at bedtime.       citalopram (CELEXA) 20 MG tablet Take 1 tablet (20 mg total) by mouth daily. 90 tablet 3   clopidogrel (PLAVIX) 75 MG tablet Take 1 tablet (75 mg total) by mouth daily. 90 tablet 3   diazepam (VALIUM) 10 MG tablet Take 10 mg by mouth as needed. (Patient not taking: Reported on 06/19/2022)       diphenhydrAMINE (BENADRYL) 25 mg capsule Take 50 mg by mouth every 6 (six) hours as needed for itching.        doxycycline (VIBRA-TABS) 100 MG tablet Take 1 tablet (100 mg total) by mouth 2 (two) times daily. 20 tablet 0   famotidine (PEPCID) 40 MG tablet Take 1 tablet (40 mg total) by mouth 2 (two) times daily. Take every morning and at bedtime 180 tablet 3   fluticasone (FLONASE) 50 MCG/ACT nasal spray USE 2 SPRAYS DAILY IN  BOTH NOSTRILS 32 mL 0   gabapentin (NEURONTIN) 100 MG capsule Take 200 mg by mouth 2 (two) times daily.   2   hydrochlorothiazide (MICROZIDE) 12.5 MG capsule TAKE 1 CAPSULE BY MOUTH EVERY DAY 90 capsule 3   iron polysaccharides (NU-IRON) 150 MG capsule Take 1 capsule (150 mg total) by mouth daily. 90 capsule 3   lidocaine (XYLOCAINE) 2 % solution Use as directed 15 mLs in the mouth or throat daily as needed (acid reflux). (Patient not taking: Reported on 06/19/2022) 100 mL 2   Multiple Vitamins-Minerals (ONE DAILY FOR MEN/LYCOPENE) TABS Take 1 tablet by mouth daily.       naloxone (NARCAN) nasal spray 4 mg/0.1 mL Place 1 spray into the nose daily as needed. (Patient not taking: Reported on 06/19/2022)       nortriptyline (PAMELOR) 25 MG capsule Take 25 mg by mouth at bedtime.   0   oxybutynin (DITROPAN) 5 MG tablet Take 1 tablet (5 mg total) by mouth at bedtime. 90 tablet 3   oxyCODONE-acetaminophen (PERCOCET) 10-325 MG tablet Take 1 tablet by mouth every 6 (six) hours as needed.       pantoprazole (PROTONIX) 40 MG tablet Take 1 tablet (40 mg total) by mouth daily. 90 tablet 3   sildenafil (VIAGRA) 100 MG tablet Take 100 mg by mouth daily.       tamsulosin (FLOMAX) 0.4 MG CAPS capsule Take 1 capsule (0.4 mg total) by mouth 2 (two) times daily. 180 capsule 3   timolol (TIMOPTIC) 0.5 % ophthalmic solution Place 1 drop into both eyes daily.   6   tiZANidine (ZANAFLEX) 4 MG tablet Take 4 mg by mouth every 8 (eight) hours as needed for muscle spasms.        tretinoin (RETIN-A) 0.1 % cream Apply 1 application  topically at bedtime. 45 g 2   zolpidem (AMBIEN) 10 MG tablet Take 1 tablet (10 mg total) by mouth at bedtime as needed. for sleep 90 tablet 1    No current facility-administered medications for this visit.      REVIEW OF SYSTEMS:  '[X]'$  denotes positive finding, '[ ]'$  denotes negative finding Cardiac   Comments:  Chest pain or chest pressure:      Shortness of breath upon exertion:      Short of  breath when lying flat:      Irregular heart rhythm:  Vascular      Pain in calf, thigh, or hip brought on by ambulation: x    Pain in feet at night that wakes you up from your sleep:       Blood clot in your veins:      Leg swelling:              Pulmonary      Oxygen at home:      Productive cough:       Wheezing:              Neurologic      Sudden weakness in arms or legs:       Sudden numbness in arms or legs:       Sudden onset of difficulty speaking or slurred speech:      Temporary loss of vision in one eye:       Problems with dizziness:              Gastrointestinal      Blood in stool:       Vomited blood:              Genitourinary      Burning when urinating:       Blood in urine:             Psychiatric      Major depression:              Hematologic      Bleeding problems:      Problems with blood clotting too easily:             Skin      Rashes or ulcers:             Constitutional      Fever or chills:          PHYSICAL EXAM: There were no vitals filed for this visit.   GENERAL: The patient is a well-nourished male, in no acute distress. The vital signs are documented above. CARDIAC: There is a regular rate and rhythm.  VASCULAR:  Palpable femoral pulses bilaterally Palpable DP pulses bilaterally PULMONARY: No respiratory distress ABDOMEN: Soft and non-tender.  No previous abdominal incisions. MUSCULOSKELETAL: There are no major deformities or cyanosis. NEUROLOGIC: No focal weakness or paresthesias are detected. SKIN: There are no ulcers or rashes noted. PSYCHIATRIC: The patient has a normal affect.   DATA:    CT Lumbar Spine 06/07/22        Assessment/Plan:   61 y.o. male, with history of hypertension, hyperlipidemia, CVA, prostate cancer, chronic back pain that presents for evaluation for abdominal exposure for L4-L5 ALIF.  Patient has chronic lower back pain with right leg radiculopathy.  I reviewed his CT lumbar spine  and discussed he would be a good candidate for anterior approach.  Discussed paramedian incision over the left rectus muscle with mobilization of the rectus muscle and then entering the retroperitoneum by mobilizing the peritoneum and left ureter to the midline.  Discussed mobilizing left iliac artery and vein to expose the disc space from the front.  Discussed risk of injury to the above structures.  Answered all of his questions.  Look forward to assisting Dr. Ronnald Ramp for L4-L5 ALIF.  Patient is scheduled on 08/14/2022.     Marty Heck, MD [facility] Office: [phone]

## 2022-08-14 NOTE — Transfer of Care (Signed)
Immediate Anesthesia Transfer of Care Note  Patient: Mark Dunlap  Procedure(s) Performed: Anterior Lumbar Interbody Fusion - Lumbar Four-Lumbar Five with plate ABDOMINAL EXPOSURE  Patient Location: PACU  Anesthesia Type:General  Level of Consciousness: drowsy and patient cooperative  Airway & Oxygen Therapy: Patient Spontanous Breathing and Patient connected to nasal cannula oxygen  Post-op Assessment: Report given to RN and Post -op Vital signs reviewed and stable  Post vital signs: Reviewed and stable  Last Vitals:  Vitals Value Taken Time  BP 151/83 08/14/22 1122  Temp    Pulse 82 08/14/22 1123  Resp 14 08/14/22 1123  SpO2 100 % 08/14/22 1123  Vitals shown include unvalidated device data.  Last Pain:  Vitals:   08/14/22 0749  TempSrc:   PainSc: 0-No pain         Complications: No notable events documented.

## 2022-08-14 NOTE — Op Note (Signed)
08/14/2022  11:10 AM  PATIENT:  Mark Dunlap  61 y.o. 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, spinal stenosis L4-5, back pain with leg pain  POST-OPERATIVE DIAGNOSIS:  same  PROCEDURE: Anterior lumbar interbody fusion L4-5 utilizing a Globus titanium interbody cage packed with morselized allograft, anterior lumbar plating L4-5 utilizing a globus anterior lumbar plate  SURGEON:  Sherley Bounds, MD  Co-surgeon: Monica Martinez, MD  ASSISTANTS: Glenford Peers FNP  ANESTHESIA:   General  EBL: 30 ml  Total I/O In: 1000 [I.V.:1000] Out: 530 [Urine:500; Blood:30]  BLOOD ADMINISTERED: none  DRAINS: None  SPECIMEN:  none  INDICATION FOR PROCEDURE: This patient presented with severe back pain with leg pain. Imaging showed dynamic spondylolisthesis L4-5 with spinal stenosis and pseudoarthrosis. The patient tried conservative measures without relief. Pain was debilitating. Recommended anterior lumbar interbody fusion L4-5. Patient understood the risks, benefits, and alternatives and potential outcomes and wished to proceed.  PROCEDURE DETAILS: The patient was taken the operating room and placed in the supine position operative table.  After induction of generalized endotracheal anesthesia, the exposure was performed Dr. Monica Martinez of vascular surgery and this has been dictated in a separate operative report.  Once the exposure was completed, we placed a needle in the L4-5 disc base and checked AP and lateral fluoroscopy to confirm our level and the midline.  The midline was marked with Bovie cautery, the annulus was incised and the discectomy was performed with pituitary rongeurs.  I then released the disc from the endplates with a Cobb elevator and completed the discectomy.  Then prepared the endplates with a Cobb curette and with rasps.  Used a high-speed drill to also prepare the endplates for arthrodesis.  Cleaned out the corners.  Then  used sequential trials until the 17 mm 8 degree large trial fit the best.  Checked this with lateral fluoroscopy.  We then packed a corresponding cage with morselized allograft and then inserted the cage into the disc space utilizing an inserter and lateral fluoroscopy.  Once the cage was in good position we placed anchors into the vertebral bodies.  An anchor was placed up and L4 and 2 anchors were placed into L5.  We locked the anchors into place.  We then wanted to use an anterior lumbar plate.  We used a 32 mm globus plate.  We used the 22 mm awl to create holes for the screws.  We placed 26 mm screws through the 232 mm plate into the vertebral bodies of L4 and L5 and then locked these in position with the locking mechanism on the plate.  Then checked our construct with AP and lateral fluoroscopy.  We thought it looks good.  Therefore we removed the retractor and found no significant bleeding in the retroperitoneal space.  We got an AP x-ray to make sure there are no retained instruments or sponges.  This was read by the radiologist.  We then closed the rectus sheath with a running 0 PDS.  We then closed the subcutaneous tissues with 2-0 Vicryl in the subcuticular tissue with 3-0 Vicryl.  Skin was closed with Dermabond.  The patient was then awakened from general anesthesia and transported to the recovery room in stable condition.  At the end of the procedure all sponge needle and instrument counts were correct.   PLAN OF CARE: Admit to inpatient   PATIENT DISPOSITION:  PACU - hemodynamically stable.   Delay start of Pharmacological VTE agent (>24hrs) due to surgical  blood loss or risk of bleeding:  yes

## 2022-08-14 NOTE — H&P (Signed)
Subjective: Patient is a 61 y.o. male admitted for pseudoarthrosis with spondylolisthesis l4-5. Onset of symptoms was several months ago, gradually worsening since that time.  The pain is rated severe, and is located at the across the lower back and radiates to legs. The pain is described as aching and occurs all day. The symptoms have been progressive. Symptoms are exacerbated by exercise and standing. MRI or CT showed non-union and spondylolisthesis L4-5   Past Medical History:  Diagnosis Date   Aortic atherosclerosis (HCC)    Chronic back pain    upper to lower   Depression    GERD (gastroesophageal reflux disease)    Glaucoma, both eyes    H/O: substance abuse ([place])    03-21-2021   pt stated hx of ETOH/Crack cocaine-none since 12/2012 per pt/Does not Drive due to this   History of CVA (cerebrovascular accident) without residual deficits 10/26/2018   right BG/ CR infarct secondart to moderate SVD,  no residual   History of hyperthyroidism 2011   s/p RAI  06-20-2010,  followed by pcp   History of prostatitis 2010   HLD (hyperlipidemia)    Hypertension    followed by pcp    (nuclear stress test in epic 08-30-2013  normal no ischemia , ef 50%)   Legally blind in left eye, as defined in [place]    Blind Left eye, small amt vision Right eye   Lumbar radiculopathy    Lumbar radiculopathy    OA (osteoarthritis)    Prostate cancer [facility]) urologist--- dr Louis Meckel   dx 11-21-2020, Stage T1c, Gleason 4+3   Right thyroid nodule    Seasonal allergies    Spondylolisthesis, lumbar region    Spondylolisthesis, lumbar region    Unspecified fracture of unspecified lumbar vertebra, subsequent encounter for fracture with nonunion    Wears dentures    upper full and lower partial   Wears glasses     Past Surgical History:  Procedure Laterality Date   ANTERIOR CERVICAL DECOMP/DISCECTOMY FUSION  10/11/2011   Procedure: ANTERIOR CERVICAL DECOMPRESSION/DISCECTOMY FUSION 3 LEVELS;  Surgeon: Eustace Moore;   Location: [place] [facility];  Service: Neurosurgery;  Laterality: N/A;  Cervical three-four ,cervical four five cervical five six Anterior Cervical Decompression Fusion with peek + plate Nuvasive translational plate Orthofix peek (2 1/2 hours) Rm # [id number]   ANTERIOR CERVICAL DECOMP/DISCECTOMY FUSION N/A 07/29/2014   Procedure: ANTERIOR CERVICAL DECOMPRESSION/DISCECTOMY FUSION 1 LEVEL/HARDWARE REMOVAL;  Surgeon: Eustace Moore, MD;  Location: [place] [facility];  Service: Neurosurgery;  Laterality: N/A;  cervical six-seven   COLONOSCOPY     COLONOSCOPY  lastone 07-12-2020   CYSTOSCOPY N/A 03/23/2021   Procedure: CYSTOSCOPY FLEXIBLE;  Surgeon: [name], MD;  Location: [facility];  Service: Urology;  Laterality: N/A;   HAND SURGERY Right x2  12/ 2007   I & D abscess   HARDWARE REMOVAL N/A 09/11/2020   Procedure: Removal of hardware LUmbar Four-Lumbar Five;  Surgeon: Eustace Moore, MD;  Location: [place];  Service: Neurosurgery;  Laterality: N/A;   LAMINECTOMY WITH POSTERIOR LATERAL ARTHRODESIS LEVEL 2 N/A 09/11/2020   Procedure: Laminectomy and Foraminotomy - Lumbar Three-Lumbar Four, posterior lateral fusion Lumbar Three-Four and Lumbar Four- Five;  Surgeon: Eustace Moore, MD;  Location: [place];  Service: Neurosurgery;  Laterality: N/A;   POSTERIOR CERVICAL FUSION/FORAMINOTOMY N/A 09/23/2013   Procedure: CERVICALTWO TO CERVICAL SEVEN POSTERIOR CERVICAL FUSION/FORAMINOTOMY WITH LATERAL MASS FIXATION;  Surgeon: Eustace Moore, MD;  Location: [facility];  Service: Neurosurgery;  Laterality: N/A;   POSTERIOR LUMBAR FUSION  11-27-2017  '@[facility]'$    L4--5   RADIOACTIVE SEED IMPLANT N/A 03/23/2021   Procedure: RADIOACTIVE SEED IMPLANT/BRACHYTHERAPY IMPLANT;  Surgeon: [name], MD;  Location: [facility];  Service: Urology;  Laterality: N/A;   SPACE OAR INSTILLATION N/A 03/23/2021   Procedure: SPACE OAR INSTILLATION;  Surgeon: [name], MD;  Location: [facility];  Service:  Urology;  Laterality: N/A;   TOTAL HIP ARTHROPLASTY  12/17/2011   Procedure: TOTAL HIP ARTHROPLASTY;  Surgeon: Johnny Bridge, MD;  Location: [place];  Service: Orthopedics;  Laterality: Left;    Prior to Admission medications   Medication Sig Start Date End Date Taking? Authorizing Provider  adapalene (DIFFERIN) 0.1 % gel Apply 1 Application topically at bedtime. Patient taking differently: Apply 1 Application topically See admin instructions. Use 1 application at night for 3 nights, then switch to tretinoin cream for 3 nights then repeat 06/06/22  Yes Biagio Borg, MD  aspirin EC 81 MG tablet Take 81 mg by mouth daily. Swallow whole.   Yes [provider]  atorvastatin (LIPITOR) 40 MG tablet Take 1 tablet (40 mg total) by mouth daily. Patient taking differently: Take 40 mg by mouth daily at 6 PM. 06/06/22  Yes Biagio Borg, MD  benzoyl peroxide 10 % LIQD Apply 1 Application topically daily.   Yes [provider]  bimatoprost (LUMIGAN) 0.01 % SOLN Place 1 drop into both eyes at bedtime.   Yes [provider]  citalopram (CELEXA) 20 MG tablet Take 1 tablet (20 mg total) by mouth daily. 06/06/22  Yes Biagio Borg, MD  clopidogrel (PLAVIX) 75 MG tablet Take 1 tablet (75 mg total) by mouth daily. 06/06/22  Yes Biagio Borg, MD  diphenhydrAMINE (BENADRYL) 25 mg capsule Take 50 mg by mouth every 6 (six) hours as needed for itching.    Yes [provider]  famotidine (PEPCID) 40 MG tablet Take 1 tablet (40 mg total) by mouth 2 (two) times daily. Take every morning and at bedtime 06/06/22  Yes Biagio Borg, MD  fluticasone [facility]) 50 MCG/ACT nasal spray USE 2 SPRAYS DAILY IN BOTH NOSTRILS Patient taking differently: Place 2 sprays into both nostrils daily as needed for allergies. 12/11/21  Yes Biagio Borg, MD  gabapentin (NEURONTIN) 100 MG capsule Take 200 mg by mouth 2 (two) times daily. 04/13/18  Yes [provider]  hydrochlorothiazide (MICROZIDE) 12.5 MG  capsule TAKE 1 CAPSULE BY MOUTH EVERY DAY 06/06/22  Yes Biagio Borg, MD  ibuprofen (ADVIL) 200 MG tablet Take 400 mg by mouth every 6 (six) hours as needed for headache or moderate pain.   Yes [provider]  iron polysaccharides (NU-IRON) 150 MG capsule Take 1 capsule (150 mg total) by mouth daily. 03/04/22  Yes Biagio Borg, MD  Multiple Vitamins-Minerals (ONE DAILY FOR MEN/LYCOPENE) TABS Take 1 tablet by mouth daily.   Yes [provider]  naloxone (NARCAN) nasal spray 4 mg/0.1 mL Place 1 spray into the nose as needed (opioid overdose). 12/17/21  Yes [provider]  nortriptyline (PAMELOR) 25 MG capsule Take 25 mg by mouth at bedtime. 10/29/17  Yes [provider]  omeprazole (PRILOSEC OTC) 20 MG tablet Take 20 mg by mouth daily.   Yes [provider]  oxybutynin (DITROPAN) 5 MG tablet Take 1 tablet (5 mg total) by mouth at bedtime. 06/06/22  Yes Biagio Borg, MD  oxyCODONE-acetaminophen (  PERCOCET) 10-325 MG tablet Take 1 tablet by mouth every 6 (six) hours as needed for pain. 03/18/22  Yes [provider]  pantoprazole (PROTONIX) 40 MG tablet Take 1 tablet (40 mg total) by mouth daily. 06/06/22  Yes Biagio Borg, MD  tamsulosin (FLOMAX) 0.4 MG CAPS capsule Take 1 capsule (0.4 mg total) by mouth 2 (two) times daily. 06/06/22  Yes Biagio Borg, MD  timolol (TIMOPTIC) 0.5 % ophthalmic solution Place 1 drop into both eyes daily. 10/28/17  Yes [provider]  tiZANidine (ZANAFLEX) 4 MG tablet Take 4 mg by mouth every 8 (eight) hours as needed for muscle spasms.  06/23/19  Yes [provider]  tretinoin (RETIN-A) 0.1 % cream Apply 1 application  topically at bedtime. Patient taking differently: Apply 1 application  topically See admin instructions. Use 1 application at night for 3 nights, then switch to tretinoin cream for 3 nights then repeat 06/06/22  Yes Biagio Borg, MD  zolpidem (AMBIEN) 10 MG tablet Take 1 tablet (10 mg total) by  mouth at bedtime as needed. for sleep 06/06/22  Yes Biagio Borg, MD  doxycycline (VIBRA-TABS) 100 MG tablet Take 1 tablet (100 mg total) by mouth 2 (two) times daily. Patient not taking: Reported on 07/23/2022 06/19/22   Biagio Borg, MD  sildenafil (VIAGRA) 100 MG tablet Take 100 mg by mouth as needed for erectile dysfunction. 11/28/20   [provider]   Allergies  Allergen Reactions   Tramadol Itching   Morphine And Related Itching   Oxycodone Itching    Tolerates with benadryl     Social History   Tobacco Use   Smoking status: Every Day    Packs/day: 1.00    Years: 47.00    Total pack years: 47.00    Types: Cigarettes   Smokeless tobacco: Never   Tobacco comments:    since age 26  Substance Use Topics   Alcohol use: No    Alcohol/week: 0.0 standard drinks of alcohol    Comment: 03-21-2021 pt stated hx of alcohol abuse quit 2014    Family History  Problem Relation Age of Onset   Alcohol abuse Father    Stroke Father    Heart disease Father    Arthritis Mother    Hyperlipidemia Mother    Goiter Mother        resection of benign goiter   Hypertension Sister    Diabetes Sister    Goiter Sister        resection of benign goiter   Cancer Sister        oldest.type unknown.   Diabetes Brother    Cancer Brother        oldest.type unknown.   Alcohol abuse Brother    Alcohol abuse Cousin    Heart disease Other        Aunt   Colon cancer Neg Hx    Colon polyps Neg Hx    Esophageal cancer Neg Hx    Rectal cancer Neg Hx    Stomach cancer Neg Hx    Breast cancer Neg Hx    Prostate cancer Neg Hx    Pancreatic cancer Neg Hx      Review of Systems  Positive ROS: neg  All other systems have been reviewed and were otherwise negative with the exception of those mentioned in the HPI and as above.  Objective: Vital signs in last 24 hours: Temp:  [98.7 F (37.1 C)] 98.7 F (37.1 C) (  11/01 0702) Pulse Rate:  [62] 62 (11/01 0702) Resp:  [17] 17 (11/01  0702) BP: (157)/(91) 157/91 (11/01 0702) SpO2:  [99 %] 99 % (11/01 0702) Weight:  [73.9 kg] 73.9 kg (11/01 0702)  General Appearance: Alert, cooperative, no distress, appears stated age Head: Normocephalic, without obvious abnormality, atraumatic Eyes: PERRL, conjunctiva/corneas clear, EOM's intact    Neck: Supple, symmetrical, trachea midline Back: Symmetric, no curvature, ROM normal, no CVA tenderness Lungs:  respirations unlabored Heart: Regular rate and rhythm Abdomen: Soft, non-tender Extremities: Extremities normal, atraumatic, no cyanosis or edema Pulses: 2+ and symmetric all extremities Skin: Skin color, texture, turgor normal, no rashes or lesions  NEUROLOGIC:   Mental status: Alert and oriented x4,  no aphasia, good attention span, fund of knowledge, and memory Motor Exam - grossly normal Sensory Exam - grossly normal Reflexes: 1= Coordination - grossly normal Gait - grossly normal Balance - grossly normal Cranial Nerves: I: smell Not tested  II: visual acuity  OS: nl    OD: nl  II: visual fields Full to confrontation  II: pupils Equal, round, reactive to light  III,VII: ptosis None  III,IV,VI: extraocular muscles  Full ROM  V: mastication Normal  V: facial light touch sensation  Normal  V,VII: corneal reflex  Present  VII: facial muscle function - upper  Normal  VII: facial muscle function - lower Normal  VIII: hearing Not tested  IX: soft palate elevation  Normal  IX,X: gag reflex Present  XI: trapezius strength  5/5  XI: sternocleidomastoid strength 5/5  XI: neck flexion strength  5/5  XII: tongue strength  Normal    Data Review Lab Results  Component Value Date   WBC 9.0 08/02/2022   HGB 10.7 (L) 08/02/2022   HCT 35.4 (L) 08/02/2022   MCV 77.8 (L) 08/02/2022   PLT 408 (H) 08/02/2022   Lab Results  Component Value Date   NA 138 08/02/2022   K 3.6 08/02/2022   CL 98 08/02/2022   CO2 29 08/02/2022   BUN 8 08/02/2022   CREATININE 1.07  08/02/2022   GLUCOSE 101 (H) 08/02/2022   Lab Results  Component Value Date   INR 1.0 08/02/2022    Assessment/Plan:  Estimated body mass index is 22.11 kg/m as calculated from the following:   Height as of this encounter: 6' (1.829 m).   Weight as of this encounter: 73.9 kg. Patient admitted for ALIF L4-5. Patient has failed a reasonable attempt at conservative therapy.  I explained the condition and procedure to the patient and answered any questions.  Patient wishes to proceed with procedure as planned. Understands risks/ benefits and typical outcomes of procedure.   Eustace Moore 08/14/2022 8:24 AM

## 2022-08-14 NOTE — Anesthesia Procedure Notes (Signed)
Procedure Name: Intubation Date/Time: 08/14/2022 8:46 AM  Performed by: Moshe Salisbury, CRNAPre-anesthesia Checklist: Patient identified, Emergency Drugs available, Suction available and Patient being monitored Patient Re-evaluated:Patient Re-evaluated prior to induction Oxygen Delivery Method: Circle System Utilized Preoxygenation: Pre-oxygenation with 100% oxygen Induction Type: IV induction Ventilation: Mask ventilation without difficulty Laryngoscope Size: Mac and 4 Grade View: Grade III Tube type: Oral Tube size: 8.0 mm Number of attempts: 1 Airway Equipment and Method: Stylet Placement Confirmation: ETT inserted through vocal cords under direct vision, positive ETCO2 and breath sounds checked- equal and bilateral Secured at: 23 cm Tube secured with: Tape Dental Injury: Teeth and Oropharynx as per pre-operative assessment

## 2022-08-14 NOTE — Op Note (Signed)
Date: August 14, 2022  Preoperative diagnosis: Chronic lower back pain  Postoperative diagnosis: Same  Procedure: Anterior spine exposure of the L4-L5 disc space via anterior retroperitoneal approach for L4-L5 ALIF  Surgeon: Dr. Marty Heck, MD  Co-surgeon: Dr. Sherley Bounds, MD  Indications: 61 year old male with chronic lower back pain that has had multiple previous instrumentations from L2-L5.  He now has recurrent back pain and was evaluated by Dr. Ronnald Ramp with neurosurgery who recommended an L4-L5 ALIF.  Vascular surgery was asked to assist with anterior spine exposure.  He presents today after risks benefits discussed.  Findings: L4-L5 disc space was marked over the left rectus muscle with a fluoroscopic C arm in the lateral position.  Paramedian incision was made over the left rectus muscle at the L4-L5 disc space.  The anterior rectus sheath was opened and we mobilized the left rectus muscle to the midline and then opened the posterior rectus sheath above the arcuate line.  We entered into the retroperitoneum and mobilized peritoneum and left ureter across midline.  The iliac artery and vein were mobilized to the midline after 2 iliolumbar branches were ligated between 2-0 silk ties clips and divided off the left iliac vein.  Ultimately fixed NuVasive retractor was placed to expose the disc space.  We confirmed we were at the correct level at the L4-L5 disc space on lateral fluoroscopy with a spinal needle in the disc space.  Anesthesia: General  Details: Patient was taken to the operating room after informed consent was obtained.  Placed on the operative table in supine position.  Fluoroscopic C-arm was then used in the lateral position to mark the L4-L5 disc space over the left rectus muscle.  The abdominal wall was then prepped and draped in standard sterile fashion.  Antibiotics were given.  Timeout performed.  Initially made a paramedian incision over the left rectus muscle where  the disc level was marked.  Subcutaneous tissue was opened with Bovie cautery and the anterior rectus sheath was opened longitudinally as well with Bovie cautery.  The left rectus muscle was mobilized to the midline and I entered into the retroperitoneum below the arcuate line and the peritoneum and left ureter were mobilized to the midline.  Peritoneum was dissected off of the posterior rectus sheath above arcuate line and this was opened with Metzenbaum scissors above the level of the L4-5 disc space.  I then placed a wet lap pad in the wound with a B[place] and Dr. Ronnald Ramp used hand-held Wiley retractors to pull the peritoneum and left ureter to the midline.  I continued to mobilize the left iliac artery and vein toward the midline.  Identified 2 iliolumbar branches off the left iliac vein that were ligated between 2-0 silk ties clips and divided.  I fully mobilized the left iliac artery and vein all the way to the contralateral side of the disc space.  Once we had good working room a fixed NuVasive retractor was placed.  I placed 120 reverse lip retractors on each side of the disc space as well as a 120 reverse lip retractor cranial and caudal.  I put a spinal needle in the disc space.  We confirmed on lateral fluoroscopy we were at the correct level at L4-L5.  Case was turned over to Dr. Ronnald Ramp.  Complication: None  Condition: Stable  Marty Heck, MD [facility] Office: [place]

## 2022-08-14 NOTE — Progress Notes (Signed)
According to patient, he passed gas while this nurse is in the room. [employer] went to patient's room and asked him as well, then he confirmed. Patient's diet was changed then to regular. Will continue to monitor.

## 2022-08-15 MED ORDER — OXYCODONE-ACETAMINOPHEN 10-325 MG PO TABS: 1.0000 | ORAL_TABLET | ORAL | 0 refills | Status: AC | PRN

## 2022-08-15 NOTE — Discharge Summary (Signed)
Physician Discharge Summary  Patient ID: Mark Dunlap MRN: [id number] DOB/AGE: 1961/07/11 61 y.o.  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 The patient tolerated the procedure well and was taken to the recovery room and then to the floor in stable condition. The hospital course was routine. There were no complications. The wound remained clean dry and intact. Pt had appropriate back soreness. No complaints of leg pain or new N/T/W. The patient remained afebrile with stable vital signs, and tolerated a regular diet. The patient continued to increase activities, and pain was well controlled with oral pain medications.   Consults: None  Significant Diagnostic Studies:  Results for orders placed or performed during the hospital encounter of 08/02/22  Surgical pcr screen   Specimen: Nasal Mucosa; Nasal Swab  Result Value Ref Range   MRSA, PCR NEGATIVE NEGATIVE   Staphylococcus aureus NEGATIVE NEGATIVE  Protime-INR  Result Value Ref Range   Prothrombin Time 12.9 11.4 - 15.2 seconds   INR 1.0 0.8 - 1.2  Basic metabolic panel per protocol  Result Value Ref Range   Sodium 138 135 - 145 mmol/L   Potassium 3.6 3.5 - 5.1 mmol/L   Chloride 98 98 - 111 mmol/L   CO2 29 22 - 32 mmol/L   Glucose, Bld 101 (H) 70 - 99 mg/dL   BUN 8 8 - 23 mg/dL   Creatinine, Ser 1.07 0.61 - 1.24 mg/dL   Calcium 10.0 8.9 - 10.3 mg/dL   GFR, Estimated >60 >60 mL/min   Anion gap 11 5 - 15  CBC per protocol  Result Value Ref Range   WBC 9.0 4.0 - 10.5 K/uL   RBC 4.55 4.22 - 5.81 MIL/uL   Hemoglobin 10.7 (L) 13.0 - 17.0 g/dL   HCT 35.4 (L) 39.0 - 52.0 %   MCV 77.8 (L) 80.0 - 100.0 fL   MCH 23.5 (L) 26.0 - 34.0 pg   MCHC 30.2 30.0 - 36.0 g/dL   RDW  16.9 (H) 11.5 - 15.5 %   Platelets 408 (H) 150 - 400 K/uL   nRBC 0.0 0.0 - 0.2 %  Type and screen [place]  Result Value Ref Range   ABO/RH(D) B POS    Antibody Screen NEG    Sample Expiration 08/16/2022,2359    Extend sample reason      NO TRANSFUSIONS OR PREGNANCY IN THE PAST 3 MONTHS Performed at [facility], [address] [place], [place] [address]     DG Lumbar Spine 2-3 Views  Result Date: 08/14/2022 CLINICAL DATA:  Anterior interbody fusion at L4-5 EXAM: LUMBAR SPINE - 2-3 VIEW COMPARISON:  06/07/2022 FLUOROSCOPY TIME:  Radiation Exposure Index (as provided by the fluoroscopic device): 26.22 mGy If the device does not provide the exposure index: Fluoroscopy Time:  1 minute 5 seconds Number of Acquired Images:  2 FINDINGS: Changes of interbody fusion are noted at L4-5 with anterior fixation. Surgical staples are seen adjacent to the operative bed. No radiopaque foreign body is seen. IMPRESSION: L4-5 anterior fixation. Electronically Signed   By: Inez Catalina M.D.   On: 08/14/2022 11:23   DG C-Arm 1-60 Min-No Report  Result Date: 08/14/2022 Fluoroscopy was utilized by the requesting physician.  No radiographic interpretation.   DG C-Arm  1-60 Min-No Report  Result Date: 08/14/2022 Fluoroscopy was utilized by the requesting physician.  No radiographic interpretation.   DG C-Arm 1-60 Min-No Report  Result Date: 08/14/2022 Fluoroscopy was utilized by the requesting physician.  No radiographic interpretation.   DG OR LOCAL ABDOMEN  Result Date: 08/14/2022 CLINICAL DATA:  Status post anterior lumbar fusion, evaluate for retained foreign body EXAM: OR LOCAL ABDOMEN COMPARISON:  None Available. FINDINGS: Single frontal film of the lower lumbar spine was performed and again reveals postsurgical changes at L4-5 with anterior fixation similar to that noted on the prior intraoperative exam. Surgical staples are noted. No radiopaque foreign body is seen. No other  focal abnormality is noted. IMPRESSION: No evidence of retained radiopaque foreign body. Critical Value/emergent results were called by telephone at the time of interpretation on 08/14/2022 at 11:10 am to [facility] in [place], who verbally acknowledged these results. Electronically Signed   By: Inez Catalina M.D.   On: 08/14/2022 11:11    Antibiotics:  Anti-infectives (From admission, onward)    Start     Dose/Rate Route Frequency Ordered Stop   08/14/22 1600  ceFAZolin (ANCEF) IVPB 2g/100 mL premix        2 g 200 mL/hr over 30 Minutes Intravenous Every 8 hours 08/14/22 1232 08/14/22 2347   08/14/22 0700  ceFAZolin (ANCEF) IVPB 2g/100 mL premix        2 g 200 mL/hr over 30 Minutes Intravenous On call to O.R. 08/14/22 0649 08/14/22 0831       Discharge Exam: Blood pressure 117/68, pulse 70, temperature 97.6 F (36.4 C), temperature source Oral, resp. rate 18, height 6' (1.829 m), weight 73.9 kg, SpO2 98 %. Neurologic: Grossly normal Dressing dry  Discharge Medications:   Allergies as of 08/15/2022       Reactions   Tramadol Itching   Morphine And Related Itching   Oxycodone Itching   Tolerates with benadryl         Medication List     TAKE these medications    adapalene 0.1 % gel Commonly known as: DIFFERIN Apply 1 Application topically at bedtime. What changed:  when to take this additional instructions   aspirin EC 81 MG tablet Take 81 mg by mouth daily. Swallow whole.   atorvastatin 40 MG tablet Commonly known as: LIPITOR Take 1 tablet (40 mg total) by mouth daily. What changed: when to take this   benzoyl peroxide 10 % Liqd Generic drug: Benzoyl Peroxide Apply 1 Application topically daily.   bimatoprost 0.01 % Soln Commonly known as: LUMIGAN Place 1 drop into both eyes at bedtime.   citalopram 20 MG tablet Commonly known as: CELEXA Take 1 tablet (20 mg total) by mouth daily.   clopidogrel 75 MG tablet Commonly known as: PLAVIX Take 1 tablet (75  mg total) by mouth daily.   diphenhydrAMINE 25 mg capsule Commonly known as: BENADRYL Take 50 mg by mouth every 6 (six) hours as needed for itching.   doxycycline 100 MG tablet Commonly known as: VIBRA-TABS Take 1 tablet (100 mg total) by mouth 2 (two) times daily.   famotidine 40 MG tablet Commonly known as: Pepcid Take 1 tablet (40 mg total) by mouth 2 (two) times daily. Take every morning and at bedtime   fluticasone 50 MCG/ACT nasal spray Commonly known as: FLONASE USE 2 SPRAYS DAILY IN BOTH NOSTRILS What changed:  how much to take how to take this when to take this reasons to take this additional instructions  gabapentin 100 MG capsule Commonly known as: NEURONTIN Take 200 mg by mouth 2 (two) times daily.   hydrochlorothiazide 12.5 MG capsule Commonly known as: MICROZIDE TAKE 1 CAPSULE BY MOUTH EVERY DAY   ibuprofen 200 MG tablet Commonly known as: ADVIL Take 400 mg by mouth every 6 (six) hours as needed for headache or moderate pain.   iron polysaccharides 150 MG capsule Commonly known as: Nu-Iron Take 1 capsule (150 mg total) by mouth daily.   naloxone 4 MG/0.1ML Liqd nasal spray kit Commonly known as: NARCAN Place 1 spray into the nose as needed (opioid overdose).   nortriptyline 25 MG capsule Commonly known as: PAMELOR Take 25 mg by mouth at bedtime.   omeprazole 20 MG tablet Commonly known as: PRILOSEC OTC Take 20 mg by mouth daily.   One Daily For Men/Lycopene Tabs Take 1 tablet by mouth daily.   oxybutynin 5 MG tablet Commonly known as: DITROPAN Take 1 tablet (5 mg total) by mouth at bedtime.   oxyCODONE-acetaminophen 10-325 MG tablet Commonly known as: PERCOCET Take 1 tablet by mouth every 4 (four) hours as needed for pain. What changed: when to take this   pantoprazole 40 MG tablet Commonly known as: PROTONIX Take 1 tablet (40 mg total) by mouth daily.   sildenafil 100 MG tablet Commonly known as: VIAGRA Take 100 mg by mouth as  needed for erectile dysfunction.   tamsulosin 0.4 MG Caps capsule Commonly known as: FLOMAX Take 1 capsule (0.4 mg total) by mouth 2 (two) times daily.   timolol 0.5 % ophthalmic solution Commonly known as: TIMOPTIC Place 1 drop into both eyes daily.   tiZANidine 4 MG tablet Commonly known as: ZANAFLEX Take 4 mg by mouth every 8 (eight) hours as needed for muscle spasms.   tretinoin 0.1 % cream Commonly known as: RETIN-A Apply 1 application  topically at bedtime. What changed:  when to take this additional instructions   zolpidem 10 MG tablet Commonly known as: AMBIEN Take 1 tablet (10 mg total) by mouth at bedtime as needed. for sleep               Durable Medical Equipment  (From admission, onward)           Start     Ordered   08/14/22 1246  DME Walker rolling  Once       Question:  Patient needs a walker to treat with the following condition  Answer:  S/P lumbar fusion   08/14/22 1245   08/14/22 1246  DME 3 n 1  Once        08/14/22 1245            Disposition: home   Final Dx: ALIF L4-5  Discharge Instructions      Remove dressing in 72 hours   Complete by: As directed    Call MD for:  difficulty breathing, headache or visual disturbances   Complete by: As directed    Call MD for:  persistant nausea and vomiting   Complete by: As directed    Call MD for:  redness, tenderness, or signs of infection (pain, swelling, redness, odor or green/yellow discharge around incision site)   Complete by: As directed    Call MD for:  severe uncontrolled pain   Complete by: As directed    Call MD for:  temperature >100.4   Complete by: As directed    Diet - low sodium heart healthy   Complete by: As directed  Increase activity slowly   Complete by: As directed           Signed: Eustace Moore 08/15/2022, 7:50 AM

## 2022-08-15 NOTE — Plan of Care (Signed)
Pt doing well. Pt given D/C instructions with verbal understanding. Rx's were sent to the pharmacy by MD. Pt's incision is clean and dry with no sign of infection. Pt's IV was removed prior to D/C. Pt D/C'd home via wheelchair per MD order. Pt is stable @ D/C and has no other needs at this time. Rui Wordell, RN  

## 2022-08-15 NOTE — Evaluation (Signed)
Physical Therapy Evaluation Patient Details Name: Mark Dunlap MRN: [id number] DOB: [birth year] Today's Date: 08/15/2022  History of Present Illness  61 y/o male s/p ALIF L4-5 on 08/14/22. PMH - ACDF, back fusion, HTN, CVA, L eye blindness  Clinical Impression  Patient admitted following above procedure. Patient is typically independent with no AD and states he will have a friend staying with him PRN. OOB with OT on arrival. Able to ambulate with/without AD with supervision and no overt LOB. Patient utilizing RW for comfort. Able to recall 3/3 back precautions at end of session without cueing. Educated patient on brace wear, progressive walking program, and car transfer, patient verbalized understanding. No brace in room at evaluation as patient left it at home prior to procedure. No further skilled PT needs identified acutely. No PT follow up recommended at this time.        Recommendations for follow up therapy are one component of a multi-disciplinary discharge planning process, led by the attending physician.  Recommendations may be updated based on patient status, additional functional criteria and insurance authorization.  Follow Up Recommendations No PT follow up      Assistance Recommended at Discharge Intermittent Supervision/Assistance  Patient can return home with the following  A little help with bathing/dressing/bathroom;Assistance with cooking/housework;Direct supervision/assist for medications management;Direct supervision/assist for financial management;Assist for transportation    Equipment Recommendations None recommended by PT  Recommendations for Other Services       Functional Status Assessment Patient has had a recent decline in their functional status and demonstrates the ability to make significant improvements in function in a reasonable and predictable amount of time.     Precautions / Restrictions Precautions Precautions: Back Precaution Booklet Issued: Yes  (comment) Required Braces or Orthoses: Spinal Brace Spinal Brace:  (left brace at home) Restrictions Weight Bearing Restrictions: No      Mobility  Bed Mobility               General bed mobility comments: up with OT on arrival    Transfers Overall transfer level: Modified independent Equipment used: None               General transfer comment: able to stand from EOB without assistance    Ambulation/Gait Ambulation/Gait assistance: Supervision Gait Distance (Feet): 150 Feet Assistive device: Rolling Mark Dunlap (2 wheels), None Gait Pattern/deviations: Step-through pattern, Decreased stride length Gait velocity: decreased     General Gait Details: ambulating with RW for comfort but able to ambulate without AD. With/without AD, patient leaning posteriorly  Stairs            Wheelchair Mobility    Modified Rankin (Stroke Patients Only)       Balance Overall balance assessment: Mild deficits observed, not formally tested                                           Pertinent Vitals/Pain Pain Assessment Pain Assessment: Faces Faces Pain Scale: Hurts little more Pain Location: incision site in abdomen Pain Descriptors / Indicators: Sore Pain Intervention(s): Monitored during session    Home Living Family/patient expects to be discharged to:: Private residence Living Arrangements: Alone Available Help at Discharge: Friend(s);Available PRN/intermittently Type of Home: House Home Access: Level entry       Home Layout: One level        Prior Function Prior Level of Function : Independent/Modified  Independent;Working/employed                     Hand Dominance        Extremity/Trunk Assessment   Upper Extremity Assessment Upper Extremity Assessment: Defer to OT evaluation    Lower Extremity Assessment Lower Extremity Assessment: Generalized weakness    Cervical / Trunk Assessment Cervical / Trunk Assessment:  Back Surgery  Communication   Communication: No difficulties  Cognition Arousal/Alertness: Awake/alert Behavior During Therapy: WFL for tasks assessed/performed Overall Cognitive Status: Within Functional Limits for tasks assessed                                 General Comments: able to recall back precautions at end of session without cueing        General Comments      Exercises     Assessment/Plan    PT Assessment Patient does not need any further PT services  PT Problem List         PT Treatment Interventions      PT Goals (Current goals can be found in the Care Plan section)  Acute Rehab PT Goals Patient Stated Goal: to get out of here PT Goal Formulation: All assessment and education complete, DC therapy    Frequency       Co-evaluation               AM-PAC PT "6 Clicks" Mobility  Outcome Measure Help needed turning from your back to your side while in a flat bed without using bedrails?: None Help needed moving from lying on your back to sitting on the side of a flat bed without using bedrails?: None Help needed moving to and from a bed to a chair (including a wheelchair)?: None Help needed standing up from a chair using your arms (e.g., wheelchair or bedside chair)?: None Help needed to walk in hospital room?: A Little Help needed climbing 3-5 steps with a railing? : A Little 6 Click Score: 22    End of Session   Activity Tolerance: Patient tolerated treatment well Patient left: with call bell/phone within reach (seated EOB) Nurse Communication: Mobility status PT Visit Diagnosis: Muscle weakness (generalized) (M62.81)    Time: 9470-9628 PT Time Calculation (min) (ACUTE ONLY): 16 min   Charges:   PT Evaluation $PT Eval Low Complexity: 1 Low          Mark Dunlap A. Mark Dunlap PT, DPT Acute Rehabilitation Services Office [phone]   Mark Dunlap 08/15/2022, 9:05 AM

## 2022-08-15 NOTE — Evaluation (Signed)
Occupational Therapy Evaluation Patient Details Name: Mark Dunlap MRN: [id number] DOB: August 27, 1961 Today's Date: 08/15/2022   History of Present Illness 61 y/o male s/p ALIF L4-5 on 08/14/22. PMH - ACDF, back fusion, HTN, CVA, L eye blindness   Clinical Impression   PTA, pt was living alone and was independent. Currently, pt performing at Mod I level with increased time and cues for precaution adherence during ADLs and functional mobility with and without RW. Provided education and handout on back precautions, grooming, brace management, LB ADLs, toileting, and shower transfer; pt demonstrated understanding. Answered all pt questions. Recommend dc home once medically stable per physician. All acute OT needs met and will sign off. Thank you.    Recommendations for follow up therapy are one component of a multi-disciplinary discharge planning process, led by the attending physician.  Recommendations may be updated based on patient status, additional functional criteria and insurance authorization.   Follow Up Recommendations  No OT follow up    Assistance Recommended at Discharge Intermittent Supervision/Assistance  Patient can return home with the following      Functional Status Assessment  Patient has had a recent decline in their functional status and demonstrates the ability to make significant improvements in function in a reasonable and predictable amount of time.  Equipment Recommendations  None recommended by OT    Recommendations for Other Services       Precautions / Restrictions Precautions Precautions: Back Precaution Booklet Issued: Yes (comment) Required Braces or Orthoses: Spinal Brace Spinal Brace:  (left brace at home) Restrictions Weight Bearing Restrictions: No      Mobility Bed Mobility Overal bed mobility: Modified Independent             General bed mobility comments: Performing log roll with increased time    Transfers Overall transfer  level: Modified independent Equipment used: None               General transfer comment: able to stand from EOB without assistance      Balance Overall balance assessment: Mild deficits observed, not formally tested                                         ADL either performed or assessed with clinical judgement   ADL Overall ADL's : Modified independent                                       General ADL Comments: Increased time and cues for adherance to precuations. Providing education on back precautions, LB ADLs, toileting, oral care, shower transfer, brace management, and bed mobility. Performing with increased time and cues for adherance to precautions     Vision Baseline Vision/History: 1 Wears glasses       Perception     Praxis      Pertinent Vitals/Pain Pain Assessment Pain Assessment: Faces Faces Pain Scale: Hurts little more Pain Location: incision site in abdomen Pain Descriptors / Indicators: Sore Pain Intervention(s): Monitored during session, Limited activity within patient's tolerance, Repositioned     Hand Dominance Right   Extremity/Trunk Assessment Upper Extremity Assessment Upper Extremity Assessment: Generalized weakness   Lower Extremity Assessment Lower Extremity Assessment: Defer to PT evaluation   Cervical / Trunk Assessment Cervical / Trunk Assessment: Back Surgery   Communication Communication Communication:  No difficulties   Cognition Arousal/Alertness: Awake/alert Behavior During Therapy: WFL for tasks assessed/performed, Impulsive Overall Cognitive Status: Within Functional Limits for tasks assessed                                 General Comments: difficulty maintaining precautions without cues     General Comments       Exercises     Shoulder Instructions      Home Living Family/patient expects to be discharged to:: Private residence Living Arrangements:  Alone Available Help at Discharge: Friend(s);Available PRN/intermittently (Sister and friend) Type of Home: House Home Access: Level entry     Home Layout: One level     Bathroom Shower/Tub: Walk-in Psychologist, prison and probation services: Standard     Home Equipment: Conservation officer, nature (2 wheels);BSC/3in1          Prior Functioning/Environment Prior Level of Function : Independent/Modified Independent;Working/employed                        OT Problem List: Decreased activity tolerance;Decreased knowledge of use of DME or AE;Decreased knowledge of precautions;Pain      OT Treatment/Interventions:      OT Goals(Current goals can be found in the care plan section) Acute Rehab OT Goals Patient Stated Goal: Go home OT Goal Formulation: All assessment and education complete, DC therapy  OT Frequency:      Co-evaluation              AM-PAC OT "6 Clicks" Daily Activity     Outcome Measure Help from another person eating meals?: None Help from another person taking care of personal grooming?: None Help from another person toileting, which includes using toliet, bedpan, or urinal?: None Help from another person bathing (including washing, rinsing, drying)?: None Help from another person to put on and taking off regular upper body clothing?: None Help from another person to put on and taking off regular lower body clothing?: None 6 Click Score: 24   End of Session Equipment Utilized During Treatment: Rolling walker (2 wheels) Nurse Communication: Mobility status  Activity Tolerance: Patient tolerated treatment well Patient left: Other (comment) (in hallway with PT)  OT Visit Diagnosis: Unsteadiness on feet (R26.81);Other abnormalities of gait and mobility (R26.89);Muscle weakness (generalized) (M62.81);Pain Pain - Right/Left: Left Pain - part of body:  (stomach)                Time: 3662-9476 OT Time Calculation (min): 18 min Charges:  OT General Charges $OT Visit: 1  Visit OT Evaluation $OT Eval Low Complexity: 1 Low  Abilene Mcphee MSOT, OTR/L Acute Rehab Office: [place] 08/15/2022, 9:43 AM

## 2022-08-15 NOTE — Progress Notes (Addendum)
  Progress Note    08/15/2022 7:46 AM 1 Day Post-Op  Subjective:  passing gas this morning.  No nausea after breakfast.   Vitals:   08/14/22 2316 08/15/22 0343  BP: 129/75 117/68  Pulse: 61 70  Resp: 18 18  Temp: 98.3 F (36.8 C) 97.6 F (36.4 C)  SpO2: 100% 98%   Physical Exam: Lungs:  non labored Incisions:  abd incision soft, some bloody drainage on bandage Extremities:  palpable DP pulses bilaterally Abdomen: soft, NT, ND Neurologic: A&O  CBC    Component Value Date/Time   WBC 9.0 08/02/2022 0959   RBC 4.55 08/02/2022 0959   HGB 10.7 (L) 08/02/2022 0959   HCT 35.4 (L) 08/02/2022 0959   PLT 408 (H) 08/02/2022 0959   MCV 77.8 (L) 08/02/2022 0959   MCH 23.5 (L) 08/02/2022 0959   MCHC 30.2 08/02/2022 0959   RDW 16.9 (H) 08/02/2022 0959   LYMPHSABS 2,147 12/07/2021 1659   MONOABS 1.1 (H) 04/13/2021 1208   EOSABS 389 12/07/2021 1659   BASOSABS 81 12/07/2021 1659    BMET    Component Value Date/Time   NA 138 08/02/2022 0959   K 3.6 08/02/2022 0959   CL 98 08/02/2022 0959   CO2 29 08/02/2022 0959   GLUCOSE 101 (H) 08/02/2022 0959   BUN 8 08/02/2022 0959   CREATININE 1.07 08/02/2022 0959   CREATININE 1.06 12/07/2021 1659   CALCIUM 10.0 08/02/2022 0959   GFRNONAA >60 08/02/2022 0959   GFRNONAA 77 05/02/2020 1026   GFRAA 89 05/02/2020 1026    INR    Component Value Date/Time   INR 1.0 08/02/2022 0959     Intake/Output Summary (Last 24 hours) at 08/15/2022 0746 Last data filed at 08/14/2022 1400 Gross per 24 hour  Intake 1690 ml  Output 780 ml  Net 910 ml     Assessment/Plan:  61 y.o. male is s/p anterior spine exposure 1 Day Post-Op   BLE well perfused with palpable DP pulses Tolerating diet without N/V; passing gas Ok for discharge from vascular standpoint   Dagoberto Ligas, PA-C [facility] [phone] 08/15/2022 7:46 AM  I have seen and evaluated the patient. I agree with the PA note as documented above.    Marty Heck, MD [facility] [place] Office: [phone]

## 2022-08-16 ENCOUNTER — Encounter (HOSPITAL_COMMUNITY): Payer: Self-pay | Admitting: Neurological Surgery

## 2022-08-22 MED FILL — Sodium Chloride IV Soln 0.9%: INTRAVENOUS | Qty: 1000 | Status: AC

## 2022-08-22 MED FILL — Heparin Sodium (Porcine) Inj 1000 Unit/ML: INTRAMUSCULAR | Qty: 30 | Status: AC

## 2022-08-26 ENCOUNTER — Telehealth: Payer: Self-pay

## 2022-08-26 ENCOUNTER — Telehealth: Payer: Self-pay | Admitting: Internal Medicine

## 2022-08-26 NOTE — Telephone Encounter (Signed)
Transition Care Management Unsuccessful Follow-up Telephone Call  Date of discharge and from where:  08/15/22 [facility]  [place]    Attempts:  1st Attempt  Reason for unsuccessful TCM follow-up call:  Left voice message

## 2022-08-26 NOTE — Telephone Encounter (Signed)
Pt ED visit medication was confirmed, pt stated he was prescribed Oxycodone

## 2022-08-26 NOTE — Telephone Encounter (Signed)
Patient called returning [facility] call. Patient also stated that he needs a refill on zolpidem (AMBIEN) 10 MG tablet sent into his pharmacy due to him only having one pill left. Pharmacy is [place] ([facility]  - [place]. Patient stated he will be waiting for another call back.

## 2022-08-26 NOTE — Telephone Encounter (Signed)
Transition Care Management Follow-up Telephone Call Date of discharge and from where: 08/15/22 @ [facility]  How have you been since you were released from the hospital? Still getting better and trying to heal Any questions or concerns? No  Items Reviewed: Did the pt receive and understand the discharge instructions provided? No  Medications obtained and verified? No  Other? No  Any new allergies since your discharge? No  Dietary orders reviewed? Yes Do you have support at home? Pt stated No, but have his sister who helps him with cooking   Home Care and Equipment/Supplies: Were home health services ordered? no If so, what is the name of the agency? N/A  Has the agency set up a time to come to the patient's home? no Were any new equipment or medical supplies ordered?  No What is the name of the medical supply agency? N/A Were you able to get the supplies/equipment? not applicable Do you have any questions related to the use of the equipment or supplies? No  Functional Questionnaire: (I = Independent and D = Dependent) ADLs: I  Bathing/Dressing- I  Meal Prep- D  Eating- I  Maintaining continence- I  Transferring/Ambulation- D  Managing Meds- I  Follow up appointments reviewed:  PCP Hospital f/u appt confirmed? N/A, pt said yes, but does not know when he wants to see him since he is trying to get better, stated he will call back when he know when he wants to schedule an appointment  [facility] f/u appt confirmed? Yes  Scheduled to see Dr. Boyce Medic[facility] on 08/27/22 @ N/A. Are transportation arrangements needed? No  If their condition worsens, is the pt aware to call PCP or go to the Emergency Dept.? Yes Was the patient provided with contact information for the PCP's office or ED? Yes Was to pt encouraged to call back with questions or concerns? Yes

## 2022-08-26 NOTE — Telephone Encounter (Signed)
error 

## 2022-08-27 MED ORDER — ZOLPIDEM TARTRATE 10 MG PO TABS: 10.0000 mg | ORAL_TABLET | Freq: Every evening | ORAL | 1 refills | Status: DC | PRN

## 2022-08-27 NOTE — Telephone Encounter (Signed)
Please send ambien to [facility] on [address], I will return patient call.

## 2022-09-04 ENCOUNTER — Ambulatory Visit: Payer: Medicare Other | Admitting: Podiatry

## 2022-11-14 ENCOUNTER — Encounter (INDEPENDENT_AMBULATORY_CARE_PROVIDER_SITE_OTHER): Payer: Self-pay

## 2023-02-14 ENCOUNTER — Telehealth: Payer: Self-pay | Admitting: Internal Medicine

## 2023-02-14 ENCOUNTER — Other Ambulatory Visit: Payer: Self-pay | Admitting: Internal Medicine

## 2023-02-14 MED ORDER — ATORVASTATIN CALCIUM 40 MG PO TABS: 40.0000 mg | ORAL_TABLET | Freq: Every day | ORAL | 0 refills | Status: DC

## 2023-02-14 NOTE — Telephone Encounter (Signed)
Prescription Request  02/14/2023  LOV: 06/06/2022  What is the name of the medication or equipment?  atorvastatin (LIPITOR) 40 MG tablet [   Have you contacted your pharmacy to request a refill? No   Which pharmacy would you like this sent to?   [facility] [address] ([address] [place] - [address] [place]) [place] [address] Phone: [phone]  Fax: [phone]   Patient notified that their request is being sent to the clinical staff for review and that they should receive a response within 2 business days.   Please advise at Mobile [phone] (mobile)

## 2023-02-14 NOTE — Telephone Encounter (Signed)
Pt due for annual appt 30 day was sent to [facility] KitchenRaechel Chute

## 2023-02-21 ENCOUNTER — Other Ambulatory Visit: Payer: Self-pay

## 2023-02-21 MED ORDER — ATORVASTATIN CALCIUM 40 MG PO TABS: 40.0000 mg | ORAL_TABLET | Freq: Every day | ORAL | 0 refills | Status: DC

## 2023-03-20 ENCOUNTER — Other Ambulatory Visit: Payer: Self-pay | Admitting: Internal Medicine

## 2023-03-21 ENCOUNTER — Other Ambulatory Visit: Payer: Self-pay | Admitting: Internal Medicine

## 2023-03-25 ENCOUNTER — Other Ambulatory Visit: Payer: Self-pay | Admitting: Neurological Surgery

## 2023-03-25 DIAGNOSIS — S32009A Unspecified fracture of unspecified lumbar vertebra, initial encounter for closed fracture: Secondary | ICD-10-CM

## 2023-03-31 ENCOUNTER — Ambulatory Visit: Payer: Medicare Other | Admitting: Internal Medicine

## 2023-03-31 ENCOUNTER — Encounter: Payer: Self-pay | Admitting: Internal Medicine

## 2023-03-31 VITALS — BP 122/74 | HR 72 | Temp 98.5°F | Ht 72.0 in | Wt 153.0 lb

## 2023-03-31 DIAGNOSIS — C61 Malignant neoplasm of prostate: Secondary | ICD-10-CM

## 2023-03-31 DIAGNOSIS — F17200 Nicotine dependence, unspecified, uncomplicated: Secondary | ICD-10-CM

## 2023-03-31 DIAGNOSIS — Z0000 Encounter for general adult medical examination without abnormal findings: Secondary | ICD-10-CM | POA: Diagnosis not present

## 2023-03-31 DIAGNOSIS — D509 Iron deficiency anemia, unspecified: Secondary | ICD-10-CM

## 2023-03-31 DIAGNOSIS — F32A Depression, unspecified: Secondary | ICD-10-CM

## 2023-03-31 DIAGNOSIS — E538 Deficiency of other specified B group vitamins: Secondary | ICD-10-CM | POA: Diagnosis not present

## 2023-03-31 DIAGNOSIS — E559 Vitamin D deficiency, unspecified: Secondary | ICD-10-CM

## 2023-03-31 DIAGNOSIS — R739 Hyperglycemia, unspecified: Secondary | ICD-10-CM | POA: Diagnosis not present

## 2023-03-31 DIAGNOSIS — I10 Essential (primary) hypertension: Secondary | ICD-10-CM | POA: Diagnosis not present

## 2023-03-31 DIAGNOSIS — I700 Atherosclerosis of aorta: Secondary | ICD-10-CM | POA: Diagnosis not present

## 2023-03-31 DIAGNOSIS — Z0001 Encounter for general adult medical examination with abnormal findings: Secondary | ICD-10-CM

## 2023-03-31 DIAGNOSIS — E7800 Pure hypercholesterolemia, unspecified: Secondary | ICD-10-CM

## 2023-03-31 DIAGNOSIS — M79609 Pain in unspecified limb: Secondary | ICD-10-CM | POA: Insufficient documentation

## 2023-03-31 LAB — CBC WITH DIFFERENTIAL/PLATELET
Basophils Absolute: 0.1 10*3/uL (ref 0.0–0.1)
Basophils Relative: 1 % (ref 0.0–3.0)
Eosinophils Absolute: 0.2 10*3/uL (ref 0.0–0.7)
Eosinophils Relative: 2.4 % (ref 0.0–5.0)
HCT: 41.2 % (ref 39.0–52.0)
Hemoglobin: 12.8 g/dL — ABNORMAL LOW (ref 13.0–17.0)
Lymphocytes Relative: 17.1 % (ref 12.0–46.0)
Lymphs Abs: 1.2 10*3/uL (ref 0.7–4.0)
MCHC: 31 g/dL (ref 30.0–36.0)
MCV: 78.9 fl (ref 78.0–100.0)
Monocytes Absolute: 0.7 10*3/uL (ref 0.1–1.0)
Monocytes Relative: 10 % (ref 3.0–12.0)
Neutro Abs: 5.1 10*3/uL (ref 1.4–7.7)
Neutrophils Relative %: 69.5 % (ref 43.0–77.0)
Platelets: 566 10*3/uL — ABNORMAL HIGH (ref 150.0–400.0)
RBC: 5.22 Mil/uL (ref 4.22–5.81)
RDW: 18.8 % — ABNORMAL HIGH (ref 11.5–15.5)
WBC: 7.3 10*3/uL (ref 4.0–10.5)

## 2023-03-31 LAB — LIPID PANEL
Cholesterol: 140 mg/dL (ref 0–200)
HDL: 65.1 mg/dL (ref >39.00)
LDL Cholesterol: 40 mg/dL (ref 0–99)
NonHDL: 74.81
Total CHOL/HDL Ratio: 2
Triglycerides: 174 mg/dL — ABNORMAL HIGH (ref 0.0–149.0)
VLDL: 34.8 mg/dL (ref 0.0–40.0)

## 2023-03-31 LAB — BASIC METABOLIC PANEL
BUN: 13 mg/dL (ref 6–23)
CO2: 29 mEq/L (ref 19–32)
Calcium: 10.1 mg/dL (ref 8.4–10.5)
Chloride: 101 mEq/L (ref 96–112)
Creatinine, Ser: 1.04 mg/dL (ref 0.40–1.50)
GFR: 76.61 mL/min (ref >60.00)
Glucose, Bld: 111 mg/dL — ABNORMAL HIGH (ref 70–99)
Potassium: 4.6 mEq/L (ref 3.5–5.1)
Sodium: 139 mEq/L (ref 135–145)

## 2023-03-31 LAB — HEPATIC FUNCTION PANEL
ALT: 18 U/L (ref 0–53)
AST: 16 U/L (ref 0–37)
Albumin: 4.4 g/dL (ref 3.5–5.2)
Alkaline Phosphatase: 121 U/L — ABNORMAL HIGH (ref 39–117)
Bilirubin, Direct: 0.1 mg/dL (ref 0.0–0.3)
Total Bilirubin: 0.5 mg/dL (ref 0.2–1.2)
Total Protein: 8 g/dL (ref 6.0–8.3)

## 2023-03-31 LAB — PSA: PSA: 14.1 ng/mL — ABNORMAL HIGH (ref 0.10–4.00)

## 2023-03-31 LAB — IBC PANEL
Iron: 27 ug/dL — ABNORMAL LOW (ref 42–165)
Saturation Ratios: 5 % — ABNORMAL LOW (ref 20.0–50.0)
TIBC: 541.8 ug/dL — ABNORMAL HIGH (ref 250.0–450.0)
Transferrin: 387 mg/dL — ABNORMAL HIGH (ref 212.0–360.0)

## 2023-03-31 LAB — VITAMIN D 25 HYDROXY (VIT D DEFICIENCY, FRACTURES): VITD: 40.88 ng/mL (ref 30.00–100.00)

## 2023-03-31 LAB — HEMOGLOBIN A1C: Hgb A1c MFr Bld: 5.4 % (ref 4.6–6.5)

## 2023-03-31 LAB — TSH: TSH: 1.66 u[IU]/mL (ref 0.35–5.50)

## 2023-03-31 LAB — FERRITIN: Ferritin: 12.2 ng/mL — ABNORMAL LOW (ref 22.0–322.0)

## 2023-03-31 LAB — VITAMIN B12: Vitamin B-12: 391 pg/mL (ref 211–911)

## 2023-03-31 MED ORDER — OXYBUTYNIN CHLORIDE 5 MG PO TABS: 5.0000 mg | ORAL_TABLET | Freq: Every day | ORAL | 3 refills | Status: AC

## 2023-03-31 MED ORDER — GABAPENTIN 100 MG PO CAPS: 200.0000 mg | ORAL_CAPSULE | Freq: Two times a day (BID) | ORAL | 1 refills | Status: DC

## 2023-03-31 MED ORDER — CLOPIDOGREL BISULFATE 75 MG PO TABS: 75.0000 mg | ORAL_TABLET | Freq: Every day | ORAL | 3 refills | Status: DC

## 2023-03-31 MED ORDER — TAMSULOSIN HCL 0.4 MG PO CAPS: 0.4000 mg | ORAL_CAPSULE | Freq: Two times a day (BID) | ORAL | 3 refills | Status: DC

## 2023-03-31 MED ORDER — SILDENAFIL CITRATE 100 MG PO TABS: 100.0000 mg | ORAL_TABLET | ORAL | 11 refills | Status: DC | PRN

## 2023-03-31 MED ORDER — ATORVASTATIN CALCIUM 40 MG PO TABS: 40.0000 mg | ORAL_TABLET | Freq: Every day | ORAL | 3 refills | Status: DC

## 2023-03-31 MED ORDER — CITALOPRAM HYDROBROMIDE 20 MG PO TABS: 20.0000 mg | ORAL_TABLET | Freq: Every day | ORAL | 3 refills | Status: DC

## 2023-03-31 MED ORDER — FLUTICASONE PROPIONATE 50 MCG/ACT NA SUSP: NASAL | 0 refills | Status: DC

## 2023-03-31 MED ORDER — NORTRIPTYLINE HCL 25 MG PO CAPS: 25.0000 mg | ORAL_CAPSULE | Freq: Every day | ORAL | 1 refills | Status: DC

## 2023-03-31 MED ORDER — FAMOTIDINE 40 MG PO TABS: 40.0000 mg | ORAL_TABLET | Freq: Two times a day (BID) | ORAL | 3 refills | Status: DC

## 2023-03-31 MED ORDER — ZOLPIDEM TARTRATE 10 MG PO TABS: 10.0000 mg | ORAL_TABLET | Freq: Every evening | ORAL | 1 refills | Status: AC | PRN

## 2023-03-31 MED ORDER — PANTOPRAZOLE SODIUM 40 MG PO TBEC: 40.0000 mg | DELAYED_RELEASE_TABLET | Freq: Every day | ORAL | 3 refills | Status: DC

## 2023-03-31 MED ORDER — TRETINOIN 0.1 % EX CREA: 1.0000 "application " | TOPICAL_CREAM | Freq: Every day | CUTANEOUS | 2 refills | Status: DC

## 2023-03-31 MED ORDER — HYDROCHLOROTHIAZIDE 12.5 MG PO CAPS: ORAL_CAPSULE | ORAL | 3 refills | Status: DC

## 2023-03-31 MED ORDER — ADAPALENE 0.1 % EX GEL: 1.0000 | Freq: Every day | CUTANEOUS | 1 refills | Status: AC

## 2023-03-31 NOTE — Progress Notes (Unsigned)
Patient ID: Mark Dunlap, male   DOB: Jun 06, 1961, 62 y.o.   MRN: [id number]         Chief Complaint:: wellness exam and Anxiety (And attitude issues )  , prostate ca, htn, hld, smoker, aortic atherosclerosis, hyperglycemia, low vit d       HPI:  Mark Dunlap is a 62 y.o. male here for wellness exam; declines covid booster, for shingrix at pharmacy, o/w up to date; still smokign not ready to quit.                Also did have a fall last wk to the lower back with some residual pain, incidentally has CT lumber planned for later this wk, and fu [facility]   Denies worsening depressive symptoms, suicidal ideation, or panic; has ongoing anxiety,  increased recently with several social stressors. Has been out of celexa, willing to restart.    Denies urinary symptoms such as dysuria, frequency, urgency, flank pain, hematuria or n/v, fever, chills.     Wt Readings from Last 3 Encounters:  03/31/23 153 lb (69.4 kg)  08/14/22 163 lb (73.9 kg)  08/02/22 159 lb 3.2 oz (72.2 kg)   BP Readings from Last 3 Encounters:  03/31/23 122/74  08/15/22 118/64  08/02/22 120/73   Immunization History  Administered Date(s) Administered   Influenza Split 08/20/2012   Influenza Whole 08/25/2008   Influenza,inj,Quad PF,6+ Mos 07/19/2013, 07/06/2014, 08/08/2015, 08/21/2017, 09/07/2018, 07/01/2019, 07/16/2021   Influenza-Unspecified 07/17/2020   Moderna Sars-Covid-2 Vaccination 11/30/2019, 12/29/2019, 08/11/2020   PNEUMOCOCCAL CONJUGATE-20 07/16/2021   Pneumococcal Polysaccharide-23 07/30/2014, 07/01/2019   Td 09/15/2006   Tdap 08/08/2015   Health Maintenance Due  Topic Date Due   Zoster Vaccines- Shingrix (1 of 2) Never done   Lung Cancer Screening  02/05/2010   COVID-19 Vaccine (4 - 2023-24 season) 06/14/2022      Past Medical History:  Diagnosis Date   Aortic atherosclerosis (HCC)    Chronic back pain    upper to lower   Depression    GERD (gastroesophageal reflux disease)    Glaucoma,  both eyes    H/O: substance abuse (HCC)    03-21-2021   pt stated hx of ETOH/Crack cocaine-none since 12/2012 per pt/Does not Drive due to this   History of CVA (cerebrovascular accident) without residual deficits 10/26/2018   right BG/ CR infarct secondart to moderate SVD,  no residual   History of hyperthyroidism 2011   s/p RAI  06-20-2010,  followed by pcp   History of prostatitis 2010   HLD (hyperlipidemia)    Hypertension    followed by pcp    (nuclear stress test in epic 08-30-2013  normal no ischemia , ef 50%)   Legally blind in left eye, as defined in [place]    Blind Left eye, small amt vision Right eye   Lumbar radiculopathy    Lumbar radiculopathy    OA (osteoarthritis)    Prostate cancer [facility]) urologist--- dr Marlou Porch   dx 11-21-2020, Stage T1c, Gleason 4+3   Right thyroid nodule    Seasonal allergies    Spondylolisthesis, lumbar region    Spondylolisthesis, lumbar region    Unspecified fracture of unspecified lumbar vertebra, subsequent encounter for fracture with nonunion    Wears dentures    upper full and lower partial   Wears glasses    Past Surgical History:  Procedure Laterality Date   ABDOMINAL EXPOSURE N/A 08/14/2022   Procedure: ABDOMINAL EXPOSURE;  Surgeon: Cephus Shelling, MD;  Location:  [facility] OR;  Service: Vascular;  Laterality: N/A;   ANTERIOR CERVICAL DECOMP/DISCECTOMY FUSION  10/11/2011   Procedure: ANTERIOR CERVICAL DECOMPRESSION/DISCECTOMY FUSION 3 LEVELS;  Surgeon: Tia Alert;  Location: [facility];  Service: Neurosurgery;  Laterality: N/A;  Cervical three-four ,cervical four five cervical five six Anterior Cervical Decompression Fusion with peek + plate Nuvasive translational plate Orthofix peek (2 1/2 hours) Rm # [id number]   ANTERIOR CERVICAL DECOMP/DISCECTOMY FUSION N/A 07/29/2014   Procedure: ANTERIOR CERVICAL DECOMPRESSION/DISCECTOMY FUSION 1 LEVEL/HARDWARE REMOVAL;  Surgeon: Tia Alert, MD;  Location: [facility];  Service: Neurosurgery;   Laterality: N/A;  cervical six-seven   ANTERIOR LUMBAR FUSION N/A 08/14/2022   Procedure: Anterior Lumbar Interbody Fusion - Lumbar Four-Lumbar Five with plate;  Surgeon: Tia Alert, MD;  Location: [facility] OR;  Service: Neurosurgery;  Laterality: N/A;   COLONOSCOPY     COLONOSCOPY  lastone 07-12-2020   CYSTOSCOPY N/A 03/23/2021   Procedure: CYSTOSCOPY FLEXIBLE;  Surgeon: Bjorn Pippin, MD;  Location: [facility];  Service: Urology;  Laterality: N/A;   HAND SURGERY Right x2  12/ 2007   I & D abscess   HARDWARE REMOVAL N/A 09/11/2020   Procedure: Removal of hardware LUmbar Four-Lumbar Five;  Surgeon: Tia Alert, MD;  Location: [facility] OR;  Service: Neurosurgery;  Laterality: N/A;   LAMINECTOMY WITH POSTERIOR LATERAL ARTHRODESIS LEVEL 2 N/A 09/11/2020   Procedure: Laminectomy and Foraminotomy - Lumbar Three-Lumbar Four, posterior lateral fusion Lumbar Three-Four and Lumbar Four- Five;  Surgeon: Tia Alert, MD;  Location: [facility] OR;  Service: Neurosurgery;  Laterality: N/A;   POSTERIOR CERVICAL FUSION/FORAMINOTOMY N/A 09/23/2013   Procedure: CERVICALTWO TO CERVICAL SEVEN POSTERIOR CERVICAL FUSION/FORAMINOTOMY WITH LATERAL MASS FIXATION;  Surgeon: Tia Alert, MD;  Location: [facility];  Service: Neurosurgery;  Laterality: N/A;   POSTERIOR LUMBAR FUSION  11-27-2017  @[facility]    L4--5   RADIOACTIVE SEED IMPLANT N/A 03/23/2021   Procedure: RADIOACTIVE SEED IMPLANT/BRACHYTHERAPY IMPLANT;  Surgeon: Bjorn Pippin, MD;  Location: [facility];  Service: Urology;  Laterality: N/A;   SPACE OAR INSTILLATION N/A 03/23/2021   Procedure: SPACE OAR INSTILLATION;  Surgeon: Bjorn Pippin, MD;  Location: [facility];  Service: Urology;  Laterality: N/A;   TOTAL HIP ARTHROPLASTY  12/17/2011   Procedure: TOTAL HIP ARTHROPLASTY;  Surgeon: Eulas Post, MD;  Location: [facility] OR;  Service: Orthopedics;  Laterality: Left;    reports that he has been smoking cigarettes. He has a 47.00  pack-year smoking history. He has never used smokeless tobacco. He reports that he does not currently use drugs after having used the following drugs: "Crack" cocaine. He reports that he does not drink alcohol. family history includes Alcohol abuse in his brother, cousin, and father; Arthritis in his mother; Cancer in his brother and sister; Diabetes in his brother and sister; Goiter in his mother and sister; Heart disease in his father and another family member; Hyperlipidemia in his mother; Hypertension in his sister; Stroke in his father. Allergies  Allergen Reactions   Tramadol Itching   Morphine And Codeine Itching   Oxycodone Itching    Tolerates with benadryl    Current Outpatient Medications on File Prior to Visit  Medication Sig Dispense Refill   aspirin EC 81 MG tablet Take 81 mg by mouth daily. Swallow whole.     benzoyl peroxide 10 % LIQD Apply 1 Application topically daily.     bimatoprost (LUMIGAN) 0.01 % SOLN Place 1 drop into both  eyes at bedtime.     diphenhydrAMINE (BENADRYL) 25 mg capsule Take 50 mg by mouth every 6 (six) hours as needed for itching.      ibuprofen (ADVIL) 200 MG tablet Take 400 mg by mouth every 6 (six) hours as needed for headache or moderate pain.     Multiple Vitamins-Minerals (ONE DAILY FOR MEN/LYCOPENE) TABS Take 1 tablet by mouth daily.     naloxone (NARCAN) nasal spray 4 mg/0.1 mL Place 1 spray into the nose as needed (opioid overdose).     omeprazole (PRILOSEC OTC) 20 MG tablet Take 20 mg by mouth daily.     oxyCODONE-acetaminophen (PERCOCET) 10-325 MG tablet Take 1 tablet by mouth every 4 (four) hours as needed for pain. 120 tablet 0   timolol (TIMOPTIC) 0.5 % ophthalmic solution Place 1 drop into both eyes daily.  6   tiZANidine (ZANAFLEX) 4 MG tablet Take 4 mg by mouth every 8 (eight) hours as needed for muscle spasms.      No current facility-administered medications on file prior to visit.        ROS:  All others reviewed and  negative.  Objective        PE:  BP 122/74 (BP Location: Left Arm, Patient Position: Sitting, Cuff Size: Normal)   Pulse 72   Temp 98.5 F (36.9 C) (Oral)   Ht 6' (1.829 m)   Wt 153 lb (69.4 kg)   SpO2 99%   BMI 20.75 kg/m                 Constitutional: Pt appears in NAD               HENT: Head: NCAT.                Right Ear: External ear normal.                 Left Ear: External ear normal.                Eyes: . Pupils are equal, round, and reactive to light. Conjunctivae and EOM are normal               Nose: without d/c or deformity               Neck: Neck supple. Gross normal ROM               Cardiovascular: Normal rate and regular rhythm.                 Pulmonary/Chest: Effort normal and breath sounds without rales or wheezing.                Abd:  Soft, NT, ND, + BS, no organomegaly               Neurological: Pt is alert. At baseline orientation, motor grossly intact               Skin: Skin is warm. No rashes, no other new lesions, LE edema - none               Psychiatric: Pt behavior is normal without agitation   Micro: none  Cardiac tracings I have personally interpreted today:  none  Pertinent Radiological findings (summarize): none   Lab Results  Component Value Date   WBC 7.3 03/31/2023   HGB 12.8 (L) 03/31/2023   HCT 41.2 03/31/2023   PLT 566.0 (H) 03/31/2023   GLUCOSE 111 (H)  03/31/2023   CHOL 140 03/31/2023   TRIG 174.0 (H) 03/31/2023   HDL 65.10 03/31/2023   LDLDIRECT 100.0 09/02/2018   LDLCALC 40 03/31/2023   ALT 18 03/31/2023   AST 16 03/31/2023   NA 139 03/31/2023   K 4.6 03/31/2023   CL 101 03/31/2023   CREATININE 1.04 03/31/2023   BUN 13 03/31/2023   CO2 29 03/31/2023   TSH 1.66 03/31/2023   PSA 14.10 (H) 03/31/2023   INR 1.0 08/02/2022   HGBA1C 5.4 03/31/2023   Assessment/Plan:  Mark Dunlap is a 62 y.o. Black or African American [2] male with  has a past medical history of Aortic atherosclerosis (HCC), Chronic back  pain, Depression, GERD (gastroesophageal reflux disease), Glaucoma, both eyes, H/O: substance abuse (HCC), History of CVA (cerebrovascular accident) without residual deficits (10/26/2018), History of hyperthyroidism (2011), History of prostatitis (2010), HLD (hyperlipidemia), Hypertension, Legally blind in left eye, as defined in [place], Lumbar radiculopathy, Lumbar radiculopathy, OA (osteoarthritis), Prostate cancer (HCC) (urologist--- dr Marlou Porch), Right thyroid nodule, Seasonal allergies, Spondylolisthesis, lumbar region, Spondylolisthesis, lumbar region, Unspecified fracture of unspecified lumbar vertebra, subsequent encounter for fracture with nonunion, Wears dentures, and Wears glasses.  Anxiety and depression Mild worsening situational recently with girlfriend - pt plans to consider counseling but not at the moment, to restart celexa 20 mg qd  Encounter for well adult exam with abnormal findings Age and sex appropriate education and counseling updated with regular exercise and diet Referrals for preventative services - none needed Immunizations addressed - for shingrix at pharmacy, declines covid booster Smoking counseling  - pt counsled to quit, pt not ready Evidence for depression or other mood disorder - increased anxiety recenlty - to restart celexa 20 qd Most recent labs reviewed. I have personally reviewed and have noted: 1) the patient's medical and social history 2) The patient's current medications and supplements 3) The patient's height, weight, and BMI have been recorded in the chart   Malignant neoplasm of prostate (HCC) Stable, for f/u psa with labs  Essential hypertension BP Readings from Last 3 Encounters:  03/31/23 122/74  08/15/22 118/64  08/02/22 120/73   Stable, pt to continue medical treatment hct 12.5 qd   Hyperlipidemia Lab Results  Component Value Date   LDLCALC 40 03/31/2023   Stable, pt to continue current statin lipitor 40 mg qd  Smoker Pt counsled  to quit, pt not ready  Aortic atherosclerosis (HCC) Pt to continue low chol diet, exercise, lipitro 40 qd  Hyperglycemia Lab Results  Component Value Date   HGBA1C 5.4 03/31/2023   Stable, pt to continue current medical treatment  - diet, wt control   Vitamin D deficiency Last vitamin D Lab Results  Component Value Date   VD25OH 40.88 03/31/2023   Stable, cont oral replacement   Iron deficiency anemia Lab Results  Component Value Date   WBC 7.3 03/31/2023   HGB 12.8 (L) 03/31/2023   HCT 41.2 03/31/2023   MCV 78.9 03/31/2023   PLT 566.0 (H) 03/31/2023  For f/u iron level with lab  Followup: Return in about 6 months (around 09/30/2023).  Oliver Barre, MD 04/03/2023 8:56 PM [facility] Internal Medicine

## 2023-03-31 NOTE — Patient Instructions (Addendum)

## 2023-03-31 NOTE — Assessment & Plan Note (Signed)
Mild worsening situational recently with girlfriend - pt plans to consider counseling but not at the moment, to restart celexa 20 mg qd

## 2023-04-01 ENCOUNTER — Other Ambulatory Visit: Payer: Self-pay | Admitting: Internal Medicine

## 2023-04-01 LAB — URINALYSIS, ROUTINE W REFLEX MICROSCOPIC
Bilirubin Urine: NEGATIVE
Hgb urine dipstick: NEGATIVE
Ketones, ur: NEGATIVE
Leukocytes,Ua: NEGATIVE
Nitrite: NEGATIVE
RBC / HPF: NONE SEEN (ref 0–?)
Specific Gravity, Urine: 1.02 (ref 1.000–1.030)
Total Protein, Urine: NEGATIVE
Urine Glucose: NEGATIVE
Urobilinogen, UA: 0.2 (ref 0.0–1.0)
pH: 6.5 (ref 5.0–8.0)

## 2023-04-01 MED ORDER — POLYSACCHARIDE IRON COMPLEX 150 MG PO CAPS: 150.0000 mg | ORAL_CAPSULE | Freq: Every day | ORAL | 3 refills | Status: DC

## 2023-04-03 ENCOUNTER — Encounter: Payer: Self-pay | Admitting: Internal Medicine

## 2023-04-03 ENCOUNTER — Ambulatory Visit
Admission: RE | Admit: 2023-04-03 | Discharge: 2023-04-03 | Disposition: A | Discharge status: Home / Self Care | Payer: Medicare Other | Source: Ambulatory Visit | Attending: Neurological Surgery | Admitting: Neurological Surgery

## 2023-04-03 DIAGNOSIS — S32009A Unspecified fracture of unspecified lumbar vertebra, initial encounter for closed fracture: Secondary | ICD-10-CM

## 2023-04-03 NOTE — Assessment & Plan Note (Signed)
Age and sex appropriate education and counseling updated with regular exercise and diet Referrals for preventative services - none needed Immunizations addressed - for shingrix at pharmacy, declines covid booster Smoking counseling  - pt counsled to quit, pt not ready Evidence for depression or other mood disorder - increased anxiety recenlty - to restart celexa 20 qd Most recent labs reviewed. I have personally reviewed and have noted: 1) the patient's medical and social history 2) The patient's current medications and supplements 3) The patient's height, weight, and BMI have been recorded in the chart

## 2023-04-03 NOTE — Assessment & Plan Note (Signed)
Lab Results  Component Value Date   HGBA1C 5.4 03/31/2023   Stable, pt to continue current medical treatment  - diet, wt control

## 2023-04-03 NOTE — Assessment & Plan Note (Signed)
Pt counsled to quit, pt not ready °

## 2023-04-03 NOTE — Assessment & Plan Note (Signed)
Pt to continue low chol diet, exercise, lipitro 40 qd

## 2023-04-03 NOTE — Assessment & Plan Note (Signed)
Stable, for f/u psa with labs

## 2023-04-03 NOTE — Assessment & Plan Note (Signed)
Last vitamin D Lab Results  Component Value Date   VD25OH 40.88 03/31/2023   Stable, cont oral replacement

## 2023-04-03 NOTE — Assessment & Plan Note (Signed)
Lab Results  Component Value Date   WBC 7.3 03/31/2023   HGB 12.8 (L) 03/31/2023   HCT 41.2 03/31/2023   MCV 78.9 03/31/2023   PLT 566.0 (H) 03/31/2023  For f/u iron level with lab

## 2023-04-03 NOTE — Assessment & Plan Note (Signed)
Lab Results  Component Value Date   LDLCALC 40 03/31/2023   Stable, pt to continue current statin lipitor 40 mg qd

## 2023-04-03 NOTE — Assessment & Plan Note (Signed)
BP Readings from Last 3 Encounters:  03/31/23 122/74  08/15/22 118/64  08/02/22 120/73   Stable, pt to continue medical treatment hct 12.5 qd

## 2023-04-23 ENCOUNTER — Telehealth: Payer: Self-pay | Admitting: Internal Medicine

## 2023-04-23 NOTE — Telephone Encounter (Signed)
We have received cardiac clearance PW from [facility] and it has been placed in provider's box.   Once completed please fax to: [phone]

## 2023-04-23 NOTE — Telephone Encounter (Signed)
Form placed on providers desk 

## 2023-04-25 NOTE — Telephone Encounter (Signed)
Form Faxed--

## 2023-05-29 ENCOUNTER — Encounter (INDEPENDENT_AMBULATORY_CARE_PROVIDER_SITE_OTHER): Payer: Self-pay

## 2023-07-01 ENCOUNTER — Other Ambulatory Visit: Payer: Self-pay | Admitting: Internal Medicine

## 2023-07-02 ENCOUNTER — Telehealth: Payer: Self-pay | Admitting: Internal Medicine

## 2023-07-03 ENCOUNTER — Ambulatory Visit: Payer: Medicare Other | Admitting: Internal Medicine

## 2023-07-03 ENCOUNTER — Encounter: Payer: Self-pay | Admitting: Internal Medicine

## 2023-07-03 VITALS — BP 128/70 | HR 70 | Temp 98.3°F | Ht 72.0 in | Wt 156.0 lb

## 2023-07-03 DIAGNOSIS — Z23 Encounter for immunization: Secondary | ICD-10-CM | POA: Diagnosis not present

## 2023-07-03 DIAGNOSIS — M67432 Ganglion, left wrist: Secondary | ICD-10-CM | POA: Diagnosis not present

## 2023-07-03 DIAGNOSIS — E559 Vitamin D deficiency, unspecified: Secondary | ICD-10-CM

## 2023-07-03 DIAGNOSIS — R739 Hyperglycemia, unspecified: Secondary | ICD-10-CM

## 2023-07-03 DIAGNOSIS — R972 Elevated prostate specific antigen [PSA]: Secondary | ICD-10-CM

## 2023-07-03 DIAGNOSIS — F17200 Nicotine dependence, unspecified, uncomplicated: Secondary | ICD-10-CM

## 2023-07-03 DIAGNOSIS — D509 Iron deficiency anemia, unspecified: Secondary | ICD-10-CM

## 2023-07-03 DIAGNOSIS — E7800 Pure hypercholesterolemia, unspecified: Secondary | ICD-10-CM

## 2023-07-03 LAB — CBC WITH DIFFERENTIAL/PLATELET
Basophils Absolute: 0.1 10*3/uL (ref 0.0–0.1)
Basophils Relative: 1.2 % (ref 0.0–3.0)
Eosinophils Absolute: 0.3 10*3/uL (ref 0.0–0.7)
Eosinophils Relative: 4 % (ref 0.0–5.0)
HCT: 40.1 % (ref 39.0–52.0)
Hemoglobin: 12.7 g/dL — ABNORMAL LOW (ref 13.0–17.0)
Lymphocytes Relative: 24.3 % (ref 12.0–46.0)
Lymphs Abs: 1.7 10*3/uL (ref 0.7–4.0)
MCHC: 31.7 g/dL (ref 30.0–36.0)
MCV: 86.1 fl (ref 78.0–100.0)
Monocytes Absolute: 0.7 10*3/uL (ref 0.1–1.0)
Monocytes Relative: 10.7 % (ref 3.0–12.0)
Neutro Abs: 4.2 10*3/uL (ref 1.4–7.7)
Neutrophils Relative %: 59.8 % (ref 43.0–77.0)
Platelets: 328 10*3/uL (ref 150.0–400.0)
RBC: 4.65 Mil/uL (ref 4.22–5.81)
RDW: 19.7 % — ABNORMAL HIGH (ref 11.5–15.5)
WBC: 7 10*3/uL (ref 4.0–10.5)

## 2023-07-03 LAB — FERRITIN: Ferritin: 14.4 ng/mL — ABNORMAL LOW (ref 22.0–322.0)

## 2023-07-03 LAB — IBC PANEL
Iron: 46 ug/dL (ref 42–165)
Saturation Ratios: 9.6 % — ABNORMAL LOW (ref 20.0–50.0)
TIBC: 481.6 ug/dL — ABNORMAL HIGH (ref 250.0–450.0)
Transferrin: 344 mg/dL (ref 212.0–360.0)

## 2023-07-03 LAB — PSA: PSA: 11.44 ng/mL — ABNORMAL HIGH (ref 0.10–4.00)

## 2023-07-03 MED ORDER — IMIQUIMOD 5 % EX CREA: TOPICAL_CREAM | CUTANEOUS | 2 refills | Status: DC

## 2023-07-03 NOTE — Progress Notes (Signed)
Patient ID: Mark Dunlap, male   DOB: Sep 12, 1961, 62 y.o.   MRN: [id number]        Chief Complaint: follow up left wrist ganglion cyst, elevated psa, iron deeficiency,        HPI:  Mark Dunlap is a 62 y.o. male here with c/o 2 wks onset left wrist pain and swelling with a knot with overuse recent.  Pt denies chest pain, increased sob or doe, wheezing, orthopnea, PND, increased LE swelling, palpitations, dizziness or syncope.   Pt denies polydipsia, polyuria, or new focal neuro s/s.    Pt denies fever, wt loss, night sweats, loss of appetite, or other constitutional symptoms  Denies urinary symptoms such as dysuria, frequency, urgency, flank pain, hematuria or n/v, fever, chills.  No recent overt bleeding, bruising.  Still smoking, not ready to quit  Has seen urology with improving psa, pt advised to continue to monitor       Wt Readings from Last 3 Encounters:  07/04/23 156 lb (70.8 kg)  07/03/23 156 lb (70.8 kg)  03/31/23 153 lb (69.4 kg)   BP Readings from Last 3 Encounters:  07/03/23 128/70  03/31/23 122/74  08/15/22 118/64         Past Medical History:  Diagnosis Date   Aortic atherosclerosis (HCC)    Chronic back pain    upper to lower   Depression    GERD (gastroesophageal reflux disease)    Glaucoma, both eyes    H/O: substance abuse (HCC)    03-21-2021   pt stated hx of ETOH/Crack cocaine-none since 12/2012 per pt/Does not Drive due to this   History of CVA (cerebrovascular accident) without residual deficits 10/26/2018   right BG/ CR infarct secondart to moderate SVD,  no residual   History of hyperthyroidism 2011   s/p RAI  06-20-2010,  followed by pcp   History of prostatitis 2010   HLD (hyperlipidemia)    Hypertension    followed by pcp    (nuclear stress test in epic 08-30-2013  normal no ischemia , ef 50%)   Legally blind in left eye, as defined in [place]    Blind Left eye, small amt vision Right eye   Lumbar radiculopathy    Lumbar radiculopathy    OA  (osteoarthritis)    Prostate cancer [facility]) urologist--- dr Mark Dunlap   dx 11-21-2020, Stage T1c, Gleason 4+3   Right thyroid nodule    Seasonal allergies    Spondylolisthesis, lumbar region    Spondylolisthesis, lumbar region    Unspecified fracture of unspecified lumbar vertebra, subsequent encounter for fracture with nonunion    Wears dentures    upper full and lower partial   Wears glasses    Past Surgical History:  Procedure Laterality Date   ABDOMINAL EXPOSURE N/A 08/14/2022   Procedure: ABDOMINAL EXPOSURE;  Surgeon: Mark Shelling, MD;  Location: [facility] OR;  Service: Vascular;  Laterality: N/A;   ANTERIOR CERVICAL DECOMP/DISCECTOMY FUSION  10/11/2011   Procedure: ANTERIOR CERVICAL DECOMPRESSION/DISCECTOMY FUSION 3 LEVELS;  Surgeon: Mark Dunlap;  Location: [facility];  Service: Neurosurgery;  Laterality: N/A;  Cervical three-four ,cervical four five cervical five six Anterior Cervical Decompression Fusion with peek + plate Nuvasive translational plate Orthofix peek (2 1/2 hours) Rm # [id number]   ANTERIOR CERVICAL DECOMP/DISCECTOMY FUSION N/A 07/29/2014   Procedure: ANTERIOR CERVICAL DECOMPRESSION/DISCECTOMY FUSION 1 LEVEL/HARDWARE REMOVAL;  Surgeon: Mark Alert, MD;  Location: [facility];  Service: Neurosurgery;  Laterality: N/A;  cervical six-seven  ANTERIOR LUMBAR FUSION N/A 08/14/2022   Procedure: Anterior Lumbar Interbody Fusion - Lumbar Four-Lumbar Five with plate;  Surgeon: Mark Alert, MD;  Location: [facility] OR;  Service: Neurosurgery;  Laterality: N/A;   COLONOSCOPY     COLONOSCOPY  lastone 07-12-2020   CYSTOSCOPY N/A 03/23/2021   Procedure: CYSTOSCOPY FLEXIBLE;  Surgeon: Mark Pippin, MD;  Location: [facility];  Service: Urology;  Laterality: N/A;   HAND SURGERY Right x2  12/ 2007   I & D abscess   HARDWARE REMOVAL N/A 09/11/2020   Procedure: Removal of hardware LUmbar Four-Lumbar Five;  Surgeon: Mark Alert, MD;  Location: [facility] OR;  Service: Neurosurgery;   Laterality: N/A;   LAMINECTOMY WITH POSTERIOR LATERAL ARTHRODESIS LEVEL 2 N/A 09/11/2020   Procedure: Laminectomy and Foraminotomy - Lumbar Three-Lumbar Four, posterior lateral fusion Lumbar Three-Four and Lumbar Four- Five;  Surgeon: Mark Alert, MD;  Location: [facility] OR;  Service: Neurosurgery;  Laterality: N/A;   POSTERIOR CERVICAL FUSION/FORAMINOTOMY N/A 09/23/2013   Procedure: CERVICALTWO TO CERVICAL SEVEN POSTERIOR CERVICAL FUSION/FORAMINOTOMY WITH LATERAL MASS FIXATION;  Surgeon: Mark Alert, MD;  Location: [facility];  Service: Neurosurgery;  Laterality: N/A;   POSTERIOR LUMBAR FUSION  11-27-2017  @[facility]    L4--5   RADIOACTIVE SEED IMPLANT N/A 03/23/2021   Procedure: RADIOACTIVE SEED IMPLANT/BRACHYTHERAPY IMPLANT;  Surgeon: Mark Pippin, MD;  Location: [facility];  Service: Urology;  Laterality: N/A;   SPACE OAR INSTILLATION N/A 03/23/2021   Procedure: SPACE OAR INSTILLATION;  Surgeon: Mark Pippin, MD;  Location: [facility];  Service: Urology;  Laterality: N/A;   TOTAL HIP ARTHROPLASTY  12/17/2011   Procedure: TOTAL HIP ARTHROPLASTY;  Surgeon: Mark Post, MD;  Location: [facility] OR;  Service: Orthopedics;  Laterality: Left;    reports that he has been smoking cigarettes. He has a 47 pack-year smoking history. He has never used smokeless tobacco. He reports that he does not currently use drugs after having used the following drugs: "Crack" cocaine. He reports that he does not drink alcohol. family history includes Alcohol abuse in his brother, cousin, and father; Arthritis in his mother; Cancer in his brother and sister; Diabetes in his brother and sister; Goiter in his mother and sister; Heart disease in his father and another family member; Hyperlipidemia in his mother; Hypertension in his sister; Stroke in his father. Allergies  Allergen Reactions   Tramadol Itching   Morphine And Codeine Itching   Oxycodone Itching    Tolerates with benadryl    Current  Outpatient Medications on File Prior to Visit  Medication Sig Dispense Refill   adapalene (DIFFERIN) 0.1 % gel Apply 1 Application topically at bedtime. 45 g 1   aspirin EC 81 MG tablet Take 81 mg by mouth daily. Swallow whole.     atorvastatin (LIPITOR) 40 MG tablet Take 1 tablet (40 mg total) by mouth daily. Annual appt is due w/labs must see provider for future refills 90 tablet 3   benzoyl peroxide 10 % LIQD Apply 1 Application topically daily.     bimatoprost (LUMIGAN) 0.01 % SOLN Place 1 drop into both eyes at bedtime.     citalopram (CELEXA) 20 MG tablet Take 1 tablet (20 mg total) by mouth daily. 90 tablet 3   clopidogrel (PLAVIX) 75 MG tablet Take 1 tablet (75 mg total) by mouth daily. 90 tablet 3   diphenhydrAMINE (BENADRYL) 25 mg capsule Take 50 mg by mouth every 6 (six) hours as needed for itching.  dorzolamide-timolol (COSOPT) 2-0.5 % ophthalmic solution 1 drop 2 (two) times daily.     famotidine (PEPCID) 40 MG tablet Take 1 tablet (40 mg total) by mouth 2 (two) times daily. Take every morning and at bedtime 180 tablet 3   fluticasone (FLONASE) 50 MCG/ACT nasal spray USE 2 SPRAYS DAILY IN BOTH NOSTRILS 32 mL 0   gabapentin (NEURONTIN) 100 MG capsule Take 2 capsules (200 mg total) by mouth 2 (two) times daily. 360 capsule 1   hydrochlorothiazide (MICROZIDE) 12.5 MG capsule TAKE 1 CAPSULE BY MOUTH EVERY DAY 90 capsule 3   ibuprofen (ADVIL) 200 MG tablet Take 400 mg by mouth every 6 (six) hours as needed for headache or moderate pain.     iron polysaccharides (NU-IRON) 150 MG capsule Take 1 capsule (150 mg total) by mouth daily. 90 capsule 3   Multiple Vitamins-Minerals (ONE DAILY FOR MEN/LYCOPENE) TABS Take 1 tablet by mouth daily.     naloxone (NARCAN) nasal spray 4 mg/0.1 mL Place 1 spray into the nose as needed (opioid overdose).     nortriptyline (PAMELOR) 25 MG capsule Take 1 capsule (25 mg total) by mouth at bedtime. 90 capsule 1   omeprazole (PRILOSEC OTC) 20 MG tablet Take  20 mg by mouth daily.     oxybutynin (DITROPAN) 5 MG tablet Take 1 tablet (5 mg total) by mouth at bedtime. 90 tablet 3   oxyCODONE-acetaminophen (PERCOCET) 10-325 MG tablet Take 1 tablet by mouth every 4 (four) hours as needed for pain. 120 tablet 0   pantoprazole (PROTONIX) 40 MG tablet Take 1 tablet (40 mg total) by mouth daily. 90 tablet 3   sildenafil (VIAGRA) 100 MG tablet Take 1 tablet (100 mg total) by mouth as needed for erectile dysfunction. 10 tablet 11   tamsulosin (FLOMAX) 0.4 MG CAPS capsule Take 1 capsule (0.4 mg total) by mouth 2 (two) times daily. 180 capsule 3   timolol (TIMOPTIC) 0.5 % ophthalmic solution Place 1 drop into both eyes daily.  6   tiZANidine (ZANAFLEX) 4 MG tablet Take 4 mg by mouth every 8 (eight) hours as needed for muscle spasms.      tretinoin (RETIN-A) 0.1 % cream Apply 1 application  topically at bedtime. 45 g 2   zolpidem (AMBIEN) 10 MG tablet Take 1 tablet (10 mg total) by mouth at bedtime as needed. for sleep 90 tablet 1   No current facility-administered medications on file prior to visit.        ROS:  All others reviewed and negative.  Objective        PE:  BP 128/70 (BP Location: Left Arm, Patient Position: Sitting, Cuff Size: Normal)   Pulse 70   Temp 98.3 F (36.8 C) (Oral)   Ht 6' (1.829 m)   Wt 156 lb (70.8 kg)   SpO2 99%   BMI 21.16 kg/m                 Constitutional: Pt appears in NAD               HENT: Head: NCAT.                Right Ear: External ear normal.                 Left Ear: External ear normal.                Eyes: . Pupils are equal, round, and reactive to light. Conjunctivae and EOM are normal  Nose: without d/c or deformity               Neck: Neck supple. Gross normal ROM               Cardiovascular: Normal rate and regular rhythm.                 Pulmonary/Chest: Effort normal and breath sounds without rales or wheezing.                Abd:  Soft, NT, ND, + BS, no organomegaly; left wrist with let  wrist first dorsal compartment ganlgion cyst tender 1/2 cm size raised               Neurological: Pt is Dunlap. At baseline orientation, motor grossly intact               Skin: Skin is warm. No rashes, no other new lesions, LE edema - none               Psychiatric: Pt behavior is normal without agitation   Micro: none  Cardiac tracings I have personally interpreted today:  none  Pertinent Radiological findings (summarize): none   Lab Results  Component Value Date   WBC 7.0 07/03/2023   HGB 12.7 (L) 07/03/2023   HCT 40.1 07/03/2023   PLT 328.0 07/03/2023   GLUCOSE 111 (H) 03/31/2023   CHOL 140 03/31/2023   TRIG 174.0 (H) 03/31/2023   HDL 65.10 03/31/2023   LDLDIRECT 100.0 09/02/2018   LDLCALC 40 03/31/2023   ALT 18 03/31/2023   AST 16 03/31/2023   NA 139 03/31/2023   K 4.6 03/31/2023   CL 101 03/31/2023   CREATININE 1.04 03/31/2023   BUN 13 03/31/2023   CO2 29 03/31/2023   TSH 1.66 03/31/2023   PSA 11.44 (H) 07/03/2023   INR 1.0 08/02/2022   HGBA1C 5.4 03/31/2023   Assessment/Plan:  Mark Dunlap is a 62 y.o. Black or African American [2] male with  has a past medical history of Aortic atherosclerosis (HCC), Chronic back pain, Depression, GERD (gastroesophageal reflux disease), Glaucoma, both eyes, H/O: substance abuse (HCC), History of CVA (cerebrovascular accident) without residual deficits (10/26/2018), History of hyperthyroidism (2011), History of prostatitis (2010), HLD (hyperlipidemia), Hypertension, Legally blind in left eye, as defined in [place], Lumbar radiculopathy, Lumbar radiculopathy, OA (osteoarthritis), Prostate cancer (HCC) (urologist--- dr Mark Dunlap), Right thyroid nodule, Seasonal allergies, Spondylolisthesis, lumbar region, Spondylolisthesis, lumbar region, Unspecified fracture of unspecified lumbar vertebra, subsequent encounter for fracture with nonunion, Wears dentures, and Wears glasses.  Hyperglycemia Lab Results  Component Value Date   HGBA1C 5.4  03/31/2023   Stable, pt to continue current medical treatment  - diet, wt control   Hyperlipidemia Lab Results  Component Value Date   LDLCALC 40 03/31/2023   Stable, pt to continue current statin lipitor 40 qd   Iron deficiency anemia Lab Results  Component Value Date   WBC 7.0 07/03/2023   HGB 12.7 (L) 07/03/2023   HCT 40.1 07/03/2023   MCV 86.1 07/03/2023   PLT 328.0 07/03/2023  Mild, for f/u liron lab  Smoker Pt counsled to quit, pt not ready  Vitamin D deficiency Last vitamin D Lab Results  Component Value Date   VD25OH 40.88 03/31/2023   Stable, cont oral replacement   Elevated PSA Asympt, stable improved, cont to follow  Lab Results  Component Value Date   PSA 11.44 (H) 07/03/2023   PSA 14.10 (H) 03/31/2023   PSA 4.2 (  H) 05/02/2020      Ganglion cyst of wrist, left Mild to mod, for refer hand surgury,  to f/u any worsening symptoms or concerns  Followup: Return in about 6 months (around 12/31/2023).  Oliver Barre, MD 07/05/2023 2:40 PM [facility]

## 2023-07-03 NOTE — Patient Instructions (Addendum)
Please continue all other medications as before, and refills have been done for the [place]  Please have the pharmacy call with any other refills you may need.  Please continue your efforts at being more active, low cholesterol diet, and weight control.  Please keep your appointments with your specialists as you may have planned  You will be contacted regarding the referral for: Dr Amanda Pea - hand surgury  Please go to the LAB at the blood drawing area for the tests to be done  You will be contacted by phone if any changes need to be made immediately.  Otherwise, you will receive a letter about your results with an explanation, but please check with MyChart first.  Please make an Appointment to return in 6 months, or sooner if needed

## 2023-07-04 ENCOUNTER — Ambulatory Visit (INDEPENDENT_AMBULATORY_CARE_PROVIDER_SITE_OTHER): Payer: Medicare Other

## 2023-07-04 VITALS — Ht 72.0 in | Wt 156.0 lb

## 2023-07-04 DIAGNOSIS — Z0000 Encounter for general adult medical examination without abnormal findings: Secondary | ICD-10-CM | POA: Diagnosis not present

## 2023-07-04 NOTE — Progress Notes (Signed)
Subjective:   Mark Dunlap is a 62 y.o. male who presents for Medicare Annual/Subsequent preventive examination.  Visit Complete: Virtual  I connected with  Mark Dunlap on 07/04/23 by a audio enabled telemedicine application and verified that I am speaking with the correct person using two identifiers.  Patient Location: Other:  Work  Dispensing optician: Office/Clinic  I discussed the limitations of evaluation and management by telemedicine. The patient expressed understanding and agreed to proceed.  Vital Signs: Because this visit was a virtual/telehealth visit, some criteria may be missing or patient reported. Any vitals not documented were not able to be obtained and vitals that have been documented are patient reported.    Cardiac Risk Factors include: advanced age (>57men, >62 women);male gender;hypertension;Other (see comment), Risk factor comments: TIA, CVA, Aortic atherosclerosis, Acute Stroke     Objective:    Today's Vitals   07/04/23 0950 07/04/23 0951  Weight: 156 lb (70.8 kg)   Height: 6' (1.829 m)   PainSc:  7    Body mass index is 21.16 kg/m.     07/04/2023    9:58 AM 08/14/2022    7:49 AM 08/02/2022    9:20 AM 07/02/2022   10:30 AM 05/16/2021    3:46 PM 03/23/2021   10:40 AM 12/12/2020    8:35 AM  Advanced Directives  Does Patient Have a Medical Advance Directive? No No No No No No No  Would patient like information on creating a medical advance directive?   No - Patient declined No - Patient declined No - Patient declined No - Patient declined No - Patient declined    Current Medications (verified) Outpatient Encounter Medications as of 07/04/2023  Medication Sig   adapalene (DIFFERIN) 0.1 % gel Apply 1 Application topically at bedtime.   aspirin EC 81 MG tablet Take 81 mg by mouth daily. Swallow whole.   atorvastatin (LIPITOR) 40 MG tablet Take 1 tablet (40 mg total) by mouth daily. Annual appt is due w/labs must see provider for future refills    benzoyl peroxide 10 % LIQD Apply 1 Application topically daily.   bimatoprost (LUMIGAN) 0.01 % SOLN Place 1 drop into both eyes at bedtime.   citalopram (CELEXA) 20 MG tablet Take 1 tablet (20 mg total) by mouth daily.   clopidogrel (PLAVIX) 75 MG tablet Take 1 tablet (75 mg total) by mouth daily.   diphenhydrAMINE (BENADRYL) 25 mg capsule Take 50 mg by mouth every 6 (six) hours as needed for itching.    dorzolamide-timolol (COSOPT) 2-0.5 % ophthalmic solution 1 drop 2 (two) times daily.   famotidine (PEPCID) 40 MG tablet Take 1 tablet (40 mg total) by mouth 2 (two) times daily. Take every morning and at bedtime   fluticasone (FLONASE) 50 MCG/ACT nasal spray USE 2 SPRAYS DAILY IN BOTH NOSTRILS   gabapentin (NEURONTIN) 100 MG capsule Take 2 capsules (200 mg total) by mouth 2 (two) times daily.   hydrochlorothiazide (MICROZIDE) 12.5 MG capsule TAKE 1 CAPSULE BY MOUTH EVERY DAY   ibuprofen (ADVIL) 200 MG tablet Take 400 mg by mouth every 6 (six) hours as needed for headache or moderate pain.   imiquimod (ALDARA) 5 % cream Apply topically 3 (three) times a week.   iron polysaccharides (NU-IRON) 150 MG capsule Take 1 capsule (150 mg total) by mouth daily.   Multiple Vitamins-Minerals (ONE DAILY FOR MEN/LYCOPENE) TABS Take 1 tablet by mouth daily.   naloxone (NARCAN) nasal spray 4 mg/0.1 mL Place 1 spray into the  nose as needed (opioid overdose).   nortriptyline (PAMELOR) 25 MG capsule Take 1 capsule (25 mg total) by mouth at bedtime.   omeprazole (PRILOSEC OTC) 20 MG tablet Take 20 mg by mouth daily.   oxybutynin (DITROPAN) 5 MG tablet Take 1 tablet (5 mg total) by mouth at bedtime.   oxyCODONE-acetaminophen (PERCOCET) 10-325 MG tablet Take 1 tablet by mouth every 4 (four) hours as needed for pain.   pantoprazole (PROTONIX) 40 MG tablet Take 1 tablet (40 mg total) by mouth daily.   sildenafil (VIAGRA) 100 MG tablet Take 1 tablet (100 mg total) by mouth as needed for erectile dysfunction.    tamsulosin (FLOMAX) 0.4 MG CAPS capsule Take 1 capsule (0.4 mg total) by mouth 2 (two) times daily.   timolol (TIMOPTIC) 0.5 % ophthalmic solution Place 1 drop into both eyes daily.   tiZANidine (ZANAFLEX) 4 MG tablet Take 4 mg by mouth every 8 (eight) hours as needed for muscle spasms.    tretinoin (RETIN-A) 0.1 % cream Apply 1 application  topically at bedtime.   zolpidem (AMBIEN) 10 MG tablet Take 1 tablet (10 mg total) by mouth at bedtime as needed. for sleep   No facility-administered encounter medications on file as of 07/04/2023.    Allergies (verified) Tramadol, Morphine and codeine, and Oxycodone   History: Past Medical History:  Diagnosis Date   Aortic atherosclerosis (HCC)    Chronic back pain    upper to lower   Depression    GERD (gastroesophageal reflux disease)    Glaucoma, both eyes    H/O: substance abuse (HCC)    03-21-2021   pt stated hx of ETOH/Crack cocaine-none since 12/2012 per pt/Does not Drive due to this   History of CVA (cerebrovascular accident) without residual deficits 10/26/2018   right BG/ CR infarct secondart to moderate SVD,  no residual   History of hyperthyroidism 2011   s/p RAI  06-20-2010,  followed by pcp   History of prostatitis 2010   HLD (hyperlipidemia)    Hypertension    followed by pcp    (nuclear stress test in epic 08-30-2013  normal no ischemia , ef 50%)   Legally blind in left eye, as defined in [place]    Blind Left eye, small amt vision Right eye   Lumbar radiculopathy    Lumbar radiculopathy    OA (osteoarthritis)    Prostate cancer [facility]) urologist--- dr Mark Dunlap   dx 11-21-2020, Stage T1c, Gleason 4+3   Right thyroid nodule    Seasonal allergies    Spondylolisthesis, lumbar region    Spondylolisthesis, lumbar region    Unspecified fracture of unspecified lumbar vertebra, subsequent encounter for fracture with nonunion    Wears dentures    upper full and lower partial   Wears glasses    Past Surgical History:  Procedure  Laterality Date   ABDOMINAL EXPOSURE N/A 08/14/2022   Procedure: ABDOMINAL EXPOSURE;  Surgeon: Cephus Shelling, MD;  Location: [facility] OR;  Service: Vascular;  Laterality: N/A;   ANTERIOR CERVICAL DECOMP/DISCECTOMY FUSION  10/11/2011   Procedure: ANTERIOR CERVICAL DECOMPRESSION/DISCECTOMY FUSION 3 LEVELS;  Surgeon: Tia Alert;  Location: [facility];  Service: Neurosurgery;  Laterality: N/A;  Cervical three-four ,cervical four five cervical five six Anterior Cervical Decompression Fusion with peek + plate Nuvasive translational plate Orthofix peek (2 1/2 hours) Rm # [id number]   ANTERIOR CERVICAL DECOMP/DISCECTOMY FUSION N/A 07/29/2014   Procedure: ANTERIOR CERVICAL DECOMPRESSION/DISCECTOMY FUSION 1 LEVEL/HARDWARE REMOVAL;  Surgeon: Tia Alert, MD;  Location: [facility];  Service: Neurosurgery;  Laterality: N/A;  cervical six-seven   ANTERIOR LUMBAR FUSION N/A 08/14/2022   Procedure: Anterior Lumbar Interbody Fusion - Lumbar Four-Lumbar Five with plate;  Surgeon: Tia Alert, MD;  Location: [facility] OR;  Service: Neurosurgery;  Laterality: N/A;   COLONOSCOPY     COLONOSCOPY  lastone 07-12-2020   CYSTOSCOPY N/A 03/23/2021   Procedure: CYSTOSCOPY FLEXIBLE;  Surgeon: Bjorn Pippin, MD;  Location: [facility];  Service: Urology;  Laterality: N/A;   HAND SURGERY Right x2  12/ 2007   I & D abscess   HARDWARE REMOVAL N/A 09/11/2020   Procedure: Removal of hardware LUmbar Four-Lumbar Five;  Surgeon: Tia Alert, MD;  Location: [facility] OR;  Service: Neurosurgery;  Laterality: N/A;   LAMINECTOMY WITH POSTERIOR LATERAL ARTHRODESIS LEVEL 2 N/A 09/11/2020   Procedure: Laminectomy and Foraminotomy - Lumbar Three-Lumbar Four, posterior lateral fusion Lumbar Three-Four and Lumbar Four- Five;  Surgeon: Tia Alert, MD;  Location: [facility] OR;  Service: Neurosurgery;  Laterality: N/A;   POSTERIOR CERVICAL FUSION/FORAMINOTOMY N/A 09/23/2013   Procedure: CERVICALTWO TO CERVICAL SEVEN POSTERIOR CERVICAL  FUSION/FORAMINOTOMY WITH LATERAL MASS FIXATION;  Surgeon: Tia Alert, MD;  Location: [facility];  Service: Neurosurgery;  Laterality: N/A;   POSTERIOR LUMBAR FUSION  11-27-2017  @[facility]    L4--5   RADIOACTIVE SEED IMPLANT N/A 03/23/2021   Procedure: RADIOACTIVE SEED IMPLANT/BRACHYTHERAPY IMPLANT;  Surgeon: Bjorn Pippin, MD;  Location: [facility];  Service: Urology;  Laterality: N/A;   SPACE OAR INSTILLATION N/A 03/23/2021   Procedure: SPACE OAR INSTILLATION;  Surgeon: Bjorn Pippin, MD;  Location[facility];  Service: Urology;  Laterality: N/A;   TOTAL HIP ARTHROPLASTY  12/17/2011   Procedure: TOTAL HIP ARTHROPLASTY;  Surgeon: Eulas Post, MD;  Location: [facility] OR;  Service: Orthopedics;  Laterality: Left;   Family History  Problem Relation Age of Onset   Alcohol abuse Father    Stroke Father    Heart disease Father    Arthritis Mother    Hyperlipidemia Mother    Goiter Mother        resection of benign goiter   Hypertension Sister    Diabetes Sister    Goiter Sister        resection of benign goiter   Cancer Sister        oldest.type unknown.   Diabetes Brother    Cancer Brother        oldest.type unknown.   Alcohol abuse Brother    Alcohol abuse Cousin    Heart disease Other        Aunt   Colon cancer Neg Hx    Colon polyps Neg Hx    Esophageal cancer Neg Hx    Rectal cancer Neg Hx    Stomach cancer Neg Hx    Breast cancer Neg Hx    Prostate cancer Neg Hx    Pancreatic cancer Neg Hx    Social History   Socioeconomic History   Marital status: Single    Spouse name: Not on file   Number of children: 0   Years of education: 12   Highest education level: 12th grade  Occupational History   Occupation: [employer]  Tobacco Use   Smoking status: Every Day    Current packs/day: 1.00    Average packs/day: 1 pack/day for 47.0 years (47.0 ttl pk-yrs)    Types: Cigarettes   Smokeless tobacco: Never   Tobacco  comments:    since age  57  Vaping Use   Vaping status: Never Used  Substance and Sexual Activity   Alcohol use: No    Alcohol/week: 0.0 standard drinks of alcohol    Comment: 03-21-2021 pt stated hx of alcohol abuse quit 2014   Drug use: Not Currently    Types: "Crack" cocaine    Comment: 03-21-2021 pt stated last use 12/2012   Sexual activity: Not Currently  Other Topics Concern   Not on file  Social History Narrative   Lives alone.   Social Determinants of Health   Financial Resource Strain: Low Risk  (07/04/2023)   Overall Financial Resource Strain (CARDIA)    Difficulty of Paying Living Expenses: Not very hard  Food Insecurity: No Food Insecurity (07/04/2023)   Hunger Vital Sign    Worried About Running Out of Food in the Last Year: Never true    Ran Out of Food in the Last Year: Never true  Transportation Needs: No Transportation Needs (07/04/2023)   PRAPARE - Administrator, Civil Service (Medical): No    Lack of Transportation (Non-Medical): No  Physical Activity: Inactive (07/04/2023)   Exercise Vital Sign    Days of Exercise per Week: 0 days    Minutes of Exercise per Session: 0 min  Stress: No Stress Concern Present (07/04/2023)   [employer] of Occupational Health - Occupational Stress Questionnaire    Feeling of Stress : Not at all  Social Connections: Moderately Isolated (07/04/2023)   Social Connection and Isolation Panel [NHANES]    Frequency of Communication with Friends and Family: More than three times a week    Frequency of Social Gatherings with Friends and Family: More than three times a week    Attends Religious Services: More than 4 times per year    Active Member of [employer] or Organizations: No    Attends Engineer, structural: Never    Marital Status: Never married    Tobacco Counseling Ready to quit: Not Answered Counseling given: Not Answered Tobacco comments: since age 29   Clinical Intake:  Pre-visit preparation completed: Yes  Pain :  0-10 Pain Score: 7  Pain Type: Chronic pain Pain Location: Back (Lt hand and wrist) Pain Orientation: Lower Pain Descriptors / Indicators: Aching, Discomfort Pain Onset: More than a month ago Pain Frequency: Constant     BMI - recorded: 21.16 Nutritional Risks: None  How often do you need to have someone help you when you read instructions, pamphlets, or other written materials from your doctor or pharmacy?: 1 - Never  Interpreter Needed?: No  Information entered by :: Tiya Schrupp, RMA   Activities of Daily Living    07/04/2023    9:54 AM 08/02/2022    9:22 AM  In your present state of health, do you have any difficulty performing the following activities:  Hearing? 0   Vision? 0   Difficulty concentrating or making decisions? 0   Walking or climbing stairs? 0   Dressing or bathing? 0   Doing errands, shopping? 0 0  Comment Uses SCAT   Preparing Food and eating ? N   Using the Toilet? N   In the past six months, have you accidently leaked urine? N   Do you have problems with loss of bowel control? N   Managing your Medications? N   Managing your Finances? N   Housekeeping or managing your Housekeeping? N     Patient Care Team: Oliver Barre  W, MD as PCP - General Arman Bogus, MD as Consulting Physician (Neurosurgery) Sallye Lat, MD as Consulting Physician (Ophthalmology) Felicita Gage, RN as Oncology Nurse Navigator Bjorn Pippin, MD as Attending Physician (Urology) Margaretmary Dys, MD as Consulting Physician (Radiation Oncology)  Indicate any recent Medical Services you may have received from other than [facility] providers in the past year (date may be approximate).     Assessment:   This is a routine wellness examination for Mark Dunlap.  Hearing/Vision screen Hearing Screening - Comments:: Denies hearing difficulties   Vision Screening - Comments:: Wears eyeglasses/ blind in Lt eye   Goals Addressed             This Visit's Progress    Patient  Stated   Not on track    I want to quit smoking, decrease the amount of cigarettes I smoke daily. Limit how many packs of cigarettes I buy at the same time. Think about going to [facility] smoking cessation classes. Increase my physical activity by starting to walk daily.      Depression Screen    07/04/2023   10:06 AM 07/03/2023    1:09 PM 03/31/2023    8:21 AM 07/02/2022   10:29 AM 12/07/2021    4:28 PM 05/16/2021    4:13 PM 10/31/2020    3:50 PM  PHQ 2/9 Scores  PHQ - 2 Score 0 0 0 1 0 1 0  PHQ- 9 Score 2          Fall Risk    07/04/2023    9:58 AM 07/03/2023    1:09 PM 03/31/2023    8:21 AM 07/02/2022   10:30 AM 12/07/2021    4:28 PM  Fall Risk   Falls in the past year? 1 0 1 1 0  Number falls in past yr: 1 0 0 1 0  Injury with Fall? 0 0 0 1 0  Risk for fall due to :  No Fall Risks History of fall(s) History of fall(s);Impaired balance/gait;Orthopedic patient   Follow up Falls prevention discussed;Falls evaluation completed Falls evaluation completed Falls evaluation completed Education provided;Falls prevention discussed     MEDICARE RISK AT HOME: Medicare Risk at Home Any stairs in or around the home?: Yes (one step onto deck) If so, are there any without handrails?: No Home free of loose throw rugs in walkways, pet beds, electrical cords, etc?: Yes Adequate lighting in your home to reduce risk of falls?: Yes Life alert?: No Use of a cane, walker or w/c?: No Grab bars in the bathroom?: No Shower chair or bench in shower?: Yes Elevated toilet seat or a handicapped toilet?: No  TIMED UP AND GO:  Was the test performed?  No    Cognitive Function:        07/04/2023   10:01 AM 07/02/2022   10:32 AM  6CIT Screen  What Year? 0 points 0 points  What month? 0 points 0 points  What time? 0 points 0 points  Count back from 20 0 points 0 points  Months in reverse 0 points 0 points  Repeat phrase 0 points 0 points  Total Score 0 points 0 points     Immunizations Immunization History  Administered Date(s) Administered   Influenza Split 08/20/2012   Influenza Whole 08/25/2008   Influenza, Seasonal, Injecte, Preservative Fre 07/03/2023   Influenza,inj,Quad PF,6+ Mos 07/19/2013, 07/06/2014, 08/08/2015, 08/21/2017, 09/07/2018, 07/01/2019, 07/16/2021   Influenza-Unspecified 07/17/2020   Moderna Sars-Covid-2 Vaccination 11/30/2019, 12/29/2019, 08/11/2020   PNEUMOCOCCAL  CONJUGATE-20 07/16/2021   Pneumococcal Polysaccharide-23 07/30/2014, 07/01/2019   Td 09/15/2006   Tdap 08/08/2015    TDAP status: Up to date  Flu Vaccine status: Up to date  Pneumococcal vaccine status: Due, Education has been provided regarding the importance of this vaccine. Advised may receive this vaccine at local pharmacy or Health Dept. Aware to provide a copy of the vaccination record if obtained from local pharmacy or Health Dept. Verbalized acceptance and understanding.  Covid-19 vaccine status: Information provided on how to obtain vaccines.   Qualifies for Shingles Vaccine? Yes   Zostavax completed No   Shingrix Completed?: No.    Education has been provided regarding the importance of this vaccine. Patient has been advised to call insurance company to determine out of pocket expense if they have not yet received this vaccine. Advised may also receive vaccine at local pharmacy or Health Dept. Verbalized acceptance and understanding.  Screening Tests Health Maintenance  Topic Date Due   Zoster Vaccines- Shingrix (1 of 2) Never done   Lung Cancer Screening  02/05/2010   COVID-19 Vaccine (4 - 2023-24 season) 06/15/2023   Medicare Annual Wellness (AWV)  07/03/2024   DTaP/Tdap/Td (3 - Td or Tdap) 08/07/2025   Colonoscopy  07/13/2027   INFLUENZA VACCINE  Completed   Hepatitis C Screening  Completed   HIV Screening  Completed   HPV VACCINES  Aged Out    Health Maintenance  Health Maintenance Due  Topic Date Due   Zoster Vaccines- Shingrix (1 of 2)  Never done   Lung Cancer Screening  02/05/2010   COVID-19 Vaccine (4 - 2023-24 season) 06/15/2023    Colorectal cancer screening: Type of screening: Colonoscopy. Completed 07/12/2020. Repeat every 10 years  Lung Cancer Screening: (Low Dose CT Chest recommended if Age 53-80 years, 20 pack-year currently smoking OR have quit w/in 15years.) does qualify.   Lung Cancer Screening Referral:  Additional Screening:  Hepatitis C Screening: does qualify; Completed 09/13/2016  Vision Screening: Recommended annual ophthalmology exams for early detection of glaucoma and other disorders of the eye. Is the patient up to date with their annual eye exam?  Yes  Who is the provider or what is the name of the office in which the patient attends annual eye exams? Dr. Dione Booze If pt is not established with a provider, would they like to be referred to a provider to establish care? No .   Dental Screening: Recommended annual dental exams for proper oral hygiene   Community Resource Referral / Chronic Care Management: CRR required this visit?  No   CCM required this visit?  No     Plan:     I have personally reviewed and noted the following in the patient's chart:   Medical and social history Use of alcohol, tobacco or illicit drugs  Current medications and supplements including opioid prescriptions. Patient is currently taking opioid prescriptions. Information provided to patient regarding non-opioid alternatives. Patient advised to discuss non-opioid treatment plan with their provider. Functional ability and status Nutritional status Physical activity Advanced directives List of other physicians Hospitalizations, surgeries, and ER visits in previous 12 months Vitals Screenings to include cognitive, depression, and falls Referrals and appointments  In addition, I have reviewed and discussed with patient certain preventive protocols, quality metrics, and best practice recommendations. A written  personalized care plan for preventive services as well as general preventive health recommendations were provided to patient.     Salimatou Simone L Laquanda Bick, CMA   07/04/2023   After Visit  Summary: (MyChart) Due to this being a telephonic visit, the after visit summary with patients personalized plan was offered to patient via MyChart   Nurse Notes: Patient is due for Pneumonia vaccine and the Shingrix vaccine.  He is aware that he can get these done at his local pharmacy.  He is also qualified for a Lung Cancer screening, which his last one was in 2022.  Patient stated that he would like to be advised by his PCP as to whether he needs it or not. I notified patient of lab results.  Patient verbalized understanding.  He had no other concerns to address today.

## 2023-07-04 NOTE — Patient Instructions (Signed)
Mark Dunlap , Thank you for taking time to come for your Medicare Wellness Visit. I appreciate your ongoing commitment to your health goals. Please review the following plan we discussed and let me know if I can assist you in the future.   Referrals/Orders/Follow-Ups/Clinician Recommendations: You are due for a Shingrix vaccine and a Pneumonia vaccine, which you can get both done at your local pharmacy.  You also qualify for a Lung Cancer Screening, which can be set up by your PCP.  Please discuss that with him during your next office visit.  It was nice talking with you today and keep up the good work.  Aim for 30 minutes of exercise or brisk walking, 6-8 glasses of water, and 5 servings of fruits and vegetables each day.   This is a list of the screening recommended for you and due dates:  Health Maintenance  Topic Date Due   Zoster (Shingles) Vaccine (1 of 2) Never done   Screening for Lung Cancer  02/05/2010   COVID-19 Vaccine (4 - 2023-24 season) 06/15/2023   Medicare Annual Wellness Visit  07/03/2024   DTaP/Tdap/Td vaccine (3 - Td or Tdap) 08/07/2025   Colon Cancer Screening  07/13/2027   Flu Shot  Completed   Hepatitis C Screening  Completed   HIV Screening  Completed   HPV Vaccine  Aged Out    Advanced directives: (Copy Requested) Please bring a copy of your health care power of attorney and living will to the office to be added to your chart at your convenience.  Next Medicare Annual Wellness Visit scheduled for next year: Yes  Opioids are strong medicines used to treat moderate to severe pain. For some people, especially those who have long-term (chronic) pain, opioids may not be the best choice for pain management due to: Side effects like nausea, constipation, and sleepiness. The risk of addiction (opioid use disorder). The longer you take opioids, the greater your risk of addiction. Pain that lasts for more than 3 months is called chronic pain. Managing chronic pain usually  requires more than one approach and is often provided by a team of health care providers working together (multidisciplinary approach). Pain management may be done at a pain management center or pain clinic. How to manage pain without the use of opioids Use non-opioid medicines Non-opioid medicines for pain may include: Over-the-counter or prescription non-steroidal anti-inflammatory drugs (NSAIDs). These may be the first medicines used for pain. They work well for muscle and bone pain, and they reduce swelling. Acetaminophen. This over-the-counter medicine may work well for milder pain but not swelling. Antidepressants. These may be used to treat chronic pain. A certain type of antidepressant (tricyclics) is often used. These medicines are given in lower doses for pain than when used for depression. Anticonvulsants. These are usually used to treat seizures but may also reduce nerve (neuropathic) pain. Muscle relaxants. These relieve pain caused by sudden muscle tightening (spasms). You may also use a pain medicine that is applied to the skin as a patch, cream, or gel (topical analgesic), such as a numbing medicine. These may cause fewer side effects than medicines taken by mouth. Do certain therapies as directed Some therapies can help with pain management. They include: Physical therapy. You will do exercises to gain strength and flexibility. A physical therapist may teach you exercises to move and stretch parts of your body that are weak, stiff, or painful. You can learn these exercises at physical therapy visits and practice them at home.  Physical therapy may also involve: Massage. Heat wraps or applying heat or cold to affected areas. Electrical signals that interrupt pain signals (transcutaneous electrical nerve stimulation, TENS). Weak lasers that reduce pain and swelling (low-level laser therapy). Signals from your body that help you learn to regulate pain (biofeedback). Occupational  therapy. This helps you to learn ways to function at home and work with less pain. Recreational therapy. This involves trying new activities or hobbies, such as a physical activity or drawing. Mental health therapy, including: Cognitive behavioral therapy (CBT). This helps you learn coping skills for dealing with pain. Acceptance and commitment therapy (ACT) to change the way you think and react to pain. Relaxation therapies, including muscle relaxation exercises and mindfulness-based stress reduction. Pain management counseling. This may be individual, family, or group counseling.  Receive medical treatments Medical treatments for pain management include: Nerve block injections. These may include a pain blocker and anti-inflammatory medicines. You may have injections: Near the spine to relieve chronic back or neck pain. Into joints to relieve back or joint pain. Into nerve areas that supply a painful area to relieve body pain. Into muscles (trigger point injections) to relieve some painful muscle conditions. A medical device placed near your spine to help block pain signals and relieve nerve pain or chronic back pain (spinal cord stimulation device). Acupuncture. Follow these instructions at home Medicines Take over-the-counter and prescription medicines only as told by your health care provider. If you are taking pain medicine, ask your health care providers about possible side effects to watch out for. Do not drive or use heavy machinery while taking prescription opioid pain medicine. Lifestyle  Do not use drugs or alcohol to reduce pain. If you drink alcohol, limit how much you have to: 0-1 drink a day for women who are not pregnant. 0-2 drinks a day for men. Know how much alcohol is in a drink. In the [place] one drink equals one 12 oz bottle of beer (355 mL), one 5 oz glass of wine (148 mL), or one 1 oz glass of hard liquor (44 mL). Do not use any products that contain nicotine or  tobacco. These products include cigarettes, chewing tobacco, and vaping devices, such as e-cigarettes. If you need help quitting, ask your health care provider. Eat a healthy diet and maintain a healthy weight. Poor diet and excess weight may make pain worse. Eat foods that are high in fiber. These include fresh fruits and vegetables, whole grains, and beans. Limit foods that are high in fat and processed sugars, such as fried and sweet foods. Exercise regularly. Exercise lowers stress and may help relieve pain. Ask your health care provider what activities and exercises are safe for you. If your health care provider approves, join an exercise class that combines movement and stress reduction. Examples include yoga and tai chi. Get enough sleep. Lack of sleep may make pain worse. Lower stress as much as possible. Practice stress reduction techniques as told by your therapist. General instructions Work with all your pain management providers to find the treatments that work best for you. You are an important member of your pain management team. There are many things you can do to reduce pain on your own. Consider joining an online or in-person support group for people who have chronic pain. Keep all follow-up visits. This is important. Where to find more information You can find more information about managing pain without opioids from: American Academy of Pain Medicine: painmed.org Institute for Chronic Pain: instituteforchronicpain.org  American Chronic Pain Association: theacpa.org Contact a health care provider if: You have side effects from pain medicine. Your pain gets worse or does not get better with treatments or home therapy. You are struggling with anxiety or depression. Summary Many types of pain can be managed without opioids. Chronic pain may respond better to pain management without opioids. Pain is best managed when you and a team of health care providers work together. Pain  management without opioids may include non-opioid medicines, medical treatments, physical therapy, mental health therapy, and lifestyle changes. Tell your health care providers if your pain gets worse or is not being managed well enough. This information is not intended to replace advice given to you by your health care provider. Make sure you discuss any questions you have with your health care provider. Document Revised: 01/10/2021 Document Reviewed: 01/10/2021 Elsevier Patient Education  2024 [employer].

## 2023-07-05 ENCOUNTER — Encounter: Payer: Self-pay | Admitting: Internal Medicine

## 2023-07-05 NOTE — Assessment & Plan Note (Addendum)
Asympt, stable improved, cont to follow  Lab Results  Component Value Date   PSA 11.44 (H) 07/03/2023   PSA 14.10 (H) 03/31/2023   PSA 4.2 (H) 05/02/2020

## 2023-07-05 NOTE — Assessment & Plan Note (Signed)
Mild to mod, for refer hand surgury,  to f/u any worsening symptoms or concerns

## 2023-07-05 NOTE — Assessment & Plan Note (Signed)
Last vitamin D Lab Results  Component Value Date   VD25OH 40.88 03/31/2023   Stable, cont oral replacement

## 2023-07-05 NOTE — Assessment & Plan Note (Signed)
Lab Results  Component Value Date   WBC 7.0 07/03/2023   HGB 12.7 (L) 07/03/2023   HCT 40.1 07/03/2023   MCV 86.1 07/03/2023   PLT 328.0 07/03/2023  Mild, for f/u liron lab

## 2023-07-05 NOTE — Assessment & Plan Note (Signed)
Lab Results  Component Value Date   HGBA1C 5.4 03/31/2023   Stable, pt to continue current medical treatment  - diet, wt control

## 2023-07-05 NOTE — Assessment & Plan Note (Signed)
Pt counsled to quit, pt not ready

## 2023-07-05 NOTE — Assessment & Plan Note (Signed)
Lab Results  Component Value Date   LDLCALC 40 03/31/2023   Stable, pt to continue current statin lipitor 40 qd

## 2023-10-19 ENCOUNTER — Other Ambulatory Visit: Payer: Self-pay | Admitting: Internal Medicine

## 2023-10-20 ENCOUNTER — Other Ambulatory Visit: Payer: Self-pay | Admitting: Internal Medicine

## 2023-10-20 NOTE — Telephone Encounter (Signed)
 Copied from CRM [id number]. Topic: Clinical - Medication Refill >> Oct 20, 2023 11:27 AM Viola FALCON wrote: Most Recent Primary Care Visit:  Provider: SHELTON TRINNA CROME  Department: [facility]  Visit Type: MEDICARE AWV, SEQUENTIAL  Date: 07/04/2023  Medication: Hydrochlorothiazide , Atorvastatin   Has the patient contacted their pharmacy? Yes, went yesterday (Agent: If no, request that the patient contact the pharmacy for the refill. If patient does not wish to contact the pharmacy document the reason why and proceed with request.) (Agent: If yes, when and what did the pharmacy advise?)  Is this the correct pharmacy for this prescription? Yes If no, delete pharmacy and type the correct one.  This is the patient's preferred pharmacy:   [facility] [address] ([address] [place] - [address] [place] ([place]) [place] [address] Phone: [phone] Fax: [phone]     Has the prescription been filled recently? Yes  Is the patient out of the medication? Yes  Has the patient been seen for an appointment in the last year OR does the patient have an upcoming appointment? Yes  Can we respond through MyChart? Yes  Agent: Please be advised that Rx refills may take up to 3 business days. We ask that you follow-up with your pharmacy.

## 2023-10-20 NOTE — Telephone Encounter (Signed)
 Copied from CRM [id number]. Topic: Clinical - Medication Refill >> Oct 20, 2023 11:27 AM Viola FALCON wrote: Most Recent Primary Care Visit:  Provider: TYUS, BRENDELL L  Department: [facility]  Visit Type: MEDICARE AWV, SEQUENTIAL  Date: 07/04/2023  Medication: hydrochlorothiazide , pantoprazole , atorvastatin   Has the patient contacted their pharmacy?  (Agent: If no, request that the patient contact the pharmacy for the refill. If patient does not wish to contact the pharmacy document the reason why and proceed with request.) (Agent: If yes, when and what did the pharmacy advise?)  Is this the correct pharmacy for this prescription?  If no, delete pharmacy and type the correct one.  This is the patient's preferred pharmacy:  [facility] [address] ([address]), [place] - [address] [place] ([place]) [place] [address] Phone: [phone] Fax: [phone]  [id number] - [place], [place] - [address] [address] Phone: [phone] Fax: [phone]   Has the prescription been filled recently?   Is the patient out of the medication?   Has the patient been seen for an appointment in the last year OR does the patient have an upcoming appointment?   Can we respond through MyChart?   Agent: Please be advised that Rx refills may take up to 3 business days. We ask that you follow-up with your pharmacy.

## 2023-10-22 ENCOUNTER — Telehealth: Payer: Self-pay | Admitting: Internal Medicine

## 2023-10-22 NOTE — Telephone Encounter (Signed)
 Copied from CRM [id number]. Topic: Clinical - Prescription Issue >> Oct 22, 2023  1:31 PM Macario HERO wrote: Reason for CRM: Patient said he is about out of his medications: Hydrochlorothiazide , Atorvastatin  and the pharmacy said he would need a new prescription sent over before he can receive the medication because he should not be due for a refill at this moment.

## 2023-10-22 NOTE — Telephone Encounter (Signed)
 Pharmacy is correct Patient is not due for a refill , a year supply was sent in June 2024.

## 2024-04-18 ENCOUNTER — Other Ambulatory Visit: Payer: Self-pay | Admitting: Internal Medicine

## 2024-04-29 ENCOUNTER — Ambulatory Visit: Payer: Self-pay | Admitting: Internal Medicine

## 2024-04-29 ENCOUNTER — Encounter: Payer: Self-pay | Admitting: Internal Medicine

## 2024-04-29 ENCOUNTER — Ambulatory Visit: Admitting: Internal Medicine

## 2024-04-29 VITALS — BP 164/102 | HR 88 | Temp 98.2°F | Ht 72.0 in | Wt 167.2 lb

## 2024-04-29 DIAGNOSIS — F419 Anxiety disorder, unspecified: Secondary | ICD-10-CM | POA: Diagnosis not present

## 2024-04-29 DIAGNOSIS — F32A Depression, unspecified: Secondary | ICD-10-CM

## 2024-04-29 DIAGNOSIS — F17200 Nicotine dependence, unspecified, uncomplicated: Secondary | ICD-10-CM

## 2024-04-29 DIAGNOSIS — E7800 Pure hypercholesterolemia, unspecified: Secondary | ICD-10-CM

## 2024-04-29 DIAGNOSIS — I10 Essential (primary) hypertension: Secondary | ICD-10-CM | POA: Diagnosis not present

## 2024-04-29 DIAGNOSIS — E559 Vitamin D deficiency, unspecified: Secondary | ICD-10-CM | POA: Diagnosis not present

## 2024-04-29 DIAGNOSIS — A630 Anogenital (venereal) warts: Secondary | ICD-10-CM | POA: Diagnosis not present

## 2024-04-29 DIAGNOSIS — E538 Deficiency of other specified B group vitamins: Secondary | ICD-10-CM

## 2024-04-29 DIAGNOSIS — C61 Malignant neoplasm of prostate: Secondary | ICD-10-CM | POA: Diagnosis not present

## 2024-04-29 DIAGNOSIS — D509 Iron deficiency anemia, unspecified: Secondary | ICD-10-CM | POA: Diagnosis not present

## 2024-04-29 DIAGNOSIS — R739 Hyperglycemia, unspecified: Secondary | ICD-10-CM | POA: Diagnosis not present

## 2024-04-29 DIAGNOSIS — Z0000 Encounter for general adult medical examination without abnormal findings: Secondary | ICD-10-CM

## 2024-04-29 DIAGNOSIS — Z0001 Encounter for general adult medical examination with abnormal findings: Secondary | ICD-10-CM

## 2024-04-29 LAB — HEPATIC FUNCTION PANEL
ALT: 21 U/L (ref 0–53)
AST: 15 U/L (ref 0–37)
Albumin: 4.2 g/dL (ref 3.5–5.2)
Alkaline Phosphatase: 109 U/L (ref 39–117)
Bilirubin, Direct: 0.1 mg/dL (ref 0.0–0.3)
Total Bilirubin: 0.5 mg/dL (ref 0.2–1.2)
Total Protein: 7.1 g/dL (ref 6.0–8.3)

## 2024-04-29 LAB — VITAMIN D 25 HYDROXY (VIT D DEFICIENCY, FRACTURES): VITD: 35.95 ng/mL (ref 30.00–100.00)

## 2024-04-29 LAB — CBC WITH DIFFERENTIAL/PLATELET
Basophils Absolute: 0 K/uL (ref 0.0–0.1)
Basophils Relative: 0.6 % (ref 0.0–3.0)
Eosinophils Absolute: 0.3 K/uL (ref 0.0–0.7)
Eosinophils Relative: 3.4 % (ref 0.0–5.0)
HCT: 43.3 % (ref 39.0–52.0)
Hemoglobin: 14 g/dL (ref 13.0–17.0)
Lymphocytes Relative: 20.2 % (ref 12.0–46.0)
Lymphs Abs: 1.5 K/uL (ref 0.7–4.0)
MCHC: 32.3 g/dL (ref 30.0–36.0)
MCV: 90.9 fl (ref 78.0–100.0)
Monocytes Absolute: 0.9 K/uL (ref 0.1–1.0)
Monocytes Relative: 11.8 % (ref 3.0–12.0)
Neutro Abs: 4.9 K/uL (ref 1.4–7.7)
Neutrophils Relative %: 64 % (ref 43.0–77.0)
Platelets: 344 K/uL (ref 150.0–400.0)
RBC: 4.77 Mil/uL (ref 4.22–5.81)
RDW: 16.3 % — ABNORMAL HIGH (ref 11.5–15.5)
WBC: 7.6 K/uL (ref 4.0–10.5)

## 2024-04-29 LAB — LIPID PANEL
Cholesterol: 149 mg/dL (ref 0–200)
HDL: 56.4 mg/dL (ref >39.00)
LDL Cholesterol: 57 mg/dL (ref 0–99)
NonHDL: 92.36
Total CHOL/HDL Ratio: 3
Triglycerides: 177 mg/dL — ABNORMAL HIGH (ref 0.0–149.0)
VLDL: 35.4 mg/dL (ref 0.0–40.0)

## 2024-04-29 LAB — BASIC METABOLIC PANEL WITH GFR
BUN: 15 mg/dL (ref 6–23)
CO2: 30 meq/L (ref 19–32)
Calcium: 9.9 mg/dL (ref 8.4–10.5)
Chloride: 101 meq/L (ref 96–112)
Creatinine, Ser: 1.12 mg/dL (ref 0.40–1.50)
GFR: 69.56 mL/min (ref >60.00)
Glucose, Bld: 92 mg/dL (ref 70–99)
Potassium: 4.9 meq/L (ref 3.5–5.1)
Sodium: 139 meq/L (ref 135–145)

## 2024-04-29 LAB — IBC PANEL
Iron: 71 ug/dL (ref 42–165)
Saturation Ratios: 16.3 % — ABNORMAL LOW (ref 20.0–50.0)
TIBC: 435.4 ug/dL (ref 250.0–450.0)
Transferrin: 311 mg/dL (ref 212.0–360.0)

## 2024-04-29 LAB — FERRITIN: Ferritin: 25.1 ng/mL (ref 22.0–322.0)

## 2024-04-29 LAB — PSA: PSA: 8.85 ng/mL — ABNORMAL HIGH (ref 0.10–4.00)

## 2024-04-29 LAB — TSH: TSH: 1.43 u[IU]/mL (ref 0.35–5.50)

## 2024-04-29 LAB — HEMOGLOBIN A1C: Hgb A1c MFr Bld: 5.6 % (ref 4.6–6.5)

## 2024-04-29 LAB — VITAMIN B12: Vitamin B-12: 346 pg/mL (ref 211–911)

## 2024-04-29 MED ORDER — PANTOPRAZOLE SODIUM 40 MG PO TBEC: 40.0000 mg | DELAYED_RELEASE_TABLET | Freq: Every day | ORAL | 3 refills | Status: AC

## 2024-04-29 MED ORDER — GABAPENTIN 100 MG PO CAPS: 200.0000 mg | ORAL_CAPSULE | Freq: Two times a day (BID) | ORAL | 1 refills | Status: AC

## 2024-04-29 MED ORDER — NORTRIPTYLINE HCL 25 MG PO CAPS: 25.0000 mg | ORAL_CAPSULE | Freq: Every day | ORAL | 1 refills | Status: AC

## 2024-04-29 MED ORDER — TAMSULOSIN HCL 0.4 MG PO CAPS: 0.4000 mg | ORAL_CAPSULE | Freq: Two times a day (BID) | ORAL | 3 refills | Status: AC

## 2024-04-29 MED ORDER — SILDENAFIL CITRATE 100 MG PO TABS: 100.0000 mg | ORAL_TABLET | ORAL | 11 refills | Status: AC | PRN

## 2024-04-29 MED ORDER — TRETINOIN 0.1 % EX CREA: 1.0000 | TOPICAL_CREAM | Freq: Every day | CUTANEOUS | 2 refills | Status: AC

## 2024-04-29 MED ORDER — POLYSACCHARIDE IRON COMPLEX 150 MG PO CAPS: 150.0000 mg | ORAL_CAPSULE | Freq: Every day | ORAL | 3 refills | Status: AC

## 2024-04-29 MED ORDER — CITALOPRAM HYDROBROMIDE 40 MG PO TABS: 40.0000 mg | ORAL_TABLET | Freq: Every day | ORAL | 3 refills | Status: DC

## 2024-04-29 MED ORDER — HYDROCHLOROTHIAZIDE 12.5 MG PO CAPS: ORAL_CAPSULE | ORAL | 3 refills | Status: AC

## 2024-04-29 MED ORDER — FLUTICASONE PROPIONATE 50 MCG/ACT NA SUSP: NASAL | 5 refills | Status: AC

## 2024-04-29 MED ORDER — FAMOTIDINE 40 MG PO TABS: 40.0000 mg | ORAL_TABLET | Freq: Two times a day (BID) | ORAL | 3 refills | Status: AC

## 2024-04-29 MED ORDER — IMIQUIMOD 5 % EX CREA: TOPICAL_CREAM | CUTANEOUS | 2 refills | Status: AC

## 2024-04-29 NOTE — Assessment & Plan Note (Signed)
Lab Results  Component Value Date   HGBA1C 5.4 03/31/2023   Stable, pt to continue current medical treatment  - diet, wt control

## 2024-04-29 NOTE — Assessment & Plan Note (Signed)
 Also for repeat Aldara  cream asd

## 2024-04-29 NOTE — Assessment & Plan Note (Signed)
 With increased anxiety situaitonal, try to avoid benzo, so increase celexa  40 mg qd

## 2024-04-29 NOTE — Assessment & Plan Note (Signed)
Last vitamin D Lab Results  Component Value Date   VD25OH 40.88 03/31/2023   Stable, cont oral replacement

## 2024-04-29 NOTE — Patient Instructions (Signed)
 Ok to increase the celexa  to 40 mg per day  Please continue all other medications as before, and refills have been done for all, including the cream  Please quit smoking  Please have the pharmacy call with any other refills you may need.  Please continue your efforts at being more active, low cholesterol diet, and weight control.  You are otherwise up to date with prevention measures today.  Please keep your appointments with your specialists as you may have planned  You will be contacted regarding the referral for: Pulmonary for Lung cancer screening  Please go to the LAB at the blood drawing area for the tests to be done  You will be contacted by phone if any changes need to be made immediately.  Otherwise, you will receive a letter about your results with an explanation, but please check with MyChart first.  Please make an Appointment to return for your 1 year visit, or sooner if needed

## 2024-04-29 NOTE — Assessment & Plan Note (Signed)
 No recent overt bleeding; for f/u iron  labs

## 2024-04-29 NOTE — Assessment & Plan Note (Addendum)
 BP Readings from Last 3 Encounters:  04/29/24 (!) 164/102  07/03/23 128/70  03/31/23 122/74   Uncontrolled, but pt states controlled at home, pt to continue medical treatment hct 12.5 every day, declines change today

## 2024-04-29 NOTE — Progress Notes (Signed)
 The test results show that your current treatment is OK, as the tests are stable.  Please continue the same plan.  There is no other need for change of treatment or further evaluation based on these results, at this time.  thanks

## 2024-04-29 NOTE — Assessment & Plan Note (Signed)
 For psa today with labs, also follows with urology

## 2024-04-29 NOTE — Assessment & Plan Note (Signed)
 Lab Results  Component Value Date   LDLCALC 40 03/31/2023   Stable, pt to continue current statin lipitor 40 mg every day, and f/u lab today

## 2024-04-29 NOTE — Assessment & Plan Note (Signed)
 Pt counsled to quit, pt not ready, also for refer pulm for LDCt screening

## 2024-04-29 NOTE — Progress Notes (Signed)
 Patient ID: Mark Dunlap, male   DOB: 22-May-1961, 63 y.o.   MRN: [id number]         Chief Complaint:: wellness exam and Medication Management (Medication adjustment for mood (believe to be celexa  but unsure). Having issues with congestion, sputum. All refills pended except for controlled)  , allergies, htn, genital warts , anxiety, smoker       HPI:  Mark Dunlap is a 63 y.o. male here for wellness exam; for shingrix at pharmacy, due for LDCT screening, still smoking, not ready to quit, o/w up to date                        Also has had increased clearish phelgm worse in the AM recently but Pt denies chest pain, increased sob or doe, wheezing, orthopnea, PND, increased LE swelling, palpitations, dizziness or syncope.   Pt denies polydipsia, polyuria, or new focal neuro s/s.    Pt denies fever, wt loss, night sweats, loss of appetite, or other constitutional symptoms  Does have several wks ongoing nasal allergy symptoms with clearish congestion, itch and sneezing, without fever, pain, ST, cough, swelling or wheezing.  Also has recurrence of genital warts asking for repeat treatment.  BP has been controlled at home, states did not take BP med today as he slept in on vacation.  Denies worsening depressive symptoms, suicidal ideation, or panic; has ongoing anxiety, now increased due to stress with livein girlfriend.     Wt Readings from Last 3 Encounters:  04/29/24 167 lb 3.2 oz (75.8 kg)  07/04/23 156 lb (70.8 kg)  07/03/23 156 lb (70.8 kg)   BP Readings from Last 3 Encounters:  04/29/24 (!) 164/102  07/03/23 128/70  03/31/23 122/74   Immunization History  Administered Date(s) Administered   Influenza Split 08/20/2012   Influenza Whole 08/25/2008   Influenza, Seasonal, Injecte, Preservative Fre 07/03/2023   Influenza,inj,Quad PF,6+ Mos 07/19/2013, 07/06/2014, 08/08/2015, 08/21/2017, 09/07/2018, 07/01/2019, 07/16/2021   Influenza-Unspecified 07/17/2020   Moderna Sars-Covid-2  Vaccination 11/30/2019, 12/29/2019, 08/11/2020   PNEUMOCOCCAL CONJUGATE-20 07/16/2021   Pneumococcal Polysaccharide-23 07/30/2014, 07/01/2019   Td 09/15/2006   Tdap 08/08/2015   Health Maintenance Due  Topic Date Due   Meningococcal B Vaccine (1 of 4 - Increased Risk) Never done   Zoster Vaccines- Shingrix (1 of 2) Never done   Lung Cancer Screening  02/05/2010      Past Medical History:  Diagnosis Date   Aortic atherosclerosis (HCC)    Chronic back pain    upper to lower   Depression    GERD (gastroesophageal reflux disease)    Glaucoma, both eyes    H/O: substance abuse (HCC)    03-21-2021   pt stated hx of ETOH/Crack cocaine-none since 12/2012 per pt/Does not Drive due to this   History of CVA (cerebrovascular accident) without residual deficits 10/26/2018   right BG/ CR infarct secondart to moderate SVD,  no residual   History of hyperthyroidism 2011   s/p RAI  06-20-2010,  followed by pcp   History of prostatitis 2010   HLD (hyperlipidemia)    Hypertension    followed by pcp    (nuclear stress test in epic 08-30-2013  normal no ischemia , ef 50%)   Legally blind in left eye, as defined in [place]     Blind Left eye, small amt vision Right eye   Lumbar radiculopathy    Lumbar radiculopathy    OA (osteoarthritis)    Prostate cancer (  [facility]) urologist--- dr cam   dx 11-21-2020, Stage T1c, Gleason 4+3   Right thyroid  nodule    Seasonal allergies    Spondylolisthesis, lumbar region    Spondylolisthesis, lumbar region    Unspecified fracture of unspecified lumbar vertebra, subsequent encounter for fracture with nonunion    Wears dentures    upper full and lower partial   Wears glasses    Past Surgical History:  Procedure Laterality Date   ABDOMINAL EXPOSURE N/A 08/14/2022   Procedure: ABDOMINAL EXPOSURE;  Surgeon: Gretta Lonni PARAS, MD;  Location: [facility] OR;  Service: Vascular;  Laterality: N/A;   ANTERIOR CERVICAL DECOMP/DISCECTOMY FUSION  10/11/2011   Procedure:  ANTERIOR CERVICAL DECOMPRESSION/DISCECTOMY FUSION 3 LEVELS;  Surgeon: Alm GORMAN Molt;  Location: [facility];  Service: Neurosurgery;  Laterality: N/A;  Cervical three-four ,cervical four five cervical five six Anterior Cervical Decompression Fusion with peek + plate Nuvasive translational plate Orthofix peek (2 1/2 hours) Rm # [id number]   ANTERIOR CERVICAL DECOMP/DISCECTOMY FUSION N/A 07/29/2014   Procedure: ANTERIOR CERVICAL DECOMPRESSION/DISCECTOMY FUSION 1 LEVEL/HARDWARE REMOVAL;  Surgeon: Alm GORMAN Molt, MD;  Location: [facility];  Service: Neurosurgery;  Laterality: N/A;  cervical six-seven   ANTERIOR LUMBAR FUSION N/A 08/14/2022   Procedure: Anterior Lumbar Interbody Fusion - Lumbar Four-Lumbar Five with plate;  Surgeon: Molt Alm GORMAN, MD;  Location: [facility] OR;  Service: Neurosurgery;  Laterality: N/A;   COLONOSCOPY     COLONOSCOPY  lastone 07-12-2020   CYSTOSCOPY N/A 03/23/2021   Procedure: CYSTOSCOPY FLEXIBLE;  Surgeon: Watt Rush, MD;  Location: [facility];  Service: Urology;  Laterality: N/A;   HAND SURGERY Right x2  12/ 2007   I & D abscess   HARDWARE REMOVAL N/A 09/11/2020   Procedure: Removal of hardware LUmbar Four-Lumbar Five;  Surgeon: Molt Alm GORMAN, MD;  Location[facility] OR;  Service: Neurosurgery;  Laterality: N/A;   LAMINECTOMY WITH POSTERIOR LATERAL ARTHRODESIS LEVEL 2 N/A 09/11/2020   Procedure: Laminectomy and Foraminotomy - Lumbar Three-Lumbar Four, posterior lateral fusion Lumbar Three-Four and Lumbar Four- Five;  Surgeon: Molt Alm GORMAN, MD;  Location: [facility] OR;  Service: Neurosurgery;  Laterality: N/A;   POSTERIOR CERVICAL FUSION/FORAMINOTOMY N/A 09/23/2013   Procedure: CERVICALTWO TO CERVICAL SEVEN POSTERIOR CERVICAL FUSION/FORAMINOTOMY WITH LATERAL MASS FIXATION;  Surgeon: Alm GORMAN Molt, MD;  Location: [facility];  Service: Neurosurgery;  Laterality: N/A;   POSTERIOR LUMBAR FUSION  11-27-2017  @[facility]    L4--5   RADIOACTIVE SEED IMPLANT N/A 03/23/2021   Procedure:  RADIOACTIVE SEED IMPLANT/BRACHYTHERAPY IMPLANT;  Surgeon: Watt Rush, MD;  Location: [facility];  Service: Urology;  Laterality: N/A;   SPACE OAR INSTILLATION N/A 03/23/2021   Procedure: SPACE OAR INSTILLATION;  Surgeon: Watt Rush, MD;  Location: [facility];  Service: Urology;  Laterality: N/A;   TOTAL HIP ARTHROPLASTY  12/17/2011   Procedure: TOTAL HIP ARTHROPLASTY;  Surgeon: Fonda SHAUNNA Olmsted, MD;  Location: [facility] OR;  Service: Orthopedics;  Laterality: Left;    reports that he has been smoking cigarettes. He has a 47 pack-year smoking history. He has never used smokeless tobacco. He reports that he does not currently use drugs after having used the following drugs: Crack cocaine. He reports that he does not drink alcohol . family history includes Alcohol  abuse in his brother, cousin, and father; Arthritis in his mother; Cancer in his brother and sister; Diabetes in his brother and sister; Goiter in his mother and sister; Heart disease in his father and another family member;  Hyperlipidemia in his mother; Hypertension in his sister; Stroke in his father. Allergies  Allergen Reactions   Tramadol  Itching   Morphine  And Codeine Itching   Oxycodone  Itching    Tolerates with benadryl     Current Outpatient Medications on File Prior to Visit  Medication Sig Dispense Refill   adapalene  (DIFFERIN ) 0.1 % gel Apply 1 Application topically at bedtime. 45 g 1   aspirin  EC 81 MG tablet Take 81 mg by mouth daily. Swallow whole.     atorvastatin  (LIPITOR) 40 MG tablet Take 1 tablet (40 mg total) by mouth daily. NEEDS APPOINTMENT FOR FURTHER REFILLS. 90 tablet 0   benzoyl peroxide  10 % LIQD Apply 1 Application topically daily.     bimatoprost  (LUMIGAN ) 0.01 % SOLN Place 1 drop into both eyes at bedtime.     clopidogrel  (PLAVIX ) 75 MG tablet Take 1 tablet (75 mg total) by mouth daily. 90 tablet 3   diphenhydrAMINE  (BENADRYL ) 25 mg capsule Take 50 mg by mouth every 6 (six) hours as  needed for itching.      dorzolamide-timolol  (COSOPT) 2-0.5 % ophthalmic solution 1 drop 2 (two) times daily.     ibuprofen (ADVIL) 200 MG tablet Take 400 mg by mouth every 6 (six) hours as needed for headache or moderate pain.     Multiple Vitamins-Minerals (ONE DAILY FOR MEN/LYCOPENE) TABS Take 1 tablet by mouth daily.     naloxone (NARCAN) nasal spray 4 mg/0.1 mL Place 1 spray into the nose as needed (opioid overdose).     omeprazole (PRILOSEC OTC) 20 MG tablet Take 20 mg by mouth daily.     oxybutynin  (DITROPAN ) 5 MG tablet Take 1 tablet (5 mg total) by mouth at bedtime. 90 tablet 3   oxyCODONE -acetaminophen  (PERCOCET) 10-325 MG tablet Take 1 tablet by mouth every 4 (four) hours as needed for pain. 120 tablet 0   timolol  (TIMOPTIC ) 0.5 % ophthalmic solution Place 1 drop into both eyes daily.  6   tiZANidine  (ZANAFLEX ) 4 MG tablet Take 4 mg by mouth every 8 (eight) hours as needed for muscle spasms.      zolpidem  (AMBIEN ) 10 MG tablet Take 1 tablet (10 mg total) by mouth at bedtime as needed. for sleep 90 tablet 1   No current facility-administered medications on file prior to visit.        ROS:  All others reviewed and negative.  Objective        PE:  BP (!) 164/102   Pulse 88   Temp 98.2 F (36.8 C)   Ht 6' (1.829 m)   Wt 167 lb 3.2 oz (75.8 kg)   SpO2 99%   BMI 22.68 kg/m                 Constitutional: Pt appears in NAD               HENT: Head: NCAT.                Right Ear: External ear normal.                 Left Ear: External ear normal.                Eyes: . Pupils are equal, round, and reactive to light. Conjunctivae and EOM are normal               Nose: without d/c or deformity  Neck: Neck supple. Gross normal ROM               Cardiovascular: Normal rate and regular rhythm.                 Pulmonary/Chest: Effort normal and breath sounds without rales or wheezing.                Abd:  Soft, NT, ND, + BS, no organomegaly               Neurological:  Pt is alert. At baseline orientation, motor grossly intact               Skin: Skin is warm. No rashes, no other new lesions, LE edema - none               Psychiatric: Pt behavior is normal without agitation   Micro: none  Cardiac tracings I have personally interpreted today:  none  Pertinent Radiological findings (summarize): none   Lab Results  Component Value Date   WBC 7.0 07/03/2023   HGB 12.7 (L) 07/03/2023   HCT 40.1 07/03/2023   PLT 328.0 07/03/2023   GLUCOSE 111 (H) 03/31/2023   CHOL 140 03/31/2023   TRIG 174.0 (H) 03/31/2023   HDL 65.10 03/31/2023   LDLDIRECT 100.0 09/02/2018   LDLCALC 40 03/31/2023   ALT 18 03/31/2023   AST 16 03/31/2023   NA 139 03/31/2023   K 4.6 03/31/2023   CL 101 03/31/2023   CREATININE 1.04 03/31/2023   BUN 13 03/31/2023   CO2 29 03/31/2023   TSH 1.66 03/31/2023   PSA 11.44 (H) 07/03/2023   INR 1.0 08/02/2022   HGBA1C 5.4 03/31/2023   Assessment/Plan:  Mark Dunlap is a 63 y.o. Black or African American [2] male with  has a past medical history of Aortic atherosclerosis (HCC), Chronic back pain, Depression, GERD (gastroesophageal reflux disease), Glaucoma, both eyes, H/O: substance abuse (HCC), History of CVA (cerebrovascular accident) without residual deficits (10/26/2018), History of hyperthyroidism (2011), History of prostatitis (2010), HLD (hyperlipidemia), Hypertension, Legally blind in left eye, as defined in [place] , Lumbar radiculopathy, Lumbar radiculopathy, OA (osteoarthritis), Prostate cancer (HCC) (urologist--- dr cam), Right thyroid  nodule, Seasonal allergies, Spondylolisthesis, lumbar region, Spondylolisthesis, lumbar region, Unspecified fracture of unspecified lumbar vertebra, subsequent encounter for fracture with nonunion, Wears dentures, and Wears glasses.  Malignant neoplasm of prostate (HCC) For psa today with labs, also follows with urology  Encounter for well adult exam with abnormal findings Age and sex  appropriate education and counseling updated with regular exercise and diet Referrals for preventative services - refer for LDCT screening Immunizations addressed - none needed Smoking counseling  - pt counseled to quit, pt not ready Evidence for depression or other mood disorder - uncontrolled anxiety - for increased celexa  40 mg qd Most recent labs reviewed. I have personally reviewed and have noted: 1) the patient's medical and social history 2) The patient's current medications and supplements 3) The patient's height, weight, and BMI have been recorded in the chart   Vitamin D  deficiency Last vitamin D  Lab Results  Component Value Date   VD25OH 40.88 03/31/2023   Stable, cont oral replacement   Smoker Pt counsled to quit, pt not ready, also for refer pulm for LDCt screening  Iron  deficiency anemia No recent overt bleeding; for f/u iron  labs  Hyperlipidemia Lab Results  Component Value Date   LDLCALC 40 03/31/2023   Stable, pt to continue current statin lipitor 40 mg  every day, and f/u lab today   Hyperglycemia Lab Results  Component Value Date   HGBA1C 5.4 03/31/2023   Stable, pt to continue current medical treatment  - diet , wt control   Essential hypertension BP Readings from Last 3 Encounters:  04/29/24 (!) 164/102  07/03/23 128/70  03/31/23 122/74   Uncontrolled, but pt states controlled at home, pt to continue medical treatment hct 12.5 every day, declines change today   Anxiety and depression With increased anxiety situaitonal, try to avoid benzo, so increase celexa  40 mg qd  Perianal wart Also for repeat Aldara  cream asd  Followup: Return in about 1 year (around 04/29/2025).  Lynwood Rush, MD 04/29/2024 2:48 PM [facility]

## 2024-04-29 NOTE — Assessment & Plan Note (Signed)
 Age and sex appropriate education and counseling updated with regular exercise and diet Referrals for preventative services - refer for LDCT screening Immunizations addressed - none needed Smoking counseling  - pt counseled to quit, pt not ready Evidence for depression or other mood disorder - uncontrolled anxiety - for increased celexa  40 mg qd Most recent labs reviewed. I have personally reviewed and have noted: 1) the patient's medical and social history 2) The patient's current medications and supplements 3) The patient's height, weight, and BMI have been recorded in the chart

## 2024-05-12 NOTE — Telephone Encounter (Signed)
 Error

## 2024-05-30 ENCOUNTER — Other Ambulatory Visit: Payer: Self-pay | Admitting: Internal Medicine

## 2024-06-21 ENCOUNTER — Ambulatory Visit (INDEPENDENT_AMBULATORY_CARE_PROVIDER_SITE_OTHER)

## 2024-06-21 VITALS — Ht 72.0 in | Wt 167.0 lb

## 2024-06-21 DIAGNOSIS — Z0000 Encounter for general adult medical examination without abnormal findings: Secondary | ICD-10-CM

## 2024-06-21 NOTE — Patient Instructions (Addendum)
 [name],  Thank you for taking the time for your Medicare Wellness Visit. I appreciate your continued commitment to your health goals. Please review the care plan we discussed, and feel free to reach out if I can assist you further.  Medicare recommends these wellness visits once per year to help you and your care team stay ahead of potential health issues. These visits are designed to focus on prevention, allowing your provider to concentrate on managing your acute and chronic conditions during your regular appointments.  Please note that Annual Wellness Visits do not include a physical exam. Some assessments may be limited, especially if the visit was conducted virtually. If needed, we may recommend a separate in-person follow-up with your provider.  Ongoing Care Seeing your primary care provider every 3 to 6 months helps us  monitor your health and provide consistent, personalized care. Last office visit on 04/29/2024.  You are due for a Shingles vaccine and a Flu vaccine, which you can get at your local pharmacy.  You are also has a referral placed for a lung cancer screening.  Please call to get scheduled.   Referrals If a referral was made during today's visit and you haven't received any updates within two weeks, please contact the referred provider directly to check on the status.  Recommended Screenings:  Health Maintenance  Topic Date Due   Zoster (Shingles) Vaccine (1 of 2) Never done   Screening for Lung Cancer  02/05/2010   Flu Shot  05/14/2024   Medicare Annual Wellness Visit  06/21/2025   DTaP/Tdap/Td vaccine (3 - Td or Tdap) 08/07/2025   Colon Cancer Screening  07/13/2027   Pneumococcal Vaccine for age over 83  Completed   Hepatitis C Screening  Completed   HIV Screening  Completed   Hepatitis B Vaccine  Aged Out   HPV Vaccine  Aged Out   Meningitis B Vaccine  Aged Out   COVID-19 Vaccine  Discontinued       06/21/2024   10:17 AM  Advanced Directives  Does Patient  Have a Medical Advance Directive? No   Advance Care Planning is important because it: Ensures you receive medical care that aligns with your values, goals, and preferences. Provides guidance to your family and loved ones, reducing the emotional burden of decision-making during critical moments.  Vision: Annual vision screenings are recommended for early detection of glaucoma, cataracts, and diabetic retinopathy. These exams can also reveal signs of chronic conditions such as diabetes and high blood pressure.  Dental: Annual dental screenings help detect early signs of oral cancer, gum disease, and other conditions linked to overall health, including heart disease and diabetes.  Please see the attached documents for additional preventive care recommendations. Managing Pain Without Opioids Opioids are strong medicines used to treat moderate to severe pain. For some people, especially those who have long-term (chronic) pain, opioids may not be the best choice for pain management due to: Side effects like nausea, constipation, and sleepiness. The risk of addiction (opioid use disorder). The longer you take opioids, the greater your risk of addiction. Pain that lasts for more than 3 months is called chronic pain. Managing chronic pain usually requires more than one approach and is often provided by a team of health care providers working together (multidisciplinary approach). Pain management may be done at a pain management center or pain clinic. How to manage pain without the use of opioids Use non-opioid medicines Non-opioid medicines for pain may include: Over-the-counter or prescription non-steroidal anti-inflammatory  drugs (NSAIDs). These may be the first medicines used for pain. They work well for muscle and bone pain, and they reduce swelling. Acetaminophen . This over-the-counter medicine may work well for milder pain but not swelling. Antidepressants. These may be used to treat chronic pain. A  certain type of antidepressant (tricyclics) is often used. These medicines are given in lower doses for pain than when used for depression. Anticonvulsants. These are usually used to treat seizures but may also reduce nerve (neuropathic) pain. Muscle relaxants. These relieve pain caused by sudden muscle tightening (spasms). You may also use a pain medicine that is applied to the skin as a patch, cream, or gel (topical analgesic), such as a numbing medicine. These may cause fewer side effects than medicines taken by mouth. Do certain therapies as directed Some therapies can help with pain management. They include: Physical therapy. You will do exercises to gain strength and flexibility. A physical therapist may teach you exercises to move and stretch parts of your body that are weak, stiff, or painful. You can learn these exercises at physical therapy visits and practice them at home. Physical therapy may also involve: Massage. Heat wraps or applying heat or cold to affected areas. Electrical signals that interrupt pain signals (transcutaneous electrical nerve stimulation, TENS). Weak lasers that reduce pain and swelling (low-level laser therapy). Signals from your body that help you learn to regulate pain (biofeedback). Occupational therapy. This helps you to learn ways to function at home and work with less pain. Recreational therapy. This involves trying new activities or hobbies, such as a physical activity or drawing. Mental health therapy, including: Cognitive behavioral therapy (CBT). This helps you learn coping skills for dealing with pain. Acceptance and commitment therapy (ACT) to change the way you think and react to pain. Relaxation therapies, including muscle relaxation exercises and mindfulness-based stress reduction. Pain management counseling. This may be individual, family, or group counseling.  Receive medical treatments Medical treatments for pain management include: Nerve  block injections. These may include a pain blocker and anti-inflammatory medicines. You may have injections: Near the spine to relieve chronic back or neck pain. Into joints to relieve back or joint pain. Into nerve areas that supply a painful area to relieve body pain. Into muscles (trigger point injections) to relieve some painful muscle conditions. A medical device placed near your spine to help block pain signals and relieve nerve pain or chronic back pain (spinal cord stimulation device). Acupuncture. Follow these instructions at home Medicines Take over-the-counter and prescription medicines only as told by your health care provider. If you are taking pain medicine, ask your health care providers about possible side effects to watch out for. Do not drive or use heavy machinery while taking prescription opioid pain medicine. Lifestyle  Do not use drugs or alcohol  to reduce pain. If you drink alcohol , limit how much you have to: 0-1 drink a day for women who are not pregnant. 0-2 drinks a day for men. Know how much alcohol  is in a drink. In the [place] one drink equals one 12 oz bottle of beer (355 mL), one 5 oz glass of wine (148 mL), or one 1 oz glass of hard liquor (44 mL). Do not use any products that contain nicotine or tobacco. These products include cigarettes, chewing tobacco, and vaping devices, such as e-cigarettes. If you need help quitting, ask your health care provider. Eat a healthy diet and maintain a healthy weight. Poor diet and excess weight may make pain worse. Eat  foods that are high in fiber. These include fresh fruits and vegetables, whole grains, and beans. Limit foods that are high in fat and processed sugars, such as fried and sweet foods. Exercise regularly. Exercise lowers stress and may help relieve pain. Ask your health care provider what activities and exercises are safe for you. If your health care provider approves, join an exercise class that combines  movement and stress reduction. Examples include yoga and tai chi. Get enough sleep. Lack of sleep may make pain worse. Lower stress as much as possible. Practice stress reduction techniques as told by your therapist. General instructions Work with all your pain management providers to find the treatments that work best for you. You are an important member of your pain management team. There are many things you can do to reduce pain on your own. Consider joining an online or in-person support group for people who have chronic pain. Keep all follow-up visits. This is important. Where to find more information You can find more information about managing pain without opioids from: American Academy of Pain Medicine: painmed.org Institute for Chronic Pain: instituteforchronicpain.org American Chronic Pain Association: theacpa.org Contact a health care provider if: You have side effects from pain medicine. Your pain gets worse or does not get better with treatments or home therapy. You are struggling with anxiety or depression. Summary Many types of pain can be managed without opioids. Chronic pain may respond better to pain management without opioids. Pain is best managed when you and a team of health care providers work together. Pain management without opioids may include non-opioid medicines, medical treatments, physical therapy, mental health therapy, and lifestyle changes. Tell your health care providers if your pain gets worse or is not being managed well enough. This information is not intended to replace advice given to you by your health care provider. Make sure you discuss any questions you have with your health care provider. Document Revised: 01/10/2021 Document Reviewed: 01/10/2021 Elsevier Patient Education  2024 [employer].

## 2024-06-21 NOTE — Progress Notes (Signed)
 Subjective:   Mark Dunlap is a 63 y.o. who presents for a Medicare Wellness preventive visit.  As a reminder, Annual Wellness Visits don't include a physical exam, and some assessments may be limited, especially if this visit is performed virtually. We may recommend an in-person follow-up visit with your provider if needed.  Visit Complete: Virtual I connected with  Mark Dunlap on 06/21/24 by a audio enabled telemedicine application and verified that I am speaking with the correct person using two identifiers.  Patient Location: Other:  work  Arts administrator  I discussed the limitations of evaluation and management by telemedicine. The patient expressed understanding and agreed to proceed.  Vital Signs: Because this visit was a virtual/telehealth visit, some criteria may be missing or patient reported. Any vitals not documented were not able to be obtained and vitals that have been documented are patient reported.  VideoDeclined- This patient declined Librarian, academic. Therefore the visit was completed with audio only.  Persons Participating in Visit: Patient.  AWV Questionnaire: No: Patient Medicare AWV questionnaire was not completed prior to this visit.  Cardiac Risk Factors include: advanced age (>102men, >9 women);male gender;hypertension;Other (see comment), Risk factor comments: CVA     Objective:    Today's Vitals   06/21/24 1010  Weight: 167 lb (75.8 kg)  Height: 6' (1.829 m)  PainSc: 8    Body mass index is 22.65 kg/m.     06/21/2024   10:17 AM 07/04/2023    9:58 AM 08/14/2022    7:49 AM 08/02/2022    9:20 AM 07/02/2022   10:30 AM 05/16/2021    3:46 PM 03/23/2021   10:40 AM  Advanced Directives  Does Patient Have a Medical Advance Directive? No No No No No No No  Would patient like information on creating a medical advance directive?    No - Patient declined No - Patient declined No - Patient declined No -  Patient declined    Current Medications (verified) Outpatient Encounter Medications as of 06/21/2024  Medication Sig   adapalene  (DIFFERIN ) 0.1 % gel Apply 1 Application topically at bedtime.   aspirin  EC 81 MG tablet Take 81 mg by mouth daily. Swallow whole.   atorvastatin  (LIPITOR) 40 MG tablet Take 1 tablet (40 mg total) by mouth daily. NEEDS APPOINTMENT FOR FURTHER REFILLS.   benzoyl peroxide  10 % LIQD Apply 1 Application topically daily.   bimatoprost  (LUMIGAN ) 0.01 % SOLN Place 1 drop into both eyes at bedtime.   citalopram  (CELEXA ) 40 MG tablet Take 1 tablet (40 mg total) by mouth daily.   clopidogrel  (PLAVIX ) 75 MG tablet Take 1 tablet by mouth once daily   diphenhydrAMINE  (BENADRYL ) 25 mg capsule Take 50 mg by mouth every 6 (six) hours as needed for itching.    dorzolamide-timolol  (COSOPT) 2-0.5 % ophthalmic solution 1 drop 2 (two) times daily.   famotidine  (PEPCID ) 40 MG tablet Take 1 tablet (40 mg total) by mouth 2 (two) times daily. Take every morning and at bedtime   fluticasone  (FLONASE ) 50 MCG/ACT nasal spray USE 2 SPRAYS DAILY IN BOTH NOSTRILS   gabapentin  (NEURONTIN ) 100 MG capsule Take 2 capsules (200 mg total) by mouth 2 (two) times daily.   hydrochlorothiazide  (MICROZIDE ) 12.5 MG capsule TAKE 1 CAPSULE BY MOUTH EVERY DAY   ibuprofen (ADVIL) 200 MG tablet Take 400 mg by mouth every 6 (six) hours as needed for headache or moderate pain.   imiquimod  (ALDARA ) 5 % cream Apply  topically 3 (three) times a week.   iron  polysaccharides (NU-IRON ) 150 MG capsule Take 1 capsule (150 mg total) by mouth daily.   Multiple Vitamins-Minerals (ONE DAILY FOR MEN/LYCOPENE) TABS Take 1 tablet by mouth daily.   naloxone (NARCAN) nasal spray 4 mg/0.1 mL Place 1 spray into the nose as needed (opioid overdose).   nortriptyline  (PAMELOR ) 25 MG capsule Take 1 capsule (25 mg total) by mouth at bedtime.   omeprazole (PRILOSEC OTC) 20 MG tablet Take 20 mg by mouth daily.   oxybutynin  (DITROPAN ) 5 MG  tablet Take 1 tablet (5 mg total) by mouth at bedtime.   oxyCODONE -acetaminophen  (PERCOCET) 10-325 MG tablet Take 1 tablet by mouth every 4 (four) hours as needed for pain.   pantoprazole  (PROTONIX ) 40 MG tablet Take 1 tablet (40 mg total) by mouth daily.   sildenafil  (VIAGRA ) 100 MG tablet Take 1 tablet (100 mg total) by mouth as needed for erectile dysfunction.   tamsulosin  (FLOMAX ) 0.4 MG CAPS capsule Take 1 capsule (0.4 mg total) by mouth 2 (two) times daily.   timolol  (TIMOPTIC ) 0.5 % ophthalmic solution Place 1 drop into both eyes daily.   tiZANidine  (ZANAFLEX ) 4 MG tablet Take 4 mg by mouth every 8 (eight) hours as needed for muscle spasms.    tretinoin  (RETIN-A ) 0.1 % cream Apply 1 application  topically at bedtime.   zolpidem  (AMBIEN ) 10 MG tablet Take 1 tablet (10 mg total) by mouth at bedtime as needed. for sleep   No facility-administered encounter medications on file as of 06/21/2024.    Allergies (verified) Tramadol , Morphine  and codeine, and Oxycodone    History: Past Medical History:  Diagnosis Date   Aortic atherosclerosis (HCC)    Chronic back pain    upper to lower   Depression    GERD (gastroesophageal reflux disease)    Glaucoma, both eyes    H/O: substance abuse (HCC)    03-21-2021   pt stated hx of ETOH/Crack cocaine-none since 12/2012 per pt/Does not Drive due to this   History of CVA (cerebrovascular accident) without residual deficits 10/26/2018   right BG/ CR infarct secondart to moderate SVD,  no residual   History of hyperthyroidism 2011   s/p RAI  06-20-2010,  followed by pcp   History of prostatitis 2010   HLD (hyperlipidemia)    Hypertension    followed by pcp    (nuclear stress test in epic 08-30-2013  normal no ischemia , ef 50%)   Legally blind in left eye, as defined in [place]     Blind Left eye, small amt vision Right eye   Lumbar radiculopathy    Lumbar radiculopathy    OA (osteoarthritis)    Prostate cancer [facility]) urologist--- dr cam   dx  11-21-2020, Stage T1c, Gleason 4+3   Right thyroid  nodule    Seasonal allergies    Spondylolisthesis, lumbar region    Spondylolisthesis, lumbar region    Unspecified fracture of unspecified lumbar vertebra, subsequent encounter for fracture with nonunion    Wears dentures    upper full and lower partial   Wears glasses    Past Surgical History:  Procedure Laterality Date   ABDOMINAL EXPOSURE N/A 08/14/2022   Procedure: ABDOMINAL EXPOSURE;  Surgeon: Gretta Lonni PARAS, MD;  Location: [facility] OR;  Service: Vascular;  Laterality: N/A;   ANTERIOR CERVICAL DECOMP/DISCECTOMY FUSION  10/11/2011   Procedure: ANTERIOR CERVICAL DECOMPRESSION/DISCECTOMY FUSION 3 LEVELS;  Surgeon: Alm GORMAN Molt;  Location: [facility];  Service: Neurosurgery;  Laterality: N/A;  Cervical three-four ,cervical four five cervical five six Anterior Cervical Decompression Fusion with peek + plate Nuvasive translational plate Orthofix peek (2 1/2 hours) Rm # [id number]   ANTERIOR CERVICAL DECOMP/DISCECTOMY FUSION N/A 07/29/2014   Procedure: ANTERIOR CERVICAL DECOMPRESSION/DISCECTOMY FUSION 1 LEVEL/HARDWARE REMOVAL;  Surgeon: Alm GORMAN Molt, MD;  Location: [facility];  Service: Neurosurgery;  Laterality: N/A;  cervical six-seven   ANTERIOR LUMBAR FUSION N/A 08/14/2022   Procedure: Anterior Lumbar Interbody Fusion - Lumbar Four-Lumbar Five with plate;  Surgeon: Molt Alm GORMAN, MD;  Location: [facility];  Service: Neurosurgery;  Laterality: N/A;   COLONOSCOPY     COLONOSCOPY  lastone 07-12-2020   CYSTOSCOPY N/A 03/23/2021   Procedure: CYSTOSCOPY FLEXIBLE;  Surgeon: Watt Rush, MD;  Location: [facility];  Service: Urology;  Laterality: N/A;   HAND SURGERY Right x2  12/ 2007   I & D abscess   HARDWARE REMOVAL N/A 09/11/2020   Procedure: Removal of hardware LUmbar Four-Lumbar Five;  Surgeon: Molt Alm GORMAN, MD;  Location: [facility] OR;  Service: Neurosurgery;  Laterality: N/A;   LAMINECTOMY WITH POSTERIOR LATERAL ARTHRODESIS LEVEL  2 N/A 09/11/2020   Procedure: Laminectomy and Foraminotomy - Lumbar Three-Lumbar Four, posterior lateral fusion Lumbar Three-Four and Lumbar Four- Five;  Surgeon: Molt Alm GORMAN, MD;  Location: [facility] OR;  Service: Neurosurgery;  Laterality: N/A;   POSTERIOR CERVICAL FUSION/FORAMINOTOMY N/A 09/23/2013   Procedure: CERVICALTWO TO CERVICAL SEVEN POSTERIOR CERVICAL FUSION/FORAMINOTOMY WITH LATERAL MASS FIXATION;  Surgeon: Alm GORMAN Molt, MD;  Location: [facility];  Service: Neurosurgery;  Laterality: N/A;   POSTERIOR LUMBAR FUSION  11-27-2017  @[facility]    L4--5   RADIOACTIVE SEED IMPLANT N/A 03/23/2021   Procedure: RADIOACTIVE SEED IMPLANT/BRACHYTHERAPY IMPLANT;  Surgeon: Watt Rush, MD;  Location: [facility];  Service: Urology;  Laterality: N/A;   SPACE OAR INSTILLATION N/A 03/23/2021   Procedure: SPACE OAR INSTILLATION;  Surgeon: Watt Rush, MD;  Location: [facility];  Service: Urology;  Laterality: N/A;   TOTAL HIP ARTHROPLASTY  12/17/2011   Procedure: TOTAL HIP ARTHROPLASTY;  Surgeon: Fonda SHAUNNA Olmsted, MD;  Location: [facility] OR;  Service: Orthopedics;  Laterality: Left;   Family History  Problem Relation Age of Onset   Alcohol  abuse Father    Stroke Father    Heart disease Father    Arthritis Mother    Hyperlipidemia Mother    Goiter Mother        resection of benign goiter   Hypertension Sister    Diabetes Sister    Goiter Sister        resection of benign goiter   Cancer Sister        oldest.type unknown.   Diabetes Brother    Cancer Brother        oldest.type unknown.   Alcohol  abuse Brother    Alcohol  abuse Cousin    Heart disease Other        Aunt   Colon cancer Neg Hx    Colon polyps Neg Hx    Esophageal cancer Neg Hx    Rectal cancer Neg Hx    Stomach cancer Neg Hx    Breast cancer Neg Hx    Prostate cancer Neg Hx    Pancreatic cancer Neg Hx    Social History   Socioeconomic History   Marital status: Single    Spouse name: Not on file   Number  of children: 0   Years of education: 12   Highest education level:  12th grade  Occupational History   Occupation: [employer]  Tobacco Use   Smoking status: Every Day    Current packs/day: 1.00    Average packs/day: 1 pack/day for 47.0 years (47.0 ttl pk-yrs)    Types: Cigarettes   Smokeless tobacco: Never   Tobacco comments:    since age 84  Vaping Use   Vaping status: Never Used  Substance and Sexual Activity   Alcohol  use: No    Alcohol /week: 0.0 standard drinks of alcohol     Comment: 03-21-2021 pt stated hx of alcohol  abuse quit 2014   Drug use: Not Currently    Types: Crack cocaine    Comment: 03-21-2021 pt stated last use 12/2012   Sexual activity: Not Currently  Other Topics Concern   Not on file  Social History Narrative   Lives alone.   Social Drivers of Corporate investment banker Strain: Low Risk  (06/21/2024)   Overall Financial Resource Strain (CARDIA)    Difficulty of Paying Living Expenses: Not hard at all  Food Insecurity: No Food Insecurity (06/21/2024)   Hunger Vital Sign    Worried About Running Out of Food in the Last Year: Never true    Ran Out of Food in the Last Year: Never true  Transportation Needs: No Transportation Needs (06/21/2024)   PRAPARE - Administrator, Civil Service (Medical): No    Lack of Transportation (Non-Medical): No  Physical Activity: Inactive (06/21/2024)   Exercise Vital Sign    Days of Exercise per Week: 0 days    Minutes of Exercise per Session: 0 min  Stress: No Stress Concern Present (06/21/2024)   [employer] of Occupational Health - Occupational Stress Questionnaire    Feeling of Stress: Not at all  Social Connections: Moderately Integrated (06/21/2024)   Social Connection and Isolation Panel    Frequency of Communication with Friends and Family: More than three times a week    Frequency of Social Gatherings with Friends and Family: More than three times a week    Attends Religious Services:  More than 4 times per year    Active Member of [employer] or Organizations: Yes    Attends Banker Meetings: Never    Marital Status: Never married    Tobacco Counseling Ready to quit: Not Answered Counseling given: Not Answered Tobacco comments: since age 26    Clinical Intake:  Pre-visit preparation completed: Yes  Pain : 0-10 Pain Score: 8  Pain Type: Chronic pain Pain Location: Back Pain Descriptors / Indicators: Aching, Discomfort Pain Onset: More than a month ago Pain Frequency: Constant Pain Relieving Factors: Oxycodone   Pain Relieving Factors: Oxycodone   BMI - recorded: 22.65 Nutritional Status: BMI of 19-24  Normal  Lab Results  Component Value Date   HGBA1C 5.6 04/29/2024   HGBA1C 5.4 03/31/2023   HGBA1C 5.3 12/07/2021     How often do you need to have someone help you when you read instructions, pamphlets, or other written materials from your doctor or pharmacy?: 4 - Often  Interpreter Needed?: No  Information entered by :: Lilja Soland, RMA   Activities of Daily Living     06/21/2024   10:14 AM 07/04/2023    9:54 AM  In your present state of health, do you have any difficulty performing the following activities:  Hearing? 0 0  Vision? 0 0  Difficulty concentrating or making decisions? 0 0  Walking or climbing stairs? 0 0  Dressing or bathing?  0 0  Doing errands, shopping? 1 0  Comment Uses SCAT Uses SCAT  Preparing Food and eating ? N N  Using the Toilet? N N  In the past six months, have you accidently leaked urine? N N  Do you have problems with loss of bowel control? N N  Managing your Medications? N N  Managing your Finances? N N  Housekeeping or managing your Housekeeping? N N    Patient Care Team: Norleen Lynwood ORN, MD as PCP - Diedre Molt Alm Hamilton, MD as Consulting Physician (Neurosurgery) Octavia Bruckner, MD as Consulting Physician (Ophthalmology) Grayce Buddle, RN as Oncology Nurse Navigator Watt Norleen, MD as  Attending Physician (Urology) Patrcia Cough, MD as Consulting Physician (Radiation Oncology)  I have updated your Care Teams any recent Medical Services you may have received from other providers in the past year.     Assessment:   This is a routine wellness examination for [place].  Hearing/Vision screen Hearing Screening - Comments:: Denies hearing difficulties   Vision Screening - Comments:: Wears eyeglasses/Dr. Octavia   Goals Addressed             This Visit's Progress    Patient Stated   Not on track    I want to quit smoking, decrease the amount of cigarettes I smoke daily. Limit how many packs of cigarettes I buy at the same time. Think about going to [facility] smoking cessation classes. Increase my physical activity by starting to walk daily.       Depression Screen     06/21/2024   10:18 AM 04/29/2024    2:21 PM 07/04/2023   10:06 AM 07/03/2023    1:09 PM 03/31/2023    8:21 AM 07/02/2022   10:29 AM 12/07/2021    4:28 PM  PHQ 2/9 Scores  PHQ - 2 Score 0 0 0 0 0 1 0  PHQ- 9 Score 0  2        Fall Risk     06/21/2024   10:17 AM 04/29/2024    2:21 PM 07/04/2023    9:58 AM 07/03/2023    1:09 PM 03/31/2023    8:21 AM  Fall Risk   Falls in the past year? 0 0 1 0 1  Number falls in past yr: 1 0 1 0 0  Injury with Fall? 0 0 0 0 0  Risk for fall due to : Impaired balance/gait   No Fall Risks History of fall(s)  Follow up Falls evaluation completed;Falls prevention discussed  Falls prevention discussed;Falls evaluation completed Falls evaluation completed Falls evaluation completed    MEDICARE RISK AT HOME:  Medicare Risk at Home Any stairs in or around the home?: No If so, are there any without handrails?: No Home free of loose throw rugs in walkways, pet beds, electrical cords, etc?: Yes Adequate lighting in your home to reduce risk of falls?: Yes Life alert?: No Use of a cane, walker or w/c?: No Grab bars in the bathroom?: No Shower chair or bench in shower?:  No Elevated toilet seat or a handicapped toilet?: No  TIMED UP AND GO:  Was the test performed?  No  Cognitive Function: Declined/Normal: No cognitive concerns noted by patient or family. Patient alert, oriented, able to answer questions appropriately and recall recent events. No signs of memory loss or confusion.        07/04/2023   10:01 AM 07/02/2022   10:32 AM  6CIT Screen  What Year? 0 points 0 points  What month? 0 points 0 points  What time? 0 points 0 points  Count back from 20 0 points 0 points  Months in reverse 0 points 0 points  Repeat phrase 0 points 0 points  Total Score 0 points 0 points    Immunizations Immunization History  Administered Date(s) Administered   Influenza Split 08/20/2012   Influenza Whole 08/25/2008   Influenza, Seasonal, Injecte, Preservative Fre 07/03/2023   Influenza,inj,Quad PF,6+ Mos 07/19/2013, 07/06/2014, 08/08/2015, 08/21/2017, 09/07/2018, 07/01/2019, 07/16/2021   Influenza-Unspecified 07/17/2020   Moderna Sars-Covid-2 Vaccination 11/30/2019, 12/29/2019, 08/11/2020   PNEUMOCOCCAL CONJUGATE-20 07/16/2021   Pneumococcal Polysaccharide-23 07/30/2014, 07/01/2019   Td 09/15/2006   Tdap 08/08/2015    Screening Tests Health Maintenance  Topic Date Due   Zoster Vaccines- Shingrix (1 of 2) Never done   Lung Cancer Screening  02/05/2010   Influenza Vaccine  05/14/2024   Medicare Annual Wellness (AWV)  07/03/2024   DTaP/Tdap/Td (3 - Td or Tdap) 08/07/2025   Colonoscopy  07/13/2027   Pneumococcal Vaccine: 50+ Years  Completed   Hepatitis C Screening  Completed   HIV Screening  Completed   Hepatitis B Vaccines 19-59 Average Risk  Aged Out   HPV VACCINES  Aged Out   Meningococcal B Vaccine  Aged Out   COVID-19 Vaccine  Discontinued    Health Maintenance Items Addressed: See Nurse Notes at the end of this note  Additional Screening:  Vision Screening: Recommended annual ophthalmology exams for early detection of glaucoma and other  disorders of the eye. Is the patient up to date with their annual eye exam?  No  Who is the provider or what is the name of the office in which the patient attends annual eye exams? Dr. Octavia  Dental Screening: Recommended annual dental exams for proper oral hygiene  Community Resource Referral / Chronic Care Management: CRR required this visit?  No   CCM required this visit?  No   Plan:    I have personally reviewed and noted the following in the patient's chart:   Medical and social history Use of alcohol , tobacco or illicit drugs  Current medications and supplements including opioid prescriptions. Patient is currently taking opioid prescriptions. Information provided to patient regarding non-opioid alternatives. Patient advised to discuss non-opioid treatment plan with their provider. Functional ability and status Nutritional status Physical activity Advanced directives List of other physicians Hospitalizations, surgeries, and ER visits in previous 12 months Vitals Screenings to include cognitive, depression, and falls Referrals and appointments  In addition, I have reviewed and discussed with patient certain preventive protocols, quality metrics, and best practice recommendations. A written personalized care plan for preventive services as well as general preventive health recommendations were provided to patient.   Kekai Geter L Shantoria Ellwood, CMA   06/21/2024   After Visit Summary: (MyChart) Due to this being a telephonic visit, the after visit summary with patients personalized plan was offered to patient via MyChart   Notes: Patient is due for a Shingrix vaccine and a Flu vaccine.  Patient stated that he will call to get scheduled for lung screening soon, as he stated that he has received a letter from Pulmonary in the mail about scheduling.  He had no concerns to address today.

## 2024-06-25 ENCOUNTER — Other Ambulatory Visit: Payer: Self-pay

## 2024-07-02 ENCOUNTER — Encounter (HOSPITAL_COMMUNITY): Payer: Self-pay

## 2024-07-02 ENCOUNTER — Ambulatory Visit: Payer: Self-pay

## 2024-07-02 ENCOUNTER — Ambulatory Visit (HOSPITAL_COMMUNITY)
Admission: EM | Admit: 2024-07-02 | Discharge: 2024-07-02 | Disposition: A | Discharge status: Home / Self Care | Attending: Emergency Medicine | Admitting: Emergency Medicine

## 2024-07-02 DIAGNOSIS — M5416 Radiculopathy, lumbar region: Secondary | ICD-10-CM

## 2024-07-02 DIAGNOSIS — M5441 Lumbago with sciatica, right side: Secondary | ICD-10-CM | POA: Diagnosis not present

## 2024-07-02 DIAGNOSIS — G629 Polyneuropathy, unspecified: Secondary | ICD-10-CM | POA: Diagnosis not present

## 2024-07-02 DIAGNOSIS — M5442 Lumbago with sciatica, left side: Secondary | ICD-10-CM

## 2024-07-02 DIAGNOSIS — G8929 Other chronic pain: Secondary | ICD-10-CM

## 2024-07-02 LAB — POCT FASTING CBG KUC MANUAL ENTRY: POCT Glucose (KUC): 106 mg/dL — AB (ref 70–99)

## 2024-07-02 NOTE — ED Triage Notes (Signed)
 Pt c/o chronic lower back pain with multiple surgeries. C/o feet numbness x1wk. Denies pain. Denies taken any meds. Denies injury states does a lot of walking and standing at work.Mark Dunlap

## 2024-07-02 NOTE — Discharge Instructions (Addendum)
 Please start taking your gabapentin  that has been previously prescribed. This should be beneficial for the numbness you are feeling in the feet. Continue other prescribed pain medicines as directed. Please call your spine specialist today to make a follow up appointment.  Please go to the emergency department if symptoms become severe.

## 2024-07-02 NOTE — Telephone Encounter (Signed)
 FYI Only or Action Required?: Action required by provider: referral request. Requesting a referral in regard to back and leg pain. Asking for a follow up call  Patient was last seen in primary care on 04/29/2024 by Norleen Lynwood ORN, MD.  Called Nurse Triage reporting Leg Pain and foot numbness.  Symptoms began several weeks ago.  Interventions attempted: Rest, hydration, or home remedies.  Symptoms are: unchanged.  Triage Disposition: See Physician Within 24 Hours  Patient/caregiver understands and will follow disposition?: No, wishes to speak with PCP  Copied from CRM #[id number]. Topic: Clinical - Red Word Triage >> Jul 02, 2024  9:22 AM Rea ORN wrote: Red Word that prompted transfer to Nurse Triage: Pain shooting down legs, bilateral foot numbness Reason for Disposition  Numbness in a leg or foot (i.e., loss of sensation)  Answer Assessment - Initial Assessment Questions 1. ONSET: When did the pain start?      Pain has been increased over the last week.  2. LOCATION: Where is the pain located?      Both legs but right is worse than left.  3. PAIN: How bad is the pain?    (Scale 1-10; or mild, moderate, severe)     8 out of 10 4. WORK OR EXERCISE: Has there been any recent work or exercise that involved this part of the body?      no 5. CAUSE: What do you think is causing the leg pain?     unsure 6. OTHER SYMPTOMS: Do you have any other symptoms? (e.g., chest pain, back pain, breathing difficulty, swelling, rash, fever, numbness, weakness)     Foot numbness, back pain  Patient with hx of several back surgeries-asking for a referral again to be seen for his back and leg pain. Patient refused an appointment with PCP-wants to see a specialist. Asking for a follow up call today.  Protocols used: Leg Pain-A-AH

## 2024-07-02 NOTE — ED Provider Notes (Signed)
 [facility]    CSN: [id number] Arrival date & time: 07/02/24  1340     History   Chief Complaint Chief Complaint  Patient presents with   Numbness    HPI Mark Dunlap is a 63 y.o. male.  Here with back pain that radiates into both legs, and has numbness in bilateral feet. Back pain has been ongoing for several months, but the pain into the legs and numbness in feet started a week ago.   History of lumbar radiculopathy, spondylosis, chronic back pain Multiple back surgeries. Seems has had sciatica in the past as well  Patient was seen 9 days ago [facility]  He has oxycodone  prescription through pain management center.  Also on nortriptyline , tizanidine   Supposed to take gabapentin  as well. Reports he has not taken this in several weeks.  Denies DM history   Past Medical History:  Diagnosis Date   Aortic atherosclerosis (HCC)    Chronic back pain    upper to lower   Depression    GERD (gastroesophageal reflux disease)    Glaucoma, both eyes    H/O: substance abuse (HCC)    03-21-2021   pt stated hx of ETOH/Crack cocaine-none since 12/2012 per pt/Does not Drive due to this   History of CVA (cerebrovascular accident) without residual deficits 10/26/2018   right BG/ CR infarct secondart to moderate SVD,  no residual   History of hyperthyroidism 2011   s/p RAI  06-20-2010,  followed by pcp   History of prostatitis 2010   HLD (hyperlipidemia)    Hypertension    followed by pcp    (nuclear stress test in epic 08-30-2013  normal no ischemia , ef 50%)   Legally blind in left eye, as defined in [place]     Blind Left eye, small amt vision Right eye   Lumbar radiculopathy    Lumbar radiculopathy    OA (osteoarthritis)    Prostate cancer [facility]) urologist--- dr cam   dx 11-21-2020, Stage T1c, Gleason 4+3   Right thyroid  nodule    Seasonal allergies    Spondylolisthesis, lumbar region    Spondylolisthesis, lumbar region    Unspecified  fracture of unspecified lumbar vertebra, subsequent encounter for fracture with nonunion    Wears dentures    upper full and lower partial   Wears glasses     Patient Active Problem List   Diagnosis Date Noted   Ganglion cyst of wrist, left 07/03/2023   Trigger point of extremity 03/31/2023   S/P lumbar fusion 08/14/2022   External otitis of left ear 06/09/2022   Iron  deficiency anemia 06/09/2022   History of completed stroke 06/09/2022   Anemia 12/08/2021   Vitamin D  deficiency 07/16/2021   Finger pain, right 04/01/2021   Hyperglycemia 04/01/2021   Aortic atherosclerosis (HCC) 03/28/2021   Spondylolisthesis, lumbar region 02/15/2021   Ischial bursitis 01/22/2021   Pain and swelling of left lower leg 01/05/2021   Right-sided ischial pain 01/04/2021   Malignant neoplasm of prostate (HCC) 12/12/2020   Preop exam for internal medicine 10/31/2020   Grief 10/31/2020   S/P lumbar laminectomy 09/11/2020   Lumbar pseudoarthrosis 08/31/2020   Muscle pain 08/24/2020   Right foot pain 11/07/2019   Anxiety and depression 05/03/2019   Cough 03/30/2019   CVA (cerebral vascular accident) (HCC) 10/27/2018   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) 10/26/2018   TIA (transient ischemic attack) 10/26/2018   Insomnia 10/26/2018   Elevated PSA 09/07/2018  Perianal wart 04/23/2018   Bony pelvic pain 03/16/2018   Acne 02/17/2018   S/P lumbar spinal fusion 11/27/2017   GERD (gastroesophageal reflux disease) 09/13/2016   Prostatism 09/13/2016   Fatigue 08/08/2015   Peripheral edema 04/18/2015   Chronic low back pain 01/16/2015   Left leg pain 07/13/2014   Left leg swelling 07/13/2014   Renal insufficiency 07/13/2014   Nocturia 04/05/2014   Increased prostate specific antigen (PSA) velocity 04/05/2014   Hives 12/31/2013   Smoker 12/31/2013   S/P cervical spinal fusion 09/23/2013   Hyperlipidemia 08/20/2013   Muscle cramps 03/11/2013   Chronic pain 08/22/2012   Weight loss  01/03/2012   Anemia associated with acute blood loss 12/18/2011   Hyponatremia 12/18/2011   Osteoarthritis of left hip 08/27/2011   Cervical stenosis of spine 08/27/2011   Degenerative arthritis of hip 08/27/2011   Cervical radicular pain 08/27/2011   Encounter for well adult exam with abnormal findings 04/30/2011   THYROID  NODULE, RIGHT 03/02/2010   SPINAL STENOSIS, CERVICAL 08/08/2009   BACK PAIN 07/31/2009   Acute prostatitis 05/23/2009   PAIN IN JOINT PELVIC REGION AND THIGH 05/09/2009   OTHER&UNSPECIFIED DISEASES THE ORAL SOFT TISSUES 01/30/2009   Pain in joint, lower leg 01/30/2009   Adjustment disorder with depressed mood 08/30/2008   GLAUCOMA ASSOCIATED WITH OCULAR DISORDER 08/30/2008   Essential hypertension 08/30/2008    Past Surgical History:  Procedure Laterality Date   ABDOMINAL EXPOSURE N/A 08/14/2022   Procedure: ABDOMINAL EXPOSURE;  Surgeon: Gretta Lonni PARAS, MD;  Location: [facility] OR;  Service: Vascular;  Laterality: N/A;   ANTERIOR CERVICAL DECOMP/DISCECTOMY FUSION  10/11/2011   Procedure: ANTERIOR CERVICAL DECOMPRESSION/DISCECTOMY FUSION 3 LEVELS;  Surgeon: Alm GORMAN Molt;  Location: [facility];  Service: Neurosurgery;  Laterality: N/A;  Cervical three-four ,cervical four five cervical five six Anterior Cervical Decompression Fusion with peek + plate Nuvasive translational plate Orthofix peek (2 1/2 hours) Rm # [id number]   ANTERIOR CERVICAL DECOMP/DISCECTOMY FUSION N/A 07/29/2014   Procedure: ANTERIOR CERVICAL DECOMPRESSION/DISCECTOMY FUSION 1 LEVEL/HARDWARE REMOVAL;  Surgeon: Alm GORMAN Molt, MD;  Location: [facility];  Service: Neurosurgery;  Laterality: N/A;  cervical six-seven   ANTERIOR LUMBAR FUSION N/A 08/14/2022   Procedure: Anterior Lumbar Interbody Fusion - Lumbar Four-Lumbar Five with plate;  Surgeon: Molt Alm GORMAN, MD;  Location: [facility] OR;  Service: Neurosurgery;  Laterality: N/A;   COLONOSCOPY     COLONOSCOPY  lastone 07-12-2020   CYSTOSCOPY N/A 03/23/2021    Procedure: CYSTOSCOPY FLEXIBLE;  Surgeon: Watt Rush, MD;  Location: [facility];  Service: Urology;  Laterality: N/A;   HAND SURGERY Right x2  12/ 2007   I & D abscess   HARDWARE REMOVAL N/A 09/11/2020   Procedure: Removal of hardware LUmbar Four-Lumbar Five;  Surgeon: Molt Alm GORMAN, MD;  Location: [facility] OR;  Service: Neurosurgery;  Laterality: N/A;   LAMINECTOMY WITH POSTERIOR LATERAL ARTHRODESIS LEVEL 2 N/A 09/11/2020   Procedure: Laminectomy and Foraminotomy - Lumbar Three-Lumbar Four, posterior lateral fusion Lumbar Three-Four and Lumbar Four- Five;  Surgeon: Molt Alm GORMAN, MD;  Location: [facility] OR;  Service: Neurosurgery;  Laterality: N/A;   POSTERIOR CERVICAL FUSION/FORAMINOTOMY N/A 09/23/2013   Procedure: CERVICALTWO TO CERVICAL SEVEN POSTERIOR CERVICAL FUSION/FORAMINOTOMY WITH LATERAL MASS FIXATION;  Surgeon: Alm GORMAN Molt, MD;  Location: [facility];  Service: Neurosurgery;  Laterality: N/A;   POSTERIOR LUMBAR FUSION  11-27-2017  @[facility]    L4--5   RADIOACTIVE SEED IMPLANT N/A 03/23/2021   Procedure: RADIOACTIVE SEED IMPLANT/BRACHYTHERAPY IMPLANT;  Surgeon: Watt Rush, MD;  Location: [facility];  Service: Urology;  Laterality: N/A;   SPACE OAR INSTILLATION N/A 03/23/2021   Procedure: SPACE OAR INSTILLATION;  Surgeon: Watt Rush, MD;  Location: [facility];  Service: Urology;  Laterality: N/A;   TOTAL HIP ARTHROPLASTY  12/17/2011   Procedure: TOTAL HIP ARTHROPLASTY;  Surgeon: Fonda SHAUNNA Olmsted, MD;  Location: [facility];  Service: Orthopedics;  Laterality: Left;      Home Medications    Prior to Admission medications   Medication Sig Start Date End Date Taking? Authorizing Provider  adapalene  (DIFFERIN ) 0.1 % gel Apply 1 Application topically at bedtime. 03/31/23   Rush Lynwood ORN, MD  aspirin  EC 81 MG tablet Take 81 mg by mouth daily. Swallow whole.    [provider]  atorvastatin  (LIPITOR) 40 MG tablet Take 1 tablet (40 mg total) by mouth  daily. NEEDS APPOINTMENT FOR FURTHER REFILLS. 04/19/24   Rush Lynwood ORN, MD  benzoyl peroxide  10 % LIQD Apply 1 Application topically daily.    [provider]  bimatoprost  (LUMIGAN ) 0.01 % SOLN Place 1 drop into both eyes at bedtime.    [provider]  citalopram  (CELEXA ) 40 MG tablet Take 1 tablet (40 mg total) by mouth daily. 04/29/24   Rush Lynwood ORN, MD  clopidogrel  (PLAVIX ) 75 MG tablet Take 1 tablet by mouth once daily 05/31/24   Rush Lynwood ORN, MD  diphenhydrAMINE  (BENADRYL ) 25 mg capsule Take 50 mg by mouth every 6 (six) hours as needed for itching.     [provider]  dorzolamide-timolol  (COSOPT) 2-0.5 % ophthalmic solution 1 drop 2 (two) times daily. 04/25/23   [provider]  famotidine  (PEPCID ) 40 MG tablet Take 1 tablet (40 mg total) by mouth 2 (two) times daily. Take every morning and at bedtime 04/29/24   Rush Lynwood ORN, MD  fluticasone  (FLONASE ) 50 MCG/ACT nasal spray USE 2 SPRAYS DAILY IN BOTH NOSTRILS 04/29/24   Rush Lynwood ORN, MD  gabapentin  (NEURONTIN ) 100 MG capsule Take 2 capsules (200 mg total) by mouth 2 (two) times daily. 04/29/24   Rush Lynwood ORN, MD  hydrochlorothiazide  (MICROZIDE ) 12.5 MG capsule TAKE 1 CAPSULE BY MOUTH EVERY DAY 04/29/24   Rush Lynwood ORN, MD  ibuprofen (ADVIL) 200 MG tablet Take 400 mg by mouth every 6 (six) hours as needed for headache or moderate pain.    [provider]  imiquimod  (ALDARA ) 5 % cream Apply topically 3 (three) times a week. 04/30/24   Rush Lynwood ORN, MD  iron  polysaccharides (NU-IRON ) 150 MG capsule Take 1 capsule (150 mg total) by mouth daily. 04/29/24   Rush Lynwood ORN, MD  Multiple Vitamins-Minerals (ONE DAILY FOR MEN/LYCOPENE) TABS Take 1 tablet by mouth daily.    [provider]  naloxone [facility]) nasal spray 4 mg/0.1 mL Place 1 spray into the nose as needed (opioid overdose). 12/17/21   [provider]  nortriptyline  (PAMELOR ) 25 MG capsule Take 1 capsule (25 mg total) by mouth at  bedtime. 04/29/24   Rush Lynwood ORN, MD  omeprazole (PRILOSEC OTC) 20 MG tablet Take 20 mg by mouth daily.    [provider]  oxybutynin  (DITROPAN ) 5 MG tablet Take 1 tablet (5 mg total) by mouth at bedtime. 03/31/23   Rush Lynwood ORN, MD  oxyCODONE -acetaminophen  (PERCOCET) 10-325 MG tablet Take 1 tablet by mouth every 4 (four) hours as needed for pain. 08/15/22   Joshua Alm Hamilton, MD  pantoprazole  (PROTONIX ) 40 MG  tablet Take 1 tablet (40 mg total) by mouth daily. 04/29/24   Norleen Lynwood ORN, MD  sildenafil  (VIAGRA ) 100 MG tablet Take 1 tablet (100 mg total) by mouth as needed for erectile dysfunction. 04/29/24   Norleen Lynwood ORN, MD  tamsulosin  (FLOMAX ) 0.4 MG CAPS capsule Take 1 capsule (0.4 mg total) by mouth 2 (two) times daily. 04/29/24   Norleen Lynwood ORN, MD  timolol  (TIMOPTIC ) 0.5 % ophthalmic solution Place 1 drop into both eyes daily. 10/28/17   [provider]  tiZANidine  (ZANAFLEX ) 4 MG tablet Take 4 mg by mouth every 8 (eight) hours as needed for muscle spasms.  06/23/19   [provider]  tretinoin  (RETIN-A ) 0.1 % cream Apply 1 application  topically at bedtime. 04/29/24   Norleen Lynwood ORN, MD  zolpidem  (AMBIEN ) 10 MG tablet Take 1 tablet (10 mg total) by mouth at bedtime as needed. for sleep 03/31/23   Norleen Lynwood ORN, MD    Family History Family History  Problem Relation Age of Onset   Alcohol  abuse Father    Stroke Father    Heart disease Father    Arthritis Mother    Hyperlipidemia Mother    Goiter Mother        resection of benign goiter   Hypertension Sister    Diabetes Sister    Goiter Sister        resection of benign goiter   Cancer Sister        oldest.type unknown.   Diabetes Brother    Cancer Brother        oldest.type unknown.   Alcohol  abuse Brother    Alcohol  abuse Cousin    Heart disease Other        Aunt   Colon cancer Neg Hx    Colon polyps Neg Hx    Esophageal cancer Neg Hx    Rectal cancer Neg Hx    Stomach cancer Neg Hx    Breast cancer Neg  Hx    Prostate cancer Neg Hx    Pancreatic cancer Neg Hx     Social History Social History   Tobacco Use   Smoking status: Every Day    Current packs/day: 1.00    Average packs/day: 1 pack/day for 47.0 years (47.0 ttl pk-yrs)    Types: Cigarettes   Smokeless tobacco: Never   Tobacco comments:    since age 73  Vaping Use   Vaping status: Never Used  Substance Use Topics   Alcohol  use: No    Alcohol /week: 0.0 standard drinks of alcohol     Comment: 03-21-2021 pt stated hx of alcohol  abuse quit 2014   Drug use: Not Currently    Types: Crack cocaine    Comment: 03-21-2021 pt stated last use 12/2012     Allergies   Tramadol , Morphine  and codeine, and Oxycodone    Review of Systems Review of Systems  As per HPI  Physical Exam Triage Vital Signs ED Triage Vitals  Encounter Vitals Group     BP      Girls Systolic BP Percentile      Girls Diastolic BP Percentile      Boys Systolic BP Percentile      Boys Diastolic BP Percentile      Pulse      Resp      Temp      Temp src      SpO2      Weight      Height  Head Circumference      Peak Flow      Pain Score      Pain Loc      Pain Education      Exclude from Growth Chart    No data found.  Updated Vital Signs BP (!) 157/78 (BP Location: Left Arm)   Pulse 83   Temp 98.3 F (36.8 C) (Oral)   Resp 18   SpO2 96%   Physical Exam Vitals and nursing note reviewed.  Constitutional:      General: He is not in acute distress. HENT:     Mouth/Throat:     Mouth: Mucous membranes are moist.     Pharynx: Oropharynx is clear.  Eyes:     Extraocular Movements: Extraocular movements intact.     Conjunctiva/sclera: Conjunctivae normal.     Pupils: Pupils are equal, round, and reactive to light.  Cardiovascular:     Rate and Rhythm: Normal rate and regular rhythm.     Pulses:          Dorsalis pedis pulses are 2+ on the right side and 2+ on the left side.     Heart sounds: Normal heart sounds.  Pulmonary:      Effort: Pulmonary effort is normal.     Breath sounds: Normal breath sounds.  Musculoskeletal:        General: Normal range of motion.     Cervical back: Normal range of motion. No rigidity or tenderness.     Right lower leg: 1+ Pitting Edema present.     Left lower leg: 1+ Pitting Edema present.  Feet:     Right foot:     Skin integrity: Skin integrity normal.     Toenail Condition: Right toenails are abnormally thick.     Left foot:     Skin integrity: Skin integrity normal.     Toenail Condition: Left toenails are abnormally thick.     Comments: Bilateral feet without abrasion, ulceration, wound, erythema or other injury.  Skin:    General: Skin is warm and dry.  Neurological:     General: No focal deficit present.     Mental Status: He is alert and oriented to person, place, and time.     Cranial Nerves: No cranial nerve deficit.     Sensory: Sensation is intact.     Motor: Motor function is intact.     Coordination: Coordination is intact.     Comments: Upper and lower extremities strength 5/5. Reports equal and intact sensation throughout. There is no sensation deficit in the legs, feet, toes. DP pulses are 2+. Cap refill toes < 2 seconds. Full ROM toes, ankles, knees.      UC Treatments / Results  Labs (all labs ordered are listed, but only abnormal results are displayed) Labs Reviewed  POCT FASTING CBG [facility] MANUAL ENTRY - Abnormal; Notable for the following components:      Result Value   POCT Glucose ([facility]) 106 (*)    All other components within normal limits    EKG  Radiology No results found.  Procedures Procedures  Medications Ordered in UC Medications - No data to display  Initial Impression / Assessment and Plan / UC Course  I have reviewed the triage vital signs and the nursing notes.  Pertinent labs & imaging results that were available during my care of the patient were reviewed by me and considered in my medical decision making (see chart for  details).  Discussion with  patient that as this is chronic issue, he has pain management contract, and sees a spinal specialist, there is nothing further that an urgent care can provide for him. I have recommended to check a CBG to rule out DM as contributing to neuropathy. CBG 106 Otherwise the radiation into legs with foot numbness likely is related to his chronic back pain, consider sciatica flare. I have recommended he start back on his gabapentin . He has prescription for 200 mg twice daily. Have advised call his spinal specialist today to discuss the symptoms and set up a follow up appointment.  Agrees to plan  Final Clinical Impressions(s) / UC Diagnoses   Final diagnoses:  Neuropathy  Chronic bilateral low back pain with bilateral sciatica  Lumbar radiculopathy     Discharge Instructions      Please start taking your gabapentin  that has been previously prescribed. This should be beneficial for the numbness you are feeling in the feet. Continue other prescribed pain medicines as directed. Please call your spine specialist today to make a follow up appointment.  Please go to the emergency department if symptoms become severe.     ED Prescriptions   None    I have reviewed the PDMP during this encounter.   Jeryl Stabs, PA-C 07/02/24 1510

## 2024-07-08 NOTE — Telephone Encounter (Signed)
 Called pt and he states he already got this handled and will be seeing neurosurgeon

## 2024-09-11 ENCOUNTER — Other Ambulatory Visit: Payer: Self-pay | Admitting: Internal Medicine

## 2024-09-13 ENCOUNTER — Other Ambulatory Visit: Payer: Self-pay

## 2024-09-27 ENCOUNTER — Telehealth: Payer: Self-pay | Admitting: Pharmacist

## 2024-09-27 DIAGNOSIS — E7800 Pure hypercholesterolemia, unspecified: Secondary | ICD-10-CM

## 2024-09-27 MED ORDER — ATORVASTATIN CALCIUM 40 MG PO TABS: 40.0000 mg | ORAL_TABLET | Freq: Every day | ORAL | 2 refills | Status: AC

## 2024-09-27 NOTE — Telephone Encounter (Signed)
 Pharmacy Quality Measure Review  This patient is appearing on a report for being at risk of failing the adherence measure for cholesterol (statin) medications this calendar year.   Medication: Atorvastatin  Last fill date: 04/19/24 for 90 day supply  No refills remaining on atorvastatin  Rx, pt did see PCP in July 2025. Will send refill.  Darrelyn Drum, PharmD, BCPS, CPP Clinical Pharmacist Practitioner [facility] [phone]

## 2024-10-21 ENCOUNTER — Other Ambulatory Visit: Payer: Self-pay | Admitting: Internal Medicine

## 2024-10-21 ENCOUNTER — Other Ambulatory Visit: Payer: Self-pay

## 2025-06-23 ENCOUNTER — Ambulatory Visit
